# Patient Record
Sex: Male | Born: 1960
Health system: Southern US, Community
[De-identification: ages and names within clinical notes are randomized; demographics above are authoritative.]

## PROBLEM LIST (undated history)

## (undated) ENCOUNTER — Emergency Department: Admission: EM | Payer: Medicare Other

## (undated) DIAGNOSIS — F32A Depression, unspecified: Secondary | ICD-10-CM

## (undated) DIAGNOSIS — I10 Essential (primary) hypertension: Secondary | ICD-10-CM

## (undated) DIAGNOSIS — I639 Cerebral infarction, unspecified: Secondary | ICD-10-CM

## (undated) DIAGNOSIS — F1011 Alcohol abuse, in remission: Secondary | ICD-10-CM

## (undated) DIAGNOSIS — E669 Obesity, unspecified: Secondary | ICD-10-CM

## (undated) DIAGNOSIS — K760 Fatty (change of) liver, not elsewhere classified: Secondary | ICD-10-CM

## (undated) DIAGNOSIS — F329 Major depressive disorder, single episode, unspecified: Secondary | ICD-10-CM

## (undated) DIAGNOSIS — Z809 Family history of malignant neoplasm, unspecified: Secondary | ICD-10-CM

## (undated) DIAGNOSIS — I2699 Other pulmonary embolism without acute cor pulmonale: Secondary | ICD-10-CM

## (undated) DIAGNOSIS — Z720 Tobacco use: Secondary | ICD-10-CM

## (undated) DIAGNOSIS — D75839 Thrombocytosis, unspecified: Secondary | ICD-10-CM

## (undated) DIAGNOSIS — R591 Generalized enlarged lymph nodes: Secondary | ICD-10-CM

## (undated) DIAGNOSIS — D689 Coagulation defect, unspecified: Secondary | ICD-10-CM

## (undated) DIAGNOSIS — Z86718 Personal history of other venous thrombosis and embolism: Secondary | ICD-10-CM

## (undated) DIAGNOSIS — M1990 Unspecified osteoarthritis, unspecified site: Secondary | ICD-10-CM

## (undated) DIAGNOSIS — M1712 Unilateral primary osteoarthritis, left knee: Secondary | ICD-10-CM

## (undated) DIAGNOSIS — D696 Thrombocytopenia, unspecified: Secondary | ICD-10-CM

## (undated) DIAGNOSIS — Z7901 Long term (current) use of anticoagulants: Secondary | ICD-10-CM

## (undated) DIAGNOSIS — I6529 Occlusion and stenosis of unspecified carotid artery: Secondary | ICD-10-CM

## (undated) DIAGNOSIS — F10929 Alcohol use, unspecified with intoxication, unspecified: Secondary | ICD-10-CM

## (undated) DIAGNOSIS — F419 Anxiety disorder, unspecified: Secondary | ICD-10-CM

## (undated) DIAGNOSIS — Z87820 Personal history of traumatic brain injury: Secondary | ICD-10-CM

## (undated) DIAGNOSIS — R748 Abnormal levels of other serum enzymes: Secondary | ICD-10-CM

## (undated) DIAGNOSIS — N2889 Other specified disorders of kidney and ureter: Secondary | ICD-10-CM

## (undated) DIAGNOSIS — J449 Chronic obstructive pulmonary disease, unspecified: Secondary | ICD-10-CM

## (undated) DIAGNOSIS — F1911 Other psychoactive substance abuse, in remission: Secondary | ICD-10-CM

## (undated) DIAGNOSIS — C649 Malignant neoplasm of unspecified kidney, except renal pelvis: Secondary | ICD-10-CM

## (undated) DIAGNOSIS — K759 Inflammatory liver disease, unspecified: Secondary | ICD-10-CM

## (undated) DIAGNOSIS — S066XAA Traumatic subarachnoid hemorrhage with loss of consciousness status unknown, initial encounter: Secondary | ICD-10-CM

## (undated) DIAGNOSIS — C801 Malignant (primary) neoplasm, unspecified: Secondary | ICD-10-CM

## (undated) DIAGNOSIS — R7303 Prediabetes: Secondary | ICD-10-CM

## (undated) HISTORY — PX: ANKLE SURGERY: SHX546

## (undated) HISTORY — PX: ORBITAL FRACTURE SURGERY: SHX725

## (undated) HISTORY — DX: Essential (primary) hypertension: I10

## (undated) HISTORY — DX: Personal history of other venous thrombosis and embolism: Z86.718

## (undated) HISTORY — DX: Other specified disorders of kidney and ureter: N28.89

## (undated) HISTORY — DX: Thrombocytopenia, unspecified: D69.6

## (undated) HISTORY — DX: Generalized enlarged lymph nodes: R59.1

## (undated) HISTORY — DX: Obesity, unspecified: E66.9

## (undated) HISTORY — DX: Other pulmonary embolism without acute cor pulmonale: I26.99

## (undated) HISTORY — DX: Abnormal levels of other serum enzymes: R74.8

## (undated) HISTORY — DX: Traumatic subarachnoid hemorrhage with loss of consciousness status unknown, initial encounter: S06.6XAA

## (undated) HISTORY — DX: Alcohol use, unspecified with intoxication, unspecified: F10.929

## (undated) HISTORY — DX: Long term (current) use of anticoagulants: Z79.01

## (undated) HISTORY — DX: Unilateral primary osteoarthritis, left knee: M17.12

## (undated) HISTORY — DX: Occlusion and stenosis of unspecified carotid artery: I65.29

## (undated) HISTORY — DX: Fatty (change of) liver, not elsewhere classified: K76.0

## (undated) HISTORY — DX: Tobacco use: Z72.0

## (undated) HISTORY — DX: Family history of malignant neoplasm, unspecified: Z80.9

## (undated) HISTORY — DX: Personal history of traumatic brain injury: Z87.820

## (undated) HISTORY — PX: HERNIA REPAIR: SHX51

---

## 2009-03-30 ENCOUNTER — Ambulatory Visit: Payer: Self-pay | Admitting: Gastroenterology

## 2012-12-28 HISTORY — PX: ANKLE FRACTURE SURGERY: SHX122

## 2013-01-09 ENCOUNTER — Inpatient Hospital Stay: Payer: Self-pay | Admitting: Orthopedic Surgery

## 2013-01-09 LAB — BASIC METABOLIC PANEL
Anion Gap: 10 (ref 7–16)
BUN: 17 mg/dL (ref 7–18)
Calcium, Total: 8 mg/dL — ABNORMAL LOW (ref 8.5–10.1)
Chloride: 106 mmol/L (ref 98–107)
Co2: 23 mmol/L (ref 21–32)
Creatinine: 1.09 mg/dL (ref 0.60–1.30)
EGFR (African American): >60
EGFR (Non-African Amer.): >60
Glucose: 106 mg/dL — ABNORMAL HIGH (ref 65–99)
Osmolality: 280 (ref 275–301)
Potassium: 3.8 mmol/L (ref 3.5–5.1)
Sodium: 139 mmol/L (ref 136–145)

## 2013-01-09 LAB — DRUG SCREEN, URINE
Amphetamines, Ur Screen: NEGATIVE (ref ?–1000)
Barbiturates, Ur Screen: NEGATIVE (ref ?–200)
Benzodiazepine, Ur Scrn: NEGATIVE (ref ?–200)
Cannabinoid 50 Ng, Ur ~~LOC~~: NEGATIVE (ref ?–50)
Cocaine Metabolite,Ur ~~LOC~~: POSITIVE (ref ?–300)
MDMA (Ecstasy)Ur Screen: NEGATIVE (ref ?–500)
Methadone, Ur Screen: NEGATIVE (ref ?–300)
Opiate, Ur Screen: POSITIVE (ref ?–300)
Phencyclidine (PCP) Ur S: NEGATIVE (ref ?–25)
Tricyclic, Ur Screen: NEGATIVE (ref ?–1000)

## 2013-01-09 LAB — CBC WITH DIFFERENTIAL/PLATELET
Basophil #: 0.1 10*3/uL (ref 0.0–0.1)
Basophil %: 0.4 %
Eosinophil #: 0 10*3/uL (ref 0.0–0.7)
Eosinophil %: 0.4 %
HCT: 45.6 % (ref 40.0–52.0)
HGB: 15.2 g/dL (ref 13.0–18.0)
Lymphocyte #: 1.7 10*3/uL (ref 1.0–3.6)
Lymphocyte %: 13.5 %
MCH: 31.9 pg (ref 26.0–34.0)
MCHC: 33.4 g/dL (ref 32.0–36.0)
MCV: 96 fL (ref 80–100)
Monocyte #: 0.7 x10 3/mm (ref 0.2–1.0)
Monocyte %: 5.3 %
Neutrophil #: 10 10*3/uL — ABNORMAL HIGH (ref 1.4–6.5)
Neutrophil %: 80.4 %
Platelet: 186 10*3/uL (ref 150–440)
RBC: 4.77 10*6/uL (ref 4.40–5.90)
RDW: 12.2 % (ref 11.5–14.5)
WBC: 12.4 10*3/uL — ABNORMAL HIGH (ref 3.8–10.6)

## 2013-01-09 LAB — URINALYSIS, COMPLETE
Bacteria: NONE SEEN
Bilirubin,UR: NEGATIVE
Blood: NEGATIVE
Glucose,UR: NEGATIVE mg/dL (ref 0–75)
Hyaline Cast: 3
Leukocyte Esterase: NEGATIVE
Nitrite: NEGATIVE
Ph: 5 (ref 4.5–8.0)
Protein: NEGATIVE
RBC,UR: <1 /HPF (ref 0–5)
Specific Gravity: 1.017 (ref 1.003–1.030)
Squamous Epithelial: NONE SEEN
WBC UR: <1 /HPF (ref 0–5)

## 2013-01-09 LAB — APTT: Activated PTT: 29.7 secs (ref 23.6–35.9)

## 2013-01-09 LAB — PROTIME-INR
INR: 1
Prothrombin Time: 13.4 secs (ref 11.5–14.7)

## 2013-02-12 ENCOUNTER — Inpatient Hospital Stay: Payer: Self-pay | Admitting: Internal Medicine

## 2013-02-12 LAB — COMPREHENSIVE METABOLIC PANEL
Albumin: 3.6 g/dL (ref 3.4–5.0)
Alkaline Phosphatase: 112 U/L (ref 50–136)
Anion Gap: 6 — ABNORMAL LOW (ref 7–16)
BUN: 12 mg/dL (ref 7–18)
Bilirubin,Total: 2.5 mg/dL — ABNORMAL HIGH (ref 0.2–1.0)
Calcium, Total: 8.8 mg/dL (ref 8.5–10.1)
Chloride: 104 mmol/L (ref 98–107)
Co2: 25 mmol/L (ref 21–32)
Creatinine: 1.04 mg/dL (ref 0.60–1.30)
EGFR (African American): >60
EGFR (Non-African Amer.): >60
Glucose: 121 mg/dL — ABNORMAL HIGH (ref 65–99)
Osmolality: 271 (ref 275–301)
Potassium: 3.7 mmol/L (ref 3.5–5.1)
SGOT(AST): 14 U/L — ABNORMAL LOW (ref 15–37)
SGPT (ALT): 32 U/L (ref 12–78)
Sodium: 135 mmol/L — ABNORMAL LOW (ref 136–145)
Total Protein: 7.6 g/dL (ref 6.4–8.2)

## 2013-02-12 LAB — CBC
HCT: 45.3 % (ref 40.0–52.0)
HGB: 15.5 g/dL (ref 13.0–18.0)
MCH: 31.7 pg (ref 26.0–34.0)
MCHC: 34.1 g/dL (ref 32.0–36.0)
MCV: 93 fL (ref 80–100)
Platelet: 142 10*3/uL — ABNORMAL LOW (ref 150–440)
RBC: 4.88 10*6/uL (ref 4.40–5.90)
RDW: 12.5 % (ref 11.5–14.5)
WBC: 13.9 10*3/uL — ABNORMAL HIGH (ref 3.8–10.6)

## 2013-02-12 LAB — PROTIME-INR
INR: 1.1
Prothrombin Time: 14 secs (ref 11.5–14.7)

## 2013-02-12 LAB — TROPONIN I: Troponin-I: <0.02 ng/mL

## 2013-02-12 LAB — APTT: Activated PTT: 33.7 secs (ref 23.6–35.9)

## 2013-02-13 LAB — CBC WITH DIFFERENTIAL/PLATELET
Basophil #: 0 10*3/uL (ref 0.0–0.1)
Basophil %: 0.3 %
Eosinophil #: 0.1 10*3/uL (ref 0.0–0.7)
Eosinophil %: 1.1 %
HCT: 41.4 % (ref 40.0–52.0)
HGB: 14.5 g/dL (ref 13.0–18.0)
Lymphocyte #: 1.6 10*3/uL (ref 1.0–3.6)
Lymphocyte %: 15.9 %
MCH: 32.8 pg (ref 26.0–34.0)
MCHC: 34.9 g/dL (ref 32.0–36.0)
MCV: 94 fL (ref 80–100)
Monocyte #: 1 x10 3/mm (ref 0.2–1.0)
Monocyte %: 10.2 %
Neutrophil #: 7.4 10*3/uL — ABNORMAL HIGH (ref 1.4–6.5)
Neutrophil %: 72.5 %
Platelet: 119 10*3/uL — ABNORMAL LOW (ref 150–440)
RBC: 4.41 10*6/uL (ref 4.40–5.90)
RDW: 12.4 % (ref 11.5–14.5)
WBC: 10.2 10*3/uL (ref 3.8–10.6)

## 2013-02-13 LAB — PROTIME-INR
INR: 1.1
Prothrombin Time: 14.4 secs (ref 11.5–14.7)

## 2013-02-13 LAB — BASIC METABOLIC PANEL
Anion Gap: 7 (ref 7–16)
BUN: 13 mg/dL (ref 7–18)
Calcium, Total: 8.9 mg/dL (ref 8.5–10.1)
Chloride: 99 mmol/L (ref 98–107)
Co2: 26 mmol/L (ref 21–32)
Creatinine: 0.93 mg/dL (ref 0.60–1.30)
Glucose: 97 mg/dL (ref 65–99)
Osmolality: 265 (ref 275–301)
Potassium: 3.6 mmol/L (ref 3.5–5.1)
Sodium: 132 mmol/L — ABNORMAL LOW (ref 136–145)

## 2013-02-14 LAB — PLATELET COUNT: Platelet: 119 10*3/uL — ABNORMAL LOW (ref 150–440)

## 2013-02-14 LAB — PROTIME-INR
INR: 1.2
Prothrombin Time: 14.9 secs — ABNORMAL HIGH (ref 11.5–14.7)

## 2013-02-15 LAB — PROTIME-INR
INR: 1.6
Prothrombin Time: 18.8 secs — ABNORMAL HIGH (ref 11.5–14.7)

## 2013-02-16 LAB — PROTIME-INR
INR: 2.1
Prothrombin Time: 23.5 secs — ABNORMAL HIGH (ref 11.5–14.7)

## 2013-02-16 LAB — HEMOGLOBIN: HGB: 14 g/dL (ref 13.0–18.0)

## 2013-02-18 LAB — CULTURE, BLOOD (SINGLE)

## 2013-02-23 ENCOUNTER — Emergency Department: Payer: Self-pay | Admitting: Emergency Medicine

## 2013-02-23 LAB — COMPREHENSIVE METABOLIC PANEL
Albumin: 3.4 g/dL (ref 3.4–5.0)
Alkaline Phosphatase: 96 U/L (ref 50–136)
Anion Gap: 6 — ABNORMAL LOW (ref 7–16)
BUN: 11 mg/dL (ref 7–18)
Bilirubin,Total: 0.2 mg/dL (ref 0.2–1.0)
Calcium, Total: 9.3 mg/dL (ref 8.5–10.1)
Chloride: 108 mmol/L — ABNORMAL HIGH (ref 98–107)
Co2: 25 mmol/L (ref 21–32)
Creatinine: 0.81 mg/dL (ref 0.60–1.30)
EGFR (African American): >60
EGFR (Non-African Amer.): >60
Glucose: 105 mg/dL — ABNORMAL HIGH (ref 65–99)
Osmolality: 277 (ref 275–301)
Potassium: 4.4 mmol/L (ref 3.5–5.1)
SGOT(AST): 24 U/L (ref 15–37)
SGPT (ALT): 55 U/L (ref 12–78)
Sodium: 139 mmol/L (ref 136–145)
Total Protein: 8 g/dL (ref 6.4–8.2)

## 2013-02-23 LAB — CBC WITH DIFFERENTIAL/PLATELET
Basophil #: 0.1 10*3/uL (ref 0.0–0.1)
Basophil %: 1.1 %
Eosinophil #: 0.2 10*3/uL (ref 0.0–0.7)
Eosinophil %: 2.8 %
HCT: 45.6 % (ref 40.0–52.0)
HGB: 15.7 g/dL (ref 13.0–18.0)
Lymphocyte #: 1.7 10*3/uL (ref 1.0–3.6)
Lymphocyte %: 24.7 %
MCH: 31.8 pg (ref 26.0–34.0)
MCHC: 34.3 g/dL (ref 32.0–36.0)
MCV: 93 fL (ref 80–100)
Monocyte #: 0.4 x10 3/mm (ref 0.2–1.0)
Monocyte %: 5.9 %
Neutrophil #: 4.6 10*3/uL (ref 1.4–6.5)
Neutrophil %: 65.5 %
Platelet: 423 10*3/uL (ref 150–440)
RBC: 4.93 10*6/uL (ref 4.40–5.90)
RDW: 12.7 % (ref 11.5–14.5)
WBC: 7.1 10*3/uL (ref 3.8–10.6)

## 2013-02-23 LAB — PROTIME-INR
INR: 4.5
Prothrombin Time: 40.7 secs — ABNORMAL HIGH (ref 11.5–14.7)

## 2013-02-23 LAB — APTT: Activated PTT: 71.5 secs — ABNORMAL HIGH (ref 23.6–35.9)

## 2013-03-03 ENCOUNTER — Ambulatory Visit: Payer: Self-pay

## 2013-06-29 DIAGNOSIS — N2889 Other specified disorders of kidney and ureter: Secondary | ICD-10-CM

## 2013-06-29 DIAGNOSIS — I2699 Other pulmonary embolism without acute cor pulmonale: Secondary | ICD-10-CM

## 2013-06-29 DIAGNOSIS — Z86718 Personal history of other venous thrombosis and embolism: Secondary | ICD-10-CM

## 2013-06-29 HISTORY — DX: Other specified disorders of kidney and ureter: N28.89

## 2013-06-29 HISTORY — DX: Other pulmonary embolism without acute cor pulmonale: I26.99

## 2013-06-29 HISTORY — DX: Personal history of other venous thrombosis and embolism: Z86.718

## 2013-06-29 HISTORY — PX: ROBOTIC ASSITED PARTIAL NEPHRECTOMY: SHX6087

## 2013-07-24 ENCOUNTER — Emergency Department: Payer: Self-pay | Admitting: Emergency Medicine

## 2013-07-24 LAB — CBC WITH DIFFERENTIAL/PLATELET
Basophil #: 0 10*3/uL (ref 0.0–0.1)
Basophil %: 0.6 %
Eosinophil #: 0.1 10*3/uL (ref 0.0–0.7)
Eosinophil %: 1.5 %
HCT: 41.2 % (ref 40.0–52.0)
HGB: 14.4 g/dL (ref 13.0–18.0)
Lymphocyte #: 1.5 10*3/uL (ref 1.0–3.6)
Lymphocyte %: 17.8 %
MCH: 33.4 pg (ref 26.0–34.0)
MCHC: 35 g/dL (ref 32.0–36.0)
MCV: 95 fL (ref 80–100)
Monocyte #: 0.6 x10 3/mm (ref 0.2–1.0)
Monocyte %: 7.8 %
Neutrophil #: 5.9 10*3/uL (ref 1.4–6.5)
Neutrophil %: 72.3 %
Platelet: 137 10*3/uL — ABNORMAL LOW (ref 150–440)
RBC: 4.32 10*6/uL — ABNORMAL LOW (ref 4.40–5.90)
RDW: 12.1 % (ref 11.5–14.5)
WBC: 8.2 10*3/uL (ref 3.8–10.6)

## 2013-07-24 LAB — COMPREHENSIVE METABOLIC PANEL
Albumin: 3.1 g/dL — ABNORMAL LOW (ref 3.4–5.0)
Alkaline Phosphatase: 70 U/L (ref 50–136)
Anion Gap: 8 (ref 7–16)
BUN: 12 mg/dL (ref 7–18)
Bilirubin,Total: 0.9 mg/dL (ref 0.2–1.0)
Calcium, Total: 8.5 mg/dL (ref 8.5–10.1)
Chloride: 103 mmol/L (ref 98–107)
Co2: 25 mmol/L (ref 21–32)
Creatinine: 1.29 mg/dL (ref 0.60–1.30)
EGFR (African American): >60
EGFR (Non-African Amer.): >60
Glucose: 211 mg/dL — ABNORMAL HIGH (ref 65–99)
Osmolality: 278 (ref 275–301)
Potassium: 3.8 mmol/L (ref 3.5–5.1)
SGOT(AST): 59 U/L — ABNORMAL HIGH (ref 15–37)
SGPT (ALT): 62 U/L (ref 12–78)
Sodium: 136 mmol/L (ref 136–145)
Total Protein: 6.9 g/dL (ref 6.4–8.2)

## 2013-07-24 LAB — PROTIME-INR
INR: 1
Prothrombin Time: 13.2 secs (ref 11.5–14.7)

## 2013-08-09 ENCOUNTER — Emergency Department: Payer: Self-pay | Admitting: Emergency Medicine

## 2013-08-09 LAB — COMPREHENSIVE METABOLIC PANEL
Albumin: 3.6 g/dL (ref 3.4–5.0)
Alkaline Phosphatase: 90 U/L (ref 50–136)
Anion Gap: 9 (ref 7–16)
BUN: 13 mg/dL (ref 7–18)
Bilirubin,Total: 0.5 mg/dL (ref 0.2–1.0)
Calcium, Total: 8.5 mg/dL (ref 8.5–10.1)
Chloride: 106 mmol/L (ref 98–107)
Co2: 24 mmol/L (ref 21–32)
Creatinine: 1.04 mg/dL (ref 0.60–1.30)
EGFR (African American): >60
EGFR (Non-African Amer.): >60
Glucose: 179 mg/dL — ABNORMAL HIGH (ref 65–99)
Osmolality: 282 (ref 275–301)
Potassium: 3.7 mmol/L (ref 3.5–5.1)
SGOT(AST): 42 U/L — ABNORMAL HIGH (ref 15–37)
SGPT (ALT): 63 U/L (ref 12–78)
Sodium: 139 mmol/L (ref 136–145)
Total Protein: 7.5 g/dL (ref 6.4–8.2)

## 2013-08-09 LAB — TROPONIN I: Troponin-I: <0.02 ng/mL

## 2013-08-09 LAB — CK TOTAL AND CKMB (NOT AT ARMC)
CK, Total: 41 U/L (ref 35–232)
CK-MB: 1 ng/mL (ref 0.5–3.6)

## 2013-08-09 LAB — PROTIME-INR
INR: 1
Prothrombin Time: 13.3 secs (ref 11.5–14.7)

## 2013-08-09 LAB — CBC
HCT: 45.6 % (ref 40.0–52.0)
HGB: 15.6 g/dL (ref 13.0–18.0)
MCH: 32.6 pg (ref 26.0–34.0)
MCHC: 34.3 g/dL (ref 32.0–36.0)
MCV: 95 fL (ref 80–100)
Platelet: 207 10*3/uL (ref 150–440)
RBC: 4.8 10*6/uL (ref 4.40–5.90)
RDW: 12.1 % (ref 11.5–14.5)
WBC: 7.9 10*3/uL (ref 3.8–10.6)

## 2013-08-09 LAB — ETHANOL
Ethanol %: 0.168 % — ABNORMAL HIGH (ref 0.000–0.080)
Ethanol: 168 mg/dL

## 2013-08-09 LAB — PRO B NATRIURETIC PEPTIDE: B-Type Natriuretic Peptide: 67 pg/mL (ref 0–125)

## 2013-08-09 LAB — APTT: Activated PTT: 29.7 secs (ref 23.6–35.9)

## 2013-08-10 LAB — TROPONIN I: Troponin-I: <0.02 ng/mL

## 2013-08-14 ENCOUNTER — Emergency Department: Payer: Self-pay | Admitting: Emergency Medicine

## 2013-08-15 LAB — DRUG SCREEN, URINE

## 2013-08-15 LAB — COMPREHENSIVE METABOLIC PANEL
Albumin: 3.5 g/dL (ref 3.4–5.0)
Alkaline Phosphatase: 92 U/L (ref 50–136)
Anion Gap: 11 (ref 7–16)
BUN: 16 mg/dL (ref 7–18)
Bilirubin,Total: 0.5 mg/dL (ref 0.2–1.0)
Calcium, Total: 8.5 mg/dL (ref 8.5–10.1)
Chloride: 105 mmol/L (ref 98–107)
Co2: 24 mmol/L (ref 21–32)
Creatinine: 1.06 mg/dL (ref 0.60–1.30)
EGFR (African American): >60
EGFR (Non-African Amer.): >60
Glucose: 110 mg/dL — ABNORMAL HIGH (ref 65–99)
Osmolality: 281 (ref 275–301)
Potassium: 3.7 mmol/L (ref 3.5–5.1)
SGOT(AST): 37 U/L (ref 15–37)
SGPT (ALT): 58 U/L (ref 12–78)
Sodium: 140 mmol/L (ref 136–145)
Total Protein: 7.3 g/dL (ref 6.4–8.2)

## 2013-08-15 LAB — ACETAMINOPHEN LEVEL: Acetaminophen: <2 ug/mL

## 2013-08-15 LAB — CBC
HCT: 43.8 % (ref 40.0–52.0)
HGB: 15.2 g/dL (ref 13.0–18.0)
MCH: 33.1 pg (ref 26.0–34.0)
MCHC: 34.7 g/dL (ref 32.0–36.0)
MCV: 95 fL (ref 80–100)
Platelet: 162 10*3/uL (ref 150–440)
RBC: 4.6 10*6/uL (ref 4.40–5.90)
RDW: 12.2 % (ref 11.5–14.5)
WBC: 4.9 10*3/uL (ref 3.8–10.6)

## 2013-08-15 LAB — URINALYSIS, COMPLETE
Bacteria: NONE SEEN
Bilirubin,UR: NEGATIVE
Glucose,UR: NEGATIVE mg/dL (ref 0–75)
Ketone: NEGATIVE
Leukocyte Esterase: NEGATIVE
Nitrite: NEGATIVE
Ph: 5 (ref 4.5–8.0)
Protein: NEGATIVE
RBC,UR: <1 /HPF (ref 0–5)
Specific Gravity: 1.008 (ref 1.003–1.030)
Squamous Epithelial: NONE SEEN
WBC UR: <1 /HPF (ref 0–5)

## 2013-08-15 LAB — ETHANOL
Ethanol %: 0.132 % — ABNORMAL HIGH (ref 0.000–0.080)
Ethanol: 132 mg/dL

## 2013-08-15 LAB — PROTIME-INR
INR: 1
Prothrombin Time: 13.2 secs (ref 11.5–14.7)

## 2013-08-15 LAB — TSH: Thyroid Stimulating Horm: 4.55 u[IU]/mL — ABNORMAL HIGH

## 2013-08-15 LAB — SALICYLATE LEVEL: Salicylates, Serum: 2.6 mg/dL

## 2013-11-27 DIAGNOSIS — R748 Abnormal levels of other serum enzymes: Secondary | ICD-10-CM

## 2013-11-27 HISTORY — DX: Abnormal levels of other serum enzymes: R74.8

## 2013-12-07 ENCOUNTER — Inpatient Hospital Stay: Payer: Self-pay | Admitting: Internal Medicine

## 2013-12-07 LAB — CBC
HCT: 46.9 % (ref 40.0–52.0)
HGB: 15.6 g/dL (ref 13.0–18.0)
MCH: 32.2 pg (ref 26.0–34.0)
MCHC: 33.3 g/dL (ref 32.0–36.0)
MCV: 97 fL (ref 80–100)
Platelet: 161 10*3/uL (ref 150–440)
RBC: 4.85 10*6/uL (ref 4.40–5.90)
RDW: 12.3 % (ref 11.5–14.5)
WBC: 7.6 10*3/uL (ref 3.8–10.6)

## 2013-12-07 LAB — BASIC METABOLIC PANEL
Anion Gap: 12 (ref 7–16)
BUN: 8 mg/dL (ref 7–18)
Calcium, Total: 8.6 mg/dL (ref 8.5–10.1)
Chloride: 101 mmol/L (ref 98–107)
Co2: 24 mmol/L (ref 21–32)
Creatinine: 1.08 mg/dL (ref 0.60–1.30)
EGFR (African American): >60
EGFR (Non-African Amer.): >60
Glucose: 122 mg/dL — ABNORMAL HIGH (ref 65–99)
Osmolality: 273 (ref 275–301)
Potassium: 3.5 mmol/L (ref 3.5–5.1)
Sodium: 137 mmol/L (ref 136–145)

## 2013-12-07 LAB — APTT: Activated PTT: 31.9 secs (ref 23.6–35.9)

## 2013-12-07 LAB — TROPONIN I: Troponin-I: <0.02 ng/mL

## 2013-12-08 LAB — CBC WITH DIFFERENTIAL/PLATELET
Basophil #: 0 10*3/uL (ref 0.0–0.1)
Basophil %: 0.7 %
Eosinophil #: 0.2 10*3/uL (ref 0.0–0.7)
Eosinophil %: 3.5 %
HCT: 42.3 % (ref 40.0–52.0)
HGB: 14.3 g/dL (ref 13.0–18.0)
Lymphocyte #: 1.4 10*3/uL (ref 1.0–3.6)
Lymphocyte %: 27.6 %
MCH: 32.7 pg (ref 26.0–34.0)
MCHC: 33.8 g/dL (ref 32.0–36.0)
MCV: 97 fL (ref 80–100)
Monocyte #: 0.4 x10 3/mm (ref 0.2–1.0)
Monocyte %: 7.3 %
Neutrophil #: 3.2 10*3/uL (ref 1.4–6.5)
Neutrophil %: 60.9 %
Platelet: 117 10*3/uL — ABNORMAL LOW (ref 150–440)
RBC: 4.37 10*6/uL — ABNORMAL LOW (ref 4.40–5.90)
RDW: 11.9 % (ref 11.5–14.5)
WBC: 5.2 10*3/uL (ref 3.8–10.6)

## 2013-12-08 LAB — BASIC METABOLIC PANEL
Anion Gap: 6 — ABNORMAL LOW (ref 7–16)
BUN: 9 mg/dL (ref 7–18)
Calcium, Total: 7.5 mg/dL — ABNORMAL LOW (ref 8.5–10.1)
Chloride: 103 mmol/L (ref 98–107)
Co2: 26 mmol/L (ref 21–32)
Creatinine: 0.98 mg/dL (ref 0.60–1.30)
EGFR (African American): >60
EGFR (Non-African Amer.): >60
Glucose: 81 mg/dL (ref 65–99)
Osmolality: 268 (ref 275–301)
Potassium: 3.7 mmol/L (ref 3.5–5.1)
Sodium: 135 mmol/L — ABNORMAL LOW (ref 136–145)

## 2013-12-08 LAB — CK TOTAL AND CKMB (NOT AT ARMC)
CK, Total: 31 U/L — ABNORMAL LOW
CK, Total: 33 U/L — ABNORMAL LOW
CK-MB: 0.7 ng/mL (ref 0.5–3.6)
CK-MB: 0.6 ng/mL (ref 0.5–3.6)

## 2013-12-08 LAB — CK-MB: CK-MB: 0.6 ng/mL (ref 0.5–3.6)

## 2013-12-08 LAB — TROPONIN I
Troponin-I: <0.02 ng/mL
Troponin-I: <0.02 ng/mL
Troponin-I: <0.02 ng/mL

## 2013-12-08 LAB — APTT
Activated PTT: 58.9 secs — ABNORMAL HIGH (ref 23.6–35.9)
Activated PTT: 61.8 secs — ABNORMAL HIGH (ref 23.6–35.9)

## 2014-01-02 ENCOUNTER — Ambulatory Visit: Payer: Self-pay | Admitting: Urgent Care

## 2014-02-08 ENCOUNTER — Emergency Department: Payer: Self-pay | Admitting: Emergency Medicine

## 2014-02-09 ENCOUNTER — Emergency Department: Payer: Self-pay | Admitting: Emergency Medicine

## 2014-02-22 ENCOUNTER — Ambulatory Visit: Payer: Self-pay | Admitting: Family Medicine

## 2014-03-12 ENCOUNTER — Emergency Department: Payer: Self-pay | Admitting: Emergency Medicine

## 2014-03-12 LAB — COMPREHENSIVE METABOLIC PANEL
Albumin: 3.3 g/dL — ABNORMAL LOW (ref 3.4–5.0)
Alkaline Phosphatase: 63 U/L
Anion Gap: 6 — ABNORMAL LOW (ref 7–16)
BUN: 18 mg/dL (ref 7–18)
Bilirubin,Total: 1 mg/dL (ref 0.2–1.0)
Calcium, Total: 8.3 mg/dL — ABNORMAL LOW (ref 8.5–10.1)
Chloride: 105 mmol/L (ref 98–107)
Co2: 24 mmol/L (ref 21–32)
Creatinine: 1.04 mg/dL (ref 0.60–1.30)
EGFR (African American): >60
EGFR (Non-African Amer.): >60
Glucose: 120 mg/dL — ABNORMAL HIGH (ref 65–99)
Osmolality: 273 (ref 275–301)
Potassium: 4.1 mmol/L (ref 3.5–5.1)
SGOT(AST): 120 U/L — ABNORMAL HIGH (ref 15–37)
SGPT (ALT): 182 U/L — ABNORMAL HIGH (ref 12–78)
Sodium: 135 mmol/L — ABNORMAL LOW (ref 136–145)
Total Protein: 7 g/dL (ref 6.4–8.2)

## 2014-03-12 LAB — CK TOTAL AND CKMB (NOT AT ARMC)
CK, Total: 32 U/L — ABNORMAL LOW
CK-MB: 0.6 ng/mL (ref 0.5–3.6)

## 2014-03-12 LAB — CBC
HCT: 49.8 % (ref 40.0–52.0)
HGB: 17 g/dL (ref 13.0–18.0)
MCH: 33.3 pg (ref 26.0–34.0)
MCHC: 34.2 g/dL (ref 32.0–36.0)
MCV: 98 fL (ref 80–100)
Platelet: 158 10*3/uL (ref 150–440)
RBC: 5.1 10*6/uL (ref 4.40–5.90)
RDW: 12.5 % (ref 11.5–14.5)
WBC: 6.6 10*3/uL (ref 3.8–10.6)

## 2014-03-12 LAB — TROPONIN I: Troponin-I: <0.02 ng/mL

## 2014-03-17 ENCOUNTER — Ambulatory Visit: Payer: Self-pay | Admitting: Hematology and Oncology

## 2014-03-29 ENCOUNTER — Ambulatory Visit: Payer: Self-pay | Admitting: Hematology and Oncology

## 2014-05-02 IMAGING — CR DG CHEST 2V
1 series · 3 of 3 positions shown · non-contrast
Comparison: none

REASON FOR EXAM: COUGHING UP BLOOD AND RONCHI
COMMENTS:

[Series 1: w chest pa · 0.14mm/px · 3 of 3 slices shown]
[im 1/3]
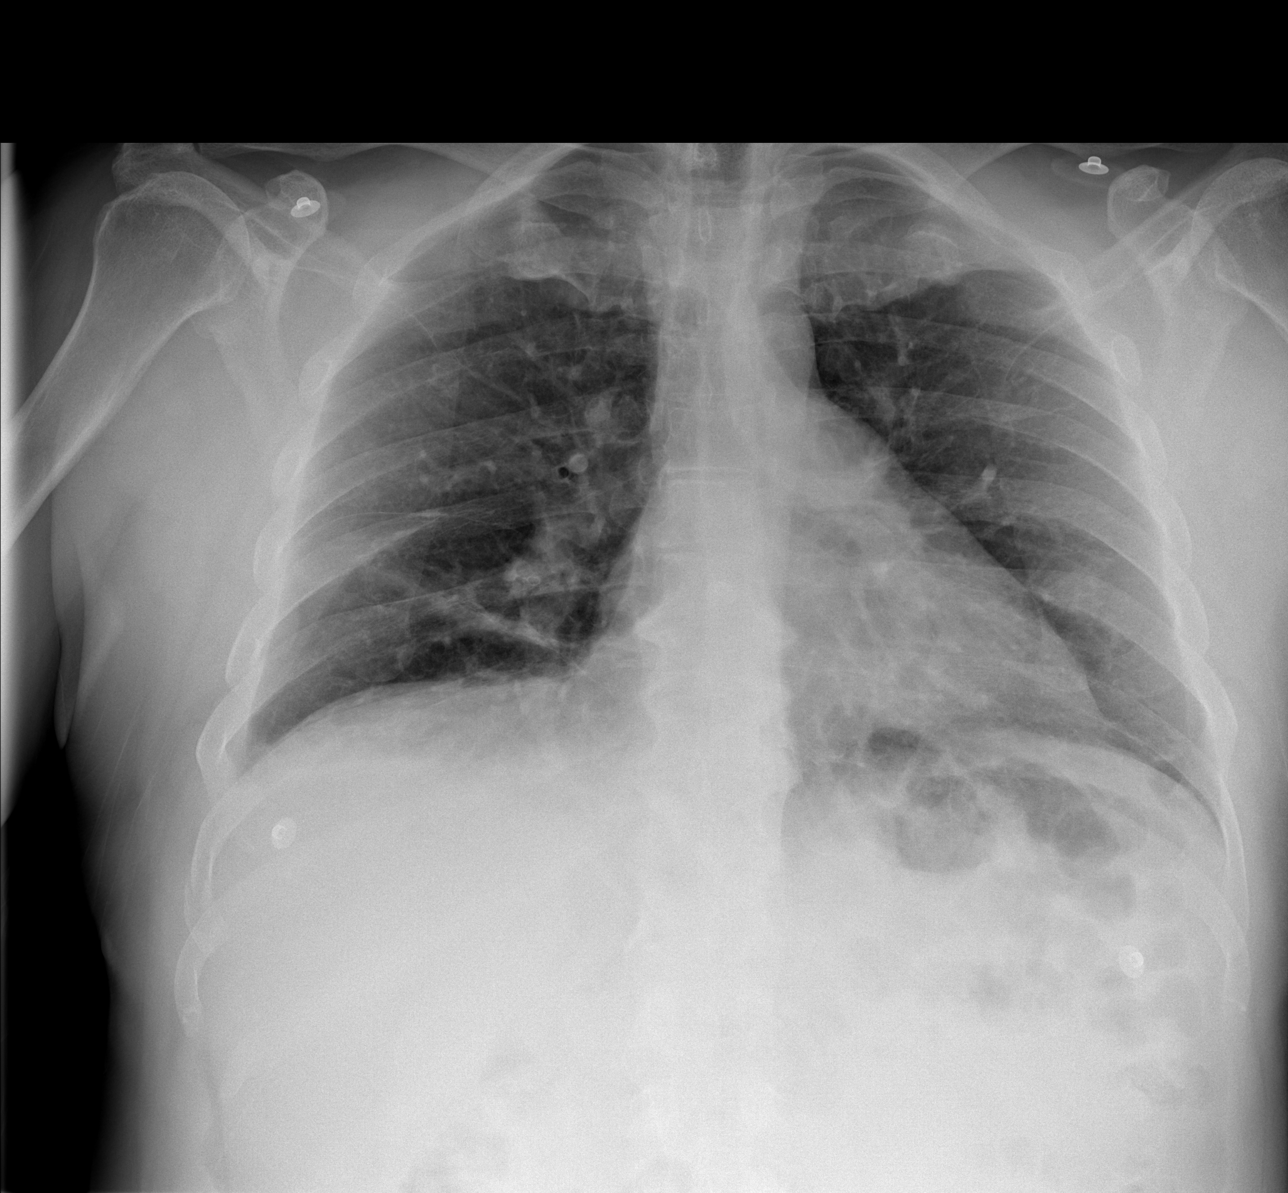
[im 2/3]
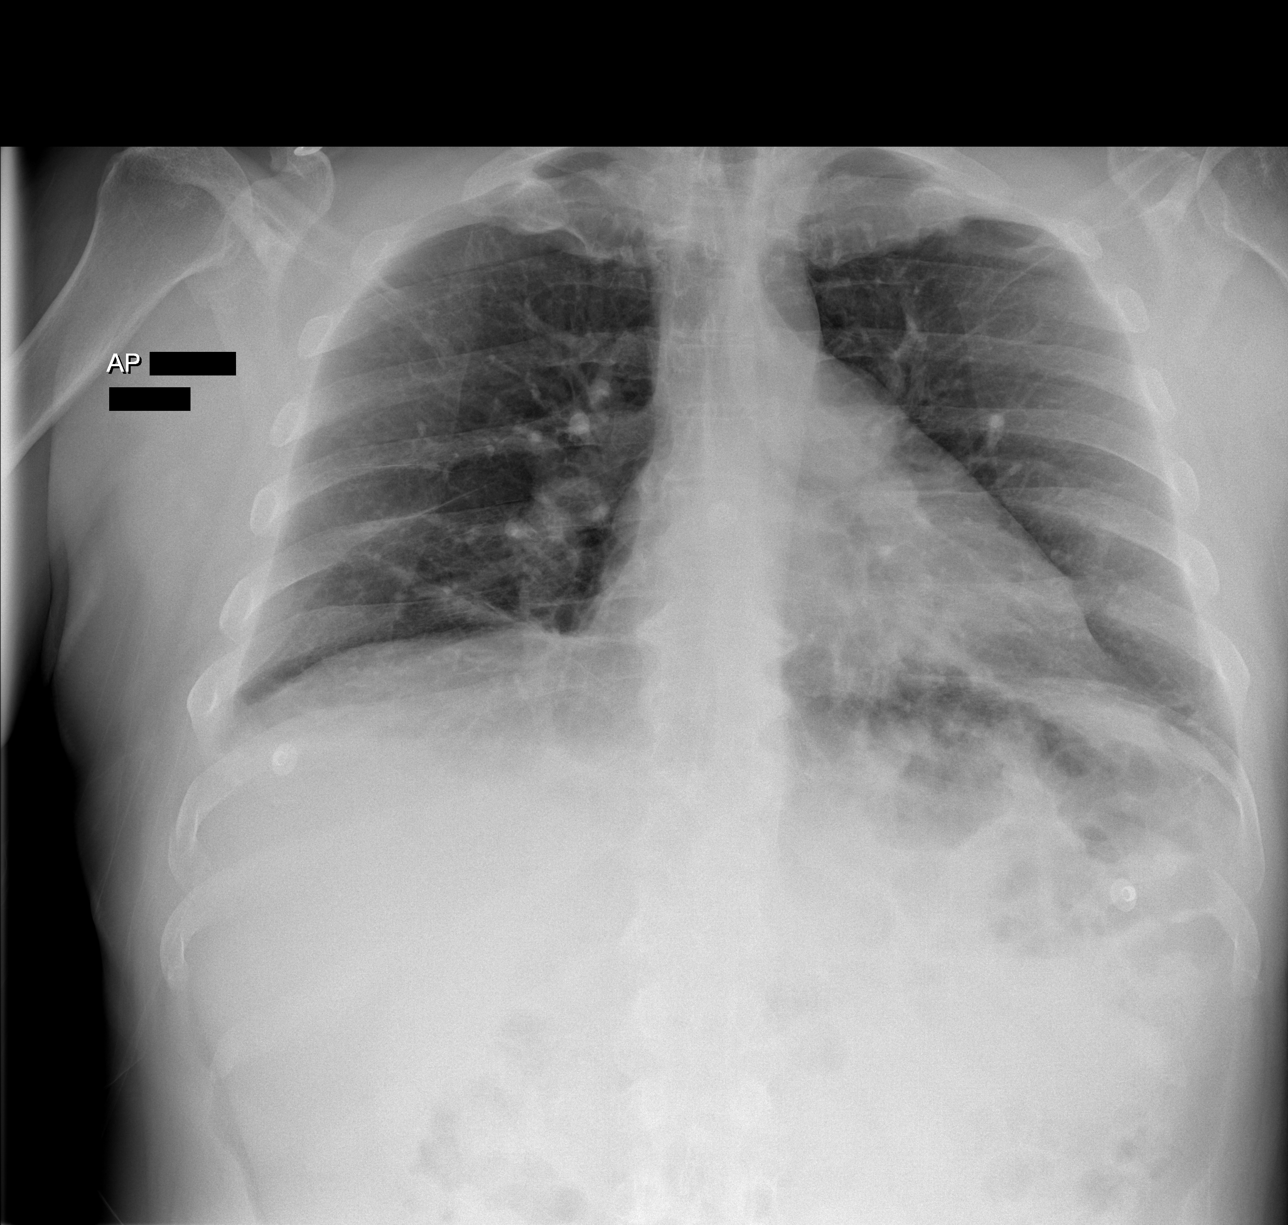
[im 3/3]
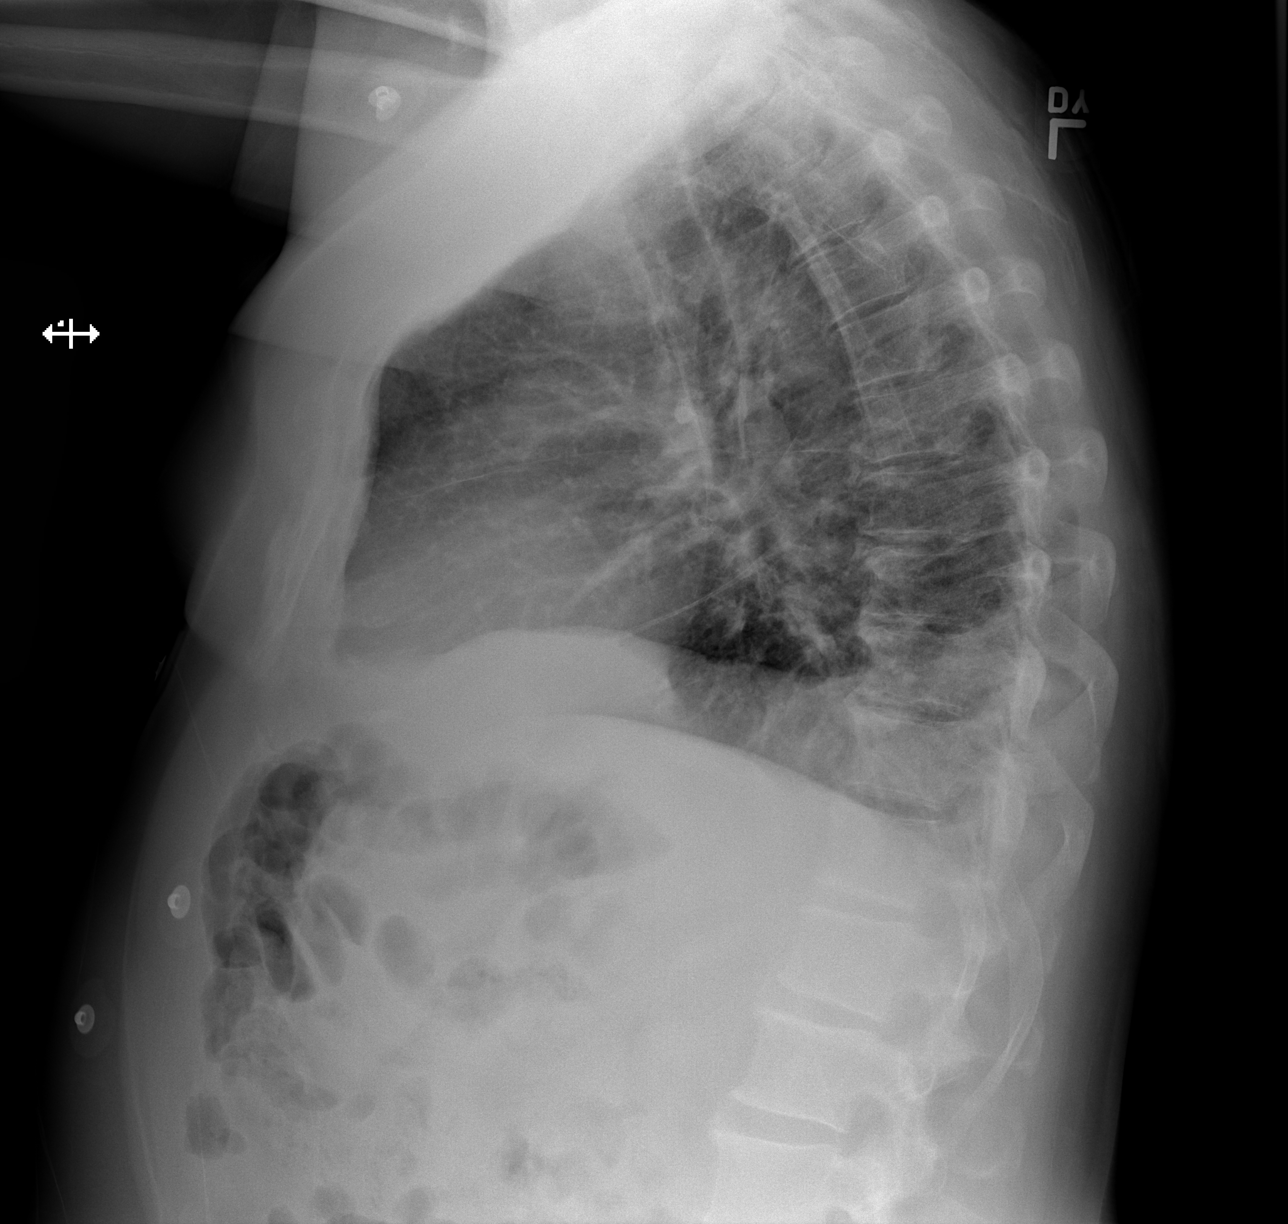

[3 of 3 positions shown; findings below may reference images not displayed]

PROCEDURE:     DXR - DXR CHEST PA (OR AP) AND LATERAL  - February 12, 2013  [DATE]

RESULT:     Comparison is made to the study January 09, 2013.

The lungs are hypoinflated. There is atelectasis at the right lung base.
There is a small pleural effusion on the right layering posteriorly and
laterally. The cardiac silhouette is top normal in size though stable. The
pulmonary vascularity is not engorged. There is no alveolar infiltrate.
There are mild degenerative disc changes of the thoracic spine. The
visualized portions of the ribs appear intact with the exception of the
anteriolateral aspect of the right eighth rib where a minimally displaced
fracture is demonstrated.
IMPRESSION: 1. There is a minimally displaced fracture of the anteriolateral aspect of
the left eighth rib.
2. There is basilar atelectasis as well as a small pleural effusion on the
right.
3. There is mild bilateral hypoinflation.
4. There is no evidence of CHF.

[REDACTED]

## 2014-05-02 IMAGING — CT CT CHEST W/ CM
1 series · 15 of 34 positions shown, 19 images · IV contrast (APPLIED)
Comparison: none

REASON FOR EXAM: HEMOPTYSIS
COMMENTS:

[Series 4: soft tissue · axial · 0.74mm/px · z∈[-832,-589]mm · 15 of 97 slices shown, 19 images]
[im 8/97  mediastinal]
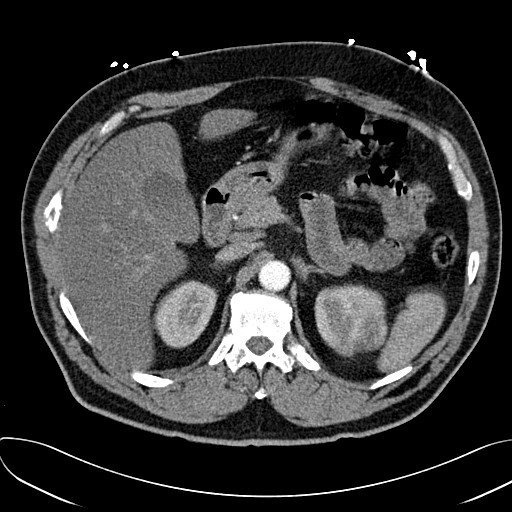
[im 8/97  lung]
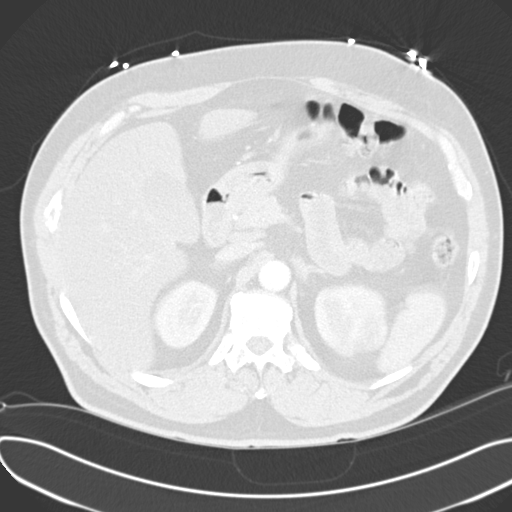
[im 15/97  lung]
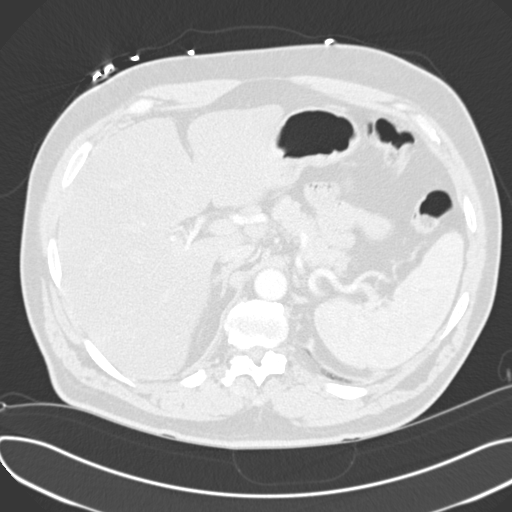
[im 20/97  lung]
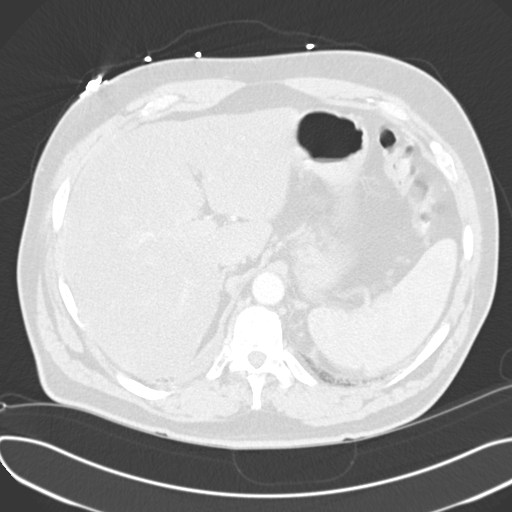
[im 25/97  lung]
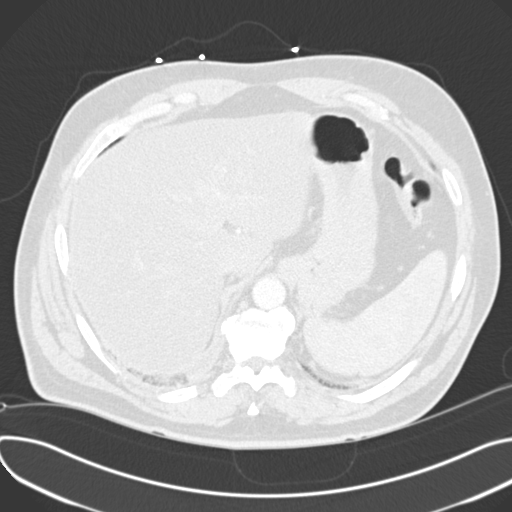
[im 33/97  mediastinal]
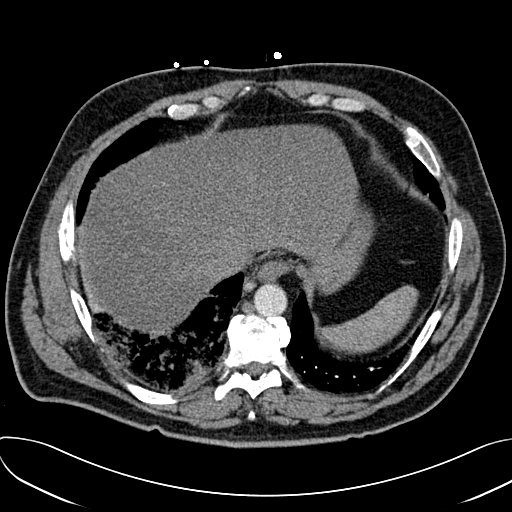
[im 33/97  lung]
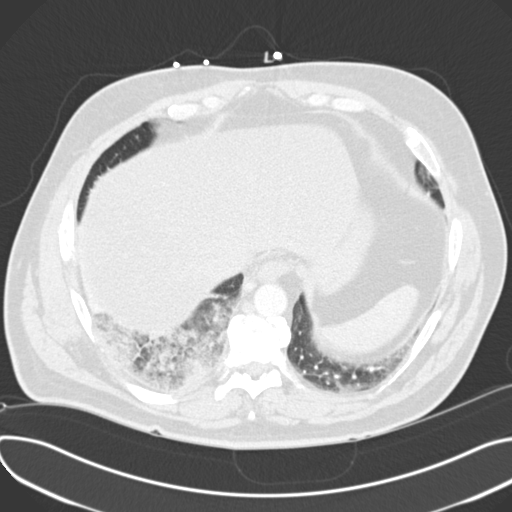
[im 39/97  lung]
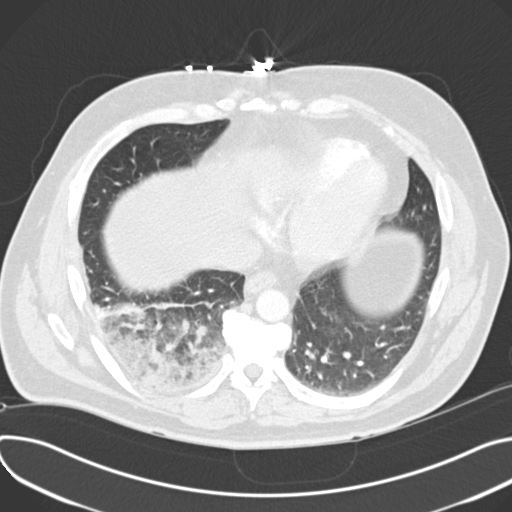
[im 43/97  lung]
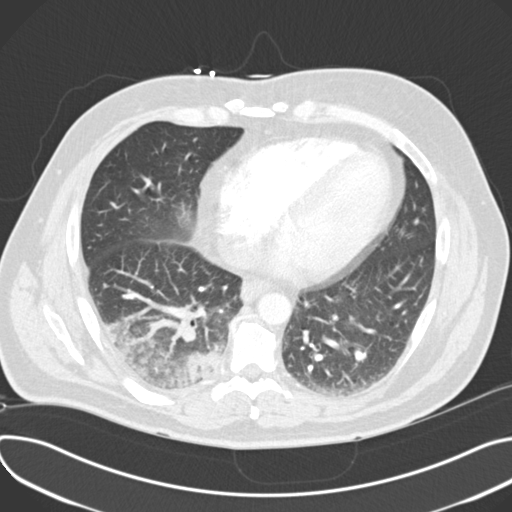
[im 50/97  lung]
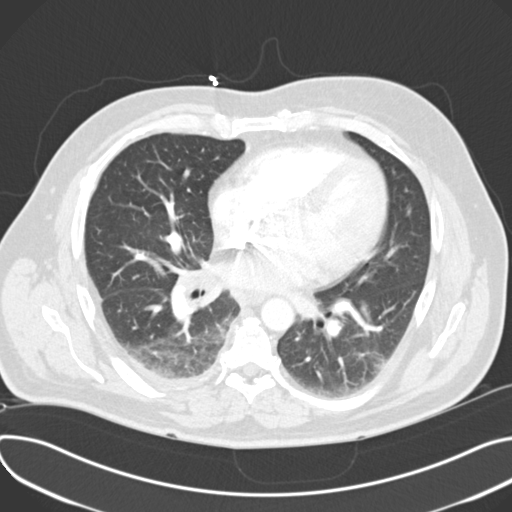
[im 54/97  mediastinal]
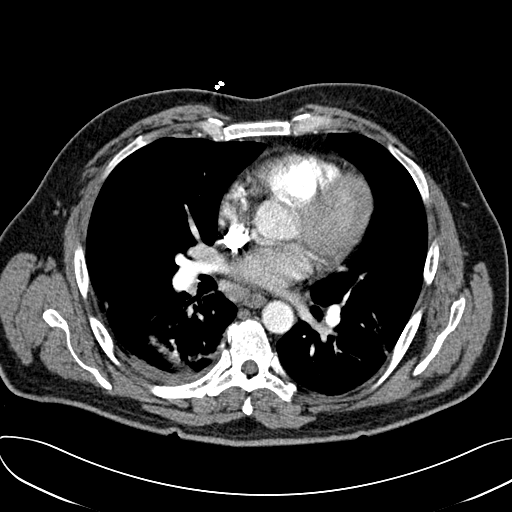
[im 54/97  lung]
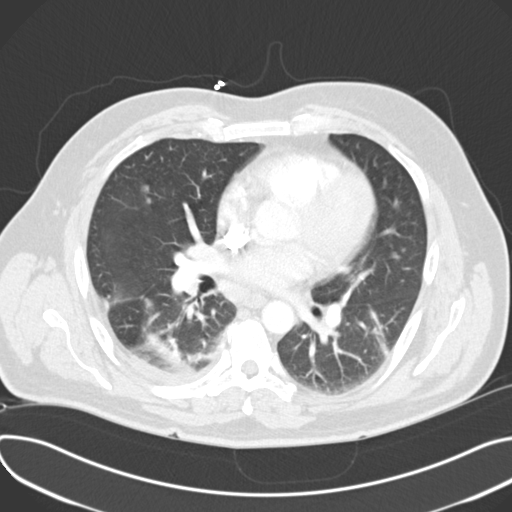
[im 58/97  lung]
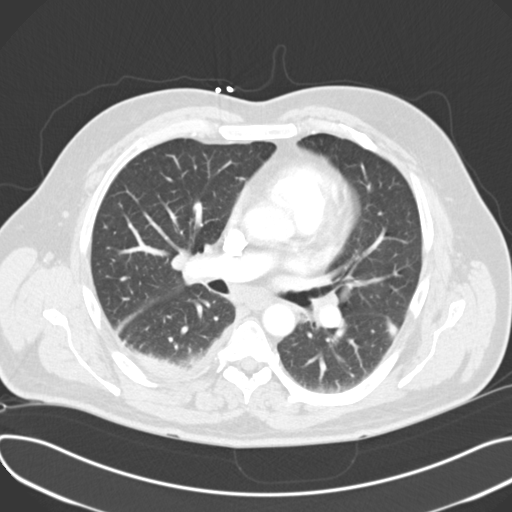
[im 65/97  lung]
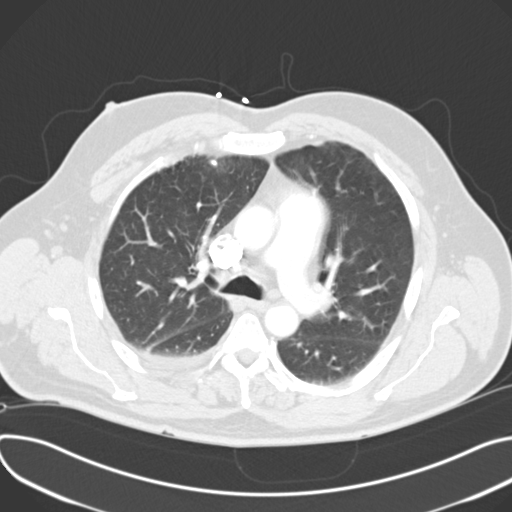
[im 72/97  lung]
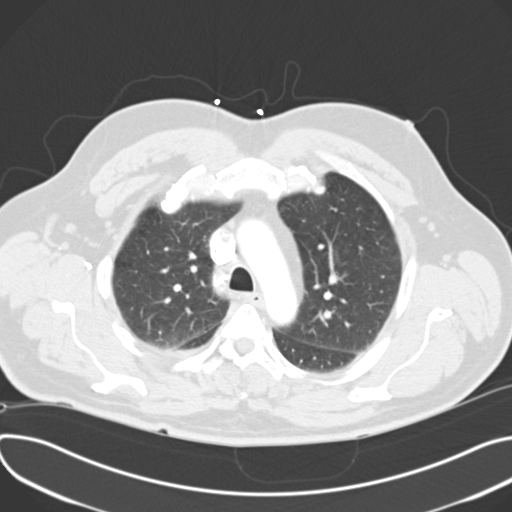
[im 77/97  mediastinal]
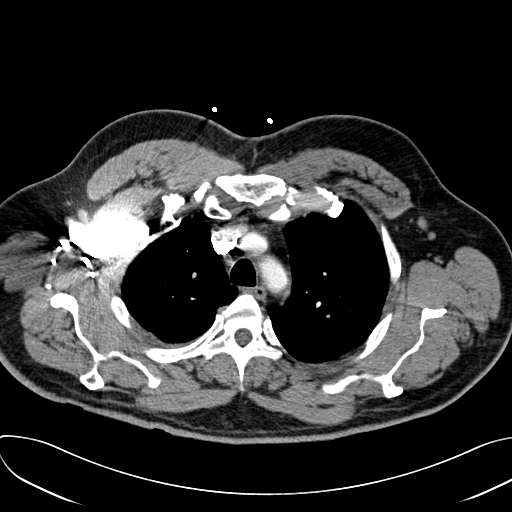
[im 77/97  lung]
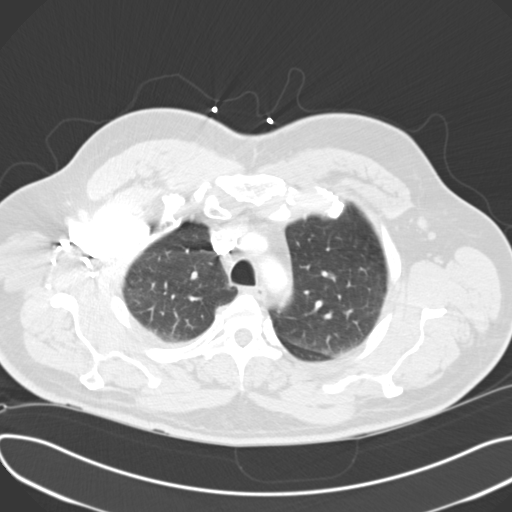
[im 82/97  lung]
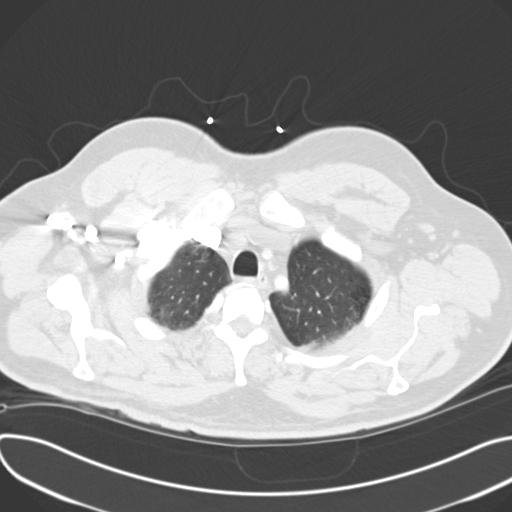
[im 89/97  lung]
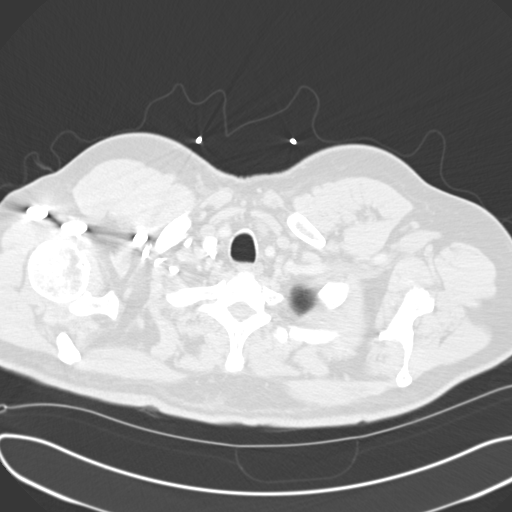

[15 of 34 positions shown; findings below may reference images not displayed]

PROCEDURE:     CT  - CT CHEST (FOR PE) W  - February 12, 2013  [DATE]

RESULT:     Axial CT scanning was performed through the chest with
reconstructions at 3 mm intervals and slice thicknesses. Review of
multiplanar reconstructed images was performed separately on the VIA
monitor. Comparison is made to the chest x-ray of today's date.

There are filling defects within branches of the right lower lobe pulmonary
arteries consistent with pulmonary emboli. I do not see definite upper lobe
emboli on the right nor evidence of emboli on the left. There is parenchymal
consolidation on the right that may reflect atelectasis or infarction. There
is a small amount of pleural fluid present on the right. There are fractures
of the right fourth, fifth, sixth, and eighth ribs laterally.

The left lung exhibits no parenchymal consolidation. The cardiac chambers
are normal top normal in size. The caliber of the thoracic aorta is normal.
There are mildly enlarged right hilar as well as subcarinal lymph nodes.
There are are normal sized AP window lymph nodes. The thoracic esophagus is
normal in caliber.

Within the upper abdomen the liver exhibits decreased density consistent
with fatty infiltration. There is no evidence of a parenchymal or
subcapsular hemorrhage in the visualized portions of the organ. The spleen
and adrenal glands are normal in appearance. There is a complex appearance
of the mid to upper pole of the left kidney which may reflect an abnormal
mass measuring 2.5 x 3 cm.
IMPRESSION: 1. There are pulmonary emboli within branches of the right lower lobe
pulmonary arteries. There is increased density posteriorly in the right
lower lobe which may reflect infarction or pneumonia.
2. There are enlarged right hilar and subcarinal lymph nodes.
3. There are fractures of the fourth through sixth as well as the eighth
ribs laterally on the right. There is no pneumothorax, but there is a small
right pleural effusion.
4. There are fatty infiltrative changes of the liver. There is suspicious
mass in the mid to upper pole of the left kidney. This when he evaluation
once the patient has recovered from his acute condition.

[REDACTED]

## 2014-05-21 IMAGING — CR DG KNEE 1-2V*L*
1 series · 2 of 2 positions shown · non-contrast
Comparison: none

REASON FOR EXAM: arthritis in feet/legs/hands
COMMENTS:

[Series 1: ap · 0.17mm/px · 2 of 2 slices shown]
[im 1/2]
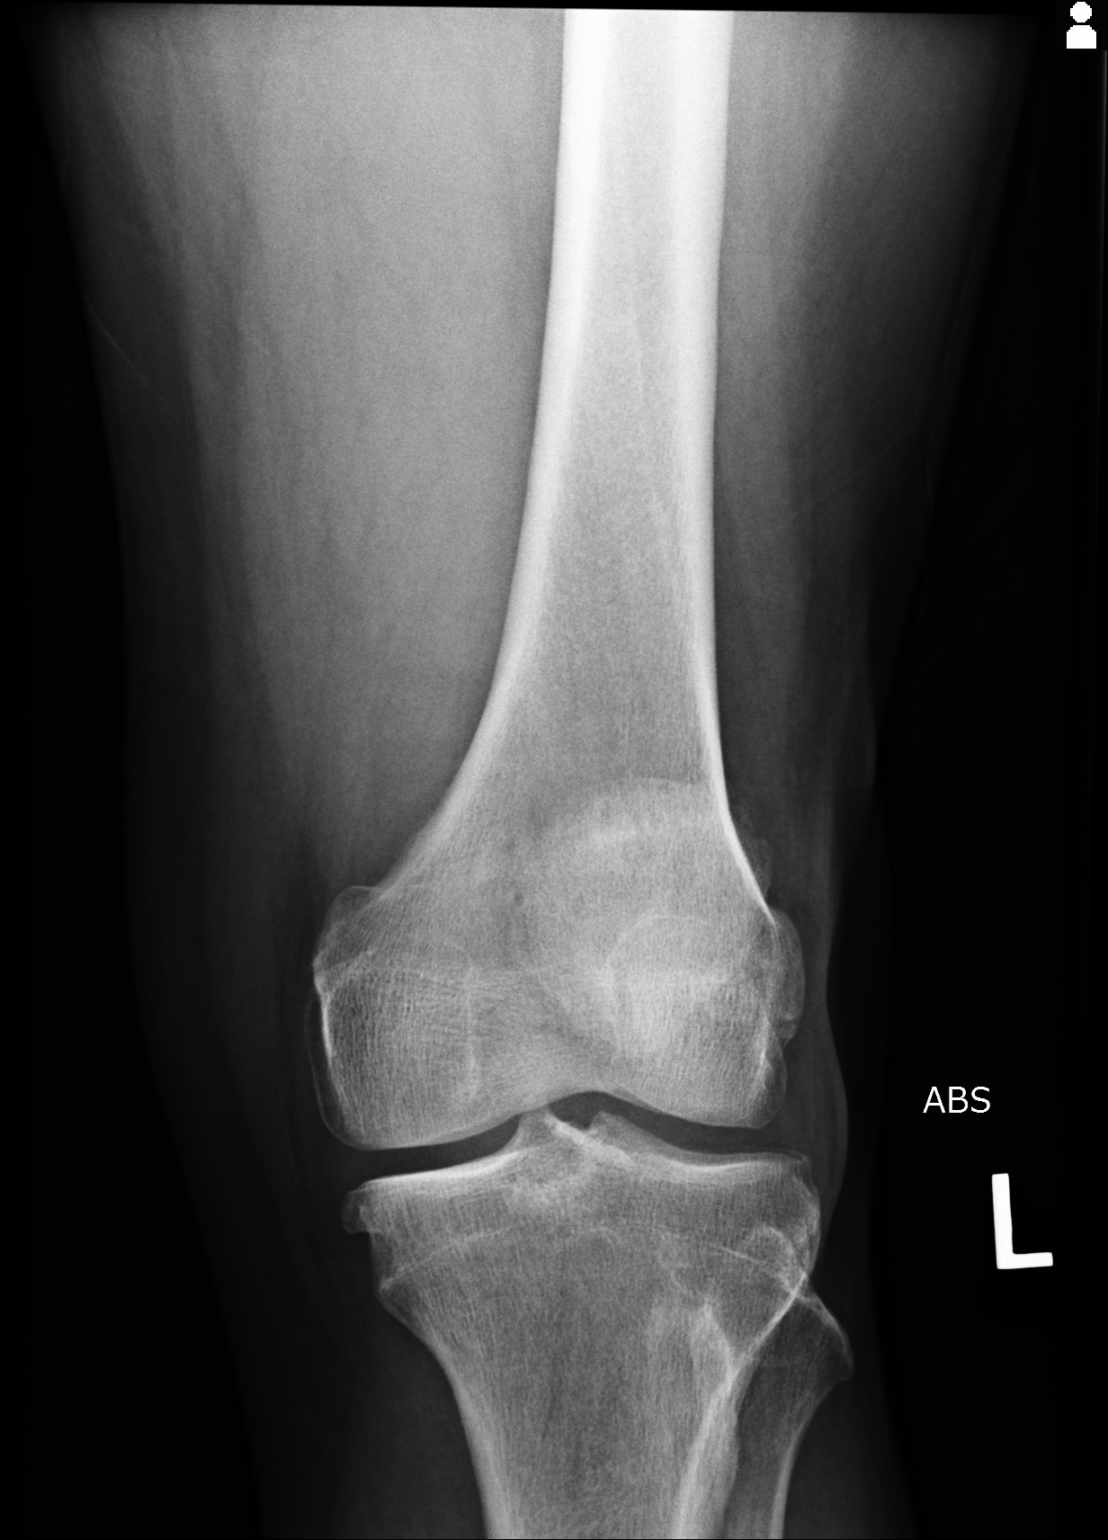
[im 2/2]
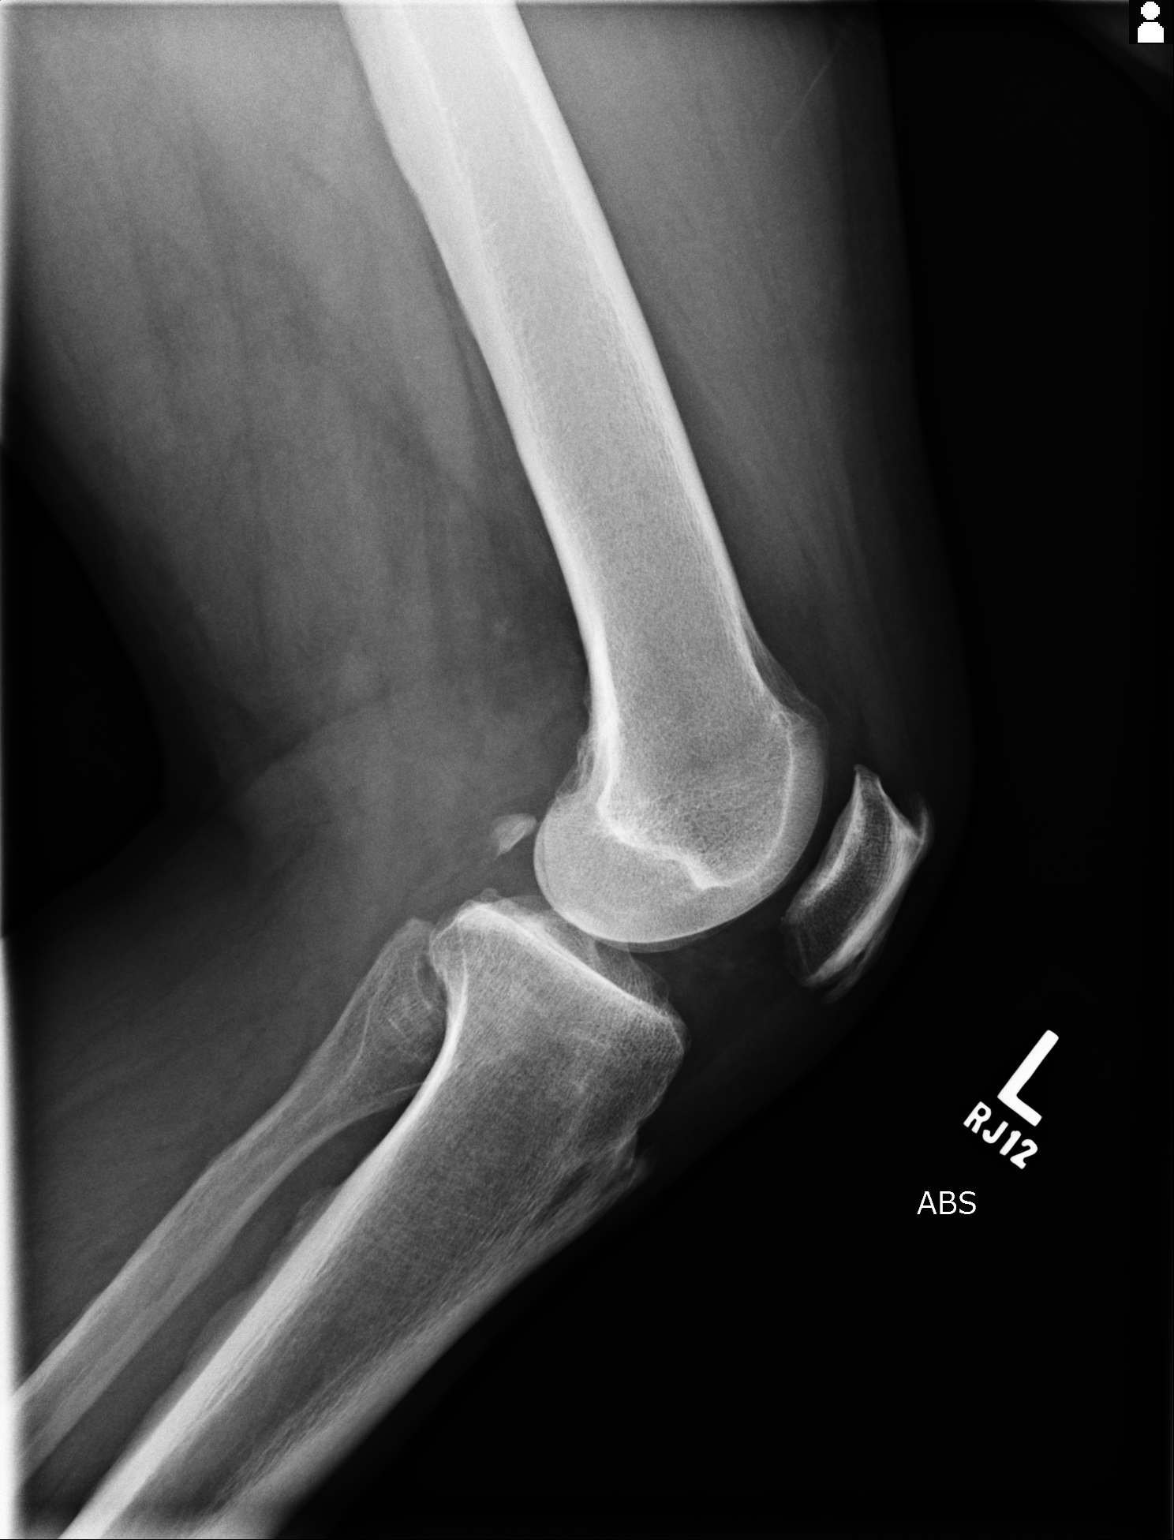

[2 of 2 positions shown; findings below may reference images not displayed]

PROCEDURE:     DXR - DXR KNEE LEFT AP AND LATERAL  - March 03, 2013 [DATE]

RESULT:     AP and lateral views of the left knee reveal the bones to be
reasonably well mineralized. The medial and lateral joint compartments are
preserved. There is beaking of the tibial spines. There are very mild
degenerative changes of the patellofemoral joint. There is no evidence of a
joint effusion.
IMPRESSION: There are mild degenerative changes of the left knee.

[REDACTED]

## 2014-06-06 DIAGNOSIS — J449 Chronic obstructive pulmonary disease, unspecified: Secondary | ICD-10-CM | POA: Insufficient documentation

## 2014-06-26 ENCOUNTER — Emergency Department: Payer: Self-pay | Admitting: Emergency Medicine

## 2014-06-27 LAB — DRUG SCREEN, URINE

## 2014-06-27 LAB — URINALYSIS, COMPLETE
Bacteria: NONE SEEN
Bilirubin,UR: NEGATIVE
Blood: NEGATIVE
Glucose,UR: >=500 mg/dL (ref 0–75)
Leukocyte Esterase: NEGATIVE
Nitrite: NEGATIVE
Ph: 5 (ref 4.5–8.0)
Protein: NEGATIVE
RBC,UR: <1 /HPF (ref 0–5)
Specific Gravity: 1.015 (ref 1.003–1.030)
Squamous Epithelial: NONE SEEN
WBC UR: 1 /HPF (ref 0–5)

## 2014-06-27 LAB — COMPREHENSIVE METABOLIC PANEL
Albumin: 3.4 g/dL (ref 3.4–5.0)
Alkaline Phosphatase: 69 U/L
Anion Gap: 10 (ref 7–16)
BUN: 7 mg/dL (ref 7–18)
Bilirubin,Total: 0.4 mg/dL (ref 0.2–1.0)
Calcium, Total: 8 mg/dL — ABNORMAL LOW (ref 8.5–10.1)
Chloride: 107 mmol/L (ref 98–107)
Co2: 23 mmol/L (ref 21–32)
Creatinine: 1.02 mg/dL (ref 0.60–1.30)
EGFR (African American): >60
EGFR (Non-African Amer.): >60
Glucose: 256 mg/dL — ABNORMAL HIGH (ref 65–99)
Osmolality: 286 (ref 275–301)
Potassium: 3.5 mmol/L (ref 3.5–5.1)
SGOT(AST): 33 U/L (ref 15–37)
SGPT (ALT): 75 U/L — ABNORMAL HIGH
Sodium: 140 mmol/L (ref 136–145)
Total Protein: 7.2 g/dL (ref 6.4–8.2)

## 2014-06-27 LAB — CBC
HCT: 47 % (ref 40.0–52.0)
HGB: 15.9 g/dL (ref 13.0–18.0)
MCH: 33.6 pg (ref 26.0–34.0)
MCHC: 33.8 g/dL (ref 32.0–36.0)
MCV: 99 fL (ref 80–100)
Platelet: 171 10*3/uL (ref 150–440)
RBC: 4.74 10*6/uL (ref 4.40–5.90)
RDW: 12 % (ref 11.5–14.5)
WBC: 5.8 10*3/uL (ref 3.8–10.6)

## 2014-06-27 LAB — ACETAMINOPHEN LEVEL: Acetaminophen: <2 ug/mL

## 2014-06-27 LAB — PROTIME-INR
INR: 1.4
Prothrombin Time: 16.8 secs — ABNORMAL HIGH (ref 11.5–14.7)

## 2014-06-27 LAB — ETHANOL: Ethanol: 219 mg/dL

## 2014-06-27 LAB — SALICYLATE LEVEL: Salicylates, Serum: 2 mg/dL

## 2014-08-07 ENCOUNTER — Emergency Department: Payer: Self-pay | Admitting: Emergency Medicine

## 2014-08-08 LAB — COMPREHENSIVE METABOLIC PANEL
Albumin: 3.6 g/dL (ref 3.4–5.0)
Alkaline Phosphatase: 74 U/L
Anion Gap: 12 (ref 7–16)
BUN: 15 mg/dL (ref 7–18)
Bilirubin,Total: 0.5 mg/dL (ref 0.2–1.0)
Calcium, Total: 8 mg/dL — ABNORMAL LOW (ref 8.5–10.1)
Chloride: 96 mmol/L — ABNORMAL LOW (ref 98–107)
Co2: 26 mmol/L (ref 21–32)
Creatinine: 1.33 mg/dL — ABNORMAL HIGH (ref 0.60–1.30)
EGFR (African American): >60
EGFR (Non-African Amer.): 59 — ABNORMAL LOW
Glucose: 277 mg/dL — ABNORMAL HIGH (ref 65–99)
Osmolality: 279 (ref 275–301)
Potassium: 3.8 mmol/L (ref 3.5–5.1)
SGOT(AST): 62 U/L — ABNORMAL HIGH (ref 15–37)
SGPT (ALT): 124 U/L — ABNORMAL HIGH
Sodium: 134 mmol/L — ABNORMAL LOW (ref 136–145)
Total Protein: 7.7 g/dL (ref 6.4–8.2)

## 2014-08-08 LAB — ACETAMINOPHEN LEVEL: Acetaminophen: <2 ug/mL

## 2014-08-08 LAB — CBC
HCT: 48.6 % (ref 40.0–52.0)
HGB: 16.6 g/dL (ref 13.0–18.0)
MCH: 34.1 pg — ABNORMAL HIGH (ref 26.0–34.0)
MCHC: 34.1 g/dL (ref 32.0–36.0)
MCV: 100 fL (ref 80–100)
Platelet: 139 10*3/uL — ABNORMAL LOW (ref 150–440)
RBC: 4.87 10*6/uL (ref 4.40–5.90)
RDW: 12.1 % (ref 11.5–14.5)
WBC: 6.2 10*3/uL (ref 3.8–10.6)

## 2014-08-08 LAB — DRUG SCREEN, URINE

## 2014-08-08 LAB — ETHANOL: Ethanol: 187 mg/dL

## 2014-08-08 LAB — LIPASE, BLOOD: Lipase: 421 U/L — ABNORMAL HIGH (ref 73–393)

## 2014-08-08 LAB — TSH: Thyroid Stimulating Horm: 4.05 u[IU]/mL

## 2014-08-08 LAB — SALICYLATE LEVEL: Salicylates, Serum: 1.8 mg/dL

## 2014-11-27 ENCOUNTER — Ambulatory Visit: Payer: Self-pay | Admitting: Family Medicine

## 2014-12-07 ENCOUNTER — Ambulatory Visit
Admit: 2014-12-07 | Disposition: A | Discharge status: Home / Self Care | Payer: Self-pay | Attending: Family Medicine | Admitting: Family Medicine

## 2014-12-28 ENCOUNTER — Ambulatory Visit
Admit: 2014-12-28 | Disposition: A | Discharge status: Home / Self Care | Payer: Self-pay | Attending: Neurology | Admitting: Neurology
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
## 2015-01-19 NOTE — Discharge Summary (Signed)
PATIENT NAME:  Randall Harvey, Randall Harvey MR#:  [id number] DATE OF BIRTH:  [birth year]  DATE OF ADMISSION:  02/12/2013 DATE OF DISCHARGE:  02/16/2013  PRIMARY CARE PHYSICIAN: Will likely be at the [facility].   FINAL DIAGNOSES: 1.  Pulmonary embolism with pulmonary infarct with hemoptysis and deep vein thrombosis of the right leg.  2.  Recent fracture of the ankle in cast and nonweightbearing of the right leg.  3.  Rib fracture from fall.  4.  Leukocytosis.  5.  Anxiety.  6.  Kidney mass.   MEDICATIONS ON DISCHARGE:  Include: 1.  MS Contin 15 mg every 12 hours. 2.  Oxycodone 10 mg every 4 hours as needed for pain. 3.  Coumadin 10 mg every day at 11:00 a.m. 4.  Xanax 0.25 mg every 8 hours as needed anxiety. 5.  Cefuroxime 500 mg every 12 hours for 6 days. 6.  Colace 100 mg twice a day as needed for constipation. 7.  Nicotine patch 14 mg chest wall daily.   DIET:  Low-sodium diet, regular consistency.   ACTIVITY:  As tolerated. Needs to follow up at [facility] urology for kidney mass.   FOLLOWUP:  One week at the [facility]. Other options could be at the [facility], [facility] or even Dr. Rance Muir office who Randall Harvey has seen in the past.   The patient needs a Coumadin check weekly, preferably as soon as possible.   HOSPITAL COURSE:  The patient was admitted 02/02/2013 and discharged 02/16/2013. The patient came in with pleuritic chest pain, shortness of breath, coughing up blood.  HISTORY OF PRESENT ILLNESS: A 54 year old man history of right ankle fracture status post fixation April 15, started having hemoptysis, hurts when Randall Harvey takes a deep breath, is coughing up blood. Randall Harvey was found to have a CAT scan showing pulmonary emboli, hemoptysis secondary to the pulmonary infarct. The patient was put on Lovenox full dose and Coumadin was started. The patient did have leukocytosis and patient was empirically put on antibiotics. The patient will follow up with Dr. Mack Guise for the right  ankle fracture.  LABORATORY AND RADIOLOGICAL DATA DURING THE HOSPITAL COURSE: Included an INR of 1.1. troponin negative. Glucose 121, BUN 12, creatinine 1.04, sodium 135, potassium 3.7, chloride 104, CO2 25, calcium 8.8. Liver function tests: Total bilirubin up at 2.5. Other liver function tests normal range. White blood cell count 13.9, H and H 15.5 and 45.3, platelet count of 142.   Chest x-ray:  Minimal displacement of anterior aspect of the left 8th.  CT scan of the chest showed pulmonary emboli within the branches of the right lower lobe pulmonary arteries, increased density posteriorly in the right lower lobe, which may reflect infarction or pneumonia, enlarged right hilar and subcarinal lymph nodes, fracture of the 4th through 5th, as well as 8th ribs laterally on the right. No pneumothorax. Small right pleural effusion. Fatty infiltrations of the liver. Suspicious mass mid to upper pole of the left kidney.  EKG showed a sinus tachycardia, incomplete right bundle branch block.  Blood cultures no growth 36 hours.  Ultrasound of the leg showed nonocclusive thrombus in the proximal superficial vein on the right.  Echocardiogram showed an EF of 50% to 55%,. Left atrium is normal in size and structure. Right atrium normal size and structure. Normal heart valves.  Ultrasound of the kidneys showed complex-appearing mixed cystic and solid-appearing mass in the upper pole of the left kidney corresponds to the CT findings; malignancy not  excluded. It measures 2.7 x 2.3 x 3 cm.   INR upon discharge 2.1. Hemoglobin upon discharge 14.  HOSPITAL COURSE PER PROBLEM LIST:  1.  For the patient's pulmonary embolism, pulmonary infarct, hemoptysis and deep vein thrombosis of the right leg: The patient was started on Lovenox high dose 1 mg/kg subcutaneous q. 12 hours and continued that through the entire hospitalization until the patient's Coumadin level came up. The patient initially was started on 7.5 of  Coumadin and increased to 10 mg. His INR upon discharge is 2.1. The patient continued to have hemoptysis during the hospital course, which can happen with pulmonary infarct. The patient does also have rib fractures, which may be complicating things. The patient was kept on anticoagulation. Close clinical followup as outpatient needed. I did speak with Dr. Devona Konig about the patient. Randall Harvey said we can see hemoptysis with the pulmonary infarct and recommended continuing anticoagulation.  Care manager worked hard on discharge plan. Most likely will follow up at the [facility], but Randall Harvey needs to bring in his tax return. If not, Randall Harvey may have to follow up at [facility], Dr. Jeananne Rama or the [facility]. The patient was explained benefits and risks of anticoagulation and pulmonary embolism. Randall Harvey understands the risks and was discharged home in stable condition. 2.  Recent ankle fracture in cast and nonweightbearing of the right leg. The patient has followup with Dr. Mack Guise.  3.  Rib fractures from fall. The patient was kept in the hospital during the entire course for his pulmonary embolus because Randall Harvey is a high-risk patient. The patient is having pain from the ribs given MS Contin and OxyContin upon going home.  4.  Leukocytosis. The patient was given Zithromax and Rocephin and was switched over to Ceftin upon discharge; finished the Zithromax course in the hospital. 5.  Anxiety. Randall Harvey is on p.r.n. Xanax.  6.  Kidney mass. Unsure what this is; but since Randall Harvey has a pulmonary embolus and needing urgent anticoagulation, unable to evaluate this at this time. I did speak with Dr. Bernardo Heater, urology who said Randall Harvey refers all kidney masses over to [facility]. The patient understands that this cannot be left alone and needs to be followed, so Randall Harvey will have to follow up there.  TIME SPENT ON DISCHARGE: 40 minutes  The patient was advised to come back to the Emergency Room for increasing hemoptysis large amounts or any further  bleeding.     ____________________________ Tana Conch. Leslye Peer, MD rjw:[id number] D: 02/16/2013 15:08:32 ET T: 02/16/2013 16:22:25 ET JOB#: [id number]  cc: Tana Conch. Leslye Peer, MD, <Dictator> [facility] Guadalupe Maple, MD Timoteo Gaul, MD  Marisue Brooklyn MD ELECTRONICALLY SIGNED 02/18/2013 13:33

## 2015-01-19 NOTE — H&P (Signed)
PATIENT NAME:  Randall Harvey, Randall Harvey MR#:  [id number] DATE OF BIRTH:  01-28-1961  DATE OF ADMISSION:  02/12/2013  PRIMARY CARE PHYSICIAN: Guadalupe Maple, MD  EMERGENCY ROOM PHYSICIAN: Jon Gills. Lord, MD  CHIEF COMPLAINT: Pleuritic chest pain, shortness of breath and coughing up blood.   HISTORY OF PRESENT ILLNESS: The patient is a 54 year old male patient with recent history of right ankle fracture, status post fixation on April 15, on aspirin 325 mg for DVT prophylaxis. Started to have hemoptysis since last night. The patient also has right-sided chest pain when he takes deep breaths, associated with shortness of breath. The patient came to the Emergency Room for further evaluation. In the ER, CT chest showed pulmonary emboli on the right side, so we were asked to admit the patient. The patient was discharged on April 15 by Dr. Mack Guise, went home, was using crutches and then after 3 days, the patient had a fall and he suffered rib fractures. The patient has pain on the right side of the chest and it is more when he takes deep breaths. Since last night started to have coughing of blood associated with more chest pain on the right side and trouble breathing. The patient denies any recent travel. The patient also is depressed about  recent separation of his wife like a week ago. Denies any fever but does have sweating since last night. No nausea, no vomiting, no diarrhea, no abdominal pain. No other complaints.   PAST MEDICAL HISTORY: Recent surgery for ankle fracture on the right ankle and also history of arthritis. No other medical problems.   ALLERGIES: No known allergies.   SOCIAL HISTORY: Smokes 1/2 pack per day. No alcohol. No drugs.   PAST SURGICAL HISTORY: Surgery for right ankle fracture with open reduction and internal fixation on April 13. No other surgeries.   FAMILY HISTORY: Significant for patient's mother had pancreatic cancer, father had colon cancer and 1 of his sisters  was recently  diagnosed with breast cancer.   MEDICATIONS: The patient is on aspirin 325 mg daily.   REVIEW OF SYSTEMS: CONSTITUTIONAL: Has no fever. No fatigue. Complains of pleuritic chest pain. Denies any weight loss.  EYES: No blurred vision.  EARS, NOSE, THROAT: No tinnitus. No epistaxis. No difficulty swallowing.  RESPIRATORY: Has shortness of breath and painful respirations.  CARDIOVASCULAR: Complains of  right-sided chest pain, more when he takes a deep breath.  GASTROINTESTINAL: No nausea. No vomiting. No abdominal pain.  GENITOURINARY: No dysuria.  ENDOCRINE: No polyuria or nocturia.  INTEGUMENTARY: No skin rashes.  MUSCULOSKELETAL: Right ankle fracture, status post fixation. Right now the patient has a cast. Complains of ribs pain  on the right-sided chest.  NEUROLOGIC: No numbness or weakness.  PSYCHIATRIC: No anxiety. The patient is depressed due to  recent separation from his wife and he is crying.   PHYSICAL EXAMINATION: VITAL SIGNS: Temperature 97.8, heart rate 123, blood pressure 101/72, saturation 97% on room air.  GENERAL: The patient is alert, awake, oriented. Well-nourished male, not in distress, answering questions appropriately.  HEENT: Head atraumatic, normocephalic. Pupils equally reacting to light. Extraocular movements are intact.  EARS, NOSE, THROAT: The patient has no drainage and no external lesions. No nasal lesions. No dryness. No turbinate hypertrophy. Throat: No oral lesions. No purulence. NECK: Supple, symmetric. No masses. Thyroid is in the midline. No JVD. No carotid bruit.  RESPIRATORY: The patient is not using accessory muscles for respiration. Clear to auscultation. The patient has slight chest wall tenderness  on the right side of the chest.  CARDIOVASCULAR: S1, S2 regular. No murmurs. PMI not displaced. Pulses equal. The patient has femoral and dorsalis pedis pulses. No peripheral edema.  ABDOMEN: No tenderness. Bowel sounds are present. No organomegaly. No CVA  tenderness.  MUSCULOSKELETAL: The patient has a cast on the right leg.  SKIN: Warm and dry.  LYMPH NODES: No cervical lymphadenopathy.  NEUROLOGIC: Cranial nerves II through XII intact. Deep tendon reflexes 2+ bilaterally. Power 5/5 upper and lower extremities. Sensation is intact.  PSYCHIATRIC: The patient's judgment and insight are adequate. Alert and oriented x 3.   LABORATORY, DIAGNOSTIC AND RADIOLOGICAL DATA: WBC 13.9, hemoglobin 15.5, hematocrit 45.3, platelets 142. Electrolytes: Sodium 135, potassium 3.7, chloride 104, bicarbonate 25, BUN 12, creatinine 1, glucose 121. LFTs within normal range. Troponin less than 0.02. CT chest showed pulmonary emboli in the right lower lobe and increased density posteriorly in the right lower lobe, which may be infarct or pneumonia. Large right hilar and subcarinal lymph nodes. Fracture of the 4th through 6th as well as 8th ribs laterally on the right. No pneumothorax. Very small pleural effusion. There are infiltrative changes in the liver. Suspicious for mass in the mid to upper pole of the left kidney. Chest x-ray shows minimally displaced fractures of the anterolateral aspect of the left 8th rib. There is bibasilar atelectasis as well as small pleural effusion on the right. Mild bilateral hypoinflation. No evidence of CHF. EKG: Sinus tachycardia, 102 beats per minute. No ST-T changes.   ASSESSMENT AND PLAN: The patient is a 53 year old male with: 1.  Pleuritic chest pain, hemoptysis, recent rib fractures. Has shortness of breath and CT chest showing pulmonary emboli. I think his hemoptysis is  secondary to pulmonary infarct and pulmonary emboli. We are going to continue him on Lovenox full dose, 1 mg/kg body weight, along with Coumadin and we discussed the risks and benefits of anticoagulation. We will also check a lower leg ultrasound for evaluation of deep vein thrombosis.  2.  Possible pneumonia with leukocytosis, shortness of breath and night sweats. The  patient will be covered empirically with antibiotics. Follow the white count tomorrow.  3.  Recent right ankle fracture. The patient will be seen by physical therapy and continue pain medication with Percocet. We will ask Dr. Mack Guise to see this patient and will  request a physical therapy consult.  4.  Possible depression. The patient is started on Celexa.  5.  Tobacco abuse. Counseled against smoking and the patient will be given nicotine patch. Discussed the plan with the patient.   TIME SPENT: About 55 minutes.   ____________________________ Epifanio Lesches, MD sk:jm D: 02/12/2013 15:45:54 ET T: 02/12/2013 20:29:45 ET JOB#: [id number]  cc: Epifanio Lesches, MD, <Dictator> Guadalupe Maple, MD Epifanio Lesches MD ELECTRONICALLY SIGNED 02/22/2013 22:58

## 2015-01-19 NOTE — Consult Note (Signed)
PATIENT NAME:  [name], [name] [id number] OF BIRTH:  Feb 08, 1961 OF ADMISSION:  11/17/2014OF CONSULTATION: 08/15/2013 PHYSICIAN: Dr. Mariea Clonts PHYSICIAN: Orson Slick, MD FOR CONSULTATION: To evaluate a suicidal patient.  DATA: Randall Harvey is a 54 year old male with a history of alcoholism.  COMPLAINT: "This was my sister."   OF PRESENT ILLNESS: [name] has no psychiatric history except for excessive alcohol use. He was brought to the ER with bleeding cut on the lip from a fall. His alcohol level was elevated and while drunk, he reportedly threatened to kill himself and his dog. The patient adamantly denies. He bitterly complains of his bad luck. He has been working in [employer] all his life. In the spring he broke his ankle, ribs, developed DVT, was diagnosed with kidney cancer and underwent surgery and is not allowed to return to work. He has applied for disability and has a Chief Executive Officer but long ago run out of money and options. He owes his landlord several thousand already. He started drinking more but is not interested in detox or substance abuse treatment. He fell while cooking dinner with his food on the stove. He is upset as he thinks this was his sister who caused IVC. He is no longer drunk, suicidal or dogicidal. He denies any symptoms of depression, anxiety or psychosis. He denies any other than alcohol substance use. He admits to drinking beer when he can afford it. PSYCHIATRIC HISTORY: Alcohol detox 15 or so years ago. No suicide attempts.  PSYCHIATRIC HISTORY: None.  MEDICAL HISTORY: Ankle fracture, history of DVT, Kidney cancer.    ALLERGIES: No known allergies.  ON ADMISSION: None. HISTORY: Lives alone with his dog. Son is supportive. He has been awaiting disability. Smokes 1/2 pack per day.  OF SYSTEMS:No fevers or chills. No weight changes. No double or blurred vision. No hearing loss. No shortness of breath or cough. No chest pain or orthopnea. No abdominal pain, nausea, vomiting, or  diarrhea. No incontinence or frequency. Status post tumor resection.No heat or cold intolerance. No anemia or easy bruising. Sutured cut on the lip.   No muscle or joint pain. No tingling or weakness.   See history of present illness for details EXAMINATION:SIGNS: Temperature 97.2, heart rate 82, blood pressure 134/88, respirations 16.  This is a well developed male in no acute distress. rest of the physical examination is deferred to his primary attending.   DATA: Chemistries are within normal limits. Blood alcohol level 0.132. LFTs within normal limits. TSH 4.55. Urine tox screen is negative for substances. CBC within normal limits. Urinalysis is not suggestive for UTI.  STATUS EXAMINATION: The patient is alert and oriented to person, place, time and situation. He is slightly irritable. He maintains good eye contact. He is unshaven, marginally groomed, wearing hospital scrubs. His mood is "fine" with full affect. Thought process is logical. He denies suicidal or homicidal ideation. There are no delusions, paranoia, or hallucinations. His cognition is grossly intact. His insight and judgment are fair.  I:  Alcohol dependence. II:  Deferred. III:  History of DVT and kidney cancer.IV:  Physical illness, substance abuse, financial, employment, primary support.  AXIS V:  Global assessment of functioning 50.   The patient no longer meets criteria for IVC. I will terminate proceedings. Please discharge as appropriate. No medications recommended. He was given information on substance abuse programs. He was given information on [facility] crisis services.   Electronic Signatures: Orson Slick (MD)  (Signed on 20-Nov-14 22:47)  Authored  Last Updated: 20-Nov-14 22:47  by Orson Slick (MD)
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
## 2015-01-20 NOTE — H&P (Signed)
PATIENT NAME:  Randall Harvey, Randall Harvey MR#:  [id number] DATE OF BIRTH:  08/15/1961  DATE OF ADMISSION:  12/07/2013  REFERRING PHYSICIAN: Dr. Ponciano Ort.   PRIMARY CARE PHYSICIAN: Dr. Golden Pop.   UROLOGY AND ONCOLOGY: Following at [facility].   CHIEF COMPLAINT: Complain chest pain, shortness of breath.   HISTORY OF PRESENT ILLNESS: This is a 54 year old male with known past medical history of right lower extremity DVT and PE. This was status post right leg surgery where he had an ankle fracture in a cast in April of last year. The patient was on anticoagulation then. As well, he was diagnosed with a kidney mass, where he was followed at [facility]. The patient reports in October of last year, he had tumor resection of that mass, where he reports he was diagnosed with stage III kidney cancer. Then, he reports he had a workup which was negative for DVT and PE, where he stopped his anticoagulation. The patient reports he has followup next Monday with [facility] urology/oncology clinic. The patient presents with chest pain of sudden onset, midsternal, nonradiating, as well as accompanied by nausea, sweating, shortness of breath and difficulty breathing. The patient had CT chest angio which was positive for PE. The patient reports chest pain worsened by deep breaths and movement. The patient was given 324 mg of aspirin by EMS. The patient was started on heparin drip in the ED. The patient's first troponin was negative. Hospitalist service was requested to admit the patient.   PAST MEDICAL HISTORY:  1. Kidney mass, status post resection, appears to be stage III kidney cancer as per the patient. He is following with oncology/urology at [facility].  2. History of PE and right lower extremity DVT in the past.  3. History of depression and anxiety.  4. History of  ankle fracture.  5. History of arthritis.   SOCIAL HISTORY: Smokes 10 cigarettes per day. No alcohol. No drugs.   ALLERGIES: No known drug allergies.   PAST SURGICAL  HISTORY: Surgery of right ankle fracture with open reduction and internal fixation on 01/09/2013.   FAMILY HISTORY: Significant for pancreatic cancer in mother and colon cancer in one of his sisters.   HOME MEDICATIONS:  1. Lexapro 10 mg oral daily.  2. Diazepam 10 mg oral at bedtime.   REVIEW OF SYSTEMS:  CONSTITUTIONAL: The patient denies fever, chills, fatigue, weakness, weight gain, weight loss.  EYES: Denies blurry vision, double vision, inflammation, glaucoma.  ENT: Denies tinnitus, ear pain, hearing loss, epistaxis or discharge.  RESPIRATORY: Denies cough, wheezing, hemoptysis, COPD.   CARDIOVASCULAR: Reports chest pain. Denies edema, arrhythmia, palpitations, syncope.  GASTROINTESTINAL: Reports nausea. Denies vomiting, diarrhea, abdominal pain, hematemesis, melena.  GENITOURINARY: Denies dysuria, hematuria or renal colic.  ENDOCRINE: Denies polyuria, polydipsia, heat or cold intolerance.  HEMATOLOGY: Denies anemia, easy bruising, bleeding diathesis.  INTEGUMENT: Denies acne, rash or skin lesions.  MUSCULOSKELETAL: Denies any gout, any swelling, any cramps, any arthritis  NEUROLOGIC: Denies CVA, TIA, headache, ataxia, vertigo, tremor.  PSYCHIATRIC: Reports anxiety and history of depression. Denies any substance or alcohol abuse.   PHYSICAL EXAMINATION:  VITAL SIGNS: Temperature 98.6, pulse 90, respiratory rate 12, blood pressure 116/84, saturating 98% on oxygen.  GENERAL: Well-nourished male, looks comfortable in bed, in no apparent distress.  HEENT: Head atraumatic, normocephalic. Pupils equal and reactive to light. Pink conjunctivae. Anicteric sclerae. Moist oral mucosa.  NECK: Supple. No thyromegaly. No JVD.  CHEST: Good air entry bilaterally. No wheezing, rales, rhonchi.  CARDIOVASCULAR: S1, S2 heard. No  rubs, murmurs, gallops.  ABDOMEN: Obese, soft, nontender, nondistended. Bowel sounds present.  EXTREMITIES: No edema. No clubbing. No cyanosis. Pedal pulses +2 bilaterally.   PSYCHIATRIC: Appropriate affect. Awake, alert x 3. Intact judgment and insight.  NEUROLOGIC: Cranial nerves grossly intact. Motor 5 out of 5. No focal deficits.  SKIN: Normal skin turgor. Warm and dry.  MUSCULOSKELETAL: No joint effusion or erythema could be appreciated.   PERTINENT LABORATORIES: Glucose 122, BUN 8, creatinine 1.08, sodium 137, potassium 3.5, chloride 101, CO2 24. Troponin less than 0.02. White blood cells 7.6, hemoglobin 15.6, hematocrit 46.9, platelets 161. D-dimer is 1.14.   IMAGING: CT chest angiogram showing acute bilateral pulmonary embolism.   ASSESSMENT AND PLAN:  1. Pulmonary embolism: The patient presents with pleuritic chest pain and shortness of breath. His CT chest angiogram is positive for pulmonary embolism. He is on intravenous heparin drip. When he is more stable, will we switched to p.o. anticoagulation, either Xarelto or warfarin. This is the patient's second pulmonary embolism. This episode appears to be unprovoked, so very likely he will need lifelong anticoagulation.  2. Chest pain: Appears to be pleuritic, most likely related to his pulmonary embolism. The patient will be monitored on telemetry. Will cycle his cardiac enzymes. Already given 4 baby aspirin by EMS.  3. Anxiety and depression: Continue the patient on Lexapro and diazepam.  4. Tobacco abuse: The patient was counseled. Will be started on NicoDerm patch.  5. Kidney tumor: Status post resection. The patient is following regularly with [facility]/urology service. Reports that this is stage III kidney cancer, and he will keep his regular appointments with [facility].  6. Deep vein thrombosis prophylaxis: The patient is on full-dose anticoagulation due to pulmonary embolism.   CODE STATUS: The patient reports he is a FULL CODE.   TOTAL TIME SPENT ON ADMISSION AND PATIENT CARE: 55 minutes.   ____________________________ Albertine Patricia,  MD dse:gb D: 12/08/2013 00:10:27 ET T: 12/08/2013 00:45:56 ET JOB#: [id number]  cc: Albertine Patricia, MD, <Dictator>  Graciela Husbands MD ELECTRONICALLY SIGNED 12/10/2013 1:16

## 2015-01-20 NOTE — Consult Note (Signed)
Brief Consult Note: Diagnosis: alcohol abuse.   Patient was seen by consultant.   Consult note dictated.   Discussed with Attending MD.   Comments: Psychiatry: Patient seen and chart reviewed and note dictated. Patient now sober and denies any suicidal ideation and is agreeable to outpt treatment. Medically stable. No longer meets commitment criteria and can be discharged home follow up Dr Kasandra Knudsen.  Electronic Signatures: Weber Cooks, Madie Reno (MD)  (Signed 29-Sep-15 15:43)  Authored: Brief Consult Note   Last Updated: 29-Sep-15 15:43 by Gonzella Lex (MD)

## 2015-01-20 NOTE — Discharge Summary (Signed)
PATIENT NAME:  Randall Harvey, BRANDER MR#:  [id number] DATE OF BIRTH:  [birth year]  DATE OF ADMISSION:  12/07/2013 DATE OF DISCHARGE:  12/09/2013  PRESENTING COMPLAINT: Pleuritic chest pain.   DISCHARGE DIAGNOSES: 1.  Bilateral pulmonary embolism, recurrent.  2.  History of renal cancer, who is being followed at [facility].  3.  Tobacco abuse.  4.  Pleuritic chest pain.   CODE STATUS: FULL CODE.   MEDICATIONS: 1.  Xarelto 20 mg p.o. daily for 3 weeks; thereafter, 15 mg b.i.d.    2.  Lexapro 10 mg daily.  3.  Diazepam 10 mg at bedtime.  4.  Acetaminophen/oxycodone 325/5, 1 tablet every 4 hours as needed.   DIET: Regular.   Follow up with [facility] oncology Monday, March 16, on your scheduled appointment.   Follow up with Dr. Jeananne Rama in 1 to 2 weeks.   CBC within normal limits except platelet count of 117. Basic metabolic panel within normal limits.   CT angiography shows acute bilateral PE. A small left pleural effusion with probable peripheral pulmonary infarct within the left lower lobe. Hepatic steatosis. CBC within normal limits.   BRIEF SUMMARY OF HOSPITAL COURSE: [name] is 54 year old Caucasian gentleman with history of renal cancer who is being followed up at [facility] and ongoing tobacco abuse, comes in with pleuritic chest pain. He was found to have:  1.  Acute bilateral pulmonary embolism, which is recurrent. The patient presents with pleuritic chest pain and shortness of breath. CT chest angiogram is positive for bilateral PE. He was started initially on IV heparin drip but changed to p.o. Xarelto. Discussed with the patient and sister. They were agreeable to take Xarelto for now. The patient is advised to take it indefinitely since it is a recurrent PE. He is getting p.r.n. Percocet for his pleuritic chest pain.  2.  Pleuritic chest pain, likely due to pulmonary embolism. His cardiac enzymes x 3 were negative. He is getting p.r.n. Percocet.  3.  Anxiety, depression. Continue  Lexapro and diazepam.  4.  Kidney tumor status post resection. Follows up in [facility] urology oncology service.  5.  Tobacco abuse. The patient was counseled on smoking cessation.   Hospital stay otherwise was stable. The patient remained a FULL CODE.   TIME SPENT: 40 minutes.   ____________________________ Hart Rochester Posey Pronto, MD sap:dmm D: 12/13/2013 14:48:19 ET T: 12/13/2013 19:38:13 ET JOB#: [id number]  cc:  A. Posey Pronto, MD, <Dictator> Ilda Basset MD ELECTRONICALLY SIGNED 12/16/2013 10:44

## 2015-01-20 NOTE — Consult Note (Signed)
PATIENT NAME:  Randall Harvey, FLYE MR#:  [id number] DATE OF BIRTH:  1960/10/23  DATE OF CONSULTATION:  06/27/2014  CONSULTING PHYSICIAN:  Gonzella Lex, MD  IDENTIFYING INFORMATION AND REASON FOR CONSULTATION: This is a 54 year old man with a history of multiple medical problems who presented to the hospital intoxicated, making suicidal statements. His chief complaint today, "Too much is asked of me and I am overwhelmed."   HISTORY OF PRESENT ILLNESS: Information obtained from the patient and the chart. The patient presented last night to the Emergency Room with a blood alcohol level of 219. At that time he said that he was so upset and overwhelmed, he was thinking of hanging himself. Today, he tells me that he did not actually have any intention of trying to kill himself. He just feels upset because of his current financial status and because he was intoxicated yesterday. He says that his mood stays mildly down and sad much of the time. Sleep is poor. He denies any actual suicidal ideation or wish to die. Denies any hallucinations. Says that he does not drink every day, only about 2-3 days a week. Admits that it has increased a little bit. Yesterday, he drank about a half of a standard size bottle of whiskey by his estimate. He denies that he was abusing other drugs. The patient has a long series of medical problems over the last 18 months which have frustrated him. It started with a broken ankle that put him out of work and then he developed blood clots with pulmonary emboli later. There was a finding of a tumor on his kidney and now he has a hernia in his side where the biopsy incision was made. He feels like it has just been one thing after the other. He has not been able to go back to work. He has been filing for disability and of course has not gotten approved yet. He feels very angry about that. He is not currently living with anyone and feels like he has limited support.  He does have an outpatient  psychiatrist he is seeing as well as a therapist.   PAST PSYCHIATRIC HISTORY: He says that many years ago he was briefly at [facility]. No history of attempts of suicide. Denies any history of violence. He has been treated with antidepressants on 2 occasions in the past, once with Lexapro and once with Paxil. Both made him agitated and he had suicidal thoughts at the time. He is currently seeing Dr. Kasandra Knudsen for outpatient psychiatric care and is taking Valium as prescribed by him. Sees a therapist intermittently as well. The patient says that he has never had DTs or seizures.   SUBSTANCE ABUSE HISTORY: Says he only drinks 2-3 days a week. He admits that it is sometimes a problem, but he tends to minimize it still. No history of seizures or delirium tremens. Denies that he is abusing any other drugs.   FAMILY HISTORY: No known family history of mental illness.   SOCIAL HISTORY: Currently living by himself. Minimal financial support. Family contributes some thing for him that keeps him going.   PAST MEDICAL HISTORY: As noted, he has had a series of medical problems that started with a broken ankle 18 months ago. He then developed pulmonary emboli and has been on anticoagulant since then. Then he was serendipitously found to have a tumor on his kidney and now he has a hernia, which cannot be repaired because of his anticoagulant state and his pulmonary  function apparently.   CURRENT MEDICATIONS:  Xarelto 20 mg a day, Valium 10 mg at night, tramadol 50 mg every 4 hours as needed.   ALLERGIES: No known drug allergies.   REVIEW OF SYSTEMS: Mildly depressed mood. Denies suicidal or homicidal ideation. Denies hallucinations. Some achiness still in his ankle. Otherwise, a 9-point review of systems negative.   MENTAL STATUS EXAMINATION: Alert and oriented, cooperative with the interview. Good eye contact. Normal psychomotor activity. No tremor. Speech normal rate, tone, and volume. Affect reactive, but  appropriate and euthymic. Mood stated as being anxious. Thoughts are lucid. No loosening of associations or delusions. Denies auditory or visual hallucinations. Denies suicidal or homicidal ideation. Judgment and insight adequate; alert and oriented x 4. Short- and long-term memory intact. Normal intelligence.   LABORATORY RESULTS: Glucose elevated at 256, calcium low at 8.0. Alcohol level on admission 219. CBC all normal. The prothrombin time 16.8. Drug screen positive for benzodiazepines.   VITAL SIGNS: Most recent blood pressure 127/87, respirations 18, pulse 90, temperature 98.4.   ASSESSMENT: A 54 year old man with alcohol abuse, who was making a suicidal statement while intoxicated, without actually trying to harm himself. Today, he is sober, totally denies suicidal ideation. Able to articulate positive things about his life and plans for the future. No history of delirium tremens or seizures. Does not require inpatient hospital treatment.   TREATMENT PLAN: Discontinue involuntary commitment. The patient is strongly encouraged to follow up with his outpatient psychiatrist and therapist. Psychoeducation completed. Encouraged to stop drinking, which he agrees to.   DIAGNOSIS, PRINCIPAL AND PRIMARY:  AXIS I: Alcohol abuse.   SECONDARY DIAGNOSES: AXIS I: Depression, not otherwise specified.  AXIS II: Deferred.  AXIS III: History of pulmonary emboli, hernia, ventral, history of a broken ankle, unknown type of cancer.    ____________________________ Gonzella Lex, MD jtc:LT D: 06/27/2014 17:02:53 ET T: 06/27/2014 17:26:38 ET JOB#: [id number]  cc: Gonzella Lex, MD, <Dictator> Gonzella Lex MD ELECTRONICALLY SIGNED 07/06/2014 10:33

## 2015-02-22 DIAGNOSIS — C649 Malignant neoplasm of unspecified kidney, except renal pelvis: Secondary | ICD-10-CM | POA: Insufficient documentation

## 2015-03-08 ENCOUNTER — Telehealth: Payer: Self-pay | Admitting: Family Medicine

## 2015-03-08 DIAGNOSIS — Z111 Encounter for screening for respiratory tuberculosis: Secondary | ICD-10-CM

## 2015-03-08 NOTE — Telephone Encounter (Signed)
That's fine, order entered 

## 2015-03-08 NOTE — Telephone Encounter (Signed)
PT CALLED AND STATED THAT HE FELT THAT HE NEEDED A TB SKIN TEST AND WOULD LIKE A CALL BACK AT THE NUMBER LISTED. HE GOES TO A NURSING HOME AND IN ORDER TO GO SEE HIS GIRLFRIEND  AND HELP OUT AT [facility] [employer] SAYS THE STATE REQUIRES HE HAVE THE SKIN TEST. HE IS UNSURE ON WHAT HE SHOULD DO AND WOULD LIKE GUIDANCE OF ANY SORT.

## 2015-03-08 NOTE — Telephone Encounter (Signed)
Pt. Notified, he has scheduled an appt and will get it drawn when he comes in.

## 2015-03-08 NOTE — Telephone Encounter (Signed)
Dr. Sanda Klein, can he come in for labs for a quantum feron gold test?

## 2015-03-14 ENCOUNTER — Ambulatory Visit: Payer: Self-pay | Admitting: Family Medicine

## 2015-03-16 ENCOUNTER — Ambulatory Visit (INDEPENDENT_AMBULATORY_CARE_PROVIDER_SITE_OTHER): Payer: Medicaid Other | Admitting: Family Medicine

## 2015-03-16 ENCOUNTER — Encounter: Payer: Self-pay | Admitting: Family Medicine

## 2015-03-16 VITALS — BP 138/90 | HR 77 | Temp 99.0°F | Wt 186.0 lb

## 2015-03-16 DIAGNOSIS — G5791 Unspecified mononeuropathy of right lower limb: Secondary | ICD-10-CM | POA: Diagnosis not present

## 2015-03-16 DIAGNOSIS — Z111 Encounter for screening for respiratory tuberculosis: Secondary | ICD-10-CM | POA: Diagnosis not present

## 2015-03-16 NOTE — Patient Instructions (Signed)
We'll let you know about the quantiferon gold test results Those should be back Monday or Tuesday

## 2015-03-16 NOTE — Progress Notes (Signed)
   BP 138/90 mmHg  Pulse 77  Temp(Src) 99 F (37.2 C)  Wt 186 lb (84.369 kg)  SpO2 98%   Subjective:    Patient ID: Randall Harvey., male    DOB: 1961-07-10, 54 y.o.   MRN: [id number]  HPI: Randall Harvey. is a 54 y.o. male  Chief Complaint  Patient presents with  . Forms  . Needs Labs    needs lab for TB screen   He has a form to fill out for disability parking He says he cannot walk 200 feet without stopping to rest; because the nerve damage in the right foot from the ankle break He needs test for TB  Relevant past medical, surgical, family and social history reviewed and updated as indicated. Interim medical history since our last visit reviewed. Allergies and medications reviewed and updated.  Review of Systems Per HPI unless specifically indicated above     Objective:    BP 138/90 mmHg  Pulse 77  Temp(Src) 99 F (37.2 C)  Wt 186 lb (84.369 kg)  SpO2 98%  Wt Readings from Last 3 Encounters:  03/16/15 186 lb (84.369 kg)  12/15/14 195 lb (88.451 kg)    Physical Exam  Constitutional: He appears well-developed and well-nourished. No distress.  Cardiovascular: Normal rate.   Pulmonary/Chest: Effort normal.  Skin: Skin is dry. No rash noted.  Psychiatric: He has a normal mood and affect. His behavior is normal. Judgment and thought content normal.      Assessment & Plan:   Problem List Items Addressed This Visit    None    Visit Diagnoses    Screening examination for pulmonary tuberculosis    -  Primary    Relevant Orders    Quantiferon tb gold assay (blood)    Screening for tuberculosis        quantiferon gold test ordered; results should be back Monday or Tuesday    Neuropathy of right foot        disability parking form completed for patient for six month duration; he asked why it wasn't permanent, and I explained I am always hopeful for medical science    Relevant Medications    gabapentin (NEURONTIN) 300 MG capsule    ALPRAZolam (XANAX) 1 MG tablet       (Note:  I do NOT prescribe the Xanax)  Follow up plan: No Follow-up on file.

## 2015-03-21 ENCOUNTER — Telehealth: Payer: Self-pay | Admitting: Family Medicine

## 2015-03-21 DIAGNOSIS — R7612 Nonspecific reaction to cell mediated immunity measurement of gamma interferon antigen response without active tuberculosis: Secondary | ICD-10-CM | POA: Insufficient documentation

## 2015-03-21 LAB — QUANTIFERON TB GOLD ASSAY (BLOOD)

## 2015-03-21 LAB — QUANTIFERON IN TUBE
QFT TB AG MINUS NIL VALUE: 0.69 IU/mL
QUANTIFERON MITOGEN VALUE: >10 IU/mL
QUANTIFERON TB AG VALUE: 0.76 IU/mL
QUANTIFERON TB GOLD: POSITIVE — AB
Quantiferon Nil Value: 0.07 IU/mL

## 2015-03-21 NOTE — Telephone Encounter (Signed)
I tried to reach patient about lab result; I did not recognize name "Randall Harvey" or voice on the recording for home number given, so I left vague msg asking if this person was a patient of mine to please call back Dr. Sanda Klein 03/21/15 at 0915 hours  AMY--> Please contact patient and let him know that the screening test he asked [place] to do came up positive so we need to do more testing Sometimes people have a false positive test, but this might mean he has been exposed to TB in the past; we will have the health department get in touch with him He absolutely cannot be at his girlfriend's place of work until the health dept works with him and clears him  Please report this to [facility] Dept and have them get in touch with him for next evaluation and testing

## 2015-03-21 NOTE — Telephone Encounter (Signed)
I left a very detailed message for the TB clinic nurse at [facility]. Notified them that he had a positive quantum feron gold test. I left on the message patient's correct contact numbers to call him to schedule follow up. I also left our contact information if they needed anything else from [place]. I also asked that she would call us back to let us know she got my message.

## 2015-03-21 NOTE — Telephone Encounter (Signed)
I notified patient of the results and that he must NOT go to any group/nursing home until this is cleared. I advised him that the health department will be calling him to schedule follow up care. I asked that if he doesn't hear from them tomorrow, Thursday, to call us and let us know.

## 2015-03-22 ENCOUNTER — Telehealth: Payer: Self-pay | Admitting: Family Medicine

## 2015-03-22 NOTE — Telephone Encounter (Signed)
Sharyn Lull from the [facility] Dept is calling in regards to the pt's TB diagnosis. Please call Sharyn Lull at your earliest convenience @ [phone]. Thanks.

## 2015-03-23 NOTE — Telephone Encounter (Signed)
I spoke back with them this morning, they asked that I fax his test results and med list. I faxed them.

## 2015-04-12 ENCOUNTER — Ambulatory Visit
Admission: RE | Admit: 2015-04-12 | Discharge: 2015-04-12 | Disposition: A | Discharge status: Home / Self Care | Payer: PRIVATE HEALTH INSURANCE | Source: Ambulatory Visit | Attending: Infectious Diseases | Admitting: Infectious Diseases

## 2015-04-12 ENCOUNTER — Other Ambulatory Visit: Payer: Self-pay | Admitting: Infectious Diseases

## 2015-04-12 DIAGNOSIS — R7612 Nonspecific reaction to cell mediated immunity measurement of gamma interferon antigen response without active tuberculosis: Secondary | ICD-10-CM | POA: Insufficient documentation

## 2015-04-12 DIAGNOSIS — Z87891 Personal history of nicotine dependence: Secondary | ICD-10-CM | POA: Insufficient documentation

## 2015-04-15 ENCOUNTER — Other Ambulatory Visit: Payer: Self-pay | Admitting: Family Medicine

## 2015-05-09 ENCOUNTER — Encounter: Payer: Self-pay | Admitting: Family Medicine

## 2015-05-09 ENCOUNTER — Telehealth: Payer: Self-pay | Admitting: Family Medicine

## 2015-05-09 NOTE — Telephone Encounter (Signed)
He his scheduled to have a tooth pulled on Aug. 17th. His dentist is saying he needs to stop his Xarelto 5 days prior. Patient states you had mentioned to him that you had researched it and that he could have it pulled without stopping the Xarelto. Patient states his dentist office is needing some type of documentation stating you had researched it and that he can't stop taking it.  He goes to Dr. Nicola Girt, their phone number is [phone].

## 2015-05-09 NOTE — Telephone Encounter (Signed)
Please call pt ASAP! Thanks. °

## 2015-05-09 NOTE — Telephone Encounter (Signed)
Letter typed; ready to go

## 2015-07-30 ENCOUNTER — Telehealth: Payer: Self-pay | Admitting: Family Medicine

## 2015-07-30 NOTE — Telephone Encounter (Signed)
Patient schedule f/u appt on 08/16/15.

## 2015-08-16 ENCOUNTER — Ambulatory Visit: Payer: Medicaid Other | Admitting: Family Medicine

## 2015-08-28 ENCOUNTER — Ambulatory Visit (INDEPENDENT_AMBULATORY_CARE_PROVIDER_SITE_OTHER): Payer: Medicare Other | Admitting: Family Medicine

## 2015-08-28 ENCOUNTER — Encounter: Payer: Self-pay | Admitting: Family Medicine

## 2015-08-28 VITALS — BP 133/85 | HR 89 | Temp 98.4°F | Ht 65.0 in | Wt 193.0 lb

## 2015-08-28 DIAGNOSIS — R7612 Nonspecific reaction to cell mediated immunity measurement of gamma interferon antigen response without active tuberculosis: Secondary | ICD-10-CM

## 2015-08-28 DIAGNOSIS — I2699 Other pulmonary embolism without acute cor pulmonale: Secondary | ICD-10-CM | POA: Insufficient documentation

## 2015-08-28 DIAGNOSIS — M67441 Ganglion, right hand: Secondary | ICD-10-CM | POA: Diagnosis not present

## 2015-08-28 DIAGNOSIS — Z720 Tobacco use: Secondary | ICD-10-CM | POA: Diagnosis not present

## 2015-08-28 DIAGNOSIS — I2782 Chronic pulmonary embolism: Secondary | ICD-10-CM

## 2015-08-28 DIAGNOSIS — M67449 Ganglion, unspecified hand: Secondary | ICD-10-CM | POA: Insufficient documentation

## 2015-08-28 DIAGNOSIS — M25561 Pain in right knee: Secondary | ICD-10-CM

## 2015-08-28 DIAGNOSIS — M25562 Pain in left knee: Secondary | ICD-10-CM

## 2015-08-28 DIAGNOSIS — K760 Fatty (change of) liver, not elsewhere classified: Secondary | ICD-10-CM

## 2015-08-28 DIAGNOSIS — F411 Generalized anxiety disorder: Secondary | ICD-10-CM

## 2015-08-28 DIAGNOSIS — Z23 Encounter for immunization: Secondary | ICD-10-CM | POA: Diagnosis not present

## 2015-08-28 DIAGNOSIS — N5201 Erectile dysfunction due to arterial insufficiency: Secondary | ICD-10-CM

## 2015-08-28 DIAGNOSIS — N529 Male erectile dysfunction, unspecified: Secondary | ICD-10-CM | POA: Insufficient documentation

## 2015-08-28 DIAGNOSIS — E669 Obesity, unspecified: Secondary | ICD-10-CM

## 2015-08-28 MED ORDER — SILDENAFIL CITRATE 100 MG PO TABS: 50.0000 mg | ORAL_TABLET | Freq: Every day | ORAL | Status: DC | PRN

## 2015-08-28 NOTE — Patient Instructions (Addendum)
AVOID flavored e-cigarettes Good luck quitting smoking Request labs from health department We'll bring you back in for additional labs if needed; if you have not heard back from [place] in TWO weeks about your labs, please call us to see if labs are needed Bring the forms in for handicapped parking stickers Return in 6 months, sooner if needed; 30 minute Gina--> see if [facility] [place] visit applies to him and schedule if needed You received the flu shot today; it should protect you against the flu virus over the coming months; it will take about two weeks for antibodies to develop; do try to stay away from hospitals, nursing homes, and daycares during peak flu season; taking 1000 mg of vitamin C daily during flu season may help you avoid getting sick Work on modest weight loss, 5-10 pounds before next visit

## 2015-08-28 NOTE — Assessment & Plan Note (Signed)
Patient to see orthopaedist; he believes he is due for another injection

## 2015-08-28 NOTE — Assessment & Plan Note (Signed)
Continue treatment per health department

## 2015-08-28 NOTE — Progress Notes (Signed)
BP 133/85 mmHg  Pulse 89  Temp(Src) 98.4 F (36.9 C)  Ht 5\' 5"  (1.651 m)  Wt 193 lb (87.544 kg)  BMI 32.12 kg/m2  SpO2 97%   Subjective:    Patient ID: Randall Girt., male    DOB: 1961-02-07, 54 y.o.   MRN: [id number]  HPI: Itay Avellino. is a 54 y.o. male  Chief Complaint  Patient presents with  . Knot on Finger    knot on pinky finger, right hard. Has been there for a while.  . Samples    he'd like samples of Cialis  . TB Med    He's on a med from the health dept but forgot to bring it and doesn't know the name of it. He has 1 month left on it.   He has a knot on the pinky finger of the right hand; been there for months; he had one behind his ear once; the one on the finger does not get red or hot; able to bend the finger okay; has not tried to pop it; he is right-handed  Currently being treated for TB by the health dept, has one more month of treatments; they are giving him the medicines; rifampin he thinks when asked; makes urine orange; had hives for a while, med stopped, hives resolved, then started back and no more hives; no night sweats, no cough, no weight loss; he does not know where he   He has ED; asked for samples of cialis and it works well  Health dept has done labs several times, even in the last three weeks, so he'd like [place] to get those instead of drawing more blood today  He says he did a form for handicapped form; he needs a five year form; unable to walk more than 100 feet without having to stop to rest, that is true he says  He says his BP is pretty good  Hx of pulmonary embolism, DVT; on lifelong anticoagulation  Getting flu shot today  He has put on a few pounds, eating better  Relevant past medical, surgical, family and social history reviewed and updated as indicated Past Medical History  Diagnosis Date  . Hypertension   . Obesity   . Tobacco abuse   . Elevated liver enzymes March 2015  . Carotid atherosclerosis     <50% left and right  .  Renal mass, left Oct 2014    with ureteral obstruction  . Personal history of DVT (deep vein thrombosis) Oct 2014  . Pulmonary embolism and infarction [facility]) Oct 2014  . Pulmonary embolism and infarction [facility]) March 2015  . Anticoagulant long-term use     lifetime use, coumadin therapy, then Xarelto  . Fatty liver   . Thrombocytopenia [place])    Past Surgical History  Procedure Laterality Date  . Hernia repair    . Ankle fracture surgery  April 2014  . Robotic assited partial nephrectomy Left Oct 2014   Family History  Problem Relation Age of Onset  . Cancer Mother     pancreatic  . Cancer Father     colon  . Diabetes Father   . Cancer Sister     colon  . COPD Sister   . Seizures Sister   . Cancer Paternal Grandmother     bone  . Stroke Paternal Grandfather   . Heart disease Paternal Grandfather   . Hypertension Neg Hx   . Cancer Sister     ovarian  Social History   Social History  . Marital Status: Divorced    Spouse Name: N/A  . Number of Children: N/A  . Years of Education: N/A   Social History Main Topics  . Smoking status: Current Every Day Smoker -- 0.50 packs/day    Types: Cigarettes  . Smokeless tobacco: Never Used  . Alcohol Use: Yes     Comment: occasionally  . Drug Use: No  . Sexual Activity: Not Asked   Other Topics Concern  . None   Social History Narrative   Interim medical history since our last visit reviewed. -- treated for TB from health dept; got his disability  Allergies and medications reviewed and updated.  Review of Systems  HENT: Negative for mouth sores.   Gastrointestinal: Negative for blood in stool.  Genitourinary: Negative for hematuria.  Hematological: Bruises/bleeds easily (just with mild trauma).  Per HPI unless specifically indicated above     Objective:    BP 133/85 mmHg  Pulse 89  Temp(Src) 98.4 F (36.9 C)  Ht 5\' 5"  (1.651 m)  Wt 193 lb (87.544 kg)  BMI 32.12 kg/m2  SpO2 97%  Wt Readings from Last 3  Encounters:  08/28/15 193 lb (87.544 kg)  03/16/15 186 lb (84.369 kg)  12/15/14 195 lb (88.451 kg)    Physical Exam  Constitutional: He appears well-developed and well-nourished.  obese  Eyes: No scleral icterus.  Neck: No JVD present.  Cardiovascular: Normal rate and regular rhythm.   Pulmonary/Chest: Effort normal and breath sounds normal.  Abdominal: Soft. Bowel sounds are normal. There is no tenderness.  Musculoskeletal: He exhibits no edema.       Right hand: He exhibits normal range of motion.  Nodule just proximal to the DIP of the fifth finger right hand; no overlying erythema; crepitus in both knees, left worse than right  Neurological: He is alert.  Skin: No erythema. No pallor.  Psychiatric: He has a normal mood and affect.   Results for orders placed or performed in visit on 03/16/15  Quantiferon tb gold assay (blood)  Result Value Ref Range   QUANTIFERON INCUBATION Comment   QuantiFERON In Tube  Result Value Ref Range   QUANTIFERON TB GOLD Positive (A) Negative   QUANTIFERON CRITERIA Comment    QUANTIFERON TB AG VALUE 0.76 IU/mL   Quantiferon Nil Value 0.07 IU/mL   QUANTIFERON MITOGEN VALUE >10.00 IU/mL   QFT TB AG MINUS NIL VALUE 0.69 IU/mL   Interpretation: Comment       Assessment & Plan:   Problem List Items Addressed This Visit      Cardiovascular and Mediastinum   Pulmonary embolus (HCC)    Continue anticoagulation for life; no evidence of bleeding on medication      Relevant Medications   sildenafil (VIAGRA) 100 MG tablet     Digestive   Fatty liver    With hx of elevated liver enzymes; patient does not wish to get labs today since he just had labs done in the last few weeks through the health dept; we'll request those results; patient was asked to please call me if he hasn't heard back as to whether or not additional labs will be needed above and beyond what were drawn at the health dept        Musculoskeletal and Integument   Ganglion cyst  of flexor tendon sheath of finger - Primary    Refer to hand specialist; explained diagnosis; do NOT try to "pop" by himself, joint can  get infected      Relevant Orders   Ambulatory referral to Orthopedic Surgery     Genitourinary   Erectile dysfunction    Samples of PDE-5 inhibitor given, Viagra (no cialis available here today)        Other   Positive 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    Continue treatment per health department      Knee pain, bilateral    Patient to see orthopaedist; he believes he is due for another injection      Anxiety disorder    Sees psychiatrist; I do NOT prescribe his benzo      Obesity    Encouraged modest weight loss      Tobacco abuse    Encouraged cessation; warned about dangers of flavored e-cigs / vapor products (bronchiolitis obliterans)       Other Visit Diagnoses    Needs flu shot        flu vaccine given today    Encounter for immunization           Follow up plan: No Follow-up on file.

## 2015-09-01 DIAGNOSIS — K760 Fatty (change of) liver, not elsewhere classified: Secondary | ICD-10-CM | POA: Insufficient documentation

## 2015-09-01 DIAGNOSIS — F419 Anxiety disorder, unspecified: Secondary | ICD-10-CM | POA: Insufficient documentation

## 2015-09-01 DIAGNOSIS — Z720 Tobacco use: Secondary | ICD-10-CM | POA: Insufficient documentation

## 2015-09-01 DIAGNOSIS — E669 Obesity, unspecified: Secondary | ICD-10-CM | POA: Insufficient documentation

## 2015-09-01 NOTE — Assessment & Plan Note (Addendum)
Sees psychiatrist; I do NOT prescribe his benzo

## 2015-09-01 NOTE — Assessment & Plan Note (Signed)
Refer to hand specialist; explained diagnosis; do NOT try to "pop" by himself, joint can get infected

## 2015-09-01 NOTE — Assessment & Plan Note (Signed)
With hx of elevated liver enzymes; patient does not wish to get labs today since he just had labs done in the last few weeks through the health dept; we'll request those results; patient was asked to please call me if he hasn't heard back as to whether or not additional labs will be needed above and beyond what were drawn at the health dept

## 2015-09-01 NOTE — Assessment & Plan Note (Signed)
Encouraged cessation; warned about dangers of flavored e-cigs / vapor products (bronchiolitis obliterans)

## 2015-09-01 NOTE — Assessment & Plan Note (Signed)
Continue anticoagulation for life; no evidence of bleeding on medication

## 2015-09-01 NOTE — Assessment & Plan Note (Signed)
Samples of PDE-5 inhibitor given, Viagra (no cialis available here today)

## 2015-09-01 NOTE — Assessment & Plan Note (Signed)
Encouraged modest weight loss 

## 2015-09-03 DIAGNOSIS — R7612 Nonspecific reaction to cell mediated immunity measurement of gamma interferon antigen response without active tuberculosis: Secondary | ICD-10-CM | POA: Diagnosis not present

## 2015-12-22 ENCOUNTER — Other Ambulatory Visit: Payer: Self-pay | Admitting: Family Medicine

## 2016-02-27 ENCOUNTER — Ambulatory Visit: Payer: Medicare Other | Admitting: Family Medicine

## 2016-03-19 ENCOUNTER — Ambulatory Visit: Payer: Medicare Other | Admitting: Family Medicine

## 2016-06-30 DIAGNOSIS — C642 Malignant neoplasm of left kidney, except renal pelvis: Secondary | ICD-10-CM | POA: Diagnosis not present

## 2016-06-30 DIAGNOSIS — Z720 Tobacco use: Secondary | ICD-10-CM | POA: Diagnosis not present

## 2016-06-30 DIAGNOSIS — M67441 Ganglion, right hand: Secondary | ICD-10-CM | POA: Diagnosis not present

## 2016-06-30 DIAGNOSIS — K8020 Calculus of gallbladder without cholecystitis without obstruction: Secondary | ICD-10-CM | POA: Diagnosis not present

## 2016-07-26 ENCOUNTER — Telehealth: Payer: Self-pay | Admitting: Family Medicine

## 2016-07-27 NOTE — Telephone Encounter (Signed)
Patient has not shown for his last two appointments; he has not been seen at [facility]; please see if he is going to stay at [facility]; if so, forward request to that office; if he is staying with me, he needs to schedule an appt please; I'll allow 7 days of medicine for him to reached and appt scheduled to decide about further refills

## 2016-07-28 NOTE — Telephone Encounter (Signed)
Tried contacting patient, wrong number in the system.

## 2016-07-31 ENCOUNTER — Ambulatory Visit: Payer: Medicare Other | Admitting: Family Medicine

## 2016-08-01 DIAGNOSIS — M67441 Ganglion, right hand: Secondary | ICD-10-CM | POA: Diagnosis not present

## 2016-08-07 ENCOUNTER — Ambulatory Visit: Payer: Medicare Other | Admitting: Family Medicine

## 2016-08-07 ENCOUNTER — Ambulatory Visit (INDEPENDENT_AMBULATORY_CARE_PROVIDER_SITE_OTHER): Payer: Medicare Other | Admitting: Family Medicine

## 2016-08-07 VITALS — BP 140/84 | HR 94 | Temp 98.3°F | Wt 190.0 lb

## 2016-08-07 DIAGNOSIS — F1290 Cannabis use, unspecified, uncomplicated: Secondary | ICD-10-CM

## 2016-08-07 DIAGNOSIS — K760 Fatty (change of) liver, not elsewhere classified: Secondary | ICD-10-CM | POA: Diagnosis not present

## 2016-08-07 DIAGNOSIS — I2782 Chronic pulmonary embolism: Secondary | ICD-10-CM | POA: Diagnosis not present

## 2016-08-07 DIAGNOSIS — M67441 Ganglion, right hand: Secondary | ICD-10-CM

## 2016-08-07 DIAGNOSIS — W548XXA Other contact with dog, initial encounter: Secondary | ICD-10-CM

## 2016-08-07 DIAGNOSIS — F411 Generalized anxiety disorder: Secondary | ICD-10-CM

## 2016-08-07 DIAGNOSIS — Z23 Encounter for immunization: Secondary | ICD-10-CM | POA: Insufficient documentation

## 2016-08-07 LAB — CBC WITH DIFFERENTIAL/PLATELET
Basophils Absolute: 0 cells/uL (ref 0–200)
Basophils Relative: 0 %
Eosinophils Absolute: 94 cells/uL (ref 15–500)
Eosinophils Relative: 1 %
HCT: 47.4 % (ref 38.5–50.0)
Hemoglobin: 16.1 g/dL (ref 13.2–17.1)
Lymphocytes Relative: 26 %
Lymphs Abs: 2444 cells/uL (ref 850–3900)
MCH: 32.1 pg (ref 27.0–33.0)
MCHC: 34 g/dL (ref 32.0–36.0)
MCV: 94.6 fL (ref 80.0–100.0)
MPV: 9.9 fL (ref 7.5–12.5)
Monocytes Absolute: 658 cells/uL (ref 200–950)
Monocytes Relative: 7 %
Neutro Abs: 6204 cells/uL (ref 1500–7800)
Neutrophils Relative %: 66 %
Platelets: 206 10*3/uL (ref 140–400)
RBC: 5.01 MIL/uL (ref 4.20–5.80)
RDW: 12.4 % (ref 11.0–15.0)
WBC: 9.4 10*3/uL (ref 3.8–10.8)

## 2016-08-07 LAB — COMPLETE METABOLIC PANEL WITH GFR
ALT: 19 U/L (ref 9–46)
AST: 17 U/L (ref 10–35)
Albumin: 4.2 g/dL (ref 3.6–5.1)
Alkaline Phosphatase: 75 U/L (ref 40–115)
BUN: 14 mg/dL (ref 7–25)
CO2: 22 mmol/L (ref 20–31)
Calcium: 8.9 mg/dL (ref 8.6–10.3)
Chloride: 105 mmol/L (ref 98–110)
Creat: 1.04 mg/dL (ref 0.70–1.33)
GFR, Est African American: >89 mL/min (ref >=60)
GFR, Est Non African American: 79 mL/min (ref >=60)
Glucose, Bld: 118 mg/dL — ABNORMAL HIGH (ref 65–99)
Potassium: 4.2 mmol/L (ref 3.5–5.3)
Sodium: 140 mmol/L (ref 135–146)
Total Bilirubin: 0.9 mg/dL (ref 0.2–1.2)
Total Protein: 6.9 g/dL (ref 6.1–8.1)

## 2016-08-07 LAB — LIPID PANEL
Cholesterol: 144 mg/dL (ref <200)
HDL: 26 mg/dL — ABNORMAL LOW (ref >40)
Total CHOL/HDL Ratio: 5.5 Ratio — ABNORMAL HIGH (ref <5.0)
Triglycerides: 403 mg/dL — ABNORMAL HIGH (ref <150)

## 2016-08-07 MED ORDER — RIVAROXABAN 20 MG PO TABS: 20.0000 mg | ORAL_TABLET | Freq: Every day | ORAL | 0 refills | Status: DC

## 2016-08-07 NOTE — Assessment & Plan Note (Addendum)
With hx of infarction; continue lifelong anticoagulation with agent like Xarelto, check CBC

## 2016-08-07 NOTE — Progress Notes (Signed)
BP 140/84   Pulse 94   Temp 98.3 F (36.8 C)   Wt 190 lb (86.2 kg)   SpO2 95%   BMI 31.62 kg/m    Subjective:    Patient ID: Randall Girt., male    DOB: Oct 23, 1960, 55 y.o.   MRN: [id number]  HPI: Randall Merritt. is a 55 y.o. male  Chief Complaint  Patient presents with  . Follow-up   He needs refills of Xarelto he says; hx of DVT and PE; lifelong anticoagulation  The main reason for his visit is that he'd like me to prescribe him some Xanax; he has an empty bottle of Xanax 1 mg TID from his psychiatrist, has run out and wants me to write it He still gets anxiety attacks; still gets some depression from the whole process of applying for disability He was seeing Dr. Kasandra Knudsen, the psychiatrist, in [place] He has been going to [facility] on [address] for counseling, peer support person; he would come to the house; they are not helping him anymore He transferred to a psychiatrist who comes from [place] He has been out of alprazolam 1 mg TID for a month; filled 06/06/16; he used to be on valium but the xanax worked better, "because they're quicker" Dr. Rosine Door refilled the xanax 06/06/16 and he has been out now for a month; I asked again, and clarified that he has been out of his medicine for an entire month  Was scratched with shih-tzu on the back of the middle finger this morning, the dog's shots are UTD; dog not acting unusual, was just playing he says  Dr. Rudene Christians is going to remove the ganglion cyst on the 30th, pinky finger, dominant right hand  Quantiferon gold test positive, treated for four months with medication by the health department; patient says he has a card proving it  Depression screen [facility] 2/9 08/07/2016 08/28/2015  Decreased Interest 2 2  Down, Depressed, Hopeless 1 1  PHQ - 2 Score 3 3  Altered sleeping 1 -  Tired, decreased energy 0 -  Change in appetite 0 -  Feeling bad or failure about yourself  1 -  Trouble concentrating 0 -  Moving slowly or  fidgety/restless 0 -  Suicidal thoughts 0 -  PHQ-9 Score 5 -  Difficult doing work/chores Very difficult -   Relevant past medical, surgical, family and social history reviewed Past Medical History:  Diagnosis Date  . Anticoagulant long-term use    lifetime use, coumadin therapy, then Xarelto  . Carotid atherosclerosis    <50% left and right  . Elevated liver enzymes March 2015  . Fatty liver   . Hypertension   . Obesity   . Personal history of DVT (deep vein thrombosis) Oct 2014  . Pulmonary embolism and infarction [facility]) Oct 2014  . Pulmonary embolism and infarction [facility]) March 2015  . Renal mass, left Oct 2014   with ureteral obstruction  . Thrombocytopenia ([place])   . Tobacco abuse    Past Surgical History:  Procedure Laterality Date  . ANKLE FRACTURE SURGERY  April 2014  . HERNIA REPAIR    . ROBOTIC ASSITED PARTIAL NEPHRECTOMY Left Oct 2014   Family History  Problem Relation Age of Onset  . Cancer Mother     pancreatic  . Cancer Father     colon  . Diabetes Father   . Cancer Sister     colon  . COPD Sister   . Seizures Sister   .  Cancer Paternal Grandmother     bone  . Stroke Paternal Grandfather   . Heart disease Paternal Grandfather   . Hypertension Neg Hx   . Cancer Sister     ovarian   Social History  Substance Use Topics  . Smoking status: Current Every Day Smoker    Packs/day: 0.50    Types: Cigarettes  . Smokeless tobacco: Never Used  . Alcohol use Yes     Comment: occasionally  MD note: patient admits to marijuana use when I asked if there might be anything if I checked a urine drug screen  Interim medical history since last visit reviewed. Allergies and medications reviewed  Review of Systems Per HPI unless specifically indicated above     Objective:    BP 140/84   Pulse 94   Temp 98.3 F (36.8 C)   Wt 190 lb (86.2 kg)   SpO2 95%   BMI 31.62 kg/m   Wt Readings from Last 3 Encounters:  08/07/16 190 lb (86.2 kg)  08/28/15 193 lb  (87.5 kg)  03/16/15 186 lb (84.4 kg)    Physical Exam  Constitutional: He appears well-developed and well-nourished. No distress.  obese  HENT:  Head: Normocephalic and atraumatic.  Eyes: EOM are normal. No scleral icterus.  Neck: No thyromegaly present.  Cardiovascular: Normal rate and regular rhythm.   Pulmonary/Chest: Effort normal and breath sounds normal.  Abdominal: Soft. He exhibits no distension. There is no tenderness.  Musculoskeletal: He exhibits no edema.       Right hand: He exhibits deformity and swelling (cystic lesion flexor aspect of prox RIGHT pinky finger; no erythema).  Neurological: He is alert.  Skin: Skin is warm and dry. No pallor.  Ruddy complexion with dilated spider capillaries on face; superficial scratch on the extensor surface of the right middle finger; single flap lesion; no puncture seen; no proximal erythema; no fluctuance; some blood on the bandage with easy bleeding after wound cleaned with alcohol  Psychiatric: He has a normal mood and affect. His mood appears not anxious. His speech is not rapid and/or pressured, not tangential and not slurred. He is agitated and hyperactive. He is not aggressive, not actively hallucinating and not combative. Thought content is not paranoid and not delusional. Cognition and memory are not impaired. He does not exhibit a depressed mood. He expresses no homicidal and no suicidal ideation.  Did not appear anxious, but rather fidgety, stood for most of the visit; complained that it was "hot in here", appeared restless      Assessment & Plan:   Problem List Items Addressed This Visit      Cardiovascular and Mediastinum   Pulmonary embolus ([place])    With hx of infarction; continue lifelong anticoagulation with agent like Xarelto, check CBC      Relevant Medications   rivaroxaban (XARELTO) 20 MG TABS tablet   Other Relevant Orders   CBC with Differential/Platelet (Completed)     Digestive   Fatty liver    Check  liver function tests      Relevant Orders   COMPLETE METABOLIC PANEL WITH GFR (Completed)   Lipid panel (Completed)     Musculoskeletal and Integument   Ganglion cyst of flexor tendon sheath of finger    Patient has plans to have this repaired soon; he should NOT stop his Factor Xa inhibitor abruptly for several days prior to surgery, as I think a bridge with Lovenox right up until the operation would be safer for him,  given the extent of his prior PE with infarction; he should start back on his Xarelto immediately after surgery if surgeon agrees        Other   Need for diphtheria-tetanus-pertussis (Tdap) vaccine, adult/adolescent    Given today      Relevant Orders   Tdap vaccine greater than or equal to 7yo IM (Completed)   Marijuana use    I explained to patient that this prevents me from being able to prescribe him any benzo, so he will need to obtain prescriptions to treat his anxiety from his psychiatrist; if not able to return to Dr. Rosine Door, then he can return to Dr. Kasandra Knudsen, or provider of his choice      Dog scratch    Advised patient to keep a very close eye on this finger/wound; cleaned and covered with antibiotic ointment; tetanus booster given today; discussed P. multocida, risk of serious complications if infection and then gets into the extensor tendon; patient agrees to keep clean and watch closely      Anxiety disorder - Primary    Patient has been seeing psychiatrist; was getting Xanax 1 mg TID but apparently no SSRI or SNRI; I am not going to prescribe his Xanax and he will need to continue under the care of a psychiatrist for this type of medicine; he states he has been completely out of his medicine for a month, so withdrawal should not be an issue unless he has been obtaining it illicitly; he seemed restless today, so I don't know if there is other drug use that might be contributing to his anxiety       Other Visit Diagnoses    Needs flu shot       Relevant Orders    Flu Vaccine QUAD 36+ mos PF IM (Fluarix & Fluzone Quad PF) (Completed)      Follow up plan: No Follow-up on file.  An after-visit summary was printed and given to the patient at [place].  Please see the patient instructions which may contain other information and recommendations beyond what is mentioned above in the assessment and plan.  Meds ordered this encounter  Medications  . rivaroxaban (XARELTO) 20 MG TABS tablet    Sig: Take 1 tablet (20 mg total) by mouth daily with supper.    Dispense:  30 tablet    Refill:  0    Please consider 90 day supplies to promote better adherence    Orders Placed This Encounter  Procedures  . Flu Vaccine QUAD 36+ mos PF IM (Fluarix & Fluzone Quad PF)  . Tdap vaccine greater than or equal to 7yo IM  . COMPLETE METABOLIC PANEL WITH GFR  . CBC with Differential/Platelet  . Lipid panel

## 2016-08-07 NOTE — Assessment & Plan Note (Addendum)
Check liver function tests

## 2016-08-07 NOTE — Assessment & Plan Note (Signed)
Given today.

## 2016-08-07 NOTE — Patient Instructions (Addendum)
You received the vaccine to protect against tetanus and diphtheria and pertussis today; the tetanus and diphtheria portions will provide protection up to ten years, and the pertussis component will give you protection against whooping cough for life Keep the wound on the back of the finger clean and covered and seek medical attention right away if any redness tracking up the tendon, or any fever or pus Have your orthopaedist contact me if they need instructions on what to do with your Xarelto It should not be stopped abruptly

## 2016-08-08 ENCOUNTER — Other Ambulatory Visit: Payer: Self-pay

## 2016-08-08 DIAGNOSIS — E781 Pure hyperglyceridemia: Secondary | ICD-10-CM

## 2016-08-08 DIAGNOSIS — F1290 Cannabis use, unspecified, uncomplicated: Secondary | ICD-10-CM | POA: Insufficient documentation

## 2016-08-08 DIAGNOSIS — W548XXA Other contact with dog, initial encounter: Secondary | ICD-10-CM | POA: Insufficient documentation

## 2016-08-08 NOTE — Assessment & Plan Note (Signed)
Patient has been seeing psychiatrist; was getting Xanax 1 mg TID but apparently no SSRI or SNRI; I am not going to prescribe his Xanax and he will need to continue under the care of a psychiatrist for this type of medicine; he states he has been completely out of his medicine for a month, so withdrawal should not be an issue unless he has been obtaining it illicitly; he seemed restless today, so I don't know if there is other drug use that might be contributing to his anxiety

## 2016-08-08 NOTE — Assessment & Plan Note (Signed)
I explained to patient that this prevents me from being able to prescribe him any benzo, so he will need to obtain prescriptions to treat his anxiety from his psychiatrist; if not able to return to Dr. Rosine Door, then he can return to Dr. Kasandra Knudsen, or provider of his choice

## 2016-08-08 NOTE — Assessment & Plan Note (Signed)
Patient has plans to have this repaired soon; he should NOT stop his Factor Xa inhibitor abruptly for several days prior to surgery, as I think a bridge with Lovenox right up until the operation would be safer for him, given the extent of his prior PE with infarction; he should start back on his Xarelto immediately after surgery if surgeon agrees

## 2016-08-08 NOTE — Assessment & Plan Note (Signed)
Advised patient to keep a very close eye on this finger/wound; cleaned and covered with antibiotic ointment; tetanus booster given today; discussed P. multocida, risk of serious complications if infection and then gets into the extensor tendon; patient agrees to keep clean and watch closely

## 2016-08-19 ENCOUNTER — Encounter
Admission: RE | Admit: 2016-08-19 | Discharge: 2016-08-19 | Disposition: A | Discharge status: Home / Self Care | Payer: Medicare Other | Source: Ambulatory Visit | Attending: Orthopedic Surgery | Admitting: Orthopedic Surgery

## 2016-08-19 DIAGNOSIS — I4510 Unspecified right bundle-branch block: Secondary | ICD-10-CM | POA: Diagnosis not present

## 2016-08-19 DIAGNOSIS — M67441 Ganglion, right hand: Secondary | ICD-10-CM | POA: Insufficient documentation

## 2016-08-19 DIAGNOSIS — Z01818 Encounter for other preprocedural examination: Secondary | ICD-10-CM | POA: Insufficient documentation

## 2016-08-19 DIAGNOSIS — I10 Essential (primary) hypertension: Secondary | ICD-10-CM | POA: Diagnosis not present

## 2016-08-19 HISTORY — DX: Major depressive disorder, single episode, unspecified: F32.9

## 2016-08-19 HISTORY — DX: Unspecified osteoarthritis, unspecified site: M19.90

## 2016-08-19 HISTORY — DX: Depression, unspecified: F32.A

## 2016-08-19 HISTORY — DX: Malignant (primary) neoplasm, unspecified: C80.1

## 2016-08-19 HISTORY — DX: Chronic obstructive pulmonary disease, unspecified: J44.9

## 2016-08-19 HISTORY — DX: Other psychoactive substance abuse, in remission: F19.11

## 2016-08-19 HISTORY — DX: Prediabetes: R73.03

## 2016-08-19 HISTORY — DX: Coagulation defect, unspecified: D68.9

## 2016-08-19 HISTORY — DX: Alcohol abuse, in remission: F10.11

## 2016-08-19 HISTORY — DX: Inflammatory liver disease, unspecified: K75.9

## 2016-08-19 HISTORY — DX: Cerebral infarction, unspecified: I63.9

## 2016-08-19 NOTE — Patient Instructions (Signed)
Your procedure is scheduled on: Thursday 08/28/16 Report to Day Surgery. 2ND FLOOR MEDICAL MALL ENTRANCE To find out your arrival time please call [phone] between 1PM - 3PM on Wednesday 08/27/16.  Remember: Instructions that are not followed completely may result in serious medical risk, up to and including death, or upon the discretion of your surgeon and anesthesiologist your surgery may need to be rescheduled.    __X__ 1. Do not eat food or drink liquids after midnight. No gum chewing or hard candies.     __X__ 2. No Alcohol for 24 hours before or after surgery.   ____ 3. Bring all medications with you on the day of surgery if instructed.    __X__ 4. Notify your doctor if there is any change in your medical condition     (cold, fever, infections).     Do not wear jewelry, make-up, hairpins, clips or nail polish.  Do not wear lotions, powders, or perfumes.   Do not shave 48 hours prior to surgery. Men may shave face and neck.  Do not bring valuables to the hospital.    [facility] is not responsible for any belongings or valuables.               Contacts, dentures or bridgework may not be worn into surgery.  Leave your suitcase in the car. After surgery it may be brought to your room.  For patients admitted to the hospital, discharge time is determined by your                treatment team.   Patients discharged the day of surgery will not be allowed to drive home.   Please read over the following fact sheets that you were given:   Pain Booklet   ____ Take these medicines the morning of surgery with A SIP OF WATER:    1. NONE  2.   3.   4.  5.  6.  ____ Fleet Enema (as directed)   __X__ Use CHG Soap as directed  ____ Use inhalers on the day of surgery  ____ Stop metformin 2 days prior to surgery    ____ Take 1/2 of usual insulin dose the night before surgery and none on the morning of surgery.   __X__ Stop Coumadin/Plavix/aspirin on CONTACT DR LADDA / DR Rudene Christians  REGARDING ABILITY STOP XARELTO  ____ Stop Anti-inflammatories on    ____ Stop supplements until after surgery.    ____ Bring C-Pap to the hospital.

## 2016-08-28 ENCOUNTER — Ambulatory Visit
Admission: RE | Admit: 2016-08-28 | Discharge: 2016-08-28 | Disposition: A | Discharge status: Home / Self Care | Payer: Medicare Other | Source: Ambulatory Visit | Attending: Orthopedic Surgery | Admitting: Orthopedic Surgery

## 2016-08-28 ENCOUNTER — Encounter
Admission: RE | Disposition: A | Discharge status: Home / Self Care | Payer: Self-pay | Source: Ambulatory Visit | Attending: Orthopedic Surgery

## 2016-08-28 DIAGNOSIS — Z538 Procedure and treatment not carried out for other reasons: Secondary | ICD-10-CM | POA: Insufficient documentation

## 2016-08-28 DIAGNOSIS — M67441 Ganglion, right hand: Secondary | ICD-10-CM | POA: Diagnosis not present

## 2016-08-28 SURGERY — EXCISION, GANGLION CYST, WRIST
Anesthesia: Choice | Laterality: Right

## 2016-08-28 MED ORDER — FAMOTIDINE 20 MG PO TABS: 20.0000 mg | ORAL_TABLET | Freq: Once | ORAL | Status: DC

## 2016-08-28 MED ORDER — LACTATED RINGERS IV SOLN: INTRAVENOUS | Status: DC

## 2016-08-28 MED ORDER — BUPIVACAINE HCL (PF) 0.5 % IJ SOLN
INTRAMUSCULAR | Status: AC
  Filled 2016-08-28: qty 30

## 2016-08-28 MED ORDER — FAMOTIDINE 20 MG PO TABS
ORAL_TABLET | ORAL | Status: AC
  Filled 2016-08-28: qty 1

## 2016-08-28 SURGICAL SUPPLY — 31 items
BANDAGE ELASTIC 3 LF NS (GAUZE/BANDAGES/DRESSINGS) ×2 IMPLANT
BNDG ESMARK 4X12 TAN STRL LF (GAUZE/BANDAGES/DRESSINGS) ×2 IMPLANT
CANISTER SUCT 1200ML W/VALVE (MISCELLANEOUS) ×2 IMPLANT
CAST PADDING 3X4FT ST 30246 (SOFTGOODS) ×1
CHLORAPREP W/TINT 26ML (MISCELLANEOUS) ×2 IMPLANT
CUFF TOURN 18 STER (MISCELLANEOUS) IMPLANT
ELECT CAUTERY NEEDLE 2.0 MIC (NEEDLE) ×2 IMPLANT
GAUZE PETRO XEROFOAM 1X8 (MISCELLANEOUS) ×2 IMPLANT
GAUZE SPONGE 4X4 12PLY STRL (GAUZE/BANDAGES/DRESSINGS) ×2 IMPLANT
GLOVE BIOGEL PI IND STRL 9 (GLOVE) ×1 IMPLANT
GLOVE BIOGEL PI INDICATOR 9 (GLOVE) ×1
GLOVE SURG SYN 9.0  PF PI (GLOVE) ×1
GLOVE SURG SYN 9.0 PF PI (GLOVE) ×1 IMPLANT
GOWN SRG 2XL LVL 4 RGLN SLV (GOWNS) ×1 IMPLANT
GOWN STRL NON-REIN 2XL LVL4 (GOWNS) ×1
GOWN STRL REUS W/ TWL LRG LVL3 (GOWN DISPOSABLE) ×1 IMPLANT
GOWN STRL REUS W/TWL LRG LVL3 (GOWN DISPOSABLE) ×1
KIT RM TURNOVER STRD PROC AR (KITS) ×2 IMPLANT
NEEDLE HYPO 25X1 1.5 SAFETY (NEEDLE) ×2 IMPLANT
NS IRRIG 500ML POUR BTL (IV SOLUTION) ×2 IMPLANT
PACK EXTREMITY ARMC (MISCELLANEOUS) ×2 IMPLANT
PAD CAST CTTN 3X4 STRL (SOFTGOODS) ×1 IMPLANT
SPLINT CAST 1 STEP 3X12 (MISCELLANEOUS) ×2 IMPLANT
STOCKINETTE STRL 4IN 9604848 (GAUZE/BANDAGES/DRESSINGS) ×2 IMPLANT
SUT ETHILON 4-0 (SUTURE) ×1
SUT ETHILON 4-0 FS2 18XMFL BLK (SUTURE) ×1
SUT ETHILON 5-0 FS-2 18 BLK (SUTURE) ×2 IMPLANT
SUT MNCRL 4-0 (SUTURE) ×1
SUT MNCRL 4-018XMFL (SUTURE) ×1
SUTURE ETHLN 4-0 FS2 18XMF BLK (SUTURE) ×1 IMPLANT
SUTURE MNCRL 4-018XMF (SUTURE) ×1 IMPLANT

## 2016-08-28 NOTE — Progress Notes (Signed)
Pt. Admits to drug use as recent as last night , cocaine and cannabinoid use , refuses UDS , case cancelled pt. Instructed to call Dr. Rudene Christians office to reschedule.

## 2016-09-10 ENCOUNTER — Encounter
Admission: RE | Admit: 2016-09-10 | Discharge: 2016-09-10 | Disposition: A | Discharge status: Home / Self Care | Payer: Medicare Other | Source: Ambulatory Visit | Attending: Orthopedic Surgery | Admitting: Orthopedic Surgery

## 2016-09-10 DIAGNOSIS — Z01818 Encounter for other preprocedural examination: Secondary | ICD-10-CM | POA: Diagnosis not present

## 2016-09-10 DIAGNOSIS — M67441 Ganglion, right hand: Secondary | ICD-10-CM | POA: Insufficient documentation

## 2016-09-10 HISTORY — DX: Anxiety disorder, unspecified: F41.9

## 2016-09-10 NOTE — Patient Instructions (Signed)
  Your procedure is scheduled on: September 18, 2016 (Thursday) Report to Same Day Surgery 2nd floor medical mall [facility] Entrance-take elevator on left to 2nd floor.  Check in with surgery information desk.) To find out your arrival time please call [phone] between 1PM - 3PM on September 17, 2016 (Wednesday)  Remember: Instructions that are not followed completely may result in serious medical risk, up to and including death, or upon the discretion of your surgeon and anesthesiologist your surgery may need to be rescheduled.    _x___ 1. Do not eat food or drink liquids after midnight. No gum chewing or hard candies.     __x__ 2. No Alcohol for 24 hours before or after surgery.   __x__3. No Smoking for 24 prior to surgery.   ____  4. Bring all medications with you on the day of surgery if instructed.    __x__ 5. Notify your doctor if there is any change in your medical condition     (cold, fever, infections).     Do not wear jewelry, make-up, hairpins, clips or nail polish.  Do not wear lotions, powders, or perfumes. You may wear deodorant.  Do not shave 48 hours prior to surgery. Men may shave face and neck.  Do not bring valuables to the hospital.    [facility] is not responsible for any belongings or valuables.               Contacts, dentures or bridgework may not be worn into surgery.  Leave your suitcase in the car. After surgery it may be brought to your room.  For patients admitted to the hospital, discharge time is determined by your treatment team.   Patients discharged the day of surgery will not be allowed to drive home.  You will need someone to drive you home and stay with you the night of your procedure.    Please read over the following fact sheets that you were given:   [facility] Preparing for Surgery and or MRSA Information   __ Take these medicines the morning of surgery with A SIP OF WATER:    1.   2.  3.  4.  5.  6.  ____Fleets enema or  Magnesium Citrate as directed.   _x___ Use CHG Soap or sage wipes as directed on instruction sheet   x_ Use inhalers on the day of surgery and bring to hospital day of surgery (Use Albuterol inhaler the morning of surgery and bring to hospital)  ____ Stop metformin 2 days prior to surgery    ____ Take 1/2 of usual insulin dose the night before surgery and none on the morning of surgery.            __x_ Stop Aspirin, Coumadin, Pllavix ,Eliquis, Effient, or Pradaxa (Continue Xarelto)  x__ Stop Anti-inflammatories such as Advil, Aleve, Ibuprofen, Motrin, Naproxen,          Naprosyn, Goodies powders or aspirin products. Ok to take Tylenol.   ____ Stop supplements until after surgery.    ____ Bring C-Pap to the hospital.

## 2016-09-18 ENCOUNTER — Encounter
Admission: RE | Disposition: A | Discharge status: Home / Self Care | Payer: Self-pay | Source: Ambulatory Visit | Attending: Orthopedic Surgery

## 2016-09-18 ENCOUNTER — Ambulatory Visit: Payer: Medicare Other | Admitting: Anesthesiology

## 2016-09-18 ENCOUNTER — Encounter: Payer: Self-pay | Admitting: *Deleted

## 2016-09-18 ENCOUNTER — Ambulatory Visit
Admission: RE | Admit: 2016-09-18 | Discharge: 2016-09-18 | Disposition: A | Discharge status: Home / Self Care | Payer: Medicare Other | Source: Ambulatory Visit | Attending: Orthopedic Surgery | Admitting: Orthopedic Surgery

## 2016-09-18 DIAGNOSIS — Z86718 Personal history of other venous thrombosis and embolism: Secondary | ICD-10-CM | POA: Insufficient documentation

## 2016-09-18 DIAGNOSIS — Z79899 Other long term (current) drug therapy: Secondary | ICD-10-CM | POA: Insufficient documentation

## 2016-09-18 DIAGNOSIS — D2111 Benign neoplasm of connective and other soft tissue of right upper limb, including shoulder: Secondary | ICD-10-CM | POA: Diagnosis not present

## 2016-09-18 DIAGNOSIS — Z8673 Personal history of transient ischemic attack (TIA), and cerebral infarction without residual deficits: Secondary | ICD-10-CM | POA: Diagnosis not present

## 2016-09-18 DIAGNOSIS — J449 Chronic obstructive pulmonary disease, unspecified: Secondary | ICD-10-CM | POA: Diagnosis not present

## 2016-09-18 DIAGNOSIS — Z7901 Long term (current) use of anticoagulants: Secondary | ICD-10-CM | POA: Diagnosis not present

## 2016-09-18 DIAGNOSIS — M67441 Ganglion, right hand: Secondary | ICD-10-CM | POA: Insufficient documentation

## 2016-09-18 DIAGNOSIS — Z86711 Personal history of pulmonary embolism: Secondary | ICD-10-CM | POA: Diagnosis not present

## 2016-09-18 DIAGNOSIS — M6780 Other specified disorders of synovium and tendon, unspecified site: Secondary | ICD-10-CM | POA: Diagnosis not present

## 2016-09-18 DIAGNOSIS — K760 Fatty (change of) liver, not elsewhere classified: Secondary | ICD-10-CM | POA: Insufficient documentation

## 2016-09-18 DIAGNOSIS — I739 Peripheral vascular disease, unspecified: Secondary | ICD-10-CM | POA: Diagnosis not present

## 2016-09-18 DIAGNOSIS — I10 Essential (primary) hypertension: Secondary | ICD-10-CM | POA: Diagnosis not present

## 2016-09-18 HISTORY — PX: EXCISION METACARPAL MASS: SHX6372

## 2016-09-18 LAB — URINE DRUG SCREEN, QUALITATIVE (ARMC ONLY)
Amphetamines, Ur Screen: NOT DETECTED
Barbiturates, Ur Screen: NOT DETECTED
Benzodiazepine, Ur Scrn: NOT DETECTED
Cannabinoid 50 Ng, Ur ~~LOC~~: NOT DETECTED
Cocaine Metabolite,Ur ~~LOC~~: NOT DETECTED
MDMA (Ecstasy)Ur Screen: NOT DETECTED
Methadone Scn, Ur: NOT DETECTED
Opiate, Ur Screen: NOT DETECTED
Phencyclidine (PCP) Ur S: NOT DETECTED
Tricyclic, Ur Screen: NOT DETECTED

## 2016-09-18 LAB — GLUCOSE, CAPILLARY: Glucose-Capillary: 162 mg/dL — ABNORMAL HIGH (ref 65–99)

## 2016-09-18 SURGERY — EXCISION METACARPAL MASS
Anesthesia: General | Site: Finger | Laterality: Right | Wound class: Clean

## 2016-09-18 MED ORDER — LIDOCAINE 2% (20 MG/ML) 5 ML SYRINGE
INTRAMUSCULAR | Status: AC
  Filled 2016-09-18: qty 5

## 2016-09-18 MED ORDER — MIDAZOLAM HCL 2 MG/2ML IJ SOLN
INTRAMUSCULAR | Status: DC | PRN
  Administered 2016-09-18: 2 mg via INTRAVENOUS

## 2016-09-18 MED ORDER — FENTANYL CITRATE (PF) 100 MCG/2ML IJ SOLN: 25.0000 ug | INTRAMUSCULAR | Status: DC | PRN

## 2016-09-18 MED ORDER — BUPIVACAINE HCL (PF) 0.5 % IJ SOLN
INTRAMUSCULAR | Status: DC | PRN
  Administered 2016-09-18: 20 mL

## 2016-09-18 MED ORDER — OXYCODONE HCL 5 MG PO TABS: 5.0000 mg | ORAL_TABLET | Freq: Once | ORAL | Status: DC | PRN

## 2016-09-18 MED ORDER — NEOMYCIN-POLYMYXIN B GU 40-200000 IR SOLN
Status: DC | PRN
  Administered 2016-09-18: 2 mL

## 2016-09-18 MED ORDER — ONDANSETRON HCL 4 MG/2ML IJ SOLN
INTRAMUSCULAR | Status: DC | PRN
Start: 2016-09-18 — End: 2016-09-18
  Administered 2016-09-18: 4 mg via INTRAVENOUS

## 2016-09-18 MED ORDER — FENTANYL CITRATE (PF) 100 MCG/2ML IJ SOLN
INTRAMUSCULAR | Status: AC
  Filled 2016-09-18: qty 2

## 2016-09-18 MED ORDER — BUPIVACAINE HCL (PF) 0.5 % IJ SOLN
INTRAMUSCULAR | Status: AC
  Filled 2016-09-18: qty 30

## 2016-09-18 MED ORDER — SODIUM CHLORIDE 0.9 % IV SOLN
INTRAVENOUS | Status: DC
Start: 2016-09-18 — End: 2016-09-18
  Administered 2016-09-18: 11:00:00 via INTRAVENOUS

## 2016-09-18 MED ORDER — FAMOTIDINE 20 MG PO TABS
ORAL_TABLET | ORAL | Status: AC
  Filled 2016-09-18: qty 1

## 2016-09-18 MED ORDER — OXYCODONE HCL 5 MG/5ML PO SOLN: 5.0000 mg | Freq: Once | ORAL | Status: DC | PRN

## 2016-09-18 MED ORDER — FENTANYL CITRATE (PF) 100 MCG/2ML IJ SOLN
INTRAMUSCULAR | Status: DC | PRN
  Administered 2016-09-18: 100 ug via INTRAVENOUS

## 2016-09-18 MED ORDER — LACTATED RINGERS IV SOLN
INTRAVENOUS | Status: DC | PRN
  Administered 2016-09-18: 12:00:00 via INTRAVENOUS

## 2016-09-18 MED ORDER — MIDAZOLAM HCL 2 MG/2ML IJ SOLN
INTRAMUSCULAR | Status: AC
  Filled 2016-09-18: qty 2

## 2016-09-18 MED ORDER — DEXAMETHASONE SODIUM PHOSPHATE 10 MG/ML IJ SOLN
INTRAMUSCULAR | Status: DC | PRN
  Administered 2016-09-18: 10 mg via INTRAVENOUS

## 2016-09-18 MED ORDER — FAMOTIDINE 20 MG PO TABS
20.0000 mg | ORAL_TABLET | Freq: Once | ORAL | Status: AC
  Administered 2016-09-18: 20 mg via ORAL

## 2016-09-18 MED ORDER — HYDROCODONE-ACETAMINOPHEN 5-325 MG PO TABS: 1.0000 | ORAL_TABLET | Freq: Four times a day (QID) | ORAL | 0 refills | Status: DC | PRN

## 2016-09-18 MED ORDER — LIDOCAINE HCL (CARDIAC) 20 MG/ML IV SOLN
INTRAVENOUS | Status: DC | PRN
  Administered 2016-09-18: 100 mg via INTRAVENOUS

## 2016-09-18 MED ORDER — NEOMYCIN-POLYMYXIN B GU 40-200000 IR SOLN
Status: AC
  Filled 2016-09-18: qty 2

## 2016-09-18 MED ORDER — PROPOFOL 10 MG/ML IV BOLUS
INTRAVENOUS | Status: AC
  Filled 2016-09-18: qty 20

## 2016-09-18 MED ORDER — PROPOFOL 10 MG/ML IV BOLUS
INTRAVENOUS | Status: DC | PRN
  Administered 2016-09-18: 200 mg via INTRAVENOUS

## 2016-09-18 MED ORDER — CEFAZOLIN SODIUM-DEXTROSE 2-4 GM/100ML-% IV SOLN
2.0000 g | Freq: Once | INTRAVENOUS | Status: AC
  Administered 2016-09-18: 2 g via INTRAVENOUS

## 2016-09-18 SURGICAL SUPPLY — 32 items
CANISTER SUCT 1200ML W/VALVE (MISCELLANEOUS) ×2 IMPLANT
CUFF TOURN 18 STER (MISCELLANEOUS) ×2 IMPLANT
ELECT CAUTERY NEEDLE 2.0 MIC (NEEDLE) ×2 IMPLANT
ELECT REM PT RETURN 9FT ADLT (ELECTROSURGICAL) ×2
ELECTRODE REM PT RTRN 9FT ADLT (ELECTROSURGICAL) ×1 IMPLANT
GAUZE PETRO XEROFOAM 1X8 (MISCELLANEOUS) ×2 IMPLANT
GAUZE SPONGE 4X4 12PLY STRL (GAUZE/BANDAGES/DRESSINGS) IMPLANT
GAUZE SPONGE NON-WVN 2X2 STRL (MISCELLANEOUS) ×2 IMPLANT
GAUZE STRETCH 2X75IN STRL (MISCELLANEOUS) ×2 IMPLANT
GLOVE BIO SURGEON STRL SZ7 (GLOVE) ×6 IMPLANT
GLOVE INDICATOR 7.5 STRL GRN (GLOVE) ×4 IMPLANT
GLOVE SURG SYN 9.0  PF PI (GLOVE) ×1
GLOVE SURG SYN 9.0 PF PI (GLOVE) ×1 IMPLANT
GOWN SRG 2XL LVL 4 RGLN SLV (GOWNS) ×1 IMPLANT
GOWN STRL NON-REIN 2XL LVL4 (GOWNS) ×1
GOWN STRL REUS W/ TWL LRG LVL3 (GOWN DISPOSABLE) ×2 IMPLANT
GOWN STRL REUS W/TWL LRG LVL3 (GOWN DISPOSABLE) ×2
KIT RM TURNOVER STRD PROC AR (KITS) ×2 IMPLANT
NEEDLE FILTER BLUNT 18X 1/2SAF (NEEDLE) ×1
NEEDLE FILTER BLUNT 18X1 1/2 (NEEDLE) ×1 IMPLANT
NEEDLE HYPO 25X1 1.5 SAFETY (NEEDLE) ×2 IMPLANT
NS IRRIG 500ML POUR BTL (IV SOLUTION) ×2 IMPLANT
PACK EXTREMITY ARMC (MISCELLANEOUS) ×2 IMPLANT
SPONGE VERSALON 2X2 STRL (MISCELLANEOUS) ×2
STOCKINETTE STRL 6IN 960660 (GAUZE/BANDAGES/DRESSINGS) ×2 IMPLANT
SUT ETHILON 4-0 (SUTURE) ×1
SUT ETHILON 4-0 FS2 18XMFL BLK (SUTURE) ×1
SUT ETHILON 5 0 CL P 3 (SUTURE) ×2 IMPLANT
SUT ETHILON 5 0 P 3 18 (SUTURE) ×1
SUT NYLON ETHILON 5-0 P-3 1X18 (SUTURE) ×1 IMPLANT
SUTURE ETHLN 4-0 FS2 18XMF BLK (SUTURE) ×1 IMPLANT
SYR 5ML LL (SYRINGE) ×2 IMPLANT

## 2016-09-18 NOTE — Transfer of Care (Signed)
Immediate Anesthesia Transfer of Care Note  Patient: Randall Harvey.  Procedure(s) Performed: Procedure(s) with comments: EXCISION METACARPAL MASS (Right) - 5th digit   Patient Location: PACU  Anesthesia Type:General  Level of Consciousness: sedated  Airway & Oxygen Therapy: Patient Spontanous Breathing and Patient connected to face mask oxygen  Post-op Assessment: Report given to RN and Post -op Vital signs reviewed and stable  Post vital signs: Reviewed and stable  Last Vitals:  Vitals:   09/18/16 1005  BP: (!) 141/93  Pulse: (!) 103  Resp: 16  Temp: 36.7 C    Last Pain:  Vitals:   09/18/16 1005  TempSrc: Tympanic         Complications: No apparent anesthesia complications

## 2016-09-18 NOTE — Anesthesia Postprocedure Evaluation (Signed)
Anesthesia Post Note  Patient: Randall Harvey.  Procedure(s) Performed: Procedure(s) (LRB): EXCISION METACARPAL MASS (Right)  Patient location during evaluation: PACU Anesthesia Type: General Level of consciousness: awake and alert Pain management: pain level controlled Vital Signs Assessment: post-procedure vital signs reviewed and stable Respiratory status: spontaneous breathing, nonlabored ventilation, respiratory function stable and patient connected to nasal cannula oxygen Cardiovascular status: blood pressure returned to baseline and stable Postop Assessment: no signs of nausea or vomiting Anesthetic complications: no     Last Vitals:  Vitals:   09/18/16 1320 09/18/16 1332  BP: (!) 142/86 (!) 141/94  Pulse: 72 73  Resp: 17 16  Temp: 36.7 C     Last Pain:  Vitals:   09/18/16 1332  TempSrc: Oral  PainSc: 0-No pain                 Precious Haws Piscitello

## 2016-09-18 NOTE — Op Note (Signed)
09/18/2016  12:48 PM  PATIENT:  Randall Harvey.  55 y.o. male  PRE-OPERATIVE DIAGNOSIS:  ganglion cyst of flexor tendon  POST-OPERATIVE DIAGNOSIS:  ganglion cyst of flexor tendon  PROCEDURE:  Procedure(s) with comments: EXCISION METACARPAL MASS (Right) - 5th digit   SURGEON: Laurene Footman, MD  ASSISTANTS: None  ANESTHESIA:   general  EBL:  No intake/output data recorded.  BLOOD ADMINISTERED:none  DRAINS: none   LOCAL MEDICATIONS USED:  MARCAINE     SPECIMEN:  Source of Specimen:  Mass from volar surface middle phalanx right little finger  DISPOSITION OF SPECIMEN:  PATHOLOGY  COUNTS:  YES  TOURNIQUET:   9 minutes at 250 mm mercury  IMPLANTS: None  DICTATION: .Dragon Dictation patient was brought to the operating room and after adequate general anesthesia was obtained, the right arm was prepped and draped in usual sterile fashion. After patient identification and timeout procedure were completed tourniquet was raised to her 50 murmurs mercury. Additionally prior to inflation of the tourniquet a digital block was placed for postop analgesia with half percent Sensorcaine plain 10 cc on each side. A zigzag incision was made over the middle and proximal phalanges volarly with care being taken to protect the neurovascular bundles. This flap was elevated and the mass was identified over the middle phalanx it was firm white mass that appeared attached to the flexor tendon sheath. It was separated from surrounding tissue without difficulty and removed in 2 large pieces and sent to pathology as a specimen. There did not appear to be a residual mass and there is a small opening to the flexor tendon sheath which was opened slightly more in case as well as front of ganglion of flexor tendon sheath. The wound was then irrigated and the tourniquet let down hemostasis was checked electrocautery the nerve digital arteries were intact. Was then closed with simple interrupted 5-0 nylon skin closure  with Xeroform 2 x 2's and a finger roll applied.  PLAN OF CARE: Discharge to home after PACU  PATIENT DISPOSITION:  PACU - hemodynamically stable.

## 2016-09-18 NOTE — H&P (Signed)
Reviewed paper H+P, will be scanned into chart. No changes noted.  

## 2016-09-18 NOTE — Anesthesia Preprocedure Evaluation (Addendum)
Anesthesia Evaluation  Patient identified by MRN, date of birth, ID band Patient awake    Reviewed: Allergy & Precautions, H&P , NPO status , Patient's Chart, lab work & pertinent test results  History of Anesthesia Complications Negative for: history of anesthetic complications  Airway Mallampati: III  TM Distance: >3 FB Neck ROM: full    Dental  (+) Poor Dentition   Pulmonary shortness of breath, COPD, Current Smoker,    Pulmonary exam normal breath sounds clear to auscultation       Cardiovascular Exercise Tolerance: Good hypertension, (-) angina+ Peripheral Vascular Disease  (-) Past MI and (-) DOE Normal cardiovascular exam Rhythm:regular Rate:Normal     Neuro/Psych PSYCHIATRIC DISORDERS Anxiety Depression CVA    GI/Hepatic negative GI ROS, (+)     substance abuse  alcohol use, cocaine use, marijuana use and IV drug use, Hepatitis -  Endo/Other  negative endocrine ROS  Renal/GU      Musculoskeletal  (+) Arthritis ,   Abdominal   Peds  Hematology negative hematology ROS (+)   Anesthesia Other Findings Past Medical History: No date: Anticoagulant long-term use     Comment: lifetime use, coumadin therapy, then Xarelto No date: Anxiety No date: Arthritis No date: Cancer [facility])     Comment: renal No date: Carotid atherosclerosis     Comment: <50% left and right No date: Clotting disorder (H[facility]) No date: COPD (chronic obstructive pulmonary disease) (* No date: Depression March 2015: Elevated liver enzymes No date: Fatty liver No date: H/O drug abuse No date: H/O ETOH abuse No date: Hepatitis No date: Hypertension No date: Obesity Oct 2014: Personal history of DVT (deep vein thrombosis) No date: Pre-diabetes Oct 2014: Pulmonary embolism and infarction [facility]) March 2015: Pulmonary embolism and infarction [facility]) Oct 2014: Renal mass, left     Comment: with ureteral obstruction No date: Stroke [facility]) No  date: Thrombocytopenia ([place]) No date: Tobacco abuse  Past Surgical History: 12/2012: ANKLE FRACTURE SURGERY Right No date: HERNIA REPAIR     Comment: Umbilical Hernia No date: ORBITAL FRACTURE SURGERY Left Oct 2014: ROBOTIC ASSITED PARTIAL NEPHRECTOMY Left  BMI    Body Mass Index:  29.60 kg/m      Reproductive/Obstetrics negative OB ROS                             Anesthesia Physical Anesthesia Plan  ASA: III  Anesthesia Plan: General LMA   Post-op Pain Management:    Induction:   Airway Management Planned:   Additional Equipment:   Intra-op Plan:   Post-operative Plan:   Informed Consent: I have reviewed the patients History and Physical, chart, labs and discussed the procedure including the risks, benefits and alternatives for the proposed anesthesia with the patient or authorized representative who has indicated his/her understanding and acceptance.   Dental Advisory Given  Plan Discussed with: Anesthesiologist, CRNA and Surgeon  Anesthesia Plan Comments:        Anesthesia Quick Evaluation

## 2016-09-18 NOTE — OR Nursing (Signed)
Ancef 2 gm sent to OR with patient

## 2016-09-18 NOTE — Discharge Instructions (Addendum)
Keep dressing clean and dry. Okay to work the hand. If there is bloody drainage reinforced with more gauze. Pain medicine as directed. Expect numbness in the finger at least today including the ring finger because of local anesthetic given for pain control today.    AMBULATORY SURGERY  DISCHARGE INSTRUCTIONS   1) The drugs that you were given will stay in your system until tomorrow so for the next 24 hours you should not:  A) Drive an automobile B) Make any legal decisions C) Drink any alcoholic beverage   2) You may resume regular meals tomorrow.  Today it is better to start with liquids and gradually work up to solid foods.  You may eat anything you prefer, but it is better to start with liquids, then soup and crackers, and gradually work up to solid foods.   3) Please notify your doctor immediately if you have any unusual bleeding, trouble breathing, redness and pain at the surgery site, drainage, fever, or pain not relieved by medication.    4) Additional Instructions:        Please contact your physician with any problems or Same Day Surgery at [phone], Monday through Friday 6 am to 4 pm, or [place] at [facility] number at [phone].

## 2016-09-19 ENCOUNTER — Encounter: Payer: Self-pay | Admitting: Orthopedic Surgery

## 2016-09-19 LAB — SURGICAL PATHOLOGY

## 2016-09-25 ENCOUNTER — Other Ambulatory Visit: Payer: Self-pay | Admitting: Family Medicine

## 2016-09-26 ENCOUNTER — Other Ambulatory Visit: Payer: Self-pay

## 2016-09-26 MED ORDER — RIVAROXABAN 20 MG PO TABS: 20.0000 mg | ORAL_TABLET | Freq: Every day | ORAL | 1 refills | Status: DC

## 2016-10-01 ENCOUNTER — Other Ambulatory Visit: Payer: Self-pay | Admitting: Family Medicine

## 2016-10-01 MED ORDER — RIVAROXABAN 20 MG PO TABS: 20.0000 mg | ORAL_TABLET | Freq: Every day | ORAL | 1 refills | Status: DC

## 2016-10-01 NOTE — Telephone Encounter (Signed)
Glitch in our system; I was not receiving electronic refill requests from Dec 13 until yesterday; addressed with IT; addressing now ------------------------------------ Patient called this morning; I explained issue, sent in refills of xarelto

## 2017-04-10 DIAGNOSIS — M25561 Pain in right knee: Secondary | ICD-10-CM | POA: Diagnosis not present

## 2017-04-10 DIAGNOSIS — M25562 Pain in left knee: Secondary | ICD-10-CM | POA: Diagnosis not present

## 2017-04-10 DIAGNOSIS — G8929 Other chronic pain: Secondary | ICD-10-CM | POA: Diagnosis not present

## 2017-04-10 DIAGNOSIS — M170 Bilateral primary osteoarthritis of knee: Secondary | ICD-10-CM | POA: Diagnosis not present

## 2017-06-03 ENCOUNTER — Other Ambulatory Visit: Payer: Self-pay

## 2017-06-03 NOTE — Telephone Encounter (Signed)
Pt is completely out. Have not been seen since last year. Do you want to call in some to last him intill he can schedule an appt with Dr. Sanda Klein ?

## 2017-06-05 NOTE — Telephone Encounter (Signed)
Pt called back about his refill and was advised to schedule an appt and pt quoted " I will call back to schedule"

## 2017-10-07 ENCOUNTER — Other Ambulatory Visit: Payer: Self-pay | Admitting: Family Medicine

## 2017-10-07 NOTE — Telephone Encounter (Signed)
Patient has not been seen in over a year He needs an appointment I'll allow one week of medicine He's appears to be noncompliant with medicine, as a 6 month supply was prescribed in January 2018 and he is just now requesting refills (he would have run out in July)

## 2017-10-08 NOTE — Telephone Encounter (Signed)
Left voice mail

## 2017-12-03 ENCOUNTER — Other Ambulatory Visit: Payer: Self-pay | Admitting: Family Medicine

## 2017-12-03 ENCOUNTER — Telehealth: Payer: Self-pay | Admitting: Family Medicine

## 2017-12-03 NOTE — Telephone Encounter (Signed)
Copied from [place] [id number]. Topic: Quick Communication - See Telephone Encounter >> Dec 03, 2017 11:55 AM Conception Chancy, NT wrote: CRM for notification. See Telephone encounter for:  12/03/17.  Patient is calling and states he has not taken XARELTO 20 MG TABS tablet in 2 days because he is out. Patient scheduled a mediation follow up appt 12/14/17. Patient would like enough medication called in to last him to his appt.   [facility] [address]), [place] [address]  [place] ([place]) [place] [address]  Phone: [phone] Fax: [phone]

## 2017-12-03 NOTE — Telephone Encounter (Signed)
I'm sorry but the patient has not been seen since 2017 I'm going to recommend that he go now to urgent care for evaluation, possible labs, and refill He was asked to make an appt in September 2018 and again in January 2019 He needs an appointment to get any other medicines from [place] please

## 2017-12-03 NOTE — Telephone Encounter (Signed)
Pt notified about not getting the refill and that he should go to urgent care to be seen in order to receive some of the med until he can be seen here. Pt verbalized understanding and says that he will try to get to a urgent care to be seen.

## 2017-12-14 ENCOUNTER — Ambulatory Visit: Payer: Medicare Other | Admitting: Family Medicine

## 2018-01-05 ENCOUNTER — Ambulatory Visit (INDEPENDENT_AMBULATORY_CARE_PROVIDER_SITE_OTHER): Payer: PPO | Admitting: Family Medicine

## 2018-01-05 ENCOUNTER — Encounter: Payer: Self-pay | Admitting: Family Medicine

## 2018-01-05 VITALS — BP 116/78 | HR 92 | Temp 97.8°F | Ht 65.0 in | Wt 190.5 lb

## 2018-01-05 DIAGNOSIS — K760 Fatty (change of) liver, not elsewhere classified: Secondary | ICD-10-CM

## 2018-01-05 DIAGNOSIS — Z720 Tobacco use: Secondary | ICD-10-CM

## 2018-01-05 DIAGNOSIS — E6609 Other obesity due to excess calories: Secondary | ICD-10-CM

## 2018-01-05 DIAGNOSIS — R7303 Prediabetes: Secondary | ICD-10-CM | POA: Diagnosis not present

## 2018-01-05 DIAGNOSIS — I2782 Chronic pulmonary embolism: Secondary | ICD-10-CM | POA: Diagnosis not present

## 2018-01-05 DIAGNOSIS — J449 Chronic obstructive pulmonary disease, unspecified: Secondary | ICD-10-CM | POA: Diagnosis not present

## 2018-01-05 DIAGNOSIS — Z6831 Body mass index (BMI) 31.0-31.9, adult: Secondary | ICD-10-CM

## 2018-01-05 DIAGNOSIS — Z5181 Encounter for therapeutic drug level monitoring: Secondary | ICD-10-CM

## 2018-01-05 DIAGNOSIS — Z1211 Encounter for screening for malignant neoplasm of colon: Secondary | ICD-10-CM | POA: Diagnosis not present

## 2018-01-05 DIAGNOSIS — I82409 Acute embolism and thrombosis of unspecified deep veins of unspecified lower extremity: Secondary | ICD-10-CM | POA: Insufficient documentation

## 2018-01-05 DIAGNOSIS — C649 Malignant neoplasm of unspecified kidney, except renal pelvis: Secondary | ICD-10-CM

## 2018-01-05 DIAGNOSIS — S82891A Other fracture of right lower leg, initial encounter for closed fracture: Secondary | ICD-10-CM | POA: Insufficient documentation

## 2018-01-05 MED ORDER — RIVAROXABAN (XARELTO) VTE STARTER PACK (15 & 20 MG): ORAL_TABLET | ORAL | 0 refills | Status: DC

## 2018-01-05 MED ORDER — ALBUTEROL SULFATE HFA 108 (90 BASE) MCG/ACT IN AERS: 2.0000 | INHALATION_SPRAY | Freq: Four times a day (QID) | RESPIRATORY_TRACT | 2 refills | Status: DC | PRN

## 2018-01-05 NOTE — Progress Notes (Signed)
BP 116/78 (BP Location: Right Arm, Patient Position: Sitting, Cuff Size: Large)   Pulse 92   Temp 97.8 F (36.6 C) (Oral)   Ht 5\' 5"  (1.651 m)   Wt 190 lb 8 oz (86.4 kg)   SpO2 98%   BMI 31.70 kg/m    Subjective:    Patient ID: Randall Harvey., male    DOB: 26-Sep-1961, 57 y.o.   MRN: [id number]  HPI: Randall Harvey. is a 57 y.o. male  Chief Complaint  Patient presents with  . Medication Refill    HPI Patient is here for follow-up  Hx of DVT and PE; on Xarelto; he did not go to urgent care to get refill; out for a month; no bleeding from nose, gums, urine or stool; easy bruising; no SHOB or chest pain  Obesity; steady weight over the last 1.5 years; did manage to lose weight once, Atkins diet; got down to 165 pounds; good weight for him  Smoker; about the same, 1/2 ppd; around smokers, but no one smokes in the house; first cig is maybe an hour before smoking; last cig of the day might be longer than an hour; not smoking in the middle of the night; not 30 pack year smoker  COPD; coughing a little; bringing up a little bit of phlegm; activities are not limited  Depression screen [facility] 2/9 01/05/2018 08/07/2016 08/28/2015  Decreased Interest 0 2 2  Down, Depressed, Hopeless 0 1 1  PHQ - 2 Score 0 3 3  Altered sleeping - 1 -  Tired, decreased energy - 0 -  Change in appetite - 0 -  Feeling bad or failure about yourself  - 1 -  Trouble concentrating - 0 -  Moving slowly or fidgety/restless - 0 -  Suicidal thoughts - 0 -  PHQ-9 Score - 5 -  Difficult doing work/chores - Very difficult -    Relevant past medical, surgical, family and social history reviewed Past Medical History:  Diagnosis Date  . Anticoagulant long-term use    lifetime use, coumadin therapy, then Xarelto  . Anxiety   . Arthritis   . Cancer ([place])    renal  . Carotid atherosclerosis    <50% left and right  . Clotting disorder ([place])   . COPD (chronic obstructive pulmonary disease) ([place])   . Depression     . Elevated liver enzymes March 2015  . Fatty liver   . H/O drug abuse   . H/O ETOH abuse   . Hepatitis   . Hypertension   . Obesity   . Personal history of DVT (deep vein thrombosis) Oct 2014  . Pre-diabetes   . Pulmonary embolism and infarction [facility] Oct 2014  . Pulmonary embolism and infarction [facility]) March 2015  . Renal mass, left Oct 2014   with ureteral obstruction  . Stroke ([place])   . Thrombocytopenia ([place])   . Tobacco abuse    Past Surgical History:  Procedure Laterality Date  . ANKLE FRACTURE SURGERY Right 12/2012  . EXCISION METACARPAL MASS Right 09/18/2016   Procedure: EXCISION METACARPAL MASS;  Surgeon: Hessie Knows, MD;  Location: [facility] ORS;  Service: Orthopedics;  Laterality: Right;  5th digit   . HERNIA REPAIR     Umbilical Hernia  . ORBITAL FRACTURE SURGERY Left   . ROBOTIC ASSITED PARTIAL NEPHRECTOMY Left Oct 2014   Family History  Problem Relation Age of Onset  . Cancer Mother        pancreatic  .  Cancer Father        colon  . Diabetes Father   . Cancer Sister        colon  . COPD Sister   . Seizures Sister   . Cancer Paternal Grandmother        bone  . Stroke Paternal Grandfather   . Heart disease Paternal Grandfather   . Cancer Sister        ovarian  . Hypertension Neg Hx    Social History   Tobacco Use  . Smoking status: Current Every Day Smoker    Packs/day: 0.50    Types: Cigarettes  . Smokeless tobacco: Never Used  Substance Use Topics  . Alcohol use: Yes    Comment: occasionally  . Drug use: Yes    Types: Marijuana    Comment: occ  MD notes: maybe a week to two without any; had two last night; not large amount He says he wasn't smoking marijuana recently, but was around it this past weekend; he is aware locally some may be laced with fentanyl  Interim medical history since last visit reviewed. Allergies and medications reviewed  Review of Systems Per HPI unless specifically indicated above     Objective:    BP 116/78 (BP  Location: Right Arm, Patient Position: Sitting, Cuff Size: Large)   Pulse 92   Temp 97.8 F (36.6 C) (Oral)   Ht 5\' 5"  (1.651 m)   Wt 190 lb 8 oz (86.4 kg)   SpO2 98%   BMI 31.70 kg/m   Wt Readings from Last 3 Encounters:  01/05/18 190 lb 8 oz (86.4 kg)  09/18/16 189 lb (85.7 kg)  09/10/16 189 lb (85.7 kg)    Physical Exam  Constitutional: He appears well-developed and well-nourished. No distress.  HENT:  Head: Normocephalic and atraumatic.  Eyes: EOM are normal. No scleral icterus.  Neck: No thyromegaly present.  Cardiovascular: Normal rate and regular rhythm.  Pulmonary/Chest: Effort normal and breath sounds normal.  Abdominal: Soft. Bowel sounds are normal. He exhibits no distension.  Neurological: Coordination normal.  Skin: Skin is warm and dry. No pallor.  Psychiatric: He has a normal mood and affect. His behavior is normal. Judgment and thought content normal.      Assessment & Plan:   Problem List Items Addressed This Visit      Cardiovascular and Mediastinum   Pulmonary embolus ([place]) - Primary    Continue Xarelto, life-long anti-coagulation      Relevant Medications   Rivaroxaban 15 & 20 MG TBPK     Respiratory   COPD, mild ([place])    Encouraged patient to quit smoking      Relevant Medications   albuterol (VENTOLIN HFA) 108 (90 Base) MCG/ACT inhaler     Digestive   Fatty liver    Encouraged weight loss; check LFTs      Relevant Orders   COMPLETE METABOLIC PANEL WITH GFR     Genitourinary   Renal cell carcinoma ([place])    Follow-up with urologist        Other   Tobacco abuse    Encouraged weight loss; see AVS      Prediabetes    Check glucose and A1c (truly fasting)      Relevant Orders   Hemoglobin A1c   Obesity    Encouraged weight loss      Relevant Orders   Lipid panel    Other Visit Diagnoses    Encounter for screening colonoscopy  Relevant Orders   Ambulatory referral to Gastroenterology   Medication monitoring  encounter       Relevant Orders   CBC with Differential/Platelet       Follow up plan: Return in about 6 months (around 07/07/2018) for follow-up visit with Dr. Sanda Klein.  An after-visit summary was printed and given to the patient at [place].  Please see the patient instructions which may contain other information and recommendations beyond what is mentioned above in the assessment and plan.  Meds ordered this encounter  Medications  . Rivaroxaban 15 & 20 MG TBPK    Sig: Take as directed on package: Start with one 15mg  tablet by mouth twice a day with food. On Day 22, switch to one 20mg  tablet once a day with food.    Dispense:  51 each    Refill:  0  . albuterol (VENTOLIN HFA) 108 (90 Base) MCG/ACT inhaler    Sig: Inhale 2 puffs into the lungs every 6 (six) hours as needed for wheezing or shortness of breath.    Dispense:  1 Inhaler    Refill:  2    Orders Placed This Encounter  Procedures  . CBC with Differential/Platelet  . COMPLETE METABOLIC PANEL WITH GFR  . Lipid panel  . Hemoglobin A1c  . Ambulatory referral to Gastroenterology

## 2018-01-05 NOTE — Assessment & Plan Note (Signed)
Encouraged weight loss; check LFTs

## 2018-01-05 NOTE — Assessment & Plan Note (Signed)
Encouraged patient to quit smoking.  

## 2018-01-05 NOTE — Patient Instructions (Addendum)
Please follow-up with urologist regarding your history of kidney cancer I do encourage you to quit smoking Call [phone] to sign up for smoking cessation classes You can call 1-800-QUIT-NOW to talk with a smoking cessation coach Check out the information at familydoctor.org entitled "Nutrition for Weight Loss: What You Need to Know about Fad Diets" Try to lose between 1-2 pounds per week by taking in fewer calories and burning off more calories You can succeed by limiting portions, limiting foods dense in calories and fat, becoming more active, and drinking 8 glasses of water a day (64 ounces) Don't skip meals, especially breakfast, as skipping meals may alter your metabolism Do not use over-the-counter weight loss pills or gimmicks that claim rapid weight loss A healthy BMI (or body mass index) is between 18.5 and 24.9 You can calculate your ideal BMI at the [employer] website ClubMon[employer].fr Let's get labs today If you have not heard anything from my staff in a week about any orders/referrals/studies from today, please contact us here to follow-up [phone] When you have about a week to go on your Xarelto prescription, call your pharmacy for the next refill and that will be for the 20 mg strength

## 2018-01-05 NOTE — Assessment & Plan Note (Signed)
Check glucose and A1c (truly fasting) 

## 2018-01-05 NOTE — Assessment & Plan Note (Signed)
Continue  Xarelto, lifelong anti-coagulation 

## 2018-01-05 NOTE — Assessment & Plan Note (Signed)
Encouraged weight loss; see AVS 

## 2018-01-05 NOTE — Assessment & Plan Note (Signed)
Encouraged weight loss 

## 2018-01-05 NOTE — Assessment & Plan Note (Signed)
Follow-up with urologist

## 2018-01-06 ENCOUNTER — Other Ambulatory Visit: Payer: Self-pay | Admitting: Family Medicine

## 2018-01-06 LAB — COMPLETE METABOLIC PANEL WITH GFR
AG Ratio: 2 (calc) (ref 1.0–2.5)
ALT: 19 U/L (ref 9–46)
AST: 18 U/L (ref 10–35)
Albumin: 4.4 g/dL (ref 3.6–5.1)
Alkaline phosphatase (APISO): 75 U/L (ref 40–115)
BUN: 16 mg/dL (ref 7–25)
CO2: 24 mmol/L (ref 20–32)
Calcium: 8.8 mg/dL (ref 8.6–10.3)
Chloride: 108 mmol/L (ref 98–110)
Creat: 1.04 mg/dL (ref 0.70–1.33)
GFR, Est African American: 91 mL/min/{1.73_m2} (ref >=60)
GFR, Est Non African American: 78 mL/min/{1.73_m2} (ref >=60)
Globulin: 2.2 g/dL (calc) (ref 1.9–3.7)
Glucose, Bld: 144 mg/dL — ABNORMAL HIGH (ref 65–99)
Potassium: 4.5 mmol/L (ref 3.5–5.3)
Sodium: 139 mmol/L (ref 135–146)
Total Bilirubin: 0.5 mg/dL (ref 0.2–1.2)
Total Protein: 6.6 g/dL (ref 6.1–8.1)

## 2018-01-06 LAB — CBC WITH DIFFERENTIAL/PLATELET
Basophils Absolute: 37 cells/uL (ref 0–200)
Basophils Relative: 0.5 %
Eosinophils Absolute: 89 cells/uL (ref 15–500)
Eosinophils Relative: 1.2 %
HCT: 45.9 % (ref 38.5–50.0)
Hemoglobin: 16.1 g/dL (ref 13.2–17.1)
Lymphs Abs: 2227 cells/uL (ref 850–3900)
MCH: 31.9 pg (ref 27.0–33.0)
MCHC: 35.1 g/dL (ref 32.0–36.0)
MCV: 90.9 fL (ref 80.0–100.0)
MPV: 10 fL (ref 7.5–12.5)
Monocytes Relative: 5.7 %
Neutro Abs: 4625 cells/uL (ref 1500–7800)
Neutrophils Relative %: 62.5 %
Platelets: 191 10*3/uL (ref 140–400)
RBC: 5.05 10*6/uL (ref 4.20–5.80)
RDW: 11.8 % (ref 11.0–15.0)
Total Lymphocyte: 30.1 %
WBC mixed population: 422 cells/uL (ref 200–950)
WBC: 7.4 10*3/uL (ref 3.8–10.8)

## 2018-01-06 LAB — LIPID PANEL
Cholesterol: 136 mg/dL (ref <200)
HDL: 29 mg/dL — ABNORMAL LOW (ref >40)
LDL Cholesterol (Calc): 75 mg/dL (calc)
Non-HDL Cholesterol (Calc): 107 mg/dL (calc) (ref <130)
Total CHOL/HDL Ratio: 4.7 (calc) (ref <5.0)
Triglycerides: 233 mg/dL — ABNORMAL HIGH (ref <150)

## 2018-01-06 LAB — HEMOGLOBIN A1C
Hgb A1c MFr Bld: 6.4 % of total Hgb — ABNORMAL HIGH (ref <5.7)
Mean Plasma Glucose: 137 (calc)
eAG (mmol/L): 7.6 (calc)

## 2018-01-06 MED ORDER — METFORMIN HCL ER 500 MG PO TB24: ORAL_TABLET | ORAL | 0 refills | Status: DC

## 2018-01-06 NOTE — Progress Notes (Signed)
Metformin, return in 3 months

## 2018-01-25 ENCOUNTER — Telehealth: Payer: Self-pay

## 2018-01-25 NOTE — Telephone Encounter (Signed)
Gastroenterology Pre-Procedure Review  Request Date: 02/11/18 Requesting Physician: Dr. Marius Ditch    PATIENT REVIEW QUESTIONS: The patient responded to the following health history questions as indicated:    1. Are you having any GI issues? No  2. Do you have a personal history of Polyps? No  3. Do you have a family history of Colon Cancer or Polyps? Yes, Father Colon Cancer  4. Diabetes Mellitus? No  5. Joint replacements in the past 12 months? No  6. Major health problems in the past 3 months? No  7. Any artificial heart valves, MVP, or defibrillator? No     MEDICATIONS & ALLERGIES:    Patient reports the following regarding taking any anticoagulation/antiplatelet therapy:   Plavix, Coumadin, Eliquis, Xarelto, Lovenox, Pradaxa, Brilinta, or Effient? No  Aspirin? No   Patient confirms/reports the following medications:  Current Outpatient Medications  Medication Sig Dispense Refill  . rivaroxaban (XARELTO) 20 MG TABS tablet Take 20 mg by mouth daily with supper.     No current facility-administered medications for this visit.     Patient confirms/reports the following allergies:  No Known Allergies  No orders of the defined types were placed in this encounter.   AUTHORIZATION INFORMATION Primary Insurance: 1D#: Group #:  Secondary Insurance: 1D#: Group #:  SCHEDULE INFORMATION: Date: 02/11/18 Time: Location: [place]

## 2018-01-27 ENCOUNTER — Other Ambulatory Visit: Payer: Self-pay

## 2018-01-27 DIAGNOSIS — Z1211 Encounter for screening for malignant neoplasm of colon: Secondary | ICD-10-CM

## 2018-01-27 MED ORDER — NA SULFATE-K SULFATE-MG SULF 17.5-3.13-1.6 GM/177ML PO SOLN: 1.0000 | Freq: Once | ORAL | 0 refills | Status: AC

## 2018-02-08 ENCOUNTER — Telehealth: Payer: Self-pay | Admitting: Gastroenterology

## 2018-02-08 NOTE — Telephone Encounter (Signed)
Patient called to cancel procedure. He will call back when it's a better time. I called [facility].

## 2018-02-11 ENCOUNTER — Ambulatory Visit: Admit: 2018-02-11 | Payer: Medicare Other | Admitting: Gastroenterology

## 2018-02-11 SURGERY — COLONOSCOPY WITH PROPOFOL
Anesthesia: General

## 2018-02-23 ENCOUNTER — Other Ambulatory Visit: Payer: Self-pay | Admitting: Family Medicine

## 2018-05-03 ENCOUNTER — Emergency Department: Payer: Medicare HMO

## 2018-05-03 ENCOUNTER — Other Ambulatory Visit: Payer: Self-pay

## 2018-05-03 ENCOUNTER — Encounter: Payer: Self-pay | Admitting: Emergency Medicine

## 2018-05-03 ENCOUNTER — Telehealth: Payer: Self-pay | Admitting: Family Medicine

## 2018-05-03 ENCOUNTER — Emergency Department
Admission: EM | Admit: 2018-05-03 | Discharge: 2018-05-03 | Disposition: A | Discharge status: Home / Self Care | Payer: Medicare HMO | Attending: Emergency Medicine | Admitting: Emergency Medicine

## 2018-05-03 DIAGNOSIS — M79604 Pain in right leg: Secondary | ICD-10-CM

## 2018-05-03 DIAGNOSIS — R69 Illness, unspecified: Secondary | ICD-10-CM | POA: Diagnosis not present

## 2018-05-03 DIAGNOSIS — J449 Chronic obstructive pulmonary disease, unspecified: Secondary | ICD-10-CM | POA: Diagnosis not present

## 2018-05-03 DIAGNOSIS — F17210 Nicotine dependence, cigarettes, uncomplicated: Secondary | ICD-10-CM | POA: Diagnosis not present

## 2018-05-03 DIAGNOSIS — Z7901 Long term (current) use of anticoagulants: Secondary | ICD-10-CM | POA: Diagnosis not present

## 2018-05-03 DIAGNOSIS — M79605 Pain in left leg: Secondary | ICD-10-CM | POA: Insufficient documentation

## 2018-05-03 DIAGNOSIS — I10 Essential (primary) hypertension: Secondary | ICD-10-CM | POA: Insufficient documentation

## 2018-05-03 DIAGNOSIS — M79606 Pain in leg, unspecified: Secondary | ICD-10-CM

## 2018-05-03 LAB — BASIC METABOLIC PANEL
Anion gap: 10 (ref 5–15)
BUN: 10 mg/dL (ref 6–20)
CO2: 24 mmol/L (ref 22–32)
Calcium: 8.8 mg/dL — ABNORMAL LOW (ref 8.9–10.3)
Chloride: 105 mmol/L (ref 98–111)
Creatinine, Ser: 0.86 mg/dL (ref 0.61–1.24)
GFR calc Af Amer: >60 mL/min (ref >60)
GFR calc non Af Amer: >60 mL/min (ref >60)
Glucose, Bld: 145 mg/dL — ABNORMAL HIGH (ref 70–99)
Potassium: 4.1 mmol/L (ref 3.5–5.1)
Sodium: 139 mmol/L (ref 135–145)

## 2018-05-03 LAB — CBC
HCT: 47.1 % (ref 40.0–52.0)
Hemoglobin: 16.3 g/dL (ref 13.0–18.0)
MCH: 33 pg (ref 26.0–34.0)
MCHC: 34.7 g/dL (ref 32.0–36.0)
MCV: 95.3 fL (ref 80.0–100.0)
Platelets: 190 10*3/uL (ref 150–440)
RBC: 4.94 MIL/uL (ref 4.40–5.90)
RDW: 12.2 % (ref 11.5–14.5)
WBC: 8.9 10*3/uL (ref 3.8–10.6)

## 2018-05-03 LAB — TROPONIN I: Troponin I: <0.03 ng/mL (ref <0.03)

## 2018-05-03 NOTE — ED Notes (Signed)
Patient not in [place], checked outside and in men's room, unable to locate.

## 2018-05-03 NOTE — Discharge Instructions (Addendum)
You had no evidence of DVT on your Ultrasound of your legs

## 2018-05-03 NOTE — ED Provider Notes (Signed)
[facility] Emergency Department Provider Note   ____________________________________________    I have reviewed the triage vital signs and the nursing notes.   HISTORY  Chief Complaint Leg Pain     HPI Randall Harvey. is a 57 y.o. male present with complaints of leg pain.  Patient reports several days ago he had a brief cramp in his left leg and today he had some pain in his anterior right leg.  He is concerned because he has had difficulty refilling his Xarelto prescription and has had DVTs and PE in the past however both were after surgeries.  Denies calf swelling.  No shortness of breath or pleurisy.   Past Medical History:  Diagnosis Date  . Anticoagulant long-term use    lifetime use, coumadin therapy, then Xarelto  . Anxiety   . Arthritis   . Cancer ([place])    renal  . Carotid atherosclerosis    <50% left and right  . Clotting disorder ([place])   . COPD (chronic obstructive pulmonary disease) ([place])   . Depression   . Elevated liver enzymes March 2015  . Fatty liver   . H/O drug abuse   . H/O ETOH abuse   . Hepatitis   . Hypertension   . Obesity   . Personal history of DVT (deep vein thrombosis) Oct 2014  . Pre-diabetes   . Pulmonary embolism and infarction [facility]) Oct 2014  . Pulmonary embolism and infarction [facility]) March 2015  . Renal mass, left Oct 2014   with ureteral obstruction  . Stroke ([place])   . Thrombocytopenia ([place])   . Tobacco abuse     Patient Active Problem List   Diagnosis Date Noted  . DVT of lower limb, acute ([place]) 01/05/2018  . Prediabetes 01/05/2018  . Marijuana use 08/08/2016  . Need for diphtheria-tetanus-pertussis (Tdap) vaccine, adult/adolescent 08/07/2016  . Anxiety disorder 09/01/2015  . Obesity 09/01/2015  . Tobacco abuse 09/01/2015  . Fatty liver 09/01/2015  . Ganglion cyst of flexor tendon sheath of finger 08/28/2015  . Knee pain, bilateral 08/28/2015  . Pulmonary embolus ([place]) 08/28/2015  .  Erectile dysfunction 08/28/2015  . 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 Renal cell carcinoma ([place]) 02/22/2015  . COPD, mild ([place]) 06/06/2014    Past Surgical History:  Procedure Laterality Date  . ANKLE FRACTURE SURGERY Right 12/2012  . EXCISION METACARPAL MASS Right 09/18/2016   Procedure: EXCISION METACARPAL MASS;  Surgeon: Hessie Knows, MD;  Location: [facility] ORS;  Service: Orthopedics;  Laterality: Right;  5th digit   . HERNIA REPAIR     Umbilical Hernia  . ORBITAL FRACTURE SURGERY Left   . ROBOTIC ASSITED PARTIAL NEPHRECTOMY Left Oct 2014    Prior to Admission medications   Medication Sig Start Date End Date Taking? Authorizing Provider  XARELTO 20 MG TABS tablet TAKE ONE TABLET BY MOUTH ONCE DAILY WITH  SUPPER 02/23/18   Lada, Satira Anis, MD     Allergies Patient has no known allergies.  Family History  Problem Relation Age of Onset  . Cancer Mother        pancreatic  . Cancer Father        colon  . Diabetes Father   . Cancer Sister        colon  . COPD Sister   . Seizures Sister   . Cancer Paternal Grandmother        bone  . Stroke Paternal Grandfather   . Heart disease Paternal  Grandfather   . Cancer Sister        ovarian  . Hypertension Neg Hx     Social History Social History   Tobacco Use  . Smoking status: Current Every Day Smoker    Packs/day: 0.50    Types: Cigarettes  . Smokeless tobacco: Never Used  Substance Use Topics  . Alcohol use: Yes    Comment: occasionally  . Drug use: Yes    Types: Marijuana    Comment: occ    Review of Systems  Constitutional: No fever/chills Eyes: No visual changes.  ENT: No sore throat. Cardiovascular: Denies chest pain.  No pleurisy Respiratory: Denies shortness of breath. Gastrointestinal: No abdominal pain.  No nausea, no vomiting.   Genitourinary: Negative for dysuria. Musculoskeletal: As above Skin: Negative for rash. Neurological: Negative for headaches or  weakness   ____________________________________________   PHYSICAL EXAM:  VITAL SIGNS: ED Triage Vitals  Enc Vitals Group     BP 05/03/18 0946 122/71     Pulse Rate 05/03/18 1131 75     Resp 05/03/18 0946 16     Temp 05/03/18 0946 98.2 F (36.8 C)     Temp Source 05/03/18 0946 Oral     SpO2 05/03/18 0946 97 %     Weight 05/03/18 0946 83.9 kg (185 lb)     Height 05/03/18 0946 1.702 m (5\' 7" )     Head Circumference --      Peak Flow --      Pain Score 05/03/18 0955 5     Pain Loc --      Pain Edu? --      Excl. in [place]? --     Constitutional: Alert and oriented. No acute distress. Pleasant and interactive  Nose: No congestion/rhinnorhea. Mouth/Throat: Mucous membranes are moist.    Cardiovascular: Normal rate, regular rhythm. Grossly normal heart sounds.  Good peripheral circulation. Respiratory: Normal respiratory effort.  No retractions. Lungs CTAB. Gastrointestinal: Soft and nontender. No distention.  No  Genitourinary: deferred Musculoskeletal: No lower extremity tenderness nor edema.  Warm and well perfused, no calf tenderness to palpation Neurologic:  Normal speech and language. No gross focal neurologic deficits are appreciated.  Skin:  Skin is warm, dry and intact. No rash noted. Psychiatric: Mood and affect are normal. Speech and behavior are normal.  ____________________________________________   LABS (all labs ordered are listed, but only abnormal results are displayed)  Labs Reviewed  BASIC METABOLIC PANEL - Abnormal; Notable for the following components:      Result Value   Glucose, Bld 145 (*)    Calcium 8.8 (*)    All other components within normal limits  CBC  TROPONIN I   ____________________________________________  EKG  ED ECG REPORT I, Lavonia Drafts, the attending physician, personally viewed and interpreted this ECG.  Date: 05/03/2018  Rhythm: normal sinus rhythm QRS Axis: normal Intervals: normal ST/T Wave abnormalities:  normal Narrative Interpretation: no evidence of acute ischemia  ____________________________________________  RADIOLOGY  Ultrasound negative for DVT ____________________________________________   PROCEDURES  Procedure(s) performed: No  Procedures   Critical Care performed: No ____________________________________________   INITIAL IMPRESSION / ASSESSMENT AND PLAN / ED COURSE  Pertinent labs & imaging results that were available during my care of the patient were reviewed by me and considered in my medical decision making (see chart for details).  Patient well-appearing in no acute distress.  Lab work unremarkable.  Ultrasound negative for DVT, patient will go to his PCP today to refill Xarelto  ____________________________________________   FINAL CLINICAL IMPRESSION(S) / ED DIAGNOSES  Final diagnoses:  Bilateral leg pain        Note:  This document was prepared using Dragon voice recognition software and may include unintentional dictation errors.    Lavonia Drafts, MD 05/03/18 [phone]

## 2018-05-03 NOTE — ED Notes (Signed)
Per Dr. Corky Downs d/c pt without chest xray.

## 2018-05-03 NOTE — ED Notes (Signed)
Patient located in [place].

## 2018-05-03 NOTE — ED Notes (Signed)
First Nurse Note: Patient has pain in both calves.  Hx of blood clots.  Supposed to take Xarelto 20 mg. But has not taken in a couple of weeks.  No swelling or redness noted.

## 2018-05-03 NOTE — Telephone Encounter (Signed)
Copied from [place] [id number]. Topic: Quick Communication - Rx Refill/Question >> May 03, 2018 12:00 PM Judyann Munson wrote: Medication: XARELTO 20 MG TABS tablet  Has the patient contacted their pharmacy? No   Preferred Pharmacy (with phone number or street name): [facility] ([facility]), [place] [phone] (Phone) [phone] (Fax)   Patient is requesting a 90 day supply.

## 2018-05-03 NOTE — ED Notes (Signed)
Pt called in the [place] with no response, pt not visualized in [place], Flex wait, or subwait. Will attempt to call for pt one more time.

## 2018-05-03 NOTE — ED Notes (Signed)
First Nurse Note:  Radiology to ED WR to get patient, patient not in [place].

## 2018-05-03 NOTE — ED Triage Notes (Addendum)
Pt has hx of DVTs and PE, states he is supposed to be on Xarelto but has been having trouble getting a refill from his PCP and has missed about 2 weeks of medication.  Pt states on Saturday he started having left leg pain in calf throbbing that brought him to tears.  Pt states last night he developed burning and tingling into right calf which has worsened today.  Pt denies any SOB or chest pain at this time. States he did have shortness of breath last night.

## 2018-05-03 NOTE — ED Notes (Signed)
Spoke with Dr. Joni Fears re: pt chief complaint, new orders for [place] given.

## 2018-05-04 NOTE — Telephone Encounter (Signed)
Left voicemail no refill needed at this time

## 2018-05-04 NOTE — Telephone Encounter (Signed)
Called in.

## 2018-05-04 NOTE — Telephone Encounter (Signed)
Patient called and said that the pharmacy only gave him 30 pills when he picked it up on 5/30. He is currently out. He said there were some days that he forgot to take the medication. He said he needs enough so he can take it everyday.

## 2018-08-15 ENCOUNTER — Emergency Department
Admission: EM | Admit: 2018-08-15 | Discharge: 2018-08-16 | Disposition: A | Discharge status: Home / Self Care | Payer: Medicare HMO | Attending: Emergency Medicine | Admitting: Emergency Medicine

## 2018-08-15 ENCOUNTER — Emergency Department: Payer: Medicare HMO

## 2018-08-15 DIAGNOSIS — I2510 Atherosclerotic heart disease of native coronary artery without angina pectoris: Secondary | ICD-10-CM | POA: Insufficient documentation

## 2018-08-15 DIAGNOSIS — Z8673 Personal history of transient ischemic attack (TIA), and cerebral infarction without residual deficits: Secondary | ICD-10-CM | POA: Diagnosis not present

## 2018-08-15 DIAGNOSIS — M79605 Pain in left leg: Secondary | ICD-10-CM | POA: Insufficient documentation

## 2018-08-15 DIAGNOSIS — R69 Illness, unspecified: Secondary | ICD-10-CM | POA: Diagnosis not present

## 2018-08-15 DIAGNOSIS — Z86718 Personal history of other venous thrombosis and embolism: Secondary | ICD-10-CM | POA: Insufficient documentation

## 2018-08-15 DIAGNOSIS — J449 Chronic obstructive pulmonary disease, unspecified: Secondary | ICD-10-CM | POA: Insufficient documentation

## 2018-08-15 DIAGNOSIS — Z8553 Personal history of malignant neoplasm of renal pelvis: Secondary | ICD-10-CM | POA: Diagnosis not present

## 2018-08-15 DIAGNOSIS — I10 Essential (primary) hypertension: Secondary | ICD-10-CM | POA: Insufficient documentation

## 2018-08-15 DIAGNOSIS — M79662 Pain in left lower leg: Secondary | ICD-10-CM | POA: Diagnosis not present

## 2018-08-15 DIAGNOSIS — Z7901 Long term (current) use of anticoagulants: Secondary | ICD-10-CM | POA: Diagnosis not present

## 2018-08-15 DIAGNOSIS — F17210 Nicotine dependence, cigarettes, uncomplicated: Secondary | ICD-10-CM | POA: Diagnosis not present

## 2018-08-15 DIAGNOSIS — M25572 Pain in left ankle and joints of left foot: Secondary | ICD-10-CM | POA: Diagnosis not present

## 2018-08-15 NOTE — ED Notes (Signed)
Pt to US via wheelchair.

## 2018-08-15 NOTE — ED Triage Notes (Signed)
Patient c/o left leg pain, and intermittent left leg swelling. Patient reports hx DVTs and PE. Patient reports being evaluated in this ED 5 weeks ago for same. Patients reports ultrasound negative. Patient is on Xarelto.

## 2018-08-15 NOTE — ED Notes (Signed)
First Nurse Note: pt to waiting area via ems with reports of left lower leg pain. Pt has hx of kidney CA and DVT, takes xarelto at home. Hypertensive with ems 170/104, 102HR.

## 2018-08-16 ENCOUNTER — Emergency Department: Payer: Medicare HMO

## 2018-08-16 DIAGNOSIS — I2510 Atherosclerotic heart disease of native coronary artery without angina pectoris: Secondary | ICD-10-CM | POA: Diagnosis not present

## 2018-08-16 DIAGNOSIS — R69 Illness, unspecified: Secondary | ICD-10-CM | POA: Diagnosis not present

## 2018-08-16 DIAGNOSIS — Z86718 Personal history of other venous thrombosis and embolism: Secondary | ICD-10-CM | POA: Diagnosis not present

## 2018-08-16 DIAGNOSIS — Z8553 Personal history of malignant neoplasm of renal pelvis: Secondary | ICD-10-CM | POA: Diagnosis not present

## 2018-08-16 DIAGNOSIS — Z7901 Long term (current) use of anticoagulants: Secondary | ICD-10-CM | POA: Diagnosis not present

## 2018-08-16 DIAGNOSIS — Z8673 Personal history of transient ischemic attack (TIA), and cerebral infarction without residual deficits: Secondary | ICD-10-CM | POA: Diagnosis not present

## 2018-08-16 DIAGNOSIS — J449 Chronic obstructive pulmonary disease, unspecified: Secondary | ICD-10-CM | POA: Diagnosis not present

## 2018-08-16 DIAGNOSIS — M79605 Pain in left leg: Secondary | ICD-10-CM | POA: Diagnosis not present

## 2018-08-16 DIAGNOSIS — I10 Essential (primary) hypertension: Secondary | ICD-10-CM | POA: Diagnosis not present

## 2018-08-16 MED ORDER — HYDROCODONE-ACETAMINOPHEN 5-325 MG PO TABS: 1.0000 | ORAL_TABLET | ORAL | 0 refills | Status: DC | PRN

## 2018-08-16 MED ORDER — DOCUSATE SODIUM 100 MG PO CAPS: ORAL_CAPSULE | ORAL | 0 refills | Status: DC

## 2018-08-16 MED ORDER — OXYCODONE-ACETAMINOPHEN 5-325 MG PO TABS
2.0000 | ORAL_TABLET | Freq: Once | ORAL | Status: AC
  Administered 2018-08-16: 2 via ORAL
  Filled 2018-08-16: qty 2

## 2018-08-16 NOTE — ED Provider Notes (Signed)
[facility] Emergency Department Provider Note  ____________________________________________   First MD Initiated Contact with Patient 08/16/18 0023     (approximate)  I have reviewed the triage vital signs and the nursing notes.   HISTORY  Chief Complaint Leg Pain    HPI Randall Harvey. is a 57 y.o. male with extensive medical history as listed below who presents for evaluation of waxing and waning but persistent pain at the bottom of his left leg on the outside, just above his ankle.  He states that sometimes the pain is much worse than others and sometimes it swells up "like another ankle".  When it does so it radiates pain up the back of his leg.  Currently he says it is much better but it is still hurting "like a toothache".  He says it is been going on for 2 months but it is been worse over the last week.  He has a history of DVT but he takes Xarelto and reports that he is compliant.  He has a orthopedic doctor but has not discussed the pain in his leg with the orthopedist.  He sustained no trauma of which she is aware.  Nothing in particular makes it better or worse.  Past Medical History:  Diagnosis Date  . Anticoagulant long-term use    lifetime use, coumadin therapy, then Xarelto  . Anxiety   . Arthritis   . Cancer ([place])    renal  . Carotid atherosclerosis    <50% left and right  . Clotting disorder ([place])   . COPD (chronic obstructive pulmonary disease) ([place])   . Depression   . Elevated liver enzymes March 2015  . Fatty liver   . H/O drug abuse ([place])   . H/O ETOH abuse   . Hepatitis   . Hypertension   . Obesity   . Personal history of DVT (deep vein thrombosis) Oct 2014  . Pre-diabetes   . Pulmonary embolism and infarction [facility]) Oct 2014  . Pulmonary embolism and infarction [facility]) March 2015  . Renal mass, left Oct 2014   with ureteral obstruction  . Stroke ([place])   . Thrombocytopenia ([place])   . Tobacco abuse     Patient Active  Problem List   Diagnosis Date Noted  . DVT of lower limb, acute ([place]) 01/05/2018  . Prediabetes 01/05/2018  . Marijuana use 08/08/2016  . Need for diphtheria-tetanus-pertussis (Tdap) vaccine, adult/adolescent 08/07/2016  . Anxiety disorder 09/01/2015  . Obesity 09/01/2015  . Tobacco abuse 09/01/2015  . Fatty liver 09/01/2015  . Ganglion cyst of flexor tendon sheath of finger 08/28/2015  . Knee pain, bilateral 08/28/2015  . Pulmonary embolus ([place]) 08/28/2015  . Erectile dysfunction 08/28/2015  . 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 Renal cell carcinoma ([place]) 02/22/2015  . COPD, mild ([place]) 06/06/2014    Past Surgical History:  Procedure Laterality Date  . ANKLE FRACTURE SURGERY Right 12/2012  . EXCISION METACARPAL MASS Right 09/18/2016   Procedure: EXCISION METACARPAL MASS;  Surgeon: Hessie Knows, MD;  Location: [facility] ORS;  Service: Orthopedics;  Laterality: Right;  5th digit   . HERNIA REPAIR     Umbilical Hernia  . ORBITAL FRACTURE SURGERY Left   . ROBOTIC ASSITED PARTIAL NEPHRECTOMY Left Oct 2014    Prior to Admission medications   Medication Sig Start Date End Date Taking? Authorizing Provider  docusate sodium (COLACE) 100 MG capsule Take 1 tablet once or twice daily as needed for constipation while taking narcotic  pain medicine 08/16/18   Hinda Kehr, MD  HYDROcodone-acetaminophen (NORCO/VICODIN) 5-325 MG tablet Take 1 tablet by mouth every 4 (four) hours as needed for moderate pain or severe pain. 08/16/18   Hinda Kehr, MD  XARELTO 20 MG TABS tablet TAKE ONE TABLET BY MOUTH ONCE DAILY WITH  SUPPER 02/23/18   Lada, Satira Anis, MD    Allergies Patient has no known allergies.  Family History  Problem Relation Age of Onset  . Cancer Mother        pancreatic  . Cancer Father        colon  . Diabetes Father   . Cancer Sister        colon  . COPD Sister   . Seizures Sister   . Cancer Paternal Grandmother        bone  . Stroke Paternal Grandfather   .  Heart disease Paternal Grandfather   . Cancer Sister        ovarian  . Hypertension Neg Hx     Social History Social History   Tobacco Use  . Smoking status: Current Every Day Smoker    Packs/day: 0.50    Types: Cigarettes  . Smokeless tobacco: Never Used  Substance Use Topics  . Alcohol use: Yes    Comment: occasionally  . Drug use: Yes    Types: Marijuana    Comment: occ    Review of Systems Constitutional: No fever/chills Cardiovascular: Denies chest pain. Respiratory: Denies shortness of breath. Gastrointestinal: No abdominal pain.  No nausea, no vomiting.  No diarrhea.  No constipation. Genitourinary: Negative for dysuria. Musculoskeletal: Pain in left lateral lower leg as described above.  Negative for neck pain.  Negative for back pain. Integumentary: Negative for rash. Neurological: Negative for headaches, focal weakness or numbness.   ____________________________________________   PHYSICAL EXAM:  VITAL SIGNS: ED Triage Vitals  Enc Vitals Group     BP 08/15/18 1926 (!) 152/81     Pulse Rate 08/15/18 1926 84     Resp 08/15/18 1926 17     Temp 08/15/18 1926 98.1 F (36.7 C)     Temp Source 08/15/18 1926 Oral     SpO2 08/15/18 1926 98 %     Weight 08/15/18 1927 83.9 kg (184 lb 15.5 oz)     Height --      Head Circumference --      Peak Flow --      Pain Score 08/15/18 1927 6     Pain Loc --      Pain Edu? --      Excl. in [place]? --     Constitutional: Alert and oriented. Well appearing and in no acute distress. Eyes: Conjunctivae are normal.  Head: Atraumatic. Cardiovascular: Normal rate, regular rhythm. Good peripheral circulation.  Respiratory: Normal respiratory effort.  No retractions.  Musculoskeletal: The patient has a tender area just proximal to the left lateral malleolus but I do not appreciate any fluctuance, induration, hematoma, erythema, or gross bone deformity.  He has normal range of motion and is ambulatory.  No lower extremity  tenderness nor edema. No gross deformities of extremities. Neurologic:  Normal speech and language. No gross focal neurologic deficits are appreciated.  Skin:  Skin is warm, dry and intact. No rash noted. Psychiatric: Mood and affect are normal. Speech and behavior are normal.  ____________________________________________   LABS (all labs ordered are listed, but only abnormal results are displayed)  Labs Reviewed - No data to display ____________________________________________  EKG  No indication for EKG ____________________________________________  RADIOLOGY   ED MD interpretation: No DVT on ultrasound, no acute changes or fracture/dislocations identified on x-rays.  No bone lesions.  Official radiology report(s): Dg Ankle 2 Views Left  Result Date: 08/16/2018 CLINICAL DATA:  Left leg pain. EXAM: LEFT ANKLE - 2 VIEW COMPARISON:  None. FINDINGS: Mild degenerative changes in the ankle. No fractures or dislocations. No obvious joint effusion. IMPRESSION: Degenerative changes.  No fracture. Electronically Signed   By: Dorise Bullion III M.D   On: 08/16/2018 01:15   US Venous Img Lower Unilateral Left  Result Date: 08/15/2018 CLINICAL DATA:  Left lower extremity pain. EXAM: LEFT LOWER EXTREMITY VENOUS DOPPLER ULTRASOUND TECHNIQUE: Gray-scale sonography with graded compression, as well as color Doppler and duplex ultrasound were performed to evaluate the lower extremity deep venous systems from the level of the common femoral vein and including the common femoral, femoral, profunda femoral, popliteal and calf veins including the posterior tibial, peroneal and gastrocnemius veins when visible. The superficial great saphenous vein was also interrogated. Spectral Doppler was utilized to evaluate flow at rest and with distal augmentation maneuvers in the common femoral, femoral and popliteal veins. COMPARISON:  05/03/2018 FINDINGS: Contralateral Common Femoral Vein: Respiratory phasicity is  normal and symmetric with the symptomatic side. No evidence of thrombus. Normal compressibility. Common Femoral Vein: No evidence of thrombus. Normal compressibility, respiratory phasicity and response to augmentation. Saphenofemoral Junction: No evidence of thrombus. Normal compressibility and flow on color Doppler imaging. Profunda Femoral Vein: No evidence of thrombus. Normal compressibility and flow on color Doppler imaging. Femoral Vein: No evidence of thrombus. Normal compressibility, respiratory phasicity and response to augmentation. Popliteal Vein: No evidence of thrombus. Normal compressibility, respiratory phasicity and response to augmentation. Calf Veins: No evidence of thrombus. Normal compressibility and flow on color Doppler imaging. Superficial Great Saphenous Vein: No evidence of thrombus. Normal compressibility. Venous Reflux:  None. Other Findings:  None. IMPRESSION: No evidence of deep venous thrombosis. Electronically Signed   By: Rolm Baptise M.D.   On: 08/15/2018 21:05    ____________________________________________   PROCEDURES  Critical Care performed: No   Procedure(s) performed:   Procedures   ____________________________________________   INITIAL IMPRESSION / ASSESSMENT AND PLAN / ED COURSE  As part of my medical decision making, I reviewed the following data within the [place] notes reviewed and incorporated, Old chart reviewed, Radiograph reviewed , Notes from prior ED visits and  Controlled Substance Database  Differential diagnosis includes, but is not limited to, gout, peripheral vascular disease, DVT, traumatic hematoma, bone lesion, fracture or dislocation.  Explained to the patient it is unlikely I will be able to identify what is been going on for 2 months and will go away and then come back.  His DVT ultrasound was negative and there is no evidence of trauma, infection, nor inflammation.  It would be a very unusual area for  gout and he has no physical findings consistent with a gout flare at this time.  He became quite agitated when I told him that he would need to follow-up as an outpatient and he said that he has multiple family members that have had cancer including someone with "bone cancer" and that he has a friend who just died from bone cancer who reported similar symptoms.  I obtained radiographs and there is no indication of any bony lesions, fractures, nor dislocations of the distal fibula or tibia.  I provided reassurance and check the  [place] controlled substance database.  He has no recent prescriptions, and only one prescription for Norco 2 years ago.  I will give him a few Norco to help with what he describes as acute pain but I told him he needs to follow-up with orthopedics and that the emergency department has nothing else to offer for this issue at this time.  He understands and agrees with the plan.     ____________________________________________  FINAL CLINICAL IMPRESSION(S) / ED DIAGNOSES  Final diagnoses:  Left leg pain     MEDICATIONS GIVEN DURING THIS VISIT:  Medications  oxyCODONE-acetaminophen (PERCOCET/ROXICET) 5-325 MG per tablet 2 tablet (2 tablets Oral Given 08/16/18 0040)     ED Discharge Orders         Ordered    HYDROcodone-acetaminophen (NORCO/VICODIN) 5-325 MG tablet  Every 4 hours PRN     08/16/18 0150    docusate sodium (COLACE) 100 MG capsule     08/16/18 0150           Note:  This document was prepared using Dragon voice recognition software and may include unintentional dictation errors.    Hinda Kehr, MD 08/16/18 [phone]

## 2018-08-16 NOTE — Discharge Instructions (Signed)
Your workup in the Emergency Department today was reassuring.  We did not find any specific abnormalities.  Your ultrasound was negative for DVT, and your x-rays looked normal with no fractures, dislocations, nor lesions on your bone(s).  Follow up with the doctor(s) listed in these documents as recommended.  Take Norco as prescribed for severe pain. Do not drink alcohol, drive or participate in any other potentially dangerous activities while taking this medication as it may make you sleepy. Do not take this medication with any other sedating medications, either prescription or over-the-counter. If you were prescribed Percocet or Vicodin, do not take these with acetaminophen (Tylenol) as it is already contained within these medications.   This medication is an opiate (or narcotic) pain medication and can be habit forming.  Use it as little as possible to achieve adequate pain control.  Do not use or use it with extreme caution if you have a history of opiate abuse or dependence.  If you are on a pain contract with your primary care doctor or a pain specialist, be sure to let them know you were prescribed this medication today from the [facility] Emergency Department.  This medication is intended for your use only - do not give any to anyone else and keep it in a secure place where nobody else, especially children, have access to it.  It will also cause or worsen constipation, so you may want to consider taking an over-the-counter stool softener while you are taking this medication.  Return to the Emergency Department if you develop new or worsening symptoms that concern you.

## 2018-08-20 ENCOUNTER — Other Ambulatory Visit: Payer: Self-pay

## 2018-08-20 DIAGNOSIS — R69 Illness, unspecified: Secondary | ICD-10-CM | POA: Diagnosis not present

## 2018-08-20 MED ORDER — RIVAROXABAN 20 MG PO TABS: ORAL_TABLET | ORAL | 0 refills | Status: DC

## 2018-08-20 NOTE — Telephone Encounter (Signed)
Please ask patient to schedule a visit with me soon I'll refill his Xarelto and we look forward to seeing him in the next few weeks If he is due for a physical, please schedule that too

## 2018-08-23 ENCOUNTER — Ambulatory Visit (INDEPENDENT_AMBULATORY_CARE_PROVIDER_SITE_OTHER): Payer: Medicare HMO | Admitting: Nurse Practitioner

## 2018-08-23 ENCOUNTER — Encounter: Payer: Self-pay | Admitting: Nurse Practitioner

## 2018-08-23 VITALS — BP 128/76 | HR 100 | Temp 99.1°F | Resp 16 | Ht 67.0 in | Wt 189.4 lb

## 2018-08-23 DIAGNOSIS — Z1211 Encounter for screening for malignant neoplasm of colon: Secondary | ICD-10-CM

## 2018-08-23 DIAGNOSIS — M25572 Pain in left ankle and joints of left foot: Secondary | ICD-10-CM | POA: Diagnosis not present

## 2018-08-23 MED ORDER — LIDOCAINE HCL 4 % EX CREA: 1.0000 "application " | TOPICAL_CREAM | Freq: Every day | CUTANEOUS | 0 refills | Status: DC

## 2018-08-23 MED ORDER — LIDOCAINE 5 % EX CREA: 1.0000 "application " | TOPICAL_CREAM | Freq: Every day | CUTANEOUS | 0 refills | Status: DC

## 2018-08-23 NOTE — Patient Instructions (Addendum)
-   Urgent referral or ortho - Can lidocaine cream and acetaminophen (no more than 3000 mg daily)  Ankle Pain Many things can cause ankle pain, including an injury to the area and overuse of the ankle.The ankle joint holds your body weight and allows you to move around. Ankle pain can occur on either side or the back of one ankle or both ankles. Ankle pain may be sharp and burning or dull and aching. There may be tenderness, stiffness, redness, or warmth around the ankle. Follow these instructions at home: Activity  Rest your ankle as told by your health care provider. Avoid any activities that cause ankle pain.  Do exercises as told by your health care provider.  Ask your health care provider if you can drive. Using a brace, a bandage, or crutches  If you were given a brace: ? Wear it as told by your health care provider. ? Remove it when you take a bath or a shower. ? Try not to move your ankle very much, but wiggle your toes from time to time. This helps to prevent swelling.  If you were given an elastic bandage: ? Remove it when you take a bath or a shower. ? Try not to move your ankle very much, but wiggle your toes from time to time. This helps to prevent swelling. ? Adjust the bandage to make it more comfortable if it feels too tight. ? Loosen the bandage if you have numbness or tingling in your foot or if your foot turns cold and blue.  If you have crutches, use them as told by your health care provider. Continue to use them until you can walk without feeling pain in your ankle. Managing pain, stiffness, and swelling  Raise (elevate) your ankle above the level of your heart while you are sitting or lying down.  If directed, apply ice to the area: ? Put ice in a plastic bag. ? Place a towel between your skin and the bag. ? Leave the ice on for 20 minutes, 2-3 times per day. General instructions  Keep all follow-up visits as told by your health care provider. This is  important.  Record this information that may be helpful for you and your health care provider: ? How often you have ankle pain. ? Where the pain is located. ? What the pain feels like.  Take over-the-counter and prescription medicines only as told by your health care provider. Contact a health care provider if:  Your pain gets worse.  Your pain is not relieved with medicines.  You have a fever or chills.  You are having more trouble with walking.  You have new symptoms. Get help right away if:  Your foot, leg, toes, or ankle tingles or becomes numb.  Your foot, leg, toes, or ankle becomes swollen.  Your foot, leg, toes, or ankle turns pale or blue. This information is not intended to replace advice given to you by your health care provider. Make sure you discuss any questions you have with your health care provider. Document Released: 03/05/2010 Document Revised: 05/16/2016 Document Reviewed: 04/17/2015 Elsevier Interactive Patient Education  [employer].

## 2018-08-23 NOTE — Progress Notes (Signed)
uName: Randall Harvey.   MRN: [id number]    DOB: 03-Sep-1961   Date:08/23/2018       Progress Note  Subjective  Chief Complaint  Chief Complaint  Patient presents with  . Leg Pain    left leg seen in ER x 2    HPI  Patient endorses mild pain on outer aspect of ankle; states it puffed up and it felt like an achy, throbbing intermittent pain states could feel his pulse through it states pain has become more constant and the pain has spread around ankle and up his calf up to his knee. States sometimes has numbness behind calf.  No back pain, no known injuries. Pain is worse when he initially sits down and stands up. States he saw a vessel that was raised up.   Patient was evaluated on 11/18 for left leg pain; ankle x-ray showed mild degenerative changes. Left leg US showed no evidence of thrombus or venous reflux. He was given a short course of percocet and instructed to follow-up with is orthopedic doctor.   Patient Active Problem List   Diagnosis Date Noted  . DVT of lower limb, acute ([place]) 01/05/2018  . Prediabetes 01/05/2018  . Marijuana use 08/08/2016  . Need for diphtheria-tetanus-pertussis (Tdap) vaccine, adult/adolescent 08/07/2016  . Anxiety disorder 09/01/2015  . Obesity 09/01/2015  . Tobacco abuse 09/01/2015  . Fatty liver 09/01/2015  . Ganglion cyst of flexor tendon sheath of finger 08/28/2015  . Knee pain, bilateral 08/28/2015  . Pulmonary embolus ([place]) 08/28/2015  . Erectile dysfunction 08/28/2015  . 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 Renal cell carcinoma ([place]) 02/22/2015  . COPD, mild ([place]) 06/06/2014    Past Medical History:  Diagnosis Date  . Anticoagulant long-term use    lifetime use, coumadin therapy, then Xarelto  . Anxiety   . Arthritis   . Cancer ([place])    renal  . Carotid atherosclerosis    <50% left and right  . Clotting disorder ([place])   . COPD (chronic obstructive pulmonary disease) ([place])   . Depression   . Elevated liver enzymes  March 2015  . Fatty liver   . H/O drug abuse ([place])   . H/O ETOH abuse   . Hepatitis   . Hypertension   . Obesity   . Personal history of DVT (deep vein thrombosis) Oct 2014  . Pre-diabetes   . Pulmonary embolism and infarction [facility]) Oct 2014  . Pulmonary embolism and infarction [facility]) March 2015  . Renal mass, left Oct 2014   with ureteral obstruction  . Stroke ([place])   . Thrombocytopenia ([place])   . Tobacco abuse     Past Surgical History:  Procedure Laterality Date  . ANKLE FRACTURE SURGERY Right 12/2012  . EXCISION METACARPAL MASS Right 09/18/2016   Procedure: EXCISION METACARPAL MASS;  Surgeon: Hessie Knows, MD;  Location: [facility] ORS;  Service: Orthopedics;  Laterality: Right;  5th digit   . HERNIA REPAIR     Umbilical Hernia  . ORBITAL FRACTURE SURGERY Left   . ROBOTIC ASSITED PARTIAL NEPHRECTOMY Left Oct 2014    Social History   Tobacco Use  . Smoking status: Current Every Day Smoker    Packs/day: 0.50    Types: Cigarettes  . Smokeless tobacco: Never Used  Substance Use Topics  . Alcohol use: Yes    Comment: occasionally     Current Outpatient Medications:  .  docusate sodium (COLACE) 100 MG capsule, Take 1 tablet once or twice daily  as needed for constipation while taking narcotic pain medicine, Disp: 30 capsule, Rfl: 0 .  HYDROcodone-acetaminophen (NORCO/VICODIN) 5-325 MG tablet, Take 1 tablet by mouth every 4 (four) hours as needed for moderate pain or severe pain., Disp: 10 tablet, Rfl: 0 .  rivaroxaban (XARELTO) 20 MG TABS tablet, TAKE ONE TABLET BY MOUTH ONCE DAILY WITH  SUPPER, Disp: 90 tablet, Rfl: 0  No Known Allergies  ROS   No other specific complaints in a complete review of systems (except as listed in HPI above).  Objective  Vitals:   08/23/18 1402  BP: 128/76  Pulse: 100  Resp: 16  Temp: 99.1 F (37.3 C)  TempSrc: Oral  SpO2: 99%  Weight: 189 lb 6.4 oz (85.9 kg)  Height: 5\' 7"  (1.702 m)    Body mass index is 29.66 kg/m.  Nursing  Note and Vital Signs reviewed.  Physical Exam  Constitutional: He is oriented to person, place, and time. He appears well-developed and well-nourished.  HENT:  Head: Normocephalic and atraumatic.  Eyes: Conjunctivae are normal.  Cardiovascular: Normal rate.  Pulmonary/Chest: Effort normal.  Musculoskeletal: He exhibits no edema or deformity.       Left knee: He exhibits decreased range of motion (patient has ace bandage on knee due to chronic knee pain).       Left ankle: He exhibits normal range of motion, no swelling and no deformity. Tenderness. Achilles tendon exhibits no pain.  Neurological: He is alert and oriented to person, place, and time.  Skin: Skin is warm and dry. No rash noted.  Psychiatric: He has a normal mood and affect. His behavior is normal. Judgment and thought content normal.     No results found for this or any previous visit (from the past 48 hour(s)).  Assessment & Plan  1. Acute left ankle pain Informed of office controlled substance policy, urgent referral to orthopedic- due to patient endorsing severe pain intermittently. Pain medication limited due to xarelto.  - Ambulatory referral to Orthopedic Surgery - Lidocaine 5 % CREA; Apply 1 application topically daily.  Dispense: 1 Tube; Refill: 0  2. Screening for colon cancer - Ambulatory referral to Gastroenterology

## 2018-08-23 NOTE — Telephone Encounter (Signed)
Pt notified, appt scheduled. Will schedule cpe at appt

## 2018-08-30 DIAGNOSIS — M1712 Unilateral primary osteoarthritis, left knee: Secondary | ICD-10-CM | POA: Diagnosis not present

## 2018-09-08 ENCOUNTER — Ambulatory Visit: Payer: Self-pay | Admitting: Family Medicine

## 2018-09-13 ENCOUNTER — Encounter: Payer: Self-pay | Admitting: *Deleted

## 2018-09-27 ENCOUNTER — Encounter: Payer: Self-pay | Admitting: Family Medicine

## 2018-09-27 ENCOUNTER — Ambulatory Visit (INDEPENDENT_AMBULATORY_CARE_PROVIDER_SITE_OTHER): Payer: Medicare HMO | Admitting: Family Medicine

## 2018-09-27 VITALS — BP 128/76 | HR 99 | Temp 98.9°F | Ht 65.0 in | Wt 188.4 lb

## 2018-09-27 DIAGNOSIS — I2782 Chronic pulmonary embolism: Secondary | ICD-10-CM | POA: Diagnosis not present

## 2018-09-27 DIAGNOSIS — R7303 Prediabetes: Secondary | ICD-10-CM

## 2018-09-27 DIAGNOSIS — K760 Fatty (change of) liver, not elsewhere classified: Secondary | ICD-10-CM

## 2018-09-27 DIAGNOSIS — Z1211 Encounter for screening for malignant neoplasm of colon: Secondary | ICD-10-CM | POA: Diagnosis not present

## 2018-09-27 DIAGNOSIS — E6609 Other obesity due to excess calories: Secondary | ICD-10-CM

## 2018-09-27 DIAGNOSIS — E781 Pure hyperglyceridemia: Secondary | ICD-10-CM | POA: Diagnosis not present

## 2018-09-27 DIAGNOSIS — R0989 Other specified symptoms and signs involving the circulatory and respiratory systems: Secondary | ICD-10-CM | POA: Diagnosis not present

## 2018-09-27 DIAGNOSIS — Z720 Tobacco use: Secondary | ICD-10-CM

## 2018-09-27 DIAGNOSIS — Z6831 Body mass index (BMI) 31.0-31.9, adult: Secondary | ICD-10-CM

## 2018-09-27 DIAGNOSIS — Z5181 Encounter for therapeutic drug level monitoring: Secondary | ICD-10-CM | POA: Diagnosis not present

## 2018-09-27 MED ORDER — RIVAROXABAN 20 MG PO TABS: ORAL_TABLET | ORAL | 2 refills | Status: DC

## 2018-09-27 NOTE — Patient Instructions (Addendum)
Check out the information at familydoctor.org entitled "Nutrition for Weight Loss: What You Need to Know about Fad Diets" Try to lose between 1-2 pounds per week by taking in fewer calories and burning off more calories You can succeed by limiting portions, limiting foods dense in calories and fat, becoming more active, and drinking 8 glasses of water a day (64 ounces) Don't skip meals, especially breakfast, as skipping meals may alter your metabolism Do not use over-the-counter weight loss pills or gimmicks that claim rapid weight loss A healthy BMI (or body mass index) is between 18.5 and 24.9 You can calculate your ideal BMI at the NIH website ClubMonetize.fr Try to limit saturated fats in your diet (bologna, hot dogs, barbeque, cheeseburgers, hamburgers, steak, bacon, sausage, cheese, etc.) and get more fresh fruits, vegetables, and whole grains We'll get some labs If you have not heard anything from my staff in a week about any orders/referrals/studies from today, please contact us here to follow-up [phone]  Obesity, Adult Obesity is the condition of having too much total body fat. Being overweight or obese means that your weight is greater than what is considered healthy for your body size. Obesity is determined by a measurement called BMI. BMI is an estimate of body fat and is calculated from height and weight. For adults, a BMI of 30 or higher is considered obese. Obesity can eventually lead to other health concerns and major illnesses, including:  Stroke.  Coronary artery disease (CAD).  Type 2 diabetes.  Some types of cancer, including cancers of the colon, breast, uterus, and gallbladder.  Osteoarthritis.  High blood pressure (hypertension).  High cholesterol.  Sleep apnea.  Gallbladder stones.  Infertility problems. What are the causes? The main cause of obesity is taking in (consuming) more calories than your  body uses for energy. Other factors that contribute to this condition may include:  Being born with genes that make you more likely to become obese.  Having a medical condition that causes obesity. These conditions include: ? Hypothyroidism. ? Polycystic ovarian syndrome (PCOS). ? Binge-eating disorder. ? Cushing syndrome.  Taking certain medicines, such as steroids, antidepressants, and seizure medicines.  Not being physically active (sedentary lifestyle).  Living where there are limited places to exercise safely or buy healthy foods.  Not getting enough sleep. What increases the risk? The following factors may increase your risk of this condition:  Having a family history of obesity.  Being a woman of African-American descent.  Being a man of Hispanic descent. What are the signs or symptoms? Having excessive body fat is the main symptom of this condition. How is this diagnosed? This condition may be diagnosed based on:  Your symptoms.  Your medical history.  A physical exam. Your health care provider may measure: ? Your BMI. If you are an adult with a BMI between 25 and less than 30, you are considered overweight. If you are an adult with a BMI of 30 or higher, you are considered obese. ? The distances around your hips and your waist (circumferences). These may be compared to each other to help diagnose your condition. ? Your skinfold thickness. Your health care provider may gently pinch a fold of your skin and measure it. How is this treated? Treatment for this condition often includes changing your lifestyle. Treatment may include some or all of the following:  Dietary changes. Work with your health care provider and a dietitian to set a weight-loss goal that is healthy and reasonable for you.  Dietary changes may include eating: ? Smaller portions. A portion size is the amount of a particular food that is healthy for you to eat at one time. This varies from person to  person. ? Low-calorie or low-fat options. ? More whole grains, fruits, and vegetables.  Regular physical activity. This may include aerobic activity (cardio) and strength training.  Medicine to help you lose weight. Your health care provider may prescribe medicine if you are unable to lose 1 pound a week after 6 weeks of eating more healthily and doing more physical activity.  Surgery. Surgical options may include gastric banding and gastric bypass. Surgery may be done if: ? Other treatments have not helped to improve your condition. ? You have a BMI of 40 or higher. ? You have life-threatening health problems related to obesity. Follow these instructions at home:  Eating and drinking   Follow recommendations from your health care provider about what you eat and drink. Your health care provider may advise you to: ? Limit fast foods, sweets, and processed snack foods. ? Choose low-fat options, such as low-fat milk instead of whole milk. ? Eat 5 or more servings of fruits or vegetables every day. ? Eat at home more often. This gives you more control over what you eat. ? Choose healthy foods when you eat out. ? Learn what a healthy portion size is. ? Keep low-fat snacks on hand. ? Avoid sugary drinks, such as soda, fruit juice, iced tea sweetened with sugar, and flavored milk. ? Eat a healthy breakfast.  Drink enough water to keep your urine clear or pale yellow.  Do not go without eating for long periods of time (do not fast) or follow a fad diet. Fasting and fad diets can be unhealthy and even dangerous. Physical Activity  Exercise regularly, as told by your health care provider. Ask your health care provider what types of exercise are safe for you and how often you should exercise.  Warm up and stretch before being active.  Cool down and stretch after being active.  Rest between periods of activity. Lifestyle  Limit the time that you spend in front of your TV, computer, or  video game system.  Find ways to reward yourself that do not involve food.  Limit alcohol intake to no more than 1 drink a day for nonpregnant women and 2 drinks a day for men. One drink equals 12 oz of beer, 5 oz of wine, or 1 oz of hard liquor. General instructions  Keep a weight loss journal to keep track of the food you eat and how much you exercise you get.  Take over-the-counter and prescription medicines only as told by your health care provider.  Take vitamins and supplements only as told by your health care provider.  Consider joining a support group. Your health care provider may be able to recommend a support group.  Keep all follow-up visits as told by your health care provider. This is important. Contact a health care provider if:  You are unable to meet your weight loss goal after 6 weeks of dietary and lifestyle changes. This information is not intended to replace advice given to you by your health care provider. Make sure you discuss any questions you have with your health care provider. Document Released: 10/23/2004 Document Revised: 02/18/2016 Document Reviewed: 07/04/2015 Elsevier Interactive Patient Education  2019 Elsevier Inc.  Preventing Unhealthy [employer], Adult Staying at a healthy weight is important to your overall health. When fat builds  up in your body, you may become overweight or obese. Being overweight or obese increases your risk of developing certain health problems, such as heart disease, diabetes, sleeping problems, joint problems, and some types of cancer. Unhealthy weight gain is often the result of making unhealthy food choices or not getting enough exercise. You can make changes to your lifestyle to prevent obesity and stay as healthy as possible. What nutrition changes can be made?   Eat only as much as your body needs. To do this: ? Pay attention to signs that you are hungry or full. Stop eating as soon as you feel full. ? If you feel  hungry, try drinking water first before eating. Drink enough water so your urine is clear or pale yellow. ? Eat smaller portions. Pay attention to portion sizes when eating out. ? Look at serving sizes on food labels. Most foods contain more than one serving per container. ? Eat the recommended number of calories for your gender and activity level. For most active people, a daily total of 2,000 calories is appropriate. If you are trying to lose weight or are not very active, you may need to eat fewer calories. Talk with your health care provider or a diet and nutrition specialist (dietitian) about how many calories you need each day.  Choose healthy foods, such as: ? Fruits and vegetables. At each meal, try to fill at least half of your plate with fruits and vegetables. ? Whole grains, such as whole-wheat bread, brown rice, and quinoa. ? Lean meats, such as chicken or fish. ? Other healthy proteins, such as beans, eggs, or tofu. ? Healthy fats, such as nuts, seeds, fatty fish, and olive oil. ? Low-fat or fat-free dairy products.  Check food labels, and avoid food and drinks that: ? Are high in calories. ? Have added sugar. ? Are high in sodium. ? Have saturated fats or trans fats.  Cook foods in healthier ways, such as by baking, broiling, or grilling.  Make a meal plan for the week, and shop with a grocery list to help you stay on track with your purchases. Try to avoid going to the grocery store when you are hungry.  When grocery shopping, try to shop around the outside of the store first, where the fresh foods are. Doing this helps you to avoid prepackaged foods, which can be high in sugar, salt (sodium), and fat. What lifestyle changes can be made?   Exercise for 30 or more minutes on 5 or more days each week. Exercising may include brisk walking, yard work, biking, running, swimming, and team sports like basketball and soccer. Ask your health care provider which exercises are safe for  you.  Do muscle-strengthening activities, such as lifting weights or using resistance bands, on 2 or more days a week.  Do not use any products that contain nicotine or tobacco, such as cigarettes and e-cigarettes. If you need help quitting, ask your health care provider.  Limit alcohol intake to no more than 1 drink a day for nonpregnant women and 2 drinks a day for men. One drink equals 12 oz of beer, 5 oz of wine, or 1 oz of hard liquor.  Try to get 7-9 hours of sleep each night. What other changes can be made?  Keep a food and activity journal to keep track of: ? What you ate and how many calories you had. Remember to count the calories in sauces, dressings, and side dishes. ? Whether you were active,  and what exercises you did. ? Your calorie, weight, and activity goals.  Check your weight regularly. Track any changes. If you notice you have gained weight, make changes to your diet or activity routine.  Avoid taking weight-loss medicines or supplements. Talk to your health care provider before starting any new medicine or supplement.  Talk to your health care provider before trying any new diet or exercise plan. Why are these changes important? Eating healthy, staying active, and having healthy habits can help you to prevent obesity. Those changes also:  Help you manage stress and emotions.  Help you connect with friends and family.  Improve your self-esteem.  Improve your sleep.  Prevent long-term health problems. What can happen if changes are not made? Being obese or overweight can cause you to develop joint or bone problems, which can make it hard for you to stay active or do activities you enjoy. Being obese or overweight also puts stress on your heart and lungs and can lead to health problems like diabetes, heart disease, and some cancers. Where to find more information Talk with your health care provider or a dietitian about healthy eating and healthy lifestyle  choices. You may also find information from:  U.S. Department of Agriculture, MyPlate: FormerBoss.no  American Heart Association: www.heart.org  Centers for Disease Control and Prevention: http://www.wolf.info/ Summary  Staying at a healthy weight is important to your overall health. It helps you to prevent certain diseases and health problems, such as heart disease, diabetes, joint problems, sleep disorders, and some types of cancer.  Being obese or overweight can cause you to develop joint or bone problems, which can make it hard for you to stay active or do activities you enjoy.  You can prevent unhealthy weight gain by eating a healthy diet, exercising regularly, not smoking, limiting alcohol, and getting enough sleep.  Talk with your health care provider or a dietitian for guidance about healthy eating and healthy lifestyle choices. This information is not intended to replace advice given to you by your health care provider. Make sure you discuss any questions you have with your health care provider. Document Released: 09/16/2016 Document Revised: 06/26/2017 Document Reviewed: 10/22/2016 Elsevier Interactive Patient Education  2019 [employer].

## 2018-09-27 NOTE — Assessment & Plan Note (Signed)
Check labs today.

## 2018-09-27 NOTE — Assessment & Plan Note (Signed)
Continue blood thinner; check CBC

## 2018-09-27 NOTE — Assessment & Plan Note (Signed)
Check liver enzymes 

## 2018-09-27 NOTE — Assessment & Plan Note (Signed)
Check glucose and A1c 

## 2018-09-27 NOTE — Progress Notes (Addendum)
BP 128/76   Pulse 99   Temp 98.9 F (37.2 C)   Ht 5\' 5"  (1.651 m)   Wt 188 lb 6.4 oz (85.5 kg)   SpO2 97%   BMI 31.35 kg/m    Subjective:    Patient ID: Randall Harvey., male    DOB: 03-26-1961, 57 y.o.   MRN: [id number]  HPI: Randall Harvey. is a 57 y.o. male  Chief Complaint  Patient presents with  . Follow-up    HPI Patient is here for f/u; since last visit with me, he has been to the ER twice, August and November; so much better now; he went to the orhtopaedist, [employer]; he had pressure from his knee causing the pain lower in the leg; he gave him a shot of prednisone and it is "all better"; the cartilage is almost gone in the left knee on the xrays  Hx of DVT and PE; on blood thinner; no bleeding from the nose, gums, urine, or rectum; would like refills on last refill currently  Obesity; trying to lose weight; eating smaller portions overall; less fried foods; going to smoke a pig this weekend, but will give a lot of it away; not salted, no extra salt other than rub  High glucose; prediabetes; last two sugars were 144 and 145; went to the [employer] and they checked a FSBS and it was in the 140s; no dry mouth; does drink sweet tea, makes his own tea and has cut back on the sugar Lab Results  Component Value Date   HGBA1C 6.4 (H) 01/05/2018    High cholesterol; low HDL; obese and smoking, male gender  Depression screen [facility] 2/9 09/27/2018 08/23/2018 01/05/2018 08/07/2016 08/28/2015  Decreased Interest 1 0 0 2 2  Down, Depressed, Hopeless 1 0 0 1 1  PHQ - 2 Score 2 0 0 3 3  Altered sleeping 1 1 - 1 -  Tired, decreased energy 1 0 - 0 -  Change in appetite 0 0 - 0 -  Feeling bad or failure about yourself  0 0 - 1 -  Trouble concentrating 0 0 - 0 -  Moving slowly or fidgety/restless 0 0 - 0 -  Suicidal thoughts 0 0 - 0 -  PHQ-9 Score 4 1 - 5 -  Difficult doing work/chores Very difficult Not difficult at all - Very difficult -   Fall Risk  09/27/2018 08/23/2018  01/05/2018 08/07/2016  Falls in the past year? 0 0 No No  Number falls in past yr: - 0 - -  Injury with Fall? - 0 - -    Relevant past medical, surgical, family and social history reviewed Past Medical History:  Diagnosis Date  . Anticoagulant long-term use    lifetime use, coumadin therapy, then Xarelto  . Anxiety   . Arthritis   . Cancer ([place])    renal  . Carotid atherosclerosis    <50% left and right  . Clotting disorder ([place])   . COPD (chronic obstructive pulmonary disease) ([place])   . Depression   . Elevated liver enzymes March 2015  . Fatty liver   . H/O drug abuse ([place])   . H/O ETOH abuse   . Hepatitis   . Hypertension   . Obesity   . Personal history of DVT (deep vein thrombosis) Oct 2014  . Pre-diabetes   . Pulmonary embolism and infarction [facility]) Oct 2014  . Pulmonary embolism and infarction [facility]) March 2015  . Renal  mass, left Oct 2014   with ureteral obstruction  . Stroke ([place])   . Thrombocytopenia ([place])   . Tobacco abuse    Past Surgical History:  Procedure Laterality Date  . ANKLE FRACTURE SURGERY Right 12/2012  . EXCISION METACARPAL MASS Right 09/18/2016   Procedure: EXCISION METACARPAL MASS;  Surgeon: Hessie Knows, MD;  Location: [facility] ORS;  Service: Orthopedics;  Laterality: Right;  5th digit   . HERNIA REPAIR     Umbilical Hernia  . ORBITAL FRACTURE SURGERY Left   . ROBOTIC ASSITED PARTIAL NEPHRECTOMY Left Oct 2014   Family History  Problem Relation Age of Onset  . Cancer Mother        pancreatic  . Cancer Father        colon  . Diabetes Father   . Cancer Sister        colon  . COPD Sister   . Seizures Sister   . Cancer Paternal Grandmother        bone  . Stroke Paternal Grandfather   . Heart disease Paternal Grandfather   . Cancer Sister        ovarian  . Hypertension Neg Hx    Social History   Tobacco Use  . Smoking status: Current Every Day Smoker    Packs/day: 0.50    Types: Cigarettes  . Smokeless tobacco: Never Used  Substance  Use Topics  . Alcohol use: Yes    Comment: occasionally  . Drug use: Yes    Types: Marijuana    Comment: occ     Office Visit from 09/27/2018 in [facility]  AUDIT-C Score  1      Interim medical history since last visit reviewed. Allergies and medications reviewed  Review of Systems Per HPI unless specifically indicated above     Objective:    BP 128/76   Pulse 99   Temp 98.9 F (37.2 C)   Ht 5\' 5"  (1.651 m)   Wt 188 lb 6.4 oz (85.5 kg)   SpO2 97%   BMI 31.35 kg/m   Wt Readings from Last 3 Encounters:  09/27/18 188 lb 6.4 oz (85.5 kg)  08/23/18 189 lb 6.4 oz (85.9 kg)  08/15/18 184 lb 15.5 oz (83.9 kg)    Physical Exam Constitutional:      General: He is not in acute distress.    Appearance: He is well-developed. He is obese.  HENT:     Head: Normocephalic and atraumatic.  Eyes:     General: No scleral icterus. Neck:     Thyroid: No thyromegaly.  Cardiovascular:     Rate and Rhythm: Normal rate and regular rhythm.  Pulmonary:     Effort: Pulmonary effort is normal.     Breath sounds: Normal breath sounds.  Abdominal:     General: Bowel sounds are normal. There is no distension.     Palpations: Abdomen is soft.  Skin:    General: Skin is warm and dry.     Coloration: Skin is not pale.     Comments: Dilated spider vein on the left side of the nose; keratotic lesion right side of nose; few light brown keratotic lesion on the back; soft cystic lesion left side neck  Neurological:     Mental Status: He is alert.  Psychiatric:        Mood and Affect: Mood is not anxious or depressed.        Behavior: Behavior normal.  Thought Content: Thought content normal.        Judgment: Judgment normal.     Comments: Good eye contact with examiner, upbeat mood     Results for orders placed or performed during the hospital encounter of 92/11/94  Basic metabolic panel  Result Value Ref Range   Sodium 139 135 - 145 mmol/L   Potassium 4.1  3.5 - 5.1 mmol/L   Chloride 105 98 - 111 mmol/L   CO2 24 22 - 32 mmol/L   Glucose, Bld 145 (H) 70 - 99 mg/dL   BUN 10 6 - 20 mg/dL   Creatinine, Ser 0.86 0.61 - 1.24 mg/dL   Calcium 8.8 (L) 8.9 - 10.3 mg/dL   GFR calc non Af Amer >60 >60 mL/min   GFR calc Af Amer >60 >60 mL/min   Anion gap 10 5 - 15  CBC  Result Value Ref Range   WBC 8.9 3.8 - 10.6 K/uL   RBC 4.94 4.40 - 5.90 MIL/uL   Hemoglobin 16.3 13.0 - 18.0 g/dL   HCT 47.1 40.0 - 52.0 %   MCV 95.3 80.0 - 100.0 fL   MCH 33.0 26.0 - 34.0 pg   MCHC 34.7 32.0 - 36.0 g/dL   RDW 12.2 11.5 - 14.5 %   Platelets 190 150 - 440 K/uL  Troponin I  Result Value Ref Range   Troponin I <0.03 <0.03 ng/mL      Assessment & Plan:   Problem List Items Addressed This Visit      Cardiovascular and Mediastinum   Pulmonary embolus ([place])    Continue blood thinner; check CBC      Relevant Medications   rivaroxaban (XARELTO) 20 MG TABS tablet   Other Relevant Orders   COMPLETE METABOLIC PANEL WITH GFR     Digestive   Fatty liver    Check liver enzymes        Other   Tobacco abuse    Encouraged smoking cessation      Prediabetes - Primary    Check glucose and A1c       Relevant Orders   Hemoglobin A1c   Obesity    Encouraged weight loss; we reviewed healthy weight for his height, 159 pounds wouuld be ideal      Hypertriglyceridemia    Check labs today      Relevant Medications   rivaroxaban (XARELTO) 20 MG TABS tablet   Other Relevant Orders   Lipid panel    Other Visit Diagnoses    Vein symptom       Relevant Orders   Ambulatory referral to Dermatology   Medication monitoring encounter       Relevant Orders   CBC with Differential/Platelet   COMPLETE METABOLIC PANEL WITH GFR   Colon cancer screening       Relevant Orders   Ambulatory referral to Gastroenterology       Follow up plan: Return in about 1 month (around 10/28/2018) for follow-up visit with Dr. Sanda Klein or just after.  An after-visit summary was  printed and given to the patient at [place].  Please see the patient instructions which may contain other information and recommendations beyond what is mentioned above in the assessment and plan.  Meds ordered this encounter  Medications  . rivaroxaban (XARELTO) 20 MG TABS tablet    Sig: TAKE ONE TABLET BY MOUTH ONCE DAILY WITH  SUPPER    Dispense:  90 tablet    Refill:  2    Please  consider 90 day supplies to promote better adherence    Orders Placed This Encounter  Procedures  . CBC with Differential/Platelet  . COMPLETE METABOLIC PANEL WITH GFR  . Hemoglobin A1c  . Lipid panel  . Ambulatory referral to Dermatology  . Ambulatory referral to Gastroenterology

## 2018-09-27 NOTE — Assessment & Plan Note (Signed)
Encouraged weight loss; we reviewed healthy weight for his height, 159 pounds wouuld be ideal

## 2018-09-27 NOTE — Addendum Note (Signed)
Addended by: , Satira Anis on: 09/27/2018 12:25 PM   Modules accepted: Orders

## 2018-09-27 NOTE — Assessment & Plan Note (Signed)
Encouraged smoking cessation 

## 2018-09-28 LAB — CBC WITH DIFFERENTIAL/PLATELET
Absolute Monocytes: 401 cells/uL (ref 200–950)
Basophils Absolute: 45 cells/uL (ref 0–200)
Basophils Relative: 0.5 %
Eosinophils Absolute: 125 cells/uL (ref 15–500)
Eosinophils Relative: 1.4 %
HCT: 44.7 % (ref 38.5–50.0)
Hemoglobin: 15.4 g/dL (ref 13.2–17.1)
Lymphs Abs: 2492 cells/uL (ref 850–3900)
MCH: 32.3 pg (ref 27.0–33.0)
MCHC: 34.5 g/dL (ref 32.0–36.0)
MCV: 93.7 fL (ref 80.0–100.0)
MPV: 9.9 fL (ref 7.5–12.5)
Monocytes Relative: 4.5 %
Neutro Abs: 5838 cells/uL (ref 1500–7800)
Neutrophils Relative %: 65.6 %
Platelets: 202 10*3/uL (ref 140–400)
RBC: 4.77 10*6/uL (ref 4.20–5.80)
RDW: 11.8 % (ref 11.0–15.0)
Total Lymphocyte: 28 %
WBC: 8.9 10*3/uL (ref 3.8–10.8)

## 2018-09-28 LAB — COMPLETE METABOLIC PANEL WITH GFR
AG Ratio: 1.7 (calc) (ref 1.0–2.5)
ALT: 24 U/L (ref 9–46)
AST: 17 U/L (ref 10–35)
Albumin: 4.3 g/dL (ref 3.6–5.1)
Alkaline phosphatase (APISO): 67 U/L (ref 40–115)
BUN: 15 mg/dL (ref 7–25)
CO2: 23 mmol/L (ref 20–32)
Calcium: 8.9 mg/dL (ref 8.6–10.3)
Chloride: 108 mmol/L (ref 98–110)
Creat: 0.89 mg/dL (ref 0.70–1.33)
GFR, Est African American: 108 mL/min/{1.73_m2} (ref >=60)
GFR, Est Non African American: 94 mL/min/{1.73_m2} (ref >=60)
Globulin: 2.5 g/dL (calc) (ref 1.9–3.7)
Glucose, Bld: 131 mg/dL — ABNORMAL HIGH (ref 65–99)
Potassium: 4.1 mmol/L (ref 3.5–5.3)
Sodium: 140 mmol/L (ref 135–146)
Total Bilirubin: 0.7 mg/dL (ref 0.2–1.2)
Total Protein: 6.8 g/dL (ref 6.1–8.1)

## 2018-09-28 LAB — LIPID PANEL
Cholesterol: 169 mg/dL (ref <200)
HDL: 36 mg/dL — ABNORMAL LOW (ref >40)
LDL Cholesterol (Calc): 98 mg/dL (calc)
Non-HDL Cholesterol (Calc): 133 mg/dL (calc) — ABNORMAL HIGH (ref <130)
Total CHOL/HDL Ratio: 4.7 (calc) (ref <5.0)
Triglycerides: 230 mg/dL — ABNORMAL HIGH (ref <150)

## 2018-09-28 LAB — HEMOGLOBIN A1C
Hgb A1c MFr Bld: 6.7 % of total Hgb — ABNORMAL HIGH (ref <5.7)
Mean Plasma Glucose: 146 (calc)
eAG (mmol/L): 8.1 (calc)

## 2018-10-04 ENCOUNTER — Other Ambulatory Visit: Payer: Self-pay

## 2018-10-04 DIAGNOSIS — Z1211 Encounter for screening for malignant neoplasm of colon: Secondary | ICD-10-CM

## 2018-10-05 ENCOUNTER — Encounter: Payer: Self-pay | Admitting: Family Medicine

## 2018-10-05 DIAGNOSIS — E669 Obesity, unspecified: Secondary | ICD-10-CM | POA: Insufficient documentation

## 2018-10-05 DIAGNOSIS — E1169 Type 2 diabetes mellitus with other specified complication: Secondary | ICD-10-CM | POA: Insufficient documentation

## 2018-10-07 NOTE — Discharge Instructions (Signed)
General Anesthesia, Adult, Care After  This sheet gives you information about how to care for yourself after your procedure. Your health care provider may also give you more specific instructions. If you have problems or questions, contact your health care provider.  What can I expect after the procedure?  After the procedure, the following side effects are common:  Pain or discomfort at the IV site.  Nausea.  Vomiting.  Sore throat.  Trouble concentrating.  Feeling cold or chills.  Weak or tired.  Sleepiness and fatigue.  Soreness and body aches. These side effects can affect parts of the body that were not involved in surgery.  Follow these instructions at home:    For at least 24 hours after the procedure:  Have a responsible adult stay with you. It is important to have someone help care for you until you are awake and alert.  Rest as needed.  Do not:  Participate in activities in which you could fall or become injured.  Drive.  Use heavy machinery.  Drink alcohol.  Take sleeping pills or medicines that cause drowsiness.  Make important decisions or sign legal documents.  Take care of children on your own.  Eating and drinking  Follow any instructions from your health care provider about eating or drinking restrictions.  When you feel hungry, start by eating small amounts of foods that are soft and easy to digest (bland), such as toast. Gradually return to your regular diet.  Drink enough fluid to keep your urine pale yellow.  If you vomit, rehydrate by drinking water, juice, or clear broth.  General instructions  If you have sleep apnea, surgery and certain medicines can increase your risk for breathing problems. Follow instructions from your health care provider about wearing your sleep device:  Anytime you are sleeping, including during daytime naps.  While taking prescription pain medicines, sleeping medicines, or medicines that make you drowsy.  Return to your normal activities as told by your health care  provider. Ask your health care provider what activities are safe for you.  Take over-the-counter and prescription medicines only as told by your health care provider.  If you smoke, do not smoke without supervision.  Keep all follow-up visits as told by your health care provider. This is important.  Contact a health care provider if:  You have nausea or vomiting that does not get better with medicine.  You cannot eat or drink without vomiting.  You have pain that does not get better with medicine.  You are unable to pass urine.  You develop a skin rash.  You have a fever.  You have redness around your IV site that gets worse.  Get help right away if:  You have difficulty breathing.  You have chest pain.  You have blood in your urine or stool, or you vomit blood.  Summary  After the procedure, it is common to have a sore throat or nausea. It is also common to feel tired.  Have a responsible adult stay with you for the first 24 hours after general anesthesia. It is important to have someone help care for you until you are awake and alert.  When you feel hungry, start by eating small amounts of foods that are soft and easy to digest (bland), such as toast. Gradually return to your regular diet.  Drink enough fluid to keep your urine pale yellow.  Return to your normal activities as told by your health care provider. Ask your health care   provider what activities are safe for you.  This information is not intended to replace advice given to you by your health care provider. Make sure you discuss any questions you have with your health care provider.  Document Released: 12/22/2000 Document Revised: 05/01/2017 Document Reviewed: 05/01/2017  Elsevier Interactive Patient Education  2019 [employer]vier Inc[employer]

## 2018-10-12 ENCOUNTER — Encounter: Admission: RE | Payer: Self-pay | Source: Home / Self Care

## 2018-10-12 ENCOUNTER — Ambulatory Visit: Admission: RE | Admit: 2018-10-12 | Payer: Medicare HMO | Source: Home / Self Care | Admitting: Gastroenterology

## 2018-10-12 SURGERY — COLONOSCOPY WITH PROPOFOL
Anesthesia: Choice

## 2018-12-01 DIAGNOSIS — M170 Bilateral primary osteoarthritis of knee: Secondary | ICD-10-CM | POA: Diagnosis not present

## 2018-12-07 ENCOUNTER — Telehealth: Payer: Self-pay | Admitting: Family Medicine

## 2018-12-07 NOTE — Telephone Encounter (Signed)
Left message for patient to please call me at [phone] to schedule his Annual Wellness Visit with [facility].

## 2019-02-22 ENCOUNTER — Encounter: Payer: Self-pay | Admitting: Emergency Medicine

## 2019-02-22 ENCOUNTER — Emergency Department
Admission: EM | Admit: 2019-02-22 | Discharge: 2019-02-22 | Disposition: A | Discharge status: Home / Self Care | Payer: Medicare Other | Attending: Emergency Medicine | Admitting: Emergency Medicine

## 2019-02-22 ENCOUNTER — Other Ambulatory Visit: Payer: Self-pay

## 2019-02-22 ENCOUNTER — Emergency Department: Payer: Medicare Other

## 2019-02-22 ENCOUNTER — Ambulatory Visit: Payer: Self-pay

## 2019-02-22 DIAGNOSIS — I10 Essential (primary) hypertension: Secondary | ICD-10-CM | POA: Insufficient documentation

## 2019-02-22 DIAGNOSIS — Z85048 Personal history of other malignant neoplasm of rectum, rectosigmoid junction, and anus: Secondary | ICD-10-CM | POA: Diagnosis not present

## 2019-02-22 DIAGNOSIS — R0602 Shortness of breath: Secondary | ICD-10-CM | POA: Insufficient documentation

## 2019-02-22 DIAGNOSIS — R0789 Other chest pain: Secondary | ICD-10-CM | POA: Diagnosis not present

## 2019-02-22 DIAGNOSIS — Z8673 Personal history of transient ischemic attack (TIA), and cerebral infarction without residual deficits: Secondary | ICD-10-CM | POA: Diagnosis not present

## 2019-02-22 DIAGNOSIS — E119 Type 2 diabetes mellitus without complications: Secondary | ICD-10-CM | POA: Diagnosis not present

## 2019-02-22 DIAGNOSIS — Z20828 Contact with and (suspected) exposure to other viral communicable diseases: Secondary | ICD-10-CM | POA: Insufficient documentation

## 2019-02-22 DIAGNOSIS — Z7901 Long term (current) use of anticoagulants: Secondary | ICD-10-CM | POA: Insufficient documentation

## 2019-02-22 DIAGNOSIS — F17210 Nicotine dependence, cigarettes, uncomplicated: Secondary | ICD-10-CM | POA: Insufficient documentation

## 2019-02-22 DIAGNOSIS — J449 Chronic obstructive pulmonary disease, unspecified: Secondary | ICD-10-CM | POA: Diagnosis not present

## 2019-02-22 DIAGNOSIS — J984 Other disorders of lung: Secondary | ICD-10-CM | POA: Diagnosis not present

## 2019-02-22 DIAGNOSIS — I700 Atherosclerosis of aorta: Secondary | ICD-10-CM | POA: Diagnosis not present

## 2019-02-22 LAB — COMPREHENSIVE METABOLIC PANEL
ALT: 37 U/L (ref 0–44)
AST: 25 U/L (ref 15–41)
Albumin: 4.1 g/dL (ref 3.5–5.0)
Alkaline Phosphatase: 68 U/L (ref 38–126)
Anion gap: 11 (ref 5–15)
BUN: 16 mg/dL (ref 6–20)
CO2: 21 mmol/L — ABNORMAL LOW (ref 22–32)
Calcium: 9.2 mg/dL (ref 8.9–10.3)
Chloride: 106 mmol/L (ref 98–111)
Creatinine, Ser: 0.85 mg/dL (ref 0.61–1.24)
GFR calc Af Amer: >60 mL/min (ref >60)
GFR calc non Af Amer: >60 mL/min (ref >60)
Glucose, Bld: 148 mg/dL — ABNORMAL HIGH (ref 70–99)
Potassium: 4 mmol/L (ref 3.5–5.1)
Sodium: 138 mmol/L (ref 135–145)
Total Bilirubin: 0.7 mg/dL (ref 0.3–1.2)
Total Protein: 7.4 g/dL (ref 6.5–8.1)

## 2019-02-22 LAB — CBC
HCT: 48.2 % (ref 39.0–52.0)
Hemoglobin: 16.4 g/dL (ref 13.0–17.0)
MCH: 32.5 pg (ref 26.0–34.0)
MCHC: 34 g/dL (ref 30.0–36.0)
MCV: 95.6 fL (ref 80.0–100.0)
Platelets: 218 10*3/uL (ref 150–400)
RBC: 5.04 MIL/uL (ref 4.22–5.81)
RDW: 11.5 % (ref 11.5–15.5)
WBC: 8.4 10*3/uL (ref 4.0–10.5)
nRBC: 0 % (ref 0.0–0.2)

## 2019-02-22 LAB — TROPONIN I
Troponin I: <0.03 ng/mL (ref <0.03)
Troponin I: <0.03 ng/mL (ref <0.03)

## 2019-02-22 LAB — SARS CORONAVIRUS 2 BY RT PCR (HOSPITAL ORDER, PERFORMED IN ~~LOC~~ HOSPITAL LAB): SARS Coronavirus 2: NEGATIVE

## 2019-02-22 MED ORDER — IOHEXOL 350 MG/ML SOLN
75.0000 mL | Freq: Once | INTRAVENOUS | Status: AC | PRN
  Administered 2019-02-22: 75 mL via INTRAVENOUS
  Filled 2019-02-22: qty 75

## 2019-02-22 MED ORDER — RIVAROXABAN (XARELTO) VTE STARTER PACK (15 & 20 MG): ORAL_TABLET | ORAL | 0 refills | Status: DC

## 2019-02-22 MED ORDER — ALBUTEROL SULFATE (2.5 MG/3ML) 0.083% IN NEBU: 5.0000 mg | INHALATION_SOLUTION | Freq: Once | RESPIRATORY_TRACT | Status: DC

## 2019-02-22 NOTE — ED Provider Notes (Signed)
[facility] Emergency Department Provider Note   First MD Initiated Contact with Patient 02/22/19 1403     (approximate)  I have reviewed the triage vital signs and the nursing notes.   HISTORY  Chief Complaint Shortness of Breath and Chest Pain    HPI Randall Harvey. is a 58 y.o. male with below list of previous medical conditions including multiple PEs for which the patient was prescribed Xarelto which he has not taken in the past 2 months presents to the emergency department with acute onset of central chest tightness and dyspnea which patient stated began on Saturday.  Patient denies any cough no fever.  Patient denies any nausea or vomiting.  Patient discontinued taking Xarelto by choice.   Patient states that he would like to be tested for COVID-19 as well.     Past Medical History:  Diagnosis Date  . Anticoagulant long-term use    lifetime use, coumadin therapy, then Xarelto  . Anxiety   . Arthritis   . Cancer ([place])    renal  . Carotid atherosclerosis    <50% left and right  . Clotting disorder ([place])   . COPD (chronic obstructive pulmonary disease) ([place])   . Depression   . Elevated liver enzymes March 2015  . Fatty liver   . H/O drug abuse ([place])   . H/O ETOH abuse   . Hepatitis   . Hypertension   . Obesity   . Personal history of DVT (deep vein thrombosis) Oct 2014  . Pre-diabetes   . Pulmonary embolism and infarction [facility]) Oct 2014  . Pulmonary embolism and infarction [facility]) March 2015  . Renal mass, left Oct 2014   with ureteral obstruction  . Stroke ([place])   . Thrombocytopenia ([place])   . Tobacco abuse     Patient Active Problem List   Diagnosis Date Noted  . Type 2 diabetes mellitus with obesity ([place]) 10/05/2018  . Hypertriglyceridemia 09/27/2018  . DVT of lower limb, acute ([place]) 01/05/2018  . Marijuana use 08/08/2016  . Anxiety disorder 09/01/2015  . Obesity 09/01/2015  . Tobacco abuse 09/01/2015  . Fatty liver 09/01/2015   . Ganglion cyst of flexor tendon sheath of finger 08/28/2015  . Knee pain, bilateral 08/28/2015  . Pulmonary embolus ([place]) 08/28/2015  . Erectile dysfunction 08/28/2015  . 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 Renal cell carcinoma ([place]) 02/22/2015  . COPD, mild ([place]) 06/06/2014    Past Surgical History:  Procedure Laterality Date  . ANKLE FRACTURE SURGERY Right 12/2012  . EXCISION METACARPAL MASS Right 09/18/2016   Procedure: EXCISION METACARPAL MASS;  Surgeon: Hessie Knows, MD;  Location: [facility] ORS;  Service: Orthopedics;  Laterality: Right;  5th digit   . HERNIA REPAIR     Umbilical Hernia  . ORBITAL FRACTURE SURGERY Left   . ROBOTIC ASSITED PARTIAL NEPHRECTOMY Left Oct 2014    Prior to Admission medications   Medication Sig Start Date End Date Taking? Authorizing Provider  docusate sodium (COLACE) 100 MG capsule Take 1 tablet once or twice daily as needed for constipation while taking narcotic pain medicine Patient not taking: Reported on 09/27/2018 08/16/18   Hinda Kehr, MD  rivaroxaban (XARELTO) 20 MG TABS tablet TAKE ONE TABLET BY MOUTH ONCE DAILY WITH  SUPPER 09/27/18   Lada, Satira Anis, MD    Allergies Patient has no known allergies.  Family History  Problem Relation Age of Onset  . Cancer Mother  pancreatic  . Cancer Father        colon  . Diabetes Father   . Cancer Sister        colon  . COPD Sister   . Seizures Sister   . Cancer Paternal Grandmother        bone  . Stroke Paternal Grandfather   . Heart disease Paternal Grandfather   . Cancer Sister        ovarian  . Hypertension Neg Hx     Social History Social History   Tobacco Use  . Smoking status: Current Every Day Smoker    Packs/day: 0.50    Types: Cigarettes  . Smokeless tobacco: Never Used  Substance Use Topics  . Alcohol use: Yes    Comment: occasionally  . Drug use: Yes    Types: Marijuana    Comment: occ    Review of Systems Constitutional: No fever/chills  Eyes: No visual changes. ENT: No sore throat. Cardiovascular: Positive for chest pain. Respiratory: Positive for shortness of breath. Gastrointestinal: No abdominal pain.  No nausea, no vomiting.  No diarrhea.  No constipation. Genitourinary: Negative for dysuria. Musculoskeletal: Negative for neck pain.  Negative for back pain. Integumentary: Negative for rash. Neurological: Negative for headaches, focal weakness or numbness.   ____________________________________________   PHYSICAL EXAM:  VITAL SIGNS: ED Triage Vitals  Enc Vitals Group     BP 02/22/19 1316 (!) 148/88     Pulse Rate 02/22/19 1316 95     Resp 02/22/19 1316 20     Temp 02/22/19 1316 98.7 F (37.1 C)     Temp Source 02/22/19 1316 Oral     SpO2 02/22/19 1316 97 %     Weight 02/22/19 1316 83.9 kg (185 lb)     Height 02/22/19 1316 1.702 m (5\' 7" )     Head Circumference --      Peak Flow --      Pain Score 02/22/19 1317 0     Pain Loc --      Pain Edu? --      Excl. in [place]? --     Constitutional: Alert and oriented. Well appearing and in no acute distress. Eyes: Conjunctivae are normal. Mouth/Throat: Mucous membranes are moist.  Oropharynx non-erythematous. Neck: No stridor.   Cardiovascular: Normal rate, regular rhythm. Good peripheral circulation. Grossly normal heart sounds. Respiratory: Normal respiratory effort.  No retractions. No audible wheezing. Gastrointestinal: Soft and nontender. No distention.  Musculoskeletal: No lower extremity tenderness nor edema. No gross deformities of extremities. Neurologic:  Normal speech and language. No gross focal neurologic deficits are appreciated.  Skin:  Skin is warm, dry and intact. No rash noted. Psychiatric: Mood and affect are normal. Speech and behavior are normal.  ____________________________________________   LABS (all labs ordered are listed, but only abnormal results are displayed)  Labs Reviewed  SARS CORONAVIRUS 2 (HOSPITAL ORDER, [facility])  CBC  COMPREHENSIVE METABOLIC PANEL  TROPONIN I   ____________________________________________  EKG  ED ECG REPORT I, [place], the attending physician, personally viewed and interpreted this ECG.   Date: 02/22/2019  EKG Time: 1:11 PM  Rate: 95  Rhythm: Normal sinus rhythm with premature ventricular contraction.  Axis: Normal  Intervals: Normal  ST&T Change: None  ____________________________________________  RADIOLOGY I, [place] N , personally viewed and evaluated these images (plain radiographs) as part of my medical decision making, as well as reviewing the written report by the radiologist.  ED MD  interpretation: This x-ray revealed mild bilateral interstitial prominence per radiologist.   The angiogram of the chest revealed no evidence of pulmonary emboli per radiologist  Official radiology report(s): Dg Chest 1 View  Result Date: 02/22/2019 CLINICAL DATA:  Chest pain.  Shortness of breath. EXAM: CHEST  1 VIEW COMPARISON:  04/12/2015. FINDINGS: Mediastinum and hilar structures normal. Very mild bilateral interstitial prominence. Mild pneumonitis cannot be excluded. No pleural effusion or pneumothorax. Heart size normal. IMPRESSION: Very mild bilateral interstitial prominence. Mild pneumonitis cannot be excluded. Electronically Signed   By: Marcello Moores  Register   On: 02/22/2019 13:47    __________________________________________ Procedures   ____________________________________________   INITIAL IMPRESSION / MDM / [place] / ED COURSE  As part of my medical decision making, I reviewed the following data within the electronic MEDICAL RECORD NUMBER   58 year old male presenting with above-stated history and physical exam concerning for possible CAD/MI versus pulmonary emboli given Xarelto noncompliance.  EKG revealed no evidence of ischemia or infarction.  Laboratory data including troponin x2-.  CT angiogram of the chest  revealed no evidence of pulmonary emboli.  Patient asymptomatic at present.  Patient was tested for COVID-19 as well which is negative.  I spoke with the patient at length regarding the necessity of resuming his Xarelto which I will prescribe and the patient assured me that he would follow-up with Dr. a lot of tomorrow.  *[name] was evaluated in Emergency Department on 02/22/2019 for the symptoms described in the history of present illness. He was evaluated in the context of the global COVID-19 pandemic, which necessitated consideration that the patient might be at risk for infection with the SARS-CoV-2 virus that causes COVID-19. Institutional protocols and algorithms that pertain to the evaluation of patients at risk for COVID-19 are in a state of rapid change based on information released by regulatory bodies including the CDC and federal and state organizations. These policies and algorithms were followed during the patient's care in the ED.  Some ED evaluations and interventions may be delayed as a result of limited staffing during the pandemic.*    ____________________________________________  FINAL CLINICAL IMPRESSION(S) / ED DIAGNOSES  Final diagnoses:  Shortness of breath     MEDICATIONS GIVEN DURING THIS VISIT:  Medications - No data to display   ED Discharge Orders    None       Note:  This document was prepared using Dragon voice recognition software and may include unintentional dictation errors.   Gregor Hams, MD 02/23/19 2239

## 2019-02-22 NOTE — ED Notes (Signed)
MD at bedside. 

## 2019-02-22 NOTE — Telephone Encounter (Signed)
Pt called with throat pain that traveled to the middle of his chest. Pt stated is began on Saturday and by Sunday the throat pain had went away.  Pt asymptomatic at time of the call. Pt with h/o 2 CVA and reported that he has not taken his Xarelto for the past 2 months. Pt only symptom today is fatigue.  Pt is a smoker. Called FC at Dr Delight Ovens office to confirm pt is to go to ED as NT advised. Pt given care advice and pt verbalized understaning. Pt going to [facility] ED.   Reason for Disposition . History of prior "blood clot" in leg or lungs (i.e., deep vein thrombosis, pulmonary embolism)  Answer Assessment - Initial Assessment Questions 1. LOCATION: "Where does it hurt?"       Throat to middle of chest felt like a air bubble in esophagus- hurt to breathe- not having symptoms couldn't take a deep breath 2. RADIATION: "Does the pain go anywhere else?" (e.g., into neck, jaw, arms, back)     Neck to middle of chest 3. ONSET: "When did the chest pain begin?" (Minutes, hours or days)      Saturday 4. PATTERN "Does the pain come and go, or has it been constant since it started?"  "Does it get worse with exertion?"      Constant and was worse Eased up Sunday morning 5. DURATION: "How long does it last" (e.g., seconds, minutes, hours)     Not hurting 6. SEVERITY: "How bad is the pain?"  (e.g., Scale 1-10; mild, moderate, or severe)    - MILD (1-3): doesn't interfere with normal activities     - MODERATE (4-7): interferes with normal activities or awakens from sleep    - SEVERE (8-10): excruciating pain, unable to do any normal activities       No pain 7. CARDIAC RISK FACTORS: "Do you have any history of heart problems or risk factors for heart disease?" (e.g., prior heart attack, angina; high blood pressure, diabetes, being overweight, high cholesterol, smoking, or strong family history of heart disease)     2 CVA - not taking Xarelto in past 2 months-blood clots 8. PULMONARY RISK FACTORS: "Do you have  any history of lung disease?"  (e.g., blood clots in lung, asthma, emphysema, birth control pills)     Blood clots in lungs 9. CAUSE: "What do you think is causing the chest pain?"     Not sure 10. OTHER SYMPTOMS: "Do you have any other symptoms?" (e.g., dizziness, nausea, vomiting, sweating, fever, difficulty breathing, cough)       Feel fatigue 11. PREGNANCY: "Is there any chance you are pregnant?" "When was your last menstrual period?"       n/a  Protocols used: CHEST PAIN-A-AH

## 2019-02-22 NOTE — ED Notes (Signed)
Pt has returned from CT. In NAD at this time.

## 2019-02-22 NOTE — ED Notes (Signed)
MD at bedside to update patient
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
## 2019-04-29 DIAGNOSIS — M170 Bilateral primary osteoarthritis of knee: Secondary | ICD-10-CM | POA: Diagnosis not present

## 2020-05-11 IMAGING — DX CHEST  1 VIEW
1 series · 1 of 1 positions shown · non-contrast
Comparison: 04/12/2015.

CLINICAL DATA: Chest pain.  Shortness of breath.

EXAM:
CHEST  1 VIEW

[chest ap]
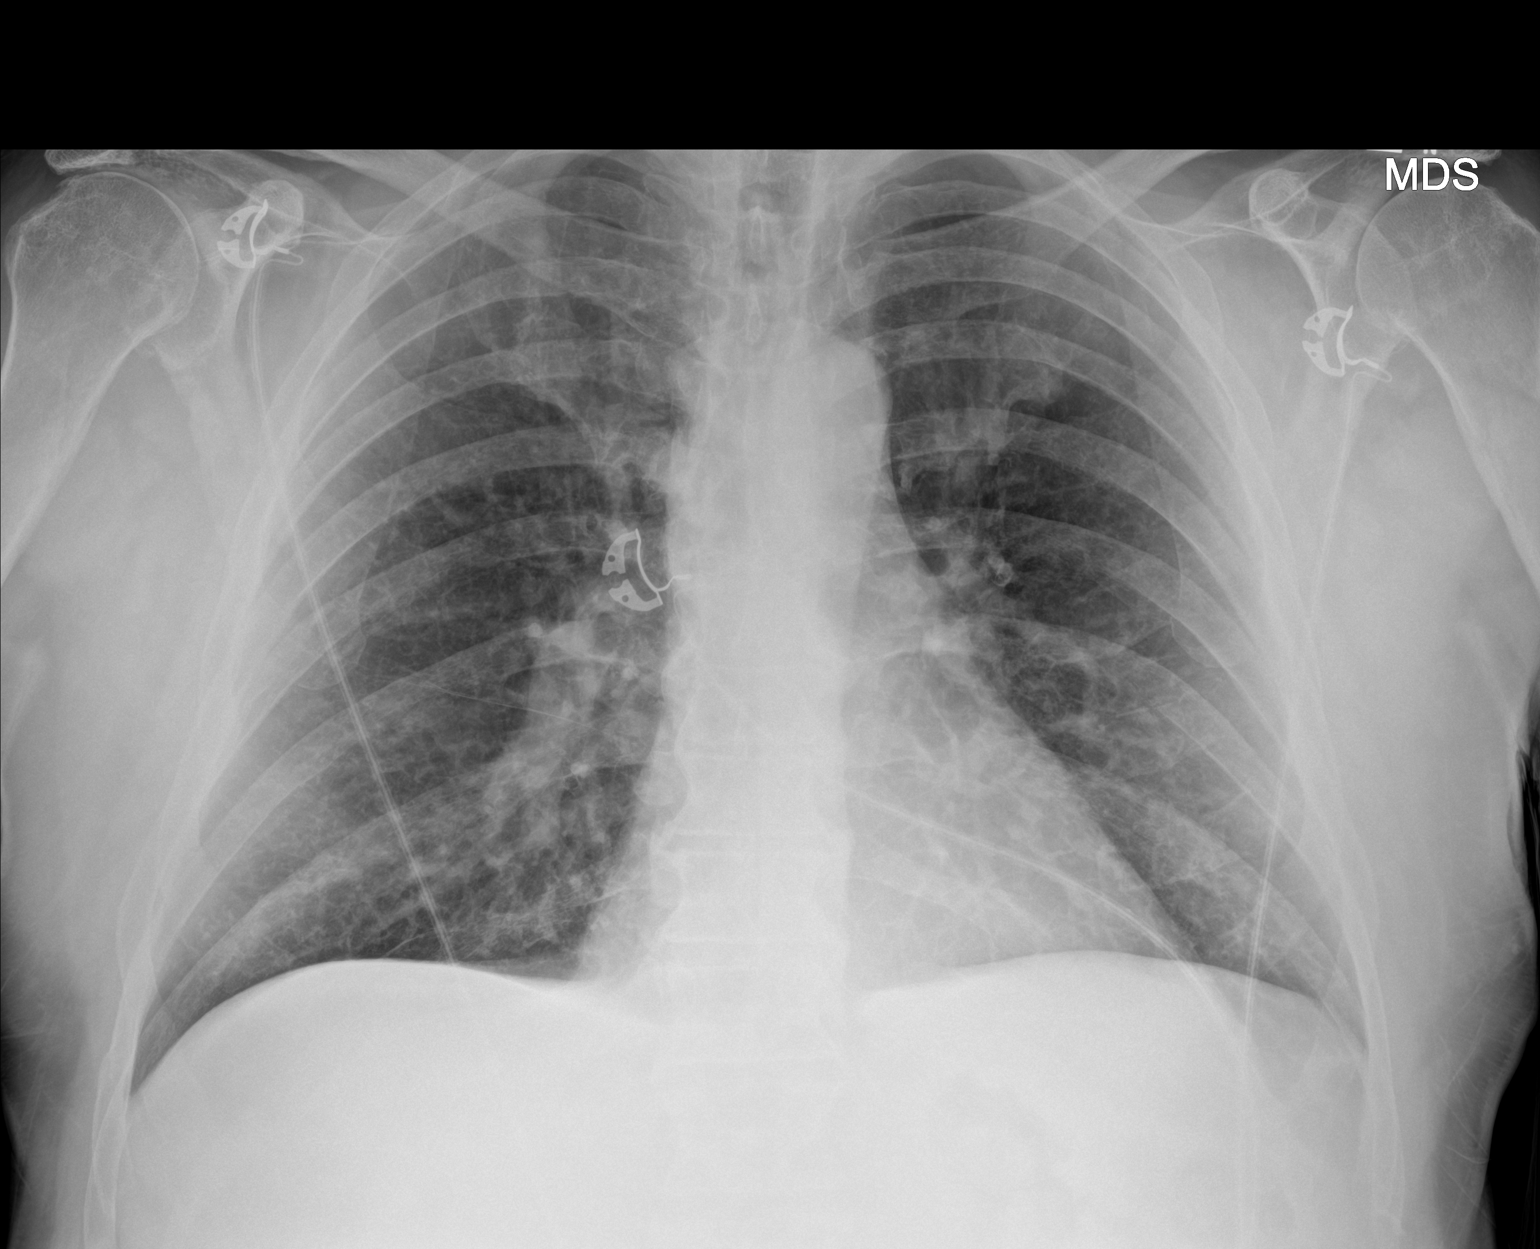

[1 of 1 positions shown; findings below may reference images not displayed]

FINDINGS: Mediastinum and hilar structures normal. Very mild bilateral
interstitial prominence. Mild pneumonitis cannot be excluded. No
pleural effusion or pneumothorax. Heart size normal.
IMPRESSION: Very mild bilateral interstitial prominence. Mild pneumonitis cannot
be excluded.

## 2020-10-29 ENCOUNTER — Emergency Department: Payer: Medicare Other

## 2020-10-29 ENCOUNTER — Encounter: Payer: Self-pay | Admitting: Intensive Care

## 2020-10-29 ENCOUNTER — Other Ambulatory Visit: Payer: Self-pay

## 2020-10-29 ENCOUNTER — Emergency Department
Admission: EM | Admit: 2020-10-29 | Discharge: 2020-10-29 | Disposition: A | Discharge status: Home / Self Care | Payer: Medicare Other | Attending: Emergency Medicine | Admitting: Emergency Medicine

## 2020-10-29 DIAGNOSIS — M791 Myalgia, unspecified site: Secondary | ICD-10-CM | POA: Diagnosis not present

## 2020-10-29 DIAGNOSIS — R079 Chest pain, unspecified: Secondary | ICD-10-CM | POA: Diagnosis not present

## 2020-10-29 DIAGNOSIS — R069 Unspecified abnormalities of breathing: Secondary | ICD-10-CM | POA: Diagnosis not present

## 2020-10-29 DIAGNOSIS — R509 Fever, unspecified: Secondary | ICD-10-CM | POA: Insufficient documentation

## 2020-10-29 DIAGNOSIS — Z743 Need for continuous supervision: Secondary | ICD-10-CM | POA: Diagnosis not present

## 2020-10-29 DIAGNOSIS — Z5321 Procedure and treatment not carried out due to patient leaving prior to being seen by health care provider: Secondary | ICD-10-CM | POA: Insufficient documentation

## 2020-10-29 DIAGNOSIS — G4489 Other headache syndrome: Secondary | ICD-10-CM | POA: Diagnosis not present

## 2020-10-29 DIAGNOSIS — R0602 Shortness of breath: Secondary | ICD-10-CM | POA: Insufficient documentation

## 2020-10-29 LAB — COMPREHENSIVE METABOLIC PANEL
ALT: 21 U/L (ref 0–44)
AST: 20 U/L (ref 15–41)
Albumin: 3.6 g/dL (ref 3.5–5.0)
Alkaline Phosphatase: 67 U/L (ref 38–126)
Anion gap: 14 (ref 5–15)
BUN: 8 mg/dL (ref 8–23)
CO2: 23 mmol/L (ref 22–32)
Calcium: 8.6 mg/dL — ABNORMAL LOW (ref 8.9–10.3)
Chloride: 101 mmol/L (ref 98–111)
Creatinine, Ser: 0.83 mg/dL (ref 0.61–1.24)
GFR, Estimated: >60 mL/min (ref >60)
Glucose, Bld: 164 mg/dL — ABNORMAL HIGH (ref 70–99)
Potassium: 4.1 mmol/L (ref 3.5–5.1)
Sodium: 138 mmol/L (ref 135–145)
Total Bilirubin: 0.7 mg/dL (ref 0.3–1.2)
Total Protein: 7.1 g/dL (ref 6.5–8.1)

## 2020-10-29 LAB — CBC WITH DIFFERENTIAL/PLATELET
Abs Immature Granulocytes: 0.02 10*3/uL (ref 0.00–0.07)
Basophils Absolute: 0 10*3/uL (ref 0.0–0.1)
Basophils Relative: 1 %
Eosinophils Absolute: 0.1 10*3/uL (ref 0.0–0.5)
Eosinophils Relative: 2 %
HCT: 45.7 % (ref 39.0–52.0)
Hemoglobin: 15.2 g/dL (ref 13.0–17.0)
Immature Granulocytes: 0 %
Lymphocytes Relative: 35 %
Lymphs Abs: 2.8 10*3/uL (ref 0.7–4.0)
MCH: 32.3 pg (ref 26.0–34.0)
MCHC: 33.3 g/dL (ref 30.0–36.0)
MCV: 97 fL (ref 80.0–100.0)
Monocytes Absolute: 0.8 10*3/uL (ref 0.1–1.0)
Monocytes Relative: 10 %
Neutro Abs: 4.2 10*3/uL (ref 1.7–7.7)
Neutrophils Relative %: 52 %
Platelets: 157 10*3/uL (ref 150–400)
RBC: 4.71 MIL/uL (ref 4.22–5.81)
RDW: 11.1 % — ABNORMAL LOW (ref 11.5–15.5)
WBC: 7.9 10*3/uL (ref 4.0–10.5)
nRBC: 0 % (ref 0.0–0.2)

## 2020-10-29 LAB — TROPONIN I (HIGH SENSITIVITY): Troponin I (High Sensitivity): 7 ng/L (ref <18)

## 2020-10-29 NOTE — ED Triage Notes (Signed)
Patient c/o sob, fever, body aches, and chest pains.

## 2021-06-15 ENCOUNTER — Inpatient Hospital Stay (HOSPITAL_COMMUNITY)
Admission: EM | Admit: 2021-06-15 | Discharge: 2021-07-30 | DRG: 082 | Disposition: A | Discharge status: Skilled Nursing Facility | Payer: Medicare Other | Attending: General Surgery | Admitting: General Surgery

## 2021-06-15 ENCOUNTER — Emergency Department (HOSPITAL_COMMUNITY): Payer: Medicare Other

## 2021-06-15 ENCOUNTER — Other Ambulatory Visit: Payer: Self-pay

## 2021-06-15 ENCOUNTER — Encounter (HOSPITAL_COMMUNITY): Payer: Self-pay | Admitting: *Deleted

## 2021-06-15 DIAGNOSIS — R0989 Other specified symptoms and signs involving the circulatory and respiratory systems: Secondary | ICD-10-CM

## 2021-06-15 DIAGNOSIS — Z20822 Contact with and (suspected) exposure to covid-19: Secondary | ICD-10-CM | POA: Diagnosis present

## 2021-06-15 DIAGNOSIS — S066X9A Traumatic subarachnoid hemorrhage with loss of consciousness of unspecified duration, initial encounter: Principal | ICD-10-CM | POA: Diagnosis present

## 2021-06-15 DIAGNOSIS — D181 Lymphangioma, any site: Secondary | ICD-10-CM | POA: Diagnosis present

## 2021-06-15 DIAGNOSIS — D72829 Elevated white blood cell count, unspecified: Secondary | ICD-10-CM | POA: Diagnosis not present

## 2021-06-15 DIAGNOSIS — M79642 Pain in left hand: Secondary | ICD-10-CM

## 2021-06-15 DIAGNOSIS — I10 Essential (primary) hypertension: Secondary | ICD-10-CM | POA: Diagnosis present

## 2021-06-15 DIAGNOSIS — S0240DA Maxillary fracture, left side, initial encounter for closed fracture: Secondary | ICD-10-CM | POA: Diagnosis present

## 2021-06-15 DIAGNOSIS — F17210 Nicotine dependence, cigarettes, uncomplicated: Secondary | ICD-10-CM | POA: Diagnosis present

## 2021-06-15 DIAGNOSIS — Z978 Presence of other specified devices: Secondary | ICD-10-CM

## 2021-06-15 DIAGNOSIS — Y92230 Patient room in hospital as the place of occurrence of the external cause: Secondary | ICD-10-CM | POA: Diagnosis not present

## 2021-06-15 DIAGNOSIS — Z9289 Personal history of other medical treatment: Secondary | ICD-10-CM

## 2021-06-15 DIAGNOSIS — Z781 Physical restraint status: Secondary | ICD-10-CM | POA: Diagnosis not present

## 2021-06-15 DIAGNOSIS — J449 Chronic obstructive pulmonary disease, unspecified: Secondary | ICD-10-CM | POA: Diagnosis present

## 2021-06-15 DIAGNOSIS — R404 Transient alteration of awareness: Secondary | ICD-10-CM | POA: Diagnosis present

## 2021-06-15 DIAGNOSIS — Z85528 Personal history of other malignant neoplasm of kidney: Secondary | ICD-10-CM

## 2021-06-15 DIAGNOSIS — W19XXXA Unspecified fall, initial encounter: Secondary | ICD-10-CM | POA: Diagnosis present

## 2021-06-15 DIAGNOSIS — J9601 Acute respiratory failure with hypoxia: Secondary | ICD-10-CM | POA: Diagnosis present

## 2021-06-15 DIAGNOSIS — R1310 Dysphagia, unspecified: Secondary | ICD-10-CM | POA: Diagnosis present

## 2021-06-15 DIAGNOSIS — R591 Generalized enlarged lymph nodes: Secondary | ICD-10-CM | POA: Diagnosis present

## 2021-06-15 DIAGNOSIS — J9690 Respiratory failure, unspecified, unspecified whether with hypoxia or hypercapnia: Secondary | ICD-10-CM

## 2021-06-15 DIAGNOSIS — R402362 Coma scale, best motor response, obeys commands, at arrival to emergency department: Secondary | ICD-10-CM | POA: Diagnosis present

## 2021-06-15 DIAGNOSIS — S0219XA Other fracture of base of skull, initial encounter for closed fracture: Secondary | ICD-10-CM | POA: Diagnosis present

## 2021-06-15 DIAGNOSIS — Z8673 Personal history of transient ischemic attack (TIA), and cerebral infarction without residual deficits: Secondary | ICD-10-CM | POA: Diagnosis not present

## 2021-06-15 DIAGNOSIS — R402142 Coma scale, eyes open, spontaneous, at arrival to emergency department: Secondary | ICD-10-CM | POA: Diagnosis present

## 2021-06-15 DIAGNOSIS — F1010 Alcohol abuse, uncomplicated: Secondary | ICD-10-CM | POA: Diagnosis present

## 2021-06-15 DIAGNOSIS — R451 Restlessness and agitation: Secondary | ICD-10-CM | POA: Diagnosis present

## 2021-06-15 DIAGNOSIS — Z86711 Personal history of pulmonary embolism: Secondary | ICD-10-CM | POA: Diagnosis not present

## 2021-06-15 DIAGNOSIS — R6889 Other general symptoms and signs: Secondary | ICD-10-CM

## 2021-06-15 DIAGNOSIS — S0291XA Unspecified fracture of skull, initial encounter for closed fracture: Secondary | ICD-10-CM | POA: Diagnosis present

## 2021-06-15 DIAGNOSIS — T1490XA Injury, unspecified, initial encounter: Secondary | ICD-10-CM

## 2021-06-15 DIAGNOSIS — X58XXXA Exposure to other specified factors, initial encounter: Secondary | ICD-10-CM | POA: Diagnosis present

## 2021-06-15 DIAGNOSIS — E440 Moderate protein-calorie malnutrition: Secondary | ICD-10-CM | POA: Insufficient documentation

## 2021-06-15 DIAGNOSIS — M25562 Pain in left knee: Secondary | ICD-10-CM

## 2021-06-15 DIAGNOSIS — R509 Fever, unspecified: Secondary | ICD-10-CM | POA: Diagnosis not present

## 2021-06-15 DIAGNOSIS — R402242 Coma scale, best verbal response, confused conversation, at arrival to emergency department: Secondary | ICD-10-CM | POA: Diagnosis present

## 2021-06-15 DIAGNOSIS — Z09 Encounter for follow-up examination after completed treatment for conditions other than malignant neoplasm: Secondary | ICD-10-CM

## 2021-06-15 DIAGNOSIS — Z4659 Encounter for fitting and adjustment of other gastrointestinal appliance and device: Secondary | ICD-10-CM

## 2021-06-15 HISTORY — DX: Malignant neoplasm of unspecified kidney, except renal pelvis: C64.9

## 2021-06-15 HISTORY — DX: Other pulmonary embolism without acute cor pulmonale: I26.99

## 2021-06-15 HISTORY — DX: Cerebral infarction, unspecified: I63.9

## 2021-06-15 HISTORY — DX: Thrombocytosis, unspecified: D75.839

## 2021-06-15 HISTORY — DX: Essential (primary) hypertension: I10

## 2021-06-15 HISTORY — DX: Chronic obstructive pulmonary disease, unspecified: J44.9

## 2021-06-15 LAB — I-STAT CHEM 8, ED
BUN: 15 mg/dL (ref 8–23)
Calcium, Ion: 1.06 mmol/L — ABNORMAL LOW (ref 1.15–1.40)
Chloride: 105 mmol/L (ref 98–111)
Creatinine, Ser: 1.1 mg/dL (ref 0.61–1.24)
Glucose, Bld: 89 mg/dL (ref 70–99)
HCT: 48 % (ref 39.0–52.0)
Hemoglobin: 16.3 g/dL (ref 13.0–17.0)
Potassium: 4.2 mmol/L (ref 3.5–5.1)
Sodium: 142 mmol/L (ref 135–145)
TCO2: 26 mmol/L (ref 22–32)

## 2021-06-15 LAB — CBC
HCT: 48.4 % (ref 39.0–52.0)
Hemoglobin: 15.6 g/dL (ref 13.0–17.0)
MCH: 32.6 pg (ref 26.0–34.0)
MCHC: 32.2 g/dL (ref 30.0–36.0)
MCV: 101.3 fL — ABNORMAL HIGH (ref 80.0–100.0)
Platelets: 151 10*3/uL (ref 150–400)
RBC: 4.78 MIL/uL (ref 4.22–5.81)
RDW: 11.7 % (ref 11.5–15.5)
WBC: 10.7 10*3/uL — ABNORMAL HIGH (ref 4.0–10.5)
nRBC: 0 % (ref 0.0–0.2)

## 2021-06-15 LAB — PROTIME-INR
INR: 1 (ref 0.8–1.2)
Prothrombin Time: 13.2 seconds (ref 11.4–15.2)

## 2021-06-15 LAB — COMPREHENSIVE METABOLIC PANEL
ALT: 37 U/L (ref 0–44)
AST: 38 U/L (ref 15–41)
Albumin: 3.6 g/dL (ref 3.5–5.0)
Alkaline Phosphatase: 52 U/L (ref 38–126)
Anion gap: 8 (ref 5–15)
BUN: 13 mg/dL (ref 8–23)
CO2: 25 mmol/L (ref 22–32)
Calcium: 8.5 mg/dL — ABNORMAL LOW (ref 8.9–10.3)
Chloride: 107 mmol/L (ref 98–111)
Creatinine, Ser: 0.87 mg/dL (ref 0.61–1.24)
GFR, Estimated: >60 mL/min (ref >60)
Glucose, Bld: 94 mg/dL (ref 70–99)
Potassium: 4.1 mmol/L (ref 3.5–5.1)
Sodium: 140 mmol/L (ref 135–145)
Total Bilirubin: 0.7 mg/dL (ref 0.3–1.2)
Total Protein: 6.5 g/dL (ref 6.5–8.1)

## 2021-06-15 LAB — LACTIC ACID, PLASMA: Lactic Acid, Venous: 2.1 mmol/L (ref 0.5–1.9)

## 2021-06-15 LAB — ETHANOL: Alcohol, Ethyl (B): 186 mg/dL — ABNORMAL HIGH (ref <10)

## 2021-06-15 MED ORDER — PROCHLORPERAZINE EDISYLATE 10 MG/2ML IJ SOLN: 10.0000 mg | INTRAMUSCULAR | Status: DC | PRN

## 2021-06-15 MED ORDER — ONDANSETRON HCL 4 MG/2ML IJ SOLN
4.0000 mg | Freq: Four times a day (QID) | INTRAMUSCULAR | Status: DC | PRN
  Administered 2021-06-16 – 2021-07-13 (×2): 4 mg via INTRAVENOUS
  Filled 2021-06-15 (×2): qty 2

## 2021-06-15 MED ORDER — THIAMINE HCL 100 MG/ML IJ SOLN
100.0000 mg | Freq: Every day | INTRAMUSCULAR | Status: DC
  Administered 2021-06-16 – 2021-06-19 (×4): 100 mg via INTRAVENOUS
  Filled 2021-06-15 (×4): qty 2

## 2021-06-15 MED ORDER — GABAPENTIN 300 MG PO CAPS
300.0000 mg | ORAL_CAPSULE | Freq: Three times a day (TID) | ORAL | Status: DC
  Filled 2021-06-15: qty 1

## 2021-06-15 MED ORDER — THIAMINE HCL 100 MG PO TABS
100.0000 mg | ORAL_TABLET | Freq: Every day | ORAL | Status: DC
  Administered 2021-06-20 – 2021-06-23 (×4): 100 mg via ORAL
  Filled 2021-06-15 (×7): qty 1

## 2021-06-15 MED ORDER — FOLIC ACID 1 MG PO TABS
1.0000 mg | ORAL_TABLET | Freq: Every day | ORAL | Status: DC
  Administered 2021-06-20 – 2021-06-23 (×4): 1 mg via ORAL
  Filled 2021-06-15 (×5): qty 1

## 2021-06-15 MED ORDER — LACTATED RINGERS IV SOLN: INTRAVENOUS | Status: DC

## 2021-06-15 MED ORDER — LORAZEPAM 1 MG PO TABS
1.0000 mg | ORAL_TABLET | ORAL | Status: AC | PRN
  Filled 2021-06-15: qty 1

## 2021-06-15 MED ORDER — HYDROMORPHONE HCL 1 MG/ML IJ SOLN
0.5000 mg | INTRAMUSCULAR | Status: DC | PRN
  Administered 2021-06-16 – 2021-06-17 (×7): 0.5 mg via INTRAVENOUS
  Filled 2021-06-15 (×7): qty 1

## 2021-06-15 MED ORDER — IOHEXOL 350 MG/ML SOLN
100.0000 mL | Freq: Once | INTRAVENOUS | Status: AC | PRN
  Administered 2021-06-15: 100 mL via INTRAVENOUS

## 2021-06-15 MED ORDER — SIMETHICONE 80 MG PO CHEW: 80.0000 mg | CHEWABLE_TABLET | Freq: Four times a day (QID) | ORAL | Status: DC | PRN

## 2021-06-15 MED ORDER — DOCUSATE SODIUM 100 MG PO CAPS
100.0000 mg | ORAL_CAPSULE | Freq: Two times a day (BID) | ORAL | Status: DC
Start: 2021-06-15 — End: 2021-06-19
  Filled 2021-06-15: qty 1

## 2021-06-15 MED ORDER — OXYCODONE HCL 5 MG PO TABS
10.0000 mg | ORAL_TABLET | ORAL | Status: DC | PRN
  Filled 2021-06-15: qty 2

## 2021-06-15 MED ORDER — OXYCODONE HCL 5 MG PO TABS: 5.0000 mg | ORAL_TABLET | ORAL | Status: DC | PRN

## 2021-06-15 MED ORDER — LORAZEPAM 2 MG/ML IJ SOLN
1.0000 mg | INTRAMUSCULAR | Status: AC | PRN
  Administered 2021-06-16 (×2): 2 mg via INTRAVENOUS
  Administered 2021-06-16 – 2021-06-17 (×4): 1 mg via INTRAVENOUS
  Administered 2021-06-17: 3 mg via INTRAVENOUS
  Administered 2021-06-17 – 2021-06-18 (×2): 2 mg via INTRAVENOUS
  Administered 2021-06-18: 4 mg via INTRAVENOUS
  Administered 2021-06-18 (×2): 2 mg via INTRAVENOUS
  Administered 2021-06-19: 3 mg via INTRAVENOUS
  Administered 2021-06-19 – 2021-06-20 (×2): 2 mg via INTRAVENOUS
  Filled 2021-06-15 (×2): qty 1
  Filled 2021-06-15: qty 2
  Filled 2021-06-15 (×6): qty 1
  Filled 2021-06-15 (×2): qty 2
  Filled 2021-06-15 (×4): qty 1

## 2021-06-15 MED ORDER — ADULT MULTIVITAMIN W/MINERALS CH
1.0000 | ORAL_TABLET | Freq: Every day | ORAL | Status: DC
  Administered 2021-06-20 – 2021-06-23 (×4): 1 via ORAL
  Filled 2021-06-15 (×5): qty 1

## 2021-06-15 MED ORDER — FLEET ENEMA 7-19 GM/118ML RE ENEM: 1.0000 | ENEMA | Freq: Every day | RECTAL | Status: DC | PRN

## 2021-06-15 MED ORDER — METHOCARBAMOL 1000 MG/10ML IJ SOLN
500.0000 mg | Freq: Four times a day (QID) | INTRAVENOUS | Status: DC | PRN
  Filled 2021-06-15: qty 5

## 2021-06-15 MED ORDER — HALOPERIDOL LACTATE 5 MG/ML IJ SOLN
5.0000 mg | Freq: Once | INTRAMUSCULAR | Status: AC
  Administered 2021-06-15: 5 mg via INTRAVENOUS

## 2021-06-15 MED ORDER — MIDAZOLAM HCL 2 MG/2ML IJ SOLN: 5.0000 mg | Freq: Once | INTRAMUSCULAR | Status: AC

## 2021-06-15 MED ORDER — ACETAMINOPHEN 325 MG PO TABS
650.0000 mg | ORAL_TABLET | Freq: Four times a day (QID) | ORAL | Status: DC
Start: 2021-06-16 — End: 2021-06-19

## 2021-06-15 MED ORDER — MIDAZOLAM HCL 2 MG/2ML IJ SOLN
INTRAMUSCULAR | Status: AC
  Administered 2021-06-15: 5 mg via INTRAVENOUS
  Filled 2021-06-15: qty 6
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
## 2021-06-15 NOTE — H&P (Signed)
Admitting Physician: Nickola Major   Service: Trauma Surgery  CC: found down  Subjective   Mechanism of Injury: Danyelle Ditta. is an 60 y.o. male who presented as a level 1 trauma after being found down.  He is argumentative and drunk and does not provide a history.  He has blood coming out of his left ear and was reported to have a GCS of 5 so a trauma alert was activated.  On arrival he is moving everything, shouting at [place] to leave him alone and has blood coming out of his left ear.  He refuses to provide any history below.  He required sedation in the trauma bay due to being uncooperative and in danger of pulling out lines/not cooperating with scans.  History reviewed. No pertinent past medical history.  No past surgical history.  No family history on file.  Social:  has no history on file for tobacco use, alcohol use, and drug use.  Allergies: No Known Allergies  Medications: No current outpatient medications  Objective   Primary Survey: Blood pressure 96/62, pulse 78, temperature (!) 97.5 F (36.4 C), resp. rate 18, height '5\' 9"'$  (1.753 m), weight 72.6 kg, SpO2 99 %. Airway: Patent, protecting airway Breathing: Bilateral breath sounds, breathing spontaneously Circulation: Stable, Palpable peripheral pulses Disability: Moving all extremities,   GCS Eyes: 4 - Eyes open spontaneously  GCS Verbal: 4 - Confused conversation, but able to answer questions  GCS Motor: 6 - Obeys commands for movement  GCS 14  Environment/Exposure: Warm, dry  Primary Survey Adjuncts:  CXR - See results below PXR - See results below  Secondary Survey: Head:  blood draining from left ear Neck: Full range of motion without pain, no midline tenderness Chest: Bilateral breath sounds, chest wall stable Abdomen: Soft, non-tender, non-distended Upper Extremities: Strength and sensation intact, palpable peripheral pulses Lower extremities: Strength and sensation intact, palpable peripheral  pulses Back: No step offs or deformities, atraumatic Rectal:  deferred Psych: Normal mood and affect  Results for orders placed or performed during the hospital encounter of 06/15/21 (from the past 24 hour(s))  CBC     Status: Abnormal   Collection Time: 06/15/21 10:40 PM  Result Value Ref Range   WBC 10.7 (H) 4.0 - 10.5 K/uL   RBC 4.78 4.22 - 5.81 MIL/uL   Hemoglobin 15.6 13.0 - 17.0 g/dL   HCT 48.4 39.0 - 52.0 %   MCV 101.3 (H) 80.0 - 100.0 fL   MCH 32.6 26.0 - 34.0 pg   MCHC 32.2 30.0 - 36.0 g/dL   RDW 11.7 11.5 - 15.5 %   Platelets 151 150 - 400 K/uL   nRBC 0.0 0.0 - 0.2 %  Ethanol     Status: Abnormal   Collection Time: 06/15/21 10:40 PM  Result Value Ref Range   Alcohol, Ethyl (B) 186 (H) <10 mg/dL  Protime-INR     Status: None   Collection Time: 06/15/21 10:40 PM  Result Value Ref Range   Prothrombin Time 13.2 11.4 - 15.2 seconds   INR 1.0 0.8 - 1.2  I-Stat Chem 8, ED     Status: Abnormal   Collection Time: 06/15/21 10:51 PM  Result Value Ref Range   Sodium 142 135 - 145 mmol/L   Potassium 4.2 3.5 - 5.1 mmol/L   Chloride 105 98 - 111 mmol/L   BUN 15 8 - 23 mg/dL   Creatinine, Ser 1.10 0.61 - 1.24 mg/dL   Glucose, Bld 89 70 -  99 mg/dL   Calcium, Ion 1.06 (L) 1.15 - 1.40 mmol/L   TCO2 26 22 - 32 mmol/L   Hemoglobin 16.3 13.0 - 17.0 g/dL   HCT 48.0 39.0 - 52.0 %    Imaging Orders         CT HEAD WO CONTRAST    1. Left temporal subarachnoid hemorrhage in the sulci. Left posterior ventricular horn intraventricular hemorrhage. Surface hemorrhage along the right frontal region likely representing parenchymal contusion. Intracranial gas bilaterally, likely subdural. 2. Nondepressed fracture of the left temporal bone. Fractures of the skull base involving the left sphenoid bone and left maxillary antral walls. See additional report of CT maxillofacial. 3. Left middle ear and mastoid fluid, likely posttraumatic.       CT CERVICAL SPINE WO CONTRAST    1. Normal  alignment of the cervical spine. No acute displaced cervical spine fractures identified. 2. Moderate degenerative changes in the cervical spine. 3. Skull base fractures involving the left temporal bone and left sphenoid bone. Associated soft tissue and subdural emphysema. Fluid in the left middle ear and left mastoid air cells is likely posttraumatic.       CT MAXILLOFACIAL WO CONTRAST    1. Acute nondisplaced fractures demonstrated in the left anterior and lateral maxillary antral walls, left sphenoid bone, left temporal bone. 2. Old fracture deformities suggested in the nasal bones and inferior left orbital rim with plate and screw fixation of the left anterior maxillary antral wall. 3. Soft tissue emphysema demonstrated in the extraconal left orbit, left temporal and masseter soft tissues, and bilateral subdural spaces consistent with posttraumatic change. 4. Fluid in the left maxillary antrum, sphenoid sinus, left middle ear, and left mastoid air cells is likely posttraumatic.       CT CHEST W CONTRAST         CT ABDOMEN PELVIS W CONTRAST    1. No acute posttraumatic changes demonstrated in the chest. No evidence of mediastinal injury or pulmonary parenchymal injury. 2. No acute posttraumatic changes demonstrated in the abdomen or pelvis. No evidence of solid organ injury or bowel perforation. 3. Incidental findings including aortic atherosclerosis, emphysematous changes in the lungs, fatty infiltration of the liver, cholelithiasis, chronic scarring in the left kidney.  Assessment and Plan   Jihad Duffie. is an 60 y.o. male who presented as a level 1 trauma after being found down with blood draining from left ear.  Injuries: Intracranial injuries: Subarachnoid hemorrhage, intraventricular hemorrhage, parenchymal contusion - neurosurgery consulted Skull fracture - neurosurgery consulted Nondisplaced facial fractures - unable to get reliable exam, some old fractures in this  area as well, discuss with plastics in the morning Intoxication - CIWA  Consults:  I called Neurosurgery at [phone] and asked for a routine consult due to injury pattern. Dr. Kathyrn Sheriff is currently operating.   FEN - NPO, IVF VTE - Sequential Compression Devices, lovenox held for now ID - None  Dispo - Step-down unit    Felicie Morn, MD  [facility] Use [email or web address] to contact on call provider

## 2021-06-15 NOTE — ED Notes (Signed)
Unable to obtain manual bp; pt combative, yelling, fighting staff

## 2021-06-16 ENCOUNTER — Encounter (HOSPITAL_COMMUNITY): Payer: Self-pay

## 2021-06-16 ENCOUNTER — Inpatient Hospital Stay (HOSPITAL_COMMUNITY): Payer: Medicare Other

## 2021-06-16 ENCOUNTER — Other Ambulatory Visit: Payer: Self-pay

## 2021-06-16 LAB — CBC
HCT: 46.5 % (ref 39.0–52.0)
Hemoglobin: 15.4 g/dL (ref 13.0–17.0)
MCH: 33.1 pg (ref 26.0–34.0)
MCHC: 33.1 g/dL (ref 30.0–36.0)
MCV: 100 fL (ref 80.0–100.0)
Platelets: 165 10*3/uL (ref 150–400)
RBC: 4.65 MIL/uL (ref 4.22–5.81)
RDW: 11.9 % (ref 11.5–15.5)
WBC: 10.4 10*3/uL (ref 4.0–10.5)
nRBC: 0 % (ref 0.0–0.2)

## 2021-06-16 LAB — BASIC METABOLIC PANEL
Anion gap: 11 (ref 5–15)
BUN: 8 mg/dL (ref 8–23)
CO2: 22 mmol/L (ref 22–32)
Calcium: 8.5 mg/dL — ABNORMAL LOW (ref 8.9–10.3)
Chloride: 106 mmol/L (ref 98–111)
Creatinine, Ser: 0.74 mg/dL (ref 0.61–1.24)
GFR, Estimated: >60 mL/min (ref >60)
Glucose, Bld: 85 mg/dL (ref 70–99)
Potassium: 4 mmol/L (ref 3.5–5.1)
Sodium: 139 mmol/L (ref 135–145)

## 2021-06-16 LAB — CBG MONITORING, ED: Glucose-Capillary: 96 mg/dL (ref 70–99)

## 2021-06-16 LAB — RESP PANEL BY RT-PCR (FLU A&B, COVID) ARPGX2
Influenza A by PCR: NEGATIVE
Influenza B by PCR: NEGATIVE
SARS Coronavirus 2 by RT PCR: NEGATIVE

## 2021-06-16 LAB — URINALYSIS, ROUTINE W REFLEX MICROSCOPIC
Bilirubin Urine: NEGATIVE
Glucose, UA: NEGATIVE mg/dL
Hgb urine dipstick: NEGATIVE
Ketones, ur: NEGATIVE mg/dL
Leukocytes,Ua: NEGATIVE
Nitrite: NEGATIVE
Protein, ur: NEGATIVE mg/dL
Specific Gravity, Urine: 1.01 (ref 1.005–1.030)
pH: 5 (ref 5.0–8.0)

## 2021-06-16 LAB — SAMPLE TO BLOOD BANK

## 2021-06-16 LAB — HIV ANTIBODY (ROUTINE TESTING W REFLEX): HIV Screen 4th Generation wRfx: NONREACTIVE

## 2021-06-16 MED ORDER — LEVETIRACETAM IN NACL 1000 MG/100ML IV SOLN
1000.0000 mg | Freq: Two times a day (BID) | INTRAVENOUS | Status: DC
Start: 2021-06-16 — End: 2021-06-16

## 2021-06-16 MED ORDER — LEVETIRACETAM IN NACL 500 MG/100ML IV SOLN
500.0000 mg | Freq: Two times a day (BID) | INTRAVENOUS | Status: AC
  Administered 2021-06-17 – 2021-06-23 (×13): 500 mg via INTRAVENOUS
  Filled 2021-06-16 (×14): qty 100

## 2021-06-16 NOTE — Progress Notes (Addendum)
Subjective/Chief Complaint: No acute change since admission.   Objective: Vital signs in last 24 hours: Temp:  [97.5 F (36.4 C)] 97.5 F (36.4 C) (09/17 2240) Pulse Rate:  [73-99] 86 (09/18 0800) Resp:  [14-26] 14 (09/18 0800) BP: (96-155)/(62-87) 152/80 (09/18 0800) SpO2:  [95 %-100 %] 95 % (09/18 0800) Weight:  [72.6 kg] 72.6 kg (09/17 2257)    Intake/Output from previous day: No intake/output data recorded. Intake/Output this shift: No intake/output data recorded.  Patient is somnolent Unlabored respiration Abdomen soft and nontender Extremities warm well perfused  Lab Results:  Recent Labs    06/15/21 2240 06/15/21 2251 06/16/21 0420  WBC 10.7*  --  10.4  HGB 15.6 16.3 15.4  HCT 48.4 48.0 46.5  PLT 151  --  165   BMET Recent Labs    06/15/21 2240 06/15/21 2251 06/16/21 0420  NA 140 142 139  K 4.1 4.2 4.0  CL 107 105 106  CO2 25  --  22  GLUCOSE 94 89 85  BUN '13 15 8  '$ CREATININE 0.87 1.10 0.74  CALCIUM 8.5*  --  8.5*   PT/INR Recent Labs    06/15/21 2240  LABPROT 13.2  INR 1.0   ABG No results for input(s): PHART, HCO3 in the last 72 hours.  Invalid input(s): PCO2, PO2  Studies/Results: CT HEAD WO CONTRAST  Result Date: 06/15/2021 CLINICAL DATA:  Poly trauma. EXAM: CT HEAD WITHOUT CONTRAST TECHNIQUE: Contiguous axial images were obtained from the base of the skull through the vertex without intravenous contrast. COMPARISON:  None. FINDINGS: Brain: Subarachnoid hemorrhage in the inferior aspect of the left temporal lobe in the temporal sulci. Small amount of intraventricular hemorrhage in the left posterior horn. Suggestion of surface hemorrhage along the right frontal region likely representing contusion. Intracranial gas in the temporal regions bilaterally, likely subdural. No mass effect or midline shift. No ventricular dilatation. Gray-white matter junctions are distinct. Basal cisterns are not effaced. Vascular: Intracranial arterial  vascular calcifications. Skull: Nondepressed fracture along the left posterior temporal region. Skull base fractures involving the left sphenoid bone and maxillary antral walls. Sinuses/Orbits: See CT maxillofacial. Other: Subcutaneous emphysema along the left temporal region and masseter space. IMPRESSION: 1. Left temporal subarachnoid hemorrhage in the sulci. Left posterior ventricular horn intraventricular hemorrhage. Surface hemorrhage along the right frontal region likely representing parenchymal contusion. Intracranial gas bilaterally, likely subdural. 2. Nondepressed fracture of the left temporal bone. Fractures of the skull base involving the left sphenoid bone and left maxillary antral walls. See additional report of CT maxillofacial. 3. Left middle ear and mastoid fluid, likely posttraumatic. Critical Value/emergent results were called by telephone at the time of interpretation on 06/15/2021 at 11:13 pm to provider Dr. Thermon Leyland, who verbally acknowledged these results. Electronically Signed   By: Lucienne Capers M.D.   On: 06/15/2021 23:20   CT CHEST W CONTRAST  Result Date: 06/15/2021 CLINICAL DATA:  Poly trauma, critical, head and C-spine injury suspected. Level 1 trauma. EXAM: CT CHEST, ABDOMEN, AND PELVIS WITH CONTRAST TECHNIQUE: Multidetector CT imaging of the chest, abdomen and pelvis was performed following the standard protocol during bolus administration of intravenous contrast. CONTRAST:  180m OMNIPAQUE IOHEXOL 350 MG/ML SOLN COMPARISON:  None. FINDINGS: CT CHEST FINDINGS Cardiovascular: No significant vascular findings. Normal heart size. No pericardial effusion. Mediastinum/Nodes: Esophagus is decompressed. Moderately prominent lymph nodes in the mediastinum without pathologic enlargement. Largest nodes in the left aortopulmonic window measure about 12 mm in short axis dimension. Thyroid gland is unremarkable.  Lungs/Pleura: Emphysematous changes in the lungs with scarring in the lung  apices and periphery. Mild dependent changes in the lung bases. No focal consolidation. No pleural effusions. No pneumothorax. Airways are patent. Musculoskeletal: Degenerative changes in the spine. No vertebral compression deformities. Pectus carina configuration of the sternum. No acute depressed sternal fractures. Visualized ribs are nondepressed. Visualized portions of the shoulders and clavicles are unremarkable except for degenerative change. CT ABDOMEN PELVIS FINDINGS Hepatobiliary: Diffuse fatty infiltration of the liver. Portal veins are patent. No focal lesions. Small stones in the gallbladder. No gallbladder wall thickening or edema. No bile duct dilatation. Pancreas: Unremarkable. No pancreatic ductal dilatation or surrounding inflammatory changes. Spleen: No splenic injury or perisplenic hematoma. Adrenals/Urinary Tract: No adrenal gland nodules. Chronic appearing scarring in the upper pole of the left kidney. Renal nephrograms are otherwise homogeneous. No hydronephrosis or hydroureter. Bladder is unremarkable. Stomach/Bowel: Stomach, small bowel, and colon are not abnormally distended. No wall thickening or inflammatory changes. Scattered diverticula without evidence of diverticulitis. Appendix is normal. No mesenteric hematoma. Vascular/Lymphatic: Aortic atherosclerosis. No enlarged abdominal or pelvic lymph nodes. Reproductive: Prostate gland is enlarged. Other: No free air or free fluid in the abdomen. Abdominal wall musculature appears intact. Musculoskeletal: Degenerative changes in the spine. No vertebral compression deformities. Sacrum, pelvis, and hips appear intact. IMPRESSION: 1. No acute posttraumatic changes demonstrated in the chest. No evidence of mediastinal injury or pulmonary parenchymal injury. 2. No acute posttraumatic changes demonstrated in the abdomen or pelvis. No evidence of solid organ injury or bowel perforation. 3. Incidental findings including aortic atherosclerosis,  emphysematous changes in the lungs, fatty infiltration of the liver, cholelithiasis, chronic scarring in the left kidney. Critical Value/emergent results were called by telephone at the time of interpretation on 06/15/2021 at 11:15 pm to provider Dr. Thermon Leyland, who verbally acknowledged these results. Electronically Signed   By: Lucienne Capers M.D.   On: 06/15/2021 23:41   CT CERVICAL SPINE WO CONTRAST  Result Date: 06/15/2021 CLINICAL DATA:  Poly trauma, critical, head and C-spine injury suspected. Trauma level 1. EXAM: CT CERVICAL SPINE WITHOUT CONTRAST TECHNIQUE: Multidetector CT imaging of the cervical spine was performed without intravenous contrast. Multiplanar CT image reconstructions were also generated. COMPARISON:  None. FINDINGS: Alignment: Motion artifact limits examination.  Normal alignment. Skull base and vertebrae: Non depressed fractures of the left temporal bone focally extending to the base of the left parietal bone. Fractures of the greater and lesser wing of the left sphenoid bone. Associated soft tissue and subdural emphysema. Fluid in the left middle ear and some of the left mastoid air cells is likely posttraumatic. See additional report of CT maxillofacial bones. No vertebral compression deformities. No focal bone lesion or bone destruction. C1-2 articulation appears intact. Soft tissues and spinal canal: No prevertebral fluid or swelling. No visible canal hematoma. Disc levels: Degenerative changes with disc space narrowing and endplate osteophyte formation most prominent at C5-6 and C6-7 levels. Upper chest: Mild emphysematous changes in the lung apices. Other: None. IMPRESSION: 1. Normal alignment of the cervical spine. No acute displaced cervical spine fractures identified. 2. Moderate degenerative changes in the cervical spine. 3. Skull base fractures involving the left temporal bone and left sphenoid bone. Associated soft tissue and subdural emphysema. Fluid in the left middle  ear and left mastoid air cells is likely posttraumatic. Critical Value/emergent results were called by telephone at the time of interpretation on 06/15/2021 at 11:13 pm to provider Dr. Thermon Leyland, who verbally acknowledged these results. Electronically Signed  By: Lucienne Capers M.D.   On: 06/15/2021 23:25   CT ABDOMEN PELVIS W CONTRAST  Result Date: 06/15/2021 CLINICAL DATA:  Poly trauma, critical, head and C-spine injury suspected. Level 1 trauma. EXAM: CT CHEST, ABDOMEN, AND PELVIS WITH CONTRAST TECHNIQUE: Multidetector CT imaging of the chest, abdomen and pelvis was performed following the standard protocol during bolus administration of intravenous contrast. CONTRAST:  175m OMNIPAQUE IOHEXOL 350 MG/ML SOLN COMPARISON:  None. FINDINGS: CT CHEST FINDINGS Cardiovascular: No significant vascular findings. Normal heart size. No pericardial effusion. Mediastinum/Nodes: Esophagus is decompressed. Moderately prominent lymph nodes in the mediastinum without pathologic enlargement. Largest nodes in the left aortopulmonic window measure about 12 mm in short axis dimension. Thyroid gland is unremarkable. Lungs/Pleura: Emphysematous changes in the lungs with scarring in the lung apices and periphery. Mild dependent changes in the lung bases. No focal consolidation. No pleural effusions. No pneumothorax. Airways are patent. Musculoskeletal: Degenerative changes in the spine. No vertebral compression deformities. Pectus carina configuration of the sternum. No acute depressed sternal fractures. Visualized ribs are nondepressed. Visualized portions of the shoulders and clavicles are unremarkable except for degenerative change. CT ABDOMEN PELVIS FINDINGS Hepatobiliary: Diffuse fatty infiltration of the liver. Portal veins are patent. No focal lesions. Small stones in the gallbladder. No gallbladder wall thickening or edema. No bile duct dilatation. Pancreas: Unremarkable. No pancreatic ductal dilatation or surrounding  inflammatory changes. Spleen: No splenic injury or perisplenic hematoma. Adrenals/Urinary Tract: No adrenal gland nodules. Chronic appearing scarring in the upper pole of the left kidney. Renal nephrograms are otherwise homogeneous. No hydronephrosis or hydroureter. Bladder is unremarkable. Stomach/Bowel: Stomach, small bowel, and colon are not abnormally distended. No wall thickening or inflammatory changes. Scattered diverticula without evidence of diverticulitis. Appendix is normal. No mesenteric hematoma. Vascular/Lymphatic: Aortic atherosclerosis. No enlarged abdominal or pelvic lymph nodes. Reproductive: Prostate gland is enlarged. Other: No free air or free fluid in the abdomen. Abdominal wall musculature appears intact. Musculoskeletal: Degenerative changes in the spine. No vertebral compression deformities. Sacrum, pelvis, and hips appear intact. IMPRESSION: 1. No acute posttraumatic changes demonstrated in the chest. No evidence of mediastinal injury or pulmonary parenchymal injury. 2. No acute posttraumatic changes demonstrated in the abdomen or pelvis. No evidence of solid organ injury or bowel perforation. 3. Incidental findings including aortic atherosclerosis, emphysematous changes in the lungs, fatty infiltration of the liver, cholelithiasis, chronic scarring in the left kidney. Critical Value/emergent results were called by telephone at the time of interpretation on 06/15/2021 at 11:15 pm to provider Dr. SThermon Leyland who verbally acknowledged these results. Electronically Signed   By: WLucienne CapersM.D.   On: 06/15/2021 23:41   CT MAXILLOFACIAL WO CONTRAST  Result Date: 06/15/2021 CLINICAL DATA:  Poly trauma, critical, head and C-spine injury suspected. Level 1 trauma. EXAM: CT MAXILLOFACIAL WITHOUT CONTRAST TECHNIQUE: Multidetector CT imaging of the maxillofacial structures was performed. Multiplanar CT image reconstructions were also generated. COMPARISON:  None. FINDINGS: Osseous: Old  posttraumatic changes are present. Old nasal bone fracture deformities. Plate and screw fixation of the left anterior maxillary wall. There is depression of the left inferior orbital rim which appears to be old. Acute mildly depressed fractures are suggested in the anterior and lateral left maxillary antral walls. Acute nondisplaced fractures demonstrated in the left sphenoid bone involving greater and lesser wing, and in the left temporal bone. There is associated soft tissue emphysema in the postseptal extraconal left orbit, in the masseter spaces and sub zygomatic arch region, and in the left temporal scalp region. Subdural  gas is also demonstrated in the temporal regions bilaterally but greater on the left. Orbits: Extraconal soft tissue air demonstrated in the left orbit as above. Globes and extraocular muscles are otherwise symmetrical in appearance. No significant displacement. Sinuses: Opacification of the left maxillary antrum and of the sphenoid sinus with fluid demonstrated in the left middle ear and some of the left mastoid air cells, likely posttraumatic. Mucosal thickening of some of the ethmoid air cells and right maxillary antrum, likely inflammatory. Soft tissues: Soft tissue infiltration and soft tissue edema on the left as above. IMPRESSION: 1. Acute nondisplaced fractures demonstrated in the left anterior and lateral maxillary antral walls, left sphenoid bone, left temporal bone. 2. Old fracture deformities suggested in the nasal bones and inferior left orbital rim with plate and screw fixation of the left anterior maxillary antral wall. 3. Soft tissue emphysema demonstrated in the extraconal left orbit, left temporal and masseter soft tissues, and bilateral subdural spaces consistent with posttraumatic change. 4. Fluid in the left maxillary antrum, sphenoid sinus, left middle ear, and left mastoid air cells is likely posttraumatic. Critical Value/emergent results were called by telephone at the  time of interpretation on 06/15/2021 at 11:15 pm to provider Dr. Thermon Leyland, who verbally acknowledged these results. Electronically Signed   By: Lucienne Capers M.D.   On: 06/15/2021 23:33    Anti-infectives: Anti-infectives (From admission, onward)    None       Assessment/Plan: Randall Harvey. is an 60 y.o. male who presented as a level 1 trauma after being found down with blood draining from left ear 9/17.   Injuries: Intracranial injuries: Subarachnoid hemorrhage, intraventricular hemorrhage, parenchymal contusion - neurosurgery consulted, awaiting recs.  We will go ahead and repeat CT head. Skull fracture - neurosurgery consulted Nondisplaced facial fractures - unable to get reliable exam, some old fractures in this area as well, d/w Dr Claudia Desanctis- nonop based on scans, will examine when patient can cooperate more with exam Intoxication - CIWA   .     FEN - NPO, IVF VTE - Sequential Compression Devices, lovenox held for now ID - None   Dispo - Step-down unit   LOS: 1 day    Clovis Riley 06/16/2021

## 2021-06-16 NOTE — ED Notes (Signed)
Attempted to call pt's significant other that was here to confirm patient's relatives. There was no answer for cell phone number listed and it did not have a VM. House number is incorrect.

## 2021-06-16 NOTE — Consult Note (Signed)
  Chief Complaint   Chief Complaint  Patient presents with   Trauma    History of Present Illness  Randall Hornbostel. is a 60 y.o. male admitted yesterday after being found down.  Patient has been agitated and uncooperative, and was recently given sedative.  I therefore unable to obtain history directly from him.  Per discussion with trauma personnel and ED personnel, as well as review of the EMR apparently patient upon arrival was intoxicated, moving all extremities well, shouting at staff.  He was noted to have bleeding from his left ear.  Past Medical History   Past Medical History:  Diagnosis Date   COPD (chronic obstructive pulmonary disease) ([place])    Hypertension    Pulmonary embolus with infarction ([place])    Renal cell carcinoma ([facility])    Stroke ([place])    Thrombocythemia     Past Surgical History   Past Surgical History:  Procedure Laterality Date   ANKLE SURGERY     ORBITAL FRACTURE SURGERY Left     Social History   Social History   Tobacco Use   Smoking status: Every Day    Types: Cigarettes   Smokeless tobacco: Never  Substance Use Topics   Alcohol use: Yes   Drug use: Yes    Types: Marijuana    Medications   Prior to Admission medications   Not on File    Allergies  No Known Allergies  Review of Systems  ROS  Neurologic Exam  Currently somnolent but arouses with stimulation Not verbally responsive currently Pupils reactive Localizes briskly to pain BUE, w/d BLE Not following commands currently  Imaging  CT head and cervical spine were both personally reviewed.  CT of the head yesterday and this a.m. demonstrates expected minimal evolution of bilateral parietal convexity subarachnoid hemorrhage and stable appearance of very small right frontal subdural hematoma.  There is also likely tentorial subdural.  Minimal ventricular hemorrhage without hydrocephalus.  There is also a left temporal bone fracture with fluid within the left mastoid.  CT of the  cervical spine was also reviewed and there is motion artifact but does not demonstrate any acute fracture or subluxation.  Impression  - 60 y.o. male intoxicated and found down, appears to be neurologically intact, with CT demonstrating small traumatic intracranial hemorrhage and left temporal bone/mastoid fracture.  Unclear whether bleeding from the left ear includes some CSF.  Plan  -Would continue to monitor neurologic exam in stepdown level of care -Keppra 500 mg p.o. twice daily x7 days -Continue to monitor ear drainage.  Consuella Lose, MD [facility]

## 2021-06-16 NOTE — ED Notes (Signed)
Pt pulling blood pressure cuff off, attempting to pull on IV lines. Agitated and cursing at staff; disoriented x4.

## 2021-06-16 NOTE — ED Notes (Signed)
Pt removed end tidal, continues to pull cannula off

## 2021-06-16 NOTE — ED Notes (Signed)
Pulling wires off restless

## 2021-06-16 NOTE — ED Notes (Signed)
Pt trying to sit up, pulling at c-collar. Redirectable to stay in bed. C-collar removed, ok'ed by Dr.Stechschulte. Pt not answering questions, following some commands

## 2021-06-16 NOTE — ED Notes (Signed)
Grayland Ormond, son, [phone] would like an update when available

## 2021-06-16 NOTE — ED Notes (Signed)
Pts oxygen saturation noted to be 88% on room air. This RN attempted to place pt on nasal canula, but pt immediatly attempted to remove and became agitated. Primary RN notified and oxygen saturation now currently 91% on room air.

## 2021-06-16 NOTE — ED Notes (Signed)
[phone] gf called checking up on pt.

## 2021-06-16 NOTE — Consult Note (Signed)
Consulted for patient's facial fractures.  Imaging reviewed.  All fractures minimally displaced and will be treated non-operatively.  Will come by to examine patient when he is more alert and able to cooperate.  Call with any questions.

## 2021-06-17 LAB — BASIC METABOLIC PANEL
Anion gap: 11 (ref 5–15)
BUN: 7 mg/dL — ABNORMAL LOW (ref 8–23)
CO2: 23 mmol/L (ref 22–32)
Calcium: 8.4 mg/dL — ABNORMAL LOW (ref 8.9–10.3)
Chloride: 99 mmol/L (ref 98–111)
Creatinine, Ser: 0.76 mg/dL (ref 0.61–1.24)
GFR, Estimated: >60 mL/min (ref >60)
Glucose, Bld: 89 mg/dL (ref 70–99)
Potassium: 3.8 mmol/L (ref 3.5–5.1)
Sodium: 133 mmol/L — ABNORMAL LOW (ref 135–145)

## 2021-06-17 LAB — CBC
HCT: 45.7 % (ref 39.0–52.0)
Hemoglobin: 15.3 g/dL (ref 13.0–17.0)
MCH: 33.2 pg (ref 26.0–34.0)
MCHC: 33.5 g/dL (ref 30.0–36.0)
MCV: 99.1 fL (ref 80.0–100.0)
Platelets: 147 10*3/uL — ABNORMAL LOW (ref 150–400)
RBC: 4.61 MIL/uL (ref 4.22–5.81)
RDW: 11.4 % — ABNORMAL LOW (ref 11.5–15.5)
WBC: 9.6 10*3/uL (ref 4.0–10.5)
nRBC: 0 % (ref 0.0–0.2)

## 2021-06-17 MED ORDER — HALOPERIDOL LACTATE 5 MG/ML IJ SOLN
5.0000 mg | Freq: Once | INTRAMUSCULAR | Status: AC
  Administered 2021-06-17: 5 mg via INTRAVENOUS
  Filled 2021-06-17: qty 1

## 2021-06-17 NOTE — Progress Notes (Signed)
SLP Cancellation Note  Patient Details Name: Randall Harvey. MRN: [id number] DOB: Jan 18, 1961   Cancelled treatment:       Reason Eval/Treat Not Completed: Medical issues which prohibited therapy - consulted with RN regarding SLE orders on pt. RN reports pt is currently not appropriate for evaluation. Will continue efforts.   B. Quentin Ore, [facility], [place] Speech Language Pathologist Office: [phone]  Shonna Chock 06/17/2021, 2:33 PM

## 2021-06-17 NOTE — ED Notes (Signed)
Pt very agitated and is pulling at restraints and moaning loudly in bed. Notified Trauma PA.

## 2021-06-17 NOTE — ED Notes (Signed)
Trauma PA at bedside. 

## 2021-06-17 NOTE — Progress Notes (Signed)
  NEUROSURGERY PROGRESS NOTE   Pt remained agitated overnight. Not following commands. No ear drainage noted by RN.  EXAM:  BP 117/84   Pulse 79   Temp 99.4 F (37.4 C) (Rectal)   Resp 15   Ht '5\' 9"'$  (1.753 m)   Wt 72.6 kg   SpO2 95%   BMI 23.63 kg/m   Somnolent but arouses to pain Verbalizes to pain, not answering questions or following commands CN grossly intact Localizes briskly BUE, w/d BLE No drainage/bleeding from left ear  IMPRESSION:  60 y.o. male found down, remains obtunded but non-focal. Has minor ICH Temporal bone fracture   PLAN: - Cont supportive care per trauma. - Monitor ear drainage, nothing seen overnight.   Consuella Lose, MD [facility]

## 2021-06-17 NOTE — ED Notes (Signed)
Pt had secretions in mouth. Suctioned pt. Will continue to monitor

## 2021-06-17 NOTE — ED Notes (Signed)
Neuro at bedside.

## 2021-06-17 NOTE — Progress Notes (Addendum)
Pt arrived to 4NP03 from ED. Pt somnolent, does not follow commands, withdraws from pain. Vital signs WNL. Pt in bilateral soft wrist restraints. Assessment completed and documented.  Justice Rocher, RN

## 2021-06-17 NOTE — ED Notes (Signed)
Patient becoming increasingly more agitated. Patient is still lethargic and only responding to pain.

## 2021-06-17 NOTE — ED Notes (Signed)
Pt very agitated and thrashing in bed. Pt got out of R wrist restraint. Placed restraint back on pt. Ativan given for CIWA.

## 2021-06-17 NOTE — ED Notes (Signed)
Pt lethargic, but awake enough to continue pulling at lines and monitoring equipment. Pt unable to follow commands and to verbalize understanding of need to not pull at lines and equipment. Notified Trauma PA. Pt being placed in soft restraints for pt safety.

## 2021-06-17 NOTE — ED Notes (Signed)
Randall Harvey (son, emergency/primary contact) [phone] to be updated w/ any information. Pt's son at bedside, updated on current status. His sister [name] to come see pt later d/t son not coming back today.

## 2021-06-17 NOTE — ED Notes (Signed)
Patient repositioned in bed , clean linens applied.

## 2021-06-17 NOTE — Progress Notes (Signed)
Progress Note     Subjective: He is somnolent this am. He does arouse to pain and localizes pain. Son was bedside this morning but left by the time of my evaluation   Objective: Vital signs in last 24 hours: Temp:  [98.6 F (37 C)-99.4 F (37.4 C)] 99.4 F (37.4 C) (09/19 0400) Pulse Rate:  [74-101] 79 (09/19 0700) Resp:  [14-23] 15 (09/19 0700) BP: (117-164)/(72-103) 117/84 (09/19 0700) SpO2:  [90 %-97 %] 95 % (09/19 0700)    Intake/Output from previous day: 09/18 0701 - 09/19 0700 In: 1000 [I.V.:1000] Out: 700 [Urine:700] Intake/Output this shift: No intake/output data recorded.  PE: General: somnolent. Appears comfortable. NAD HEENT: periorbital ecchymosis bilaterally. Dried old blood left ear. No new apparent fluid at time of my exam Heart: regular, rate, and rhythm.  Palpable radial and pedal pulses bilaterally Lungs: CTAB, no wheezes, rhonchi, or rales noted.  Respiratory effort nonlabored. Snoring Abd: soft, ND, +BS MSK: all 4 extremities are symmetrical with no cyanosis, clubbing, or edema. Skin: warm and dry  Neuro: somnolent. Arouses to pain and and localizes to pain   Lab Results:  Recent Labs    06/16/21 0420 06/17/21 0324  WBC 10.4 9.6  HGB 15.4 15.3  HCT 46.5 45.7  PLT 165 147*   BMET Recent Labs    06/16/21 0420 06/17/21 0324  NA 139 133*  K 4.0 3.8  CL 106 99  CO2 22 23  GLUCOSE 85 89  BUN 8 7*  CREATININE 0.74 0.76  CALCIUM 8.5* 8.4*   PT/INR Recent Labs    06/15/21 2240  LABPROT 13.2  INR 1.0   CMP     Component Value Date/Time   NA 133 (L) 06/17/2021 0324   K 3.8 06/17/2021 0324   CL 99 06/17/2021 0324   CO2 23 06/17/2021 0324   GLUCOSE 89 06/17/2021 0324   BUN 7 (L) 06/17/2021 0324   CREATININE 0.76 06/17/2021 0324   CALCIUM 8.4 (L) 06/17/2021 0324   PROT 6.5 06/15/2021 2240   ALBUMIN 3.6 06/15/2021 2240   AST 38 06/15/2021 2240   ALT 37 06/15/2021 2240   ALKPHOS 52 06/15/2021 2240   BILITOT 0.7 06/15/2021  2240   GFRNONAA >60 06/17/2021 0324   Lipase  No results found for: LIPASE     Studies/Results: CT HEAD WO CONTRAST (5MM)  Result Date: 06/16/2021 CLINICAL DATA:  Head trauma with abnormal mental status EXAM: CT HEAD WITHOUT CONTRAST TECHNIQUE: Contiguous axial images were obtained from the base of the skull through the vertex without intravenous contrast. COMPARISON:  Yesterday FINDINGS: Brain: Increase in subarachnoid hemorrhage seen along the convexities with generalized appearance that is thin. Subdural hemorrhage along the right cerebral convexity measuring up to 5 mm in thickness. No complicating infarct or hydrocephalus. Intraventricular hemorrhage in the occipital horns is trace and slightly increased. Pneumocephalus that is non progressed. Vascular: No hyperdense vessel or unexpected calcification. Skull: Nondepressed left parietal and temporal bone fractures reaching the partially opacified left middle ear and mastoid. Subtly prominent spacing of the leftmalleoincudal joint. Sinuses/Orbits: Scattered Hemosinus. Skull base fracturing as described on dedicated imaging. Left orbital emphysema. Remote left maxillary sinus injury with atelectasis IMPRESSION: 1. Generalized progression of subarachnoid hemorrhage at the vertex. Mild increase in intraventricular hemorrhage. No hydrocephalus. 2. No progression of small subdural hematoma on the right. 3. Calvarial and left temporal bone fracturing with possible malleoincudal injury. No progression of pneumocephalus. Electronically Signed   By: Jorje Guild M.D.   On:  06/16/2021 10:33   CT HEAD WO CONTRAST  Result Date: 06/15/2021 CLINICAL DATA:  Poly trauma. EXAM: CT HEAD WITHOUT CONTRAST TECHNIQUE: Contiguous axial images were obtained from the base of the skull through the vertex without intravenous contrast. COMPARISON:  None. FINDINGS: Brain: Subarachnoid hemorrhage in the inferior aspect of the left temporal lobe in the temporal sulci. Small  amount of intraventricular hemorrhage in the left posterior horn. Suggestion of surface hemorrhage along the right frontal region likely representing contusion. Intracranial gas in the temporal regions bilaterally, likely subdural. No mass effect or midline shift. No ventricular dilatation. Gray-white matter junctions are distinct. Basal cisterns are not effaced. Vascular: Intracranial arterial vascular calcifications. Skull: Nondepressed fracture along the left posterior temporal region. Skull base fractures involving the left sphenoid bone and maxillary antral walls. Sinuses/Orbits: See CT maxillofacial. Other: Subcutaneous emphysema along the left temporal region and masseter space. IMPRESSION: 1. Left temporal subarachnoid hemorrhage in the sulci. Left posterior ventricular horn intraventricular hemorrhage. Surface hemorrhage along the right frontal region likely representing parenchymal contusion. Intracranial gas bilaterally, likely subdural. 2. Nondepressed fracture of the left temporal bone. Fractures of the skull base involving the left sphenoid bone and left maxillary antral walls. See additional report of CT maxillofacial. 3. Left middle ear and mastoid fluid, likely posttraumatic. Critical Value/emergent results were called by telephone at the time of interpretation on 06/15/2021 at 11:13 pm to provider Dr. Thermon Leyland, who verbally acknowledged these results. Electronically Signed   By: Lucienne Capers M.D.   On: 06/15/2021 23:20   CT CHEST W CONTRAST  Result Date: 06/15/2021 CLINICAL DATA:  Poly trauma, critical, head and C-spine injury suspected. Level 1 trauma. EXAM: CT CHEST, ABDOMEN, AND PELVIS WITH CONTRAST TECHNIQUE: Multidetector CT imaging of the chest, abdomen and pelvis was performed following the standard protocol during bolus administration of intravenous contrast. CONTRAST:  153m OMNIPAQUE IOHEXOL 350 MG/ML SOLN COMPARISON:  None. FINDINGS: CT CHEST FINDINGS Cardiovascular: No  significant vascular findings. Normal heart size. No pericardial effusion. Mediastinum/Nodes: Esophagus is decompressed. Moderately prominent lymph nodes in the mediastinum without pathologic enlargement. Largest nodes in the left aortopulmonic window measure about 12 mm in short axis dimension. Thyroid gland is unremarkable. Lungs/Pleura: Emphysematous changes in the lungs with scarring in the lung apices and periphery. Mild dependent changes in the lung bases. No focal consolidation. No pleural effusions. No pneumothorax. Airways are patent. Musculoskeletal: Degenerative changes in the spine. No vertebral compression deformities. Pectus carina configuration of the sternum. No acute depressed sternal fractures. Visualized ribs are nondepressed. Visualized portions of the shoulders and clavicles are unremarkable except for degenerative change. CT ABDOMEN PELVIS FINDINGS Hepatobiliary: Diffuse fatty infiltration of the liver. Portal veins are patent. No focal lesions. Small stones in the gallbladder. No gallbladder wall thickening or edema. No bile duct dilatation. Pancreas: Unremarkable. No pancreatic ductal dilatation or surrounding inflammatory changes. Spleen: No splenic injury or perisplenic hematoma. Adrenals/Urinary Tract: No adrenal gland nodules. Chronic appearing scarring in the upper pole of the left kidney. Renal nephrograms are otherwise homogeneous. No hydronephrosis or hydroureter. Bladder is unremarkable. Stomach/Bowel: Stomach, small bowel, and colon are not abnormally distended. No wall thickening or inflammatory changes. Scattered diverticula without evidence of diverticulitis. Appendix is normal. No mesenteric hematoma. Vascular/Lymphatic: Aortic atherosclerosis. No enlarged abdominal or pelvic lymph nodes. Reproductive: Prostate gland is enlarged. Other: No free air or free fluid in the abdomen. Abdominal wall musculature appears intact. Musculoskeletal: Degenerative changes in the spine. No  vertebral compression deformities. Sacrum, pelvis, and hips appear intact. IMPRESSION:  1. No acute posttraumatic changes demonstrated in the chest. No evidence of mediastinal injury or pulmonary parenchymal injury. 2. No acute posttraumatic changes demonstrated in the abdomen or pelvis. No evidence of solid organ injury or bowel perforation. 3. Incidental findings including aortic atherosclerosis, emphysematous changes in the lungs, fatty infiltration of the liver, cholelithiasis, chronic scarring in the left kidney. Critical Value/emergent results were called by telephone at the time of interpretation on 06/15/2021 at 11:15 pm to provider Dr. Thermon Leyland, who verbally acknowledged these results. Electronically Signed   By: Lucienne Capers M.D.   On: 06/15/2021 23:41   CT CERVICAL SPINE WO CONTRAST  Result Date: 06/15/2021 CLINICAL DATA:  Poly trauma, critical, head and C-spine injury suspected. Trauma level 1. EXAM: CT CERVICAL SPINE WITHOUT CONTRAST TECHNIQUE: Multidetector CT imaging of the cervical spine was performed without intravenous contrast. Multiplanar CT image reconstructions were also generated. COMPARISON:  None. FINDINGS: Alignment: Motion artifact limits examination.  Normal alignment. Skull base and vertebrae: Non depressed fractures of the left temporal bone focally extending to the base of the left parietal bone. Fractures of the greater and lesser wing of the left sphenoid bone. Associated soft tissue and subdural emphysema. Fluid in the left middle ear and some of the left mastoid air cells is likely posttraumatic. See additional report of CT maxillofacial bones. No vertebral compression deformities. No focal bone lesion or bone destruction. C1-2 articulation appears intact. Soft tissues and spinal canal: No prevertebral fluid or swelling. No visible canal hematoma. Disc levels: Degenerative changes with disc space narrowing and endplate osteophyte formation most prominent at C5-6 and C6-7  levels. Upper chest: Mild emphysematous changes in the lung apices. Other: None. IMPRESSION: 1. Normal alignment of the cervical spine. No acute displaced cervical spine fractures identified. 2. Moderate degenerative changes in the cervical spine. 3. Skull base fractures involving the left temporal bone and left sphenoid bone. Associated soft tissue and subdural emphysema. Fluid in the left middle ear and left mastoid air cells is likely posttraumatic. Critical Value/emergent results were called by telephone at the time of interpretation on 06/15/2021 at 11:13 pm to provider Dr. Thermon Leyland, who verbally acknowledged these results. Electronically Signed   By: Lucienne Capers M.D.   On: 06/15/2021 23:25   CT ABDOMEN PELVIS W CONTRAST  Result Date: 06/15/2021 CLINICAL DATA:  Poly trauma, critical, head and C-spine injury suspected. Level 1 trauma. EXAM: CT CHEST, ABDOMEN, AND PELVIS WITH CONTRAST TECHNIQUE: Multidetector CT imaging of the chest, abdomen and pelvis was performed following the standard protocol during bolus administration of intravenous contrast. CONTRAST:  161m OMNIPAQUE IOHEXOL 350 MG/ML SOLN COMPARISON:  None. FINDINGS: CT CHEST FINDINGS Cardiovascular: No significant vascular findings. Normal heart size. No pericardial effusion. Mediastinum/Nodes: Esophagus is decompressed. Moderately prominent lymph nodes in the mediastinum without pathologic enlargement. Largest nodes in the left aortopulmonic window measure about 12 mm in short axis dimension. Thyroid gland is unremarkable. Lungs/Pleura: Emphysematous changes in the lungs with scarring in the lung apices and periphery. Mild dependent changes in the lung bases. No focal consolidation. No pleural effusions. No pneumothorax. Airways are patent. Musculoskeletal: Degenerative changes in the spine. No vertebral compression deformities. Pectus carina configuration of the sternum. No acute depressed sternal fractures. Visualized ribs are  nondepressed. Visualized portions of the shoulders and clavicles are unremarkable except for degenerative change. CT ABDOMEN PELVIS FINDINGS Hepatobiliary: Diffuse fatty infiltration of the liver. Portal veins are patent. No focal lesions. Small stones in the gallbladder. No gallbladder wall thickening or edema. No bile  duct dilatation. Pancreas: Unremarkable. No pancreatic ductal dilatation or surrounding inflammatory changes. Spleen: No splenic injury or perisplenic hematoma. Adrenals/Urinary Tract: No adrenal gland nodules. Chronic appearing scarring in the upper pole of the left kidney. Renal nephrograms are otherwise homogeneous. No hydronephrosis or hydroureter. Bladder is unremarkable. Stomach/Bowel: Stomach, small bowel, and colon are not abnormally distended. No wall thickening or inflammatory changes. Scattered diverticula without evidence of diverticulitis. Appendix is normal. No mesenteric hematoma. Vascular/Lymphatic: Aortic atherosclerosis. No enlarged abdominal or pelvic lymph nodes. Reproductive: Prostate gland is enlarged. Other: No free air or free fluid in the abdomen. Abdominal wall musculature appears intact. Musculoskeletal: Degenerative changes in the spine. No vertebral compression deformities. Sacrum, pelvis, and hips appear intact. IMPRESSION: 1. No acute posttraumatic changes demonstrated in the chest. No evidence of mediastinal injury or pulmonary parenchymal injury. 2. No acute posttraumatic changes demonstrated in the abdomen or pelvis. No evidence of solid organ injury or bowel perforation. 3. Incidental findings including aortic atherosclerosis, emphysematous changes in the lungs, fatty infiltration of the liver, cholelithiasis, chronic scarring in the left kidney. Critical Value/emergent results were called by telephone at the time of interpretation on 06/15/2021 at 11:15 pm to provider Dr. Thermon Leyland, who verbally acknowledged these results. Electronically Signed   By: Lucienne Capers M.D.   On: 06/15/2021 23:41   CT MAXILLOFACIAL WO CONTRAST  Result Date: 06/15/2021 CLINICAL DATA:  Poly trauma, critical, head and C-spine injury suspected. Level 1 trauma. EXAM: CT MAXILLOFACIAL WITHOUT CONTRAST TECHNIQUE: Multidetector CT imaging of the maxillofacial structures was performed. Multiplanar CT image reconstructions were also generated. COMPARISON:  None. FINDINGS: Osseous: Old posttraumatic changes are present. Old nasal bone fracture deformities. Plate and screw fixation of the left anterior maxillary wall. There is depression of the left inferior orbital rim which appears to be old. Acute mildly depressed fractures are suggested in the anterior and lateral left maxillary antral walls. Acute nondisplaced fractures demonstrated in the left sphenoid bone involving greater and lesser wing, and in the left temporal bone. There is associated soft tissue emphysema in the postseptal extraconal left orbit, in the masseter spaces and sub zygomatic arch region, and in the left temporal scalp region. Subdural gas is also demonstrated in the temporal regions bilaterally but greater on the left. Orbits: Extraconal soft tissue air demonstrated in the left orbit as above. Globes and extraocular muscles are otherwise symmetrical in appearance. No significant displacement. Sinuses: Opacification of the left maxillary antrum and of the sphenoid sinus with fluid demonstrated in the left middle ear and some of the left mastoid air cells, likely posttraumatic. Mucosal thickening of some of the ethmoid air cells and right maxillary antrum, likely inflammatory. Soft tissues: Soft tissue infiltration and soft tissue edema on the left as above. IMPRESSION: 1. Acute nondisplaced fractures demonstrated in the left anterior and lateral maxillary antral walls, left sphenoid bone, left temporal bone. 2. Old fracture deformities suggested in the nasal bones and inferior left orbital rim with plate and screw fixation  of the left anterior maxillary antral wall. 3. Soft tissue emphysema demonstrated in the extraconal left orbit, left temporal and masseter soft tissues, and bilateral subdural spaces consistent with posttraumatic change. 4. Fluid in the left maxillary antrum, sphenoid sinus, left middle ear, and left mastoid air cells is likely posttraumatic. Critical Value/emergent results were called by telephone at the time of interpretation on 06/15/2021 at 11:15 pm to provider Dr. Thermon Leyland, who verbally acknowledged these results. Electronically Signed   By: Oren Beckmann.D.  On: 06/15/2021 23:33    Anti-infectives: Anti-infectives (From admission, onward)    None        Assessment/Plan 60 y.o. male found down with blood draining from left ear 9/17.  Subarachnoid hemorrhage, intraventricular hemorrhage, parenchymal contusion - NSGY consulted. repeat [facility] 9/18 w/ progression of SAH. No progression of intraventricular hemorrhage. Keppra 500 mg bid x7 days started 9/18 Skull fracture - NSGY consulted Nondisplaced facial fractures - unable to get reliable exam, some old fractures in this area as well, d/w Dr. Claudia Desanctis- nonop based on scans, will examine when patient can cooperate more with exam Intoxication - CIWA  Called and updated son Paden Hayden who is patient's primary contact while patient remains unable to make medical decisions  FEN - NPO given mental status, IVF VTE - SCDs, lovenox held for now ID - None     LOS: 2 days    Winferd Humphrey, [facility] 06/17/2021, 7:34 AM Please see Amion for pager number during day hours 7:00am-4:30pm

## 2021-06-17 NOTE — ED Notes (Signed)
Called and spoke with patient's father Gwyndolyn Saxon [phone] states he will come and pick him up however he lives in [place] and it will take him a little bit.

## 2021-06-17 NOTE — ED Notes (Signed)
Pt tossing and turning in bed.  Grabbing R side of head as if in pain.  Attempting to pull out L AC IV.

## 2021-06-18 LAB — BASIC METABOLIC PANEL
Anion gap: 14 (ref 5–15)
BUN: 7 mg/dL — ABNORMAL LOW (ref 8–23)
CO2: 21 mmol/L — ABNORMAL LOW (ref 22–32)
Calcium: 8.7 mg/dL — ABNORMAL LOW (ref 8.9–10.3)
Chloride: 99 mmol/L (ref 98–111)
Creatinine, Ser: 0.77 mg/dL (ref 0.61–1.24)
GFR, Estimated: >60 mL/min (ref >60)
Glucose, Bld: 100 mg/dL — ABNORMAL HIGH (ref 70–99)
Potassium: 4 mmol/L (ref 3.5–5.1)
Sodium: 134 mmol/L — ABNORMAL LOW (ref 135–145)

## 2021-06-18 LAB — CBC
HCT: 46.1 % (ref 39.0–52.0)
Hemoglobin: 16 g/dL (ref 13.0–17.0)
MCH: 33.5 pg (ref 26.0–34.0)
MCHC: 34.7 g/dL (ref 30.0–36.0)
MCV: 96.4 fL (ref 80.0–100.0)
Platelets: 147 10*3/uL — ABNORMAL LOW (ref 150–400)
RBC: 4.78 MIL/uL (ref 4.22–5.81)
RDW: 11.2 % — ABNORMAL LOW (ref 11.5–15.5)
WBC: 9.8 10*3/uL (ref 4.0–10.5)
nRBC: 0 % (ref 0.0–0.2)

## 2021-06-18 MED ORDER — SODIUM CHLORIDE 0.9 % IV SOLN: INTRAVENOUS | Status: DC

## 2021-06-18 MED ORDER — POTASSIUM CHLORIDE IN NACL 20-0.9 MEQ/L-% IV SOLN
INTRAVENOUS | Status: DC
  Filled 2021-06-18 (×5): qty 1000

## 2021-06-18 NOTE — Progress Notes (Signed)
SLP Cancellation Note  Patient Details Name: Randall Harvey. MRN: [id number] DOB: Jun 17, 1961   Cancelled treatment:        Pt intubated and sedated. Will continue efforts.    [name] 06/18/2021, 1:55 PM  Orbie Pyo Colvin Caroli.Ed Risk analyst [phone] Office [phone]

## 2021-06-18 NOTE — Progress Notes (Signed)
Attempted to see pt but pt very lethargic per PT after being given medication and not following commands or participating. Will hold until tomorrow.  Jinger Neighbors, [place] [phone]

## 2021-06-18 NOTE — Progress Notes (Signed)
Patient ID: Randall Harvey., male   DOB: 1961-03-07, 60 y.o.   MRN: [id number]     Subjective: Does not offer complaint ROS negative except as listed above. Objective: Vital signs in last 24 hours: Temp:  [97.2 F (36.2 C)-99.1 F (37.3 C)] 97.7 F (36.5 C) (09/20 0700) Pulse Rate:  [72-112] 83 (09/20 0600) Resp:  [12-22] 16 (09/20 0413) BP: (134-180)/(67-95) 180/78 (09/20 0700) SpO2:  [94 %-98 %] 96 % (09/20 0413)    Intake/Output from previous day: 09/19 0701 - 09/20 0700 In: 3009 [I.V.:2909; IV Piggyback:100] Out: 2725 [Urine:2725] Intake/Output this shift: Total I/O In: -  Out: 375 [Urine:375]  General appearance: agitated Ears: dry crust L ear canal - no new fluid Resp: clear to auscultation bilaterally Cardio: regular rate and rhythm GI: soft, non-tender; bowel sounds normal; no masses,  no organomegaly Extremities: no deformity or tenderness Neurologic: Mental status: agitated, does not open eyes, does F/C to squeeze hand and stick out tongue  Lab Results: CBC  Recent Labs    06/17/21 0324 06/18/21 0047  WBC 9.6 9.8  HGB 15.3 16.0  HCT 45.7 46.1  PLT 147* 147*   BMET Recent Labs    06/17/21 0324 06/18/21 0047  NA 133* 134*  K 3.8 4.0  CL 99 99  CO2 23 21*  GLUCOSE 89 100*  BUN 7* 7*  CREATININE 0.76 0.77  CALCIUM 8.4* 8.7*   PT/INR Recent Labs    06/15/21 2240  LABPROT 13.2  INR 1.0   ABG No results for input(s): PHART, HCO3 in the last 72 hours.  Invalid input(s): PCO2, PO2  Studies/Results: CT HEAD WO CONTRAST (5MM)  Result Date: 06/16/2021 CLINICAL DATA:  Head trauma with abnormal mental status EXAM: CT HEAD WITHOUT CONTRAST TECHNIQUE: Contiguous axial images were obtained from the base of the skull through the vertex without intravenous contrast. COMPARISON:  Yesterday FINDINGS: Brain: Increase in subarachnoid hemorrhage seen along the convexities with generalized appearance that is thin. Subdural hemorrhage along the right cerebral  convexity measuring up to 5 mm in thickness. No complicating infarct or hydrocephalus. Intraventricular hemorrhage in the occipital horns is trace and slightly increased. Pneumocephalus that is non progressed. Vascular: No hyperdense vessel or unexpected calcification. Skull: Nondepressed left parietal and temporal bone fractures reaching the partially opacified left middle ear and mastoid. Subtly prominent spacing of the leftmalleoincudal joint. Sinuses/Orbits: Scattered Hemosinus. Skull base fracturing as described on dedicated imaging. Left orbital emphysema. Remote left maxillary sinus injury with atelectasis IMPRESSION: 1. Generalized progression of subarachnoid hemorrhage at the vertex. Mild increase in intraventricular hemorrhage. No hydrocephalus. 2. No progression of small subdural hematoma on the right. 3. Calvarial and left temporal bone fracturing with possible malleoincudal injury. No progression of pneumocephalus. Electronically Signed   By: Jorje Guild M.D.   On: 06/16/2021 10:33    Anti-infectives: Anti-infectives (From admission, onward)    None       Assessment/Plan: 60 y.o. male found down with blood draining from left ear 9/17.  Subarachnoid hemorrhage, intraventricular hemorrhage, parenchymal contusion - Per Dr. Kathyrn Sheriff, repeat [facility] 9/18 w/ progression of SAH. No progression of intraventricular hemorrhage. Keppra 500 mg bid x7 days started 9/18 Skull fracture - NSGY consulted Nondisplaced facial fractures - unable to get reliable exam, some old fractures in this area as well, d/w Dr. Claudia Desanctis- nonop based on scans, will examine when patient can cooperate more with exam Intoxication - CIWA renewed  Called and updated son Randall Harvey 9/19 who is patient's primary  contact while patient remains unable to make medical decisions  FEN - NPO given mental status, IVF. Plan COrtrak tomorrow if does not pass swallow eval VTE - SCDs, lovenox held for now ID - None  Dispo - TBI team  therapies  LOS: 3 days    Georganna Skeans, MD, MPH, FACS Trauma & General Surgery Use [email or web address] to contact on call provider  06/18/2021

## 2021-06-18 NOTE — Progress Notes (Signed)
  NEUROSURGERY PROGRESS NOTE   No issues overnight. No drainage/bleeding from ear  EXAM:  BP (!) 180/78 (BP Location: Right Arm)   Pulse 83   Temp 97.7 F (36.5 C) (Oral)   Resp 16   Ht 5\' 9"  (1.753 m)   Wt 72.6 kg   SpO2 96%   BMI 23.63 kg/m   Drowsy but arouses to voice Does not respond verbally but follows simple commands CN grossly intact  MAE well  IMPRESSION:  60 y.o. male found down with minor ICH, TBI. Left temporal skull fracture, no CSF otorrhea  PLAN: - Finish 7d keppra - can f/u PRN in NS clinic - May need outpatient ENT for ?middle ear fracture   Consuella Lose, MD [facility]

## 2021-06-18 NOTE — Consult Note (Signed)
Reason for Consult/CC: Facial fractures  Randall Harvey. is an 60 y.o. male.  HPI: Patient presents after being found down.  He has been combative and aggressive for the past couple days.  He has had some subarachnoid hemorrhage the neurosurgery has been following.  CT scan showed multiple minimally displaced facial fractures as well as some old facial fractures with old plating in place.  I am consulted to evaluate him for those.  He remains difficult to arouse and cannot cooperate with history or exam.  Allergies: No Known Allergies  Medications:  Current Facility-Administered Medications:    0.9 % NaCl with KCl 20 mEq/ L  infusion, , Intravenous, Continuous, Georganna Skeans, MD   acetaminophen (TYLENOL) tablet 650 mg, 650 mg, Oral, Q6H, Stechschulte, Nickola Major, MD   docusate sodium (COLACE) capsule 100 mg, 100 mg, Oral, BID, Stechschulte, Nickola Major, MD   folic acid (FOLVITE) tablet 1 mg, 1 mg, Oral, Daily, Stechschulte, Nickola Major, MD   gabapentin (NEURONTIN) capsule 300 mg, 300 mg, Oral, TID, Stechschulte, Nickola Major, MD   HYDROmorphone (DILAUDID) injection 0.5 mg, 0.5 mg, Intravenous, Q3H PRN, Stechschulte, Nickola Major, MD, 0.5 mg at 06/17/21 0520   levETIRAcetam (KEPPRA) IVPB 500 mg/100 mL premix, 500 mg, Intravenous, Q12H, Consuella Lose, MD, Last Rate: 400 mL/hr at 06/18/21 1042, 500 mg at 06/18/21 1042   LORazepam (ATIVAN) tablet 1-4 mg, 1-4 mg, Oral, Q1H PRN **OR** LORazepam (ATIVAN) injection 1-4 mg, 1-4 mg, Intravenous, Q1H PRN, Georganna Skeans, MD, 2 mg at 06/18/21 1037   methocarbamol (ROBAXIN) 500 mg in dextrose 5 % 50 mL IVPB, 500 mg, Intravenous, Q6H PRN, Stechschulte, Nickola Major, MD   multivitamin with minerals tablet 1 tablet, 1 tablet, Oral, Daily, Stechschulte, Nickola Major, MD   ondansetron [facility]) injection 4 mg, 4 mg, Intravenous, Q6H PRN, Stechschulte, Nickola Major, MD, 4 mg at 06/16/21 0359   oxyCODONE (Oxy IR/ROXICODONE) immediate release tablet 10 mg, 10 mg, Oral, Q4H PRN, Stechschulte, Nickola Major,  MD   oxyCODONE (Oxy IR/ROXICODONE) immediate release tablet 5 mg, 5 mg, Oral, Q4H PRN, Stechschulte, Nickola Major, MD   prochlorperazine (COMPAZINE) injection 10 mg, 10 mg, Intravenous, Q4H PRN, Stechschulte, Nickola Major, MD   simethicone (MYLICON) chewable tablet 80 mg, 80 mg, Oral, QID PRN, Stechschulte, Nickola Major, MD   sodium phosphate (FLEET) 7-19 GM/118ML enema 1 enema, 1 enema, Rectal, Daily PRN, Stechschulte, Nickola Major, MD   thiamine tablet 100 mg, 100 mg, Oral, Daily **OR** thiamine (B-1) injection 100 mg, 100 mg, Intravenous, Daily, Stechschulte, Nickola Major, MD, 100 mg at 06/18/21 1017  Past Medical History:  Diagnosis Date   COPD (chronic obstructive pulmonary disease) ([place])    Hypertension    Pulmonary embolus with infarction ([place])    Renal cell carcinoma ([place])    Stroke ([place])    Thrombocythemia     Past Surgical History:  Procedure Laterality Date   ANKLE SURGERY     ORBITAL FRACTURE SURGERY Left     No family history on file.  Social History:  reports that he has been smoking cigarettes. He has never used smokeless tobacco. He reports current alcohol use. He reports current drug use. Drug: Marijuana.  Physical Exam Blood pressure (!) 180/78, pulse 83, temperature 97.7 F (36.5 C), temperature source Oral, resp. rate 16, height 5\' 9"  (1.753 m), weight 72.6 kg, SpO2 96 %. General: No acute distress, difficult to arouse HEENT: No gross external malalignment.  Seems to have normal occlusion for him.  Cranial nerves grossly  intact.  No obvious enophthalmos or exophthalmos.  Results for orders placed or performed during the hospital encounter of 06/15/21 (from the past 48 hour(s))  Basic metabolic panel     Status: Abnormal   Collection Time: 06/17/21  3:24 AM  Result Value Ref Range   Sodium 133 (L) 135 - 145 mmol/L   Potassium 3.8 3.5 - 5.1 mmol/L   Chloride 99 98 - 111 mmol/L   CO2 23 22 - 32 mmol/L   Glucose, Bld 89 70 - 99 mg/dL    Comment: Glucose reference range applies only to  samples taken after fasting for at least 8 hours.   BUN 7 (L) 8 - 23 mg/dL   Creatinine, Ser 0.76 0.61 - 1.24 mg/dL   Calcium 8.4 (L) 8.9 - 10.3 mg/dL   GFR, Estimated >60 >60 mL/min    Comment: (NOTE) Calculated using the CKD-EPI Creatinine Equation (2021)    Anion gap 11 5 - 15    Comment: Performed at [facility] [address], [place], [place] [address]  CBC     Status: Abnormal   Collection Time: 06/17/21  3:24 AM  Result Value Ref Range   WBC 9.6 4.0 - 10.5 K/uL   RBC 4.61 4.22 - 5.81 MIL/uL   Hemoglobin 15.3 13.0 - 17.0 g/dL   HCT 45.7 39.0 - 52.0 %   MCV 99.1 80.0 - 100.0 fL   MCH 33.2 26.0 - 34.0 pg   MCHC 33.5 30.0 - 36.0 g/dL   RDW 11.4 (L) 11.5 - 15.5 %   Platelets 147 (L) 150 - 400 K/uL   nRBC 0.0 0.0 - 0.2 %    Comment: Performed at [facility], [place] [address], [place], [place] [address]  Basic metabolic panel     Status: Abnormal   Collection Time: 06/18/21 12:47 AM  Result Value Ref Range   Sodium 134 (L) 135 - 145 mmol/L   Potassium 4.0 3.5 - 5.1 mmol/L   Chloride 99 98 - 111 mmol/L   CO2 21 (L) 22 - 32 mmol/L   Glucose, Bld 100 (H) 70 - 99 mg/dL    Comment: Glucose reference range applies only to samples taken after fasting for at least 8 hours.   BUN 7 (L) 8 - 23 mg/dL   Creatinine, Ser 0.77 0.61 - 1.24 mg/dL   Calcium 8.7 (L) 8.9 - 10.3 mg/dL   GFR, Estimated >60 >60 mL/min    Comment: (NOTE) Calculated using the CKD-EPI Creatinine Equation (2021)    Anion gap 14 5 - 15    Comment: Performed at [facility] [address], [place], [place] [address]  CBC     Status: Abnormal   Collection Time: 06/18/21 12:47 AM  Result Value Ref Range   WBC 9.8 4.0 - 10.5 K/uL   RBC 4.78 4.22 - 5.81 MIL/uL   Hemoglobin 16.0 13.0 - 17.0 g/dL   HCT 46.1 39.0 - 52.0 %   MCV 96.4 80.0 - 100.0 fL   MCH 33.5 26.0 - 34.0 pg   MCHC 34.7 30.0 - 36.0 g/dL   RDW 11.2 (L) 11.5 - 15.5 %   Platelets 147 (L) 150 - 400 K/uL    Comment: REPEATED TO  VERIFY   nRBC 0.0 0.0 - 0.2 %    Comment: Performed at [facility], [place] [address] [place], [place] [address]    No results found.  Assessment/Plan: Patient presents with minimally displaced facial  fractures.  Recommend nonoperative treatment of these.  Recommend head of bed elevation to help with swelling.  Recommend outpatient follow-up with me after he is ready to be discharged where I could hopefully get a better history and exam from him.  No other acute interventions planned.  Cindra Presume 06/18/2021, 1:04 PM

## 2021-06-18 NOTE — Care Management Important Message (Signed)
Important Message  Patient Details  Name: Randall Harvey. MRN: [id number] Date of Birth: 05/01/1961   Medicare Important Message Given:  Yes       06/18/2021, 3:36 PM

## 2021-06-18 NOTE — Evaluation (Signed)
Physical Therapy Evaluation Patient Details Name: Randall Harvey. MRN: [id number] DOB: 1960/12/27 Today's Date: 06/18/2021  History of Present Illness  Pt is 60 yo male found down and inebriated with blood from L ear. Found to have L SAH and L temporal bone, L sphenoid, L maxillary fxs. PMH: COPD, HTN, PE, renal cell carcinoma, CVA, thrombocytopenia.  Clinical Impression  Pt admitted with above diagnosis. Pt very lethargic on eval. Tot A to achieve propped sitting EOB with HOB elevated and pt mildly more alert but not enough to advance mobility. Was able to put bed in chair position and perform PROM to BUE's and LE's with no signs of pain from pt. Expect he will need SNF level care upon d/c but this may change once he is more alert.  Pt currently with functional limitations due to the deficits listed below (see PT Problem List). Pt will benefit from skilled PT to increase their independence and safety with mobility to allow discharge to the venue listed below.          Recommendations for follow up therapy are one component of a multi-disciplinary discharge planning process, led by the attending physician.  Recommendations may be updated based on patient status, additional functional criteria and insurance authorization.  Follow Up Recommendations SNF;Supervision/Assistance - 24 hour    Equipment Recommendations  Other (comment) (TBD)    Recommendations for Other Services OT consult     Precautions / Restrictions Precautions Precautions: Fall Restrictions Weight Bearing Restrictions: No      Mobility  Bed Mobility Overal bed mobility: Needs Assistance Bed Mobility: Supine to Sit;Sit to Supine     Supine to sit: Total assist;HOB elevated Sit to supine: Total assist   General bed mobility comments: pt's LE's moved off bed with total A and pt into partial sitting position, pt remained dependent for maintaining balance and was mildly more alert but not enough to move fwd with  mobility. Tot A for return to supine    Transfers                 General transfer comment: unable due to lethargy  Ambulation/Gait             General Gait Details: unable due to lethargy  Stairs            Wheelchair Mobility    Modified Rankin (Stroke Patients Only) Modified Rankin (Stroke Patients Only) Pre-Morbid Rankin Score: No symptoms Modified Rankin: Severe disability     Balance Overall balance assessment: Needs assistance Sitting-balance support: No upper extremity supported;Feet unsupported Sitting balance-Leahy Scale: Zero                                       Pertinent Vitals/Pain Pain Assessment: Faces Pain Score: 0-No pain    Home Living                   Additional Comments: pt very lethargic, no family present, unable to answer PLOF questions    Prior Function Level of Independence: Independent               Hand Dominance        Extremity/Trunk Assessment   Upper Extremity Assessment Upper Extremity Assessment: Difficult to assess due to impaired cognition    Lower Extremity Assessment Lower Extremity Assessment: Difficult to assess due to impaired cognition       Communication  Communication: Other (comment) (minimal verbalization)  Cognition Arousal/Alertness: Lethargic;Suspect due to medications Behavior During Therapy: Flat affect Overall Cognitive Status: Impaired/Different from baseline                                 General Comments: no family present, pt opened eyes when in partial sitting but otherwise did not engage. Did not show sign of pain with passive mvmt of UE's and LE's      General Comments General comments (skin integrity, edema, etc.): BP 172/85    Exercises General Exercises - Lower Extremity Ankle Circles/Pumps: PROM;Both;10 reps;Supine Short Arc Quad: PROM;Both;10 reps;Supine Heel Slides: PROM;Both;10 reps;Supine Straight Leg Raises:  PROM;Both;10 reps;Supine   Assessment/Plan    PT Assessment Patient needs continued PT services  PT Problem List Decreased strength;Decreased range of motion;Decreased activity tolerance;Decreased balance;Decreased mobility;Decreased coordination;Decreased cognition;Decreased knowledge of use of DME;Decreased safety awareness;Decreased knowledge of precautions;Cardiopulmonary status limiting activity       PT Treatment Interventions DME instruction;Gait training;Stair training;Functional mobility training;Therapeutic activities;Therapeutic exercise;Balance training;Neuromuscular re-education;Cognitive remediation;Patient/family education    PT Goals (Current goals can be found in the Care Plan section)  Acute Rehab PT Goals Patient Stated Goal: none stated PT Goal Formulation: Patient unable to participate in goal setting Time For Goal Achievement: 07/02/21 Potential to Achieve Goals: Fair    Frequency Min 4X/week   Barriers to discharge   unsure of family support    Co-evaluation               AM-PAC PT "6 Clicks" Mobility  Outcome Measure Help needed turning from your back to your side while in a flat bed without using bedrails?: Total Help needed moving from lying on your back to sitting on the side of a flat bed without using bedrails?: Total Help needed moving to and from a bed to a chair (including a wheelchair)?: Total Help needed standing up from a chair using your arms (e.g., wheelchair or bedside chair)?: Total Help needed to walk in hospital room?: Total Help needed climbing 3-5 steps with a railing? : Total 6 Click Score: 6    End of Session   Activity Tolerance: Patient limited by lethargy Patient left: in bed;with call bell/phone within reach;with bed alarm set Nurse Communication: Mobility status PT Visit Diagnosis: Difficulty in walking, not elsewhere classified (R26.2)    Time: 1140-1155 PT Time Calculation (min) (ACUTE ONLY): 15 min   Charges:    PT Evaluation $PT Eval Moderate Complexity: [place], [place]  Pager [phone] Office [place] 06/18/2021, 3:03 PM

## 2021-06-19 ENCOUNTER — Inpatient Hospital Stay (HOSPITAL_COMMUNITY): Payer: Medicare Other

## 2021-06-19 LAB — CBC
HCT: 45.1 % (ref 39.0–52.0)
Hemoglobin: 15.6 g/dL (ref 13.0–17.0)
MCH: 33.3 pg (ref 26.0–34.0)
MCHC: 34.6 g/dL (ref 30.0–36.0)
MCV: 96.2 fL (ref 80.0–100.0)
Platelets: 163 10*3/uL (ref 150–400)
RBC: 4.69 MIL/uL (ref 4.22–5.81)
RDW: 11.2 % — ABNORMAL LOW (ref 11.5–15.5)
WBC: 8.8 10*3/uL (ref 4.0–10.5)
nRBC: 0 % (ref 0.0–0.2)

## 2021-06-19 LAB — BASIC METABOLIC PANEL
Anion gap: 12 (ref 5–15)
BUN: 8 mg/dL (ref 8–23)
CO2: 24 mmol/L (ref 22–32)
Calcium: 8.8 mg/dL — ABNORMAL LOW (ref 8.9–10.3)
Chloride: 103 mmol/L (ref 98–111)
Creatinine, Ser: 0.9 mg/dL (ref 0.61–1.24)
GFR, Estimated: >60 mL/min (ref >60)
Glucose, Bld: 100 mg/dL — ABNORMAL HIGH (ref 70–99)
Potassium: 4.1 mmol/L (ref 3.5–5.1)
Sodium: 139 mmol/L (ref 135–145)

## 2021-06-19 LAB — PHOSPHORUS: Phosphorus: 3.5 mg/dL (ref 2.5–4.6)

## 2021-06-19 LAB — GLUCOSE, CAPILLARY
Glucose-Capillary: 139 mg/dL — ABNORMAL HIGH (ref 70–99)
Glucose-Capillary: 112 mg/dL — ABNORMAL HIGH (ref 70–99)

## 2021-06-19 LAB — MAGNESIUM: Magnesium: 2 mg/dL (ref 1.7–2.4)

## 2021-06-19 MED ORDER — DOCUSATE SODIUM 50 MG/5ML PO LIQD
100.0000 mg | Freq: Two times a day (BID) | ORAL | Status: DC
  Administered 2021-06-19 – 2021-06-29 (×19): 100 mg
  Filled 2021-06-19 (×20): qty 10

## 2021-06-19 MED ORDER — ACETAMINOPHEN 160 MG/5ML PO SOLN
1000.0000 mg | Freq: Four times a day (QID) | ORAL | Status: DC
  Administered 2021-06-19 – 2021-07-06 (×61): 1000 mg
  Filled 2021-06-19 (×64): qty 40.6

## 2021-06-19 MED ORDER — ACETAMINOPHEN 160 MG/5ML PO SOLN: 650.0000 mg | Freq: Four times a day (QID) | ORAL | Status: DC

## 2021-06-19 MED ORDER — ENOXAPARIN SODIUM 40 MG/0.4ML IJ SOSY: 40.0000 mg | PREFILLED_SYRINGE | INTRAMUSCULAR | Status: DC

## 2021-06-19 MED ORDER — POLYETHYLENE GLYCOL 3350 17 G PO PACK: 17.0000 g | PACK | Freq: Every day | ORAL | Status: DC | PRN

## 2021-06-19 MED ORDER — ENOXAPARIN SODIUM 30 MG/0.3ML IJ SOSY
30.0000 mg | PREFILLED_SYRINGE | Freq: Two times a day (BID) | INTRAMUSCULAR | Status: DC
  Administered 2021-06-19 – 2021-07-30 (×82): 30 mg via SUBCUTANEOUS
  Filled 2021-06-19 (×82): qty 0.3

## 2021-06-19 MED ORDER — PIVOT 1.5 CAL PO LIQD
1000.0000 mL | ORAL | Status: DC
  Administered 2021-06-19 – 2021-06-27 (×10): 1000 mL
  Filled 2021-06-19 (×2): qty 1000

## 2021-06-19 MED ORDER — OXYCODONE HCL 5 MG/5ML PO SOLN
5.0000 mg | ORAL | Status: DC | PRN
  Administered 2021-06-21: 5 mg via ORAL
  Administered 2021-06-21 – 2021-06-22 (×3): 10 mg via ORAL
  Filled 2021-06-19 (×4): qty 10

## 2021-06-19 NOTE — TOC CAGE-AID Note (Signed)
Transition of Care [facility]) - CAGE-AID Screening   Patient Details  Name: Randall Harvey. MRN: [id number] Date of Birth: 13-Sep-1961   Elvina Sidle, RN Trauma Response Nurse Phone Number: [phone] 06/19/2021, 12:39 PM  CAGE-AID Screening: Substance Abuse Screening unable to be completed due to: : Patient unable to participate (pt is confused with a TBI- DOES have a hx of etoh and is on a CIWA currently)

## 2021-06-19 NOTE — Progress Notes (Signed)
Occupational Therapy Treatment Patient Details Name: Randall Harvey. MRN: [id number] DOB: 10/28/1960 Today's Date: 06/19/2021   History of present illness Pt is 60 yo male found down and inebriated with blood from L ear. Found to have L SAH and L temporal bone, L sphenoid, L maxillary fxs. PMH: COPD, HTN, PE, renal cell carcinoma, CVA, thrombocytopenia.   OT comments  Pt sustaining eye closed the entire session and when manually opened noted initially to by dysconjugate and then R gaze preference. Pt minimal arousal with increased noxious stimuli and repositioned to sitting. Pt unable to sustain arousal. Recommendation for SNF at this time   Recommendations for follow up therapy are one component of a multi-disciplinary discharge planning process, led by the attending physician.  Recommendations may be updated based on patient status, additional functional criteria and insurance authorization.    Follow Up Recommendations  SNF    Equipment Recommendations  Wheelchair (measurements OT);Wheelchair cushion (measurements OT)    Recommendations for Other Services Speech consult    Precautions / Restrictions Precautions Precautions: Fall Restrictions Weight Bearing Restrictions: No       Mobility Bed Mobility Overal bed mobility: Needs Assistance Bed Mobility: Supine to Sit;Sit to Supine     Supine to sit: Total assist;+2 for physical assistance;+2 for safety/equipment;HOB elevated Sit to supine: Max assist;+2 for physical assistance;+2 for safety/equipment;HOB elevated   General bed mobility comments: totalA+2 to come to sitting EOB with patient not initiating movement. Seated EOB x 5 minutes with no righting reaction noted. Noxious stimuli provided with little to no response. Patient grasping therapist's leg with bilateral feet at times. Returned to supine with maxA+2 as patient was able to bring LEs onto bed.    Transfers                 General transfer comment: unable  due to lethargy    Balance Overall balance assessment: Needs assistance Sitting-balance support: No upper extremity supported;Feet unsupported Sitting balance-Leahy Scale: Zero Sitting balance - Comments: totalA to maintain sitting. No righting reaction in sitting                                   ADL either performed or assessed with clinical judgement   ADL Overall ADL's : Needs assistance/impaired                                       General ADL Comments: total care at this time. Pt does try to blow his nose into his mitten but unabel to raise arms fully to face due to weakness     Vision       Perception     Praxis      Cognition Arousal/Alertness: Lethargic Behavior During Therapy: Flat affect Overall Cognitive Status: Difficult to assess                 [place] Levels of Cognitive Functioning [place] [employer] Scales of Cognitive Functioning: Generalized response               General Comments: Incomprehensible speech during session with one instance of patient stating "let me go" when seated EOB. Kept eyes closed majority of session. when given noxious stimuli pt states "ouch" appropriately        Exercises     Shoulder Instructions  General Comments VSS    Pertinent Vitals/ Pain       Pain Assessment: Faces Faces Pain Scale: Hurts a little bit Pain Location: upper trap with noxious stimuli Pain Descriptors / Indicators: Grimacing Pain Intervention(s): Monitored during session;Repositioned  Home Living                                          Prior Functioning/Environment              Frequency  Min 2X/week        Progress Toward Goals  OT Goals(current goals can now be found in the care plan section)  Progress towards OT goals: Progressing toward goals  Acute Rehab OT Goals Patient Stated Goal: none stated  Plan Discharge plan remains appropriate     Co-evaluation    PT/OT/SLP Co-Evaluation/Treatment: Yes Reason for Co-Treatment: Complexity of the patient's impairments (multi-system involvement);Necessary to address cognition/behavior during functional activity;For patient/therapist safety;To address functional/ADL transfers PT goals addressed during session: Mobility/safety with mobility;Balance OT goals addressed during session: ADL's and self-care;Proper use of Adaptive equipment and DME;Strengthening/ROM      AM-PAC OT "6 Clicks" Daily Activity     Outcome Measure   Help from another person eating meals?: Total Help from another person taking care of personal grooming?: Total Help from another person toileting, which includes using toliet, bedpan, or urinal?: Total Help from another person bathing (including washing, rinsing, drying)?: Total Help from another person to put on and taking off regular upper body clothing?: Total Help from another person to put on and taking off regular lower body clothing?: Total 6 Click Score: 6    End of Session    OT Visit Diagnosis: Unsteadiness on feet (R26.81);Muscle weakness (generalized) (M62.81);Pain   Activity Tolerance Patient tolerated treatment well   Patient Left in bed;with call bell/phone within reach;with bed alarm set;with restraints reapplied;with SCD's reapplied   Nurse Communication Mobility status;Precautions        Time: 1194-1740 OT Time Calculation (min): 17 min  Charges: OT General Charges $OT Visit: 1 Visit   Fleeta Emmer, OTR/L  Acute Rehabilitation Services Pager: [phone] Office: [phone] .   Jeri Modena 06/19/2021, 2:13 PM

## 2021-06-19 NOTE — Progress Notes (Signed)
SLP Cancellation Note  Patient Details Name: Randall Harvey. MRN: [id number] DOB: 04/11/1961   Cancelled treatment:        Pt seen earlier this morning. Max cues given however unsafe to consume po's for swallow assessment. Will continue efforts.    [name] 06/19/2021, 4:53 PM
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
## 2021-06-19 NOTE — Procedures (Signed)
Cortrak  Person Inserting Tube:  Maylon Peppers C, RD Tube Type:  Cortrak - 43 inches Tube Size:  10 Tube Location:  Left nare Initial Placement:  Stomach Secured by: Bridle Technique Used to Measure Tube Placement:  Marking at nare/corner of mouth Cortrak Secured At:  63 cm  Cortrak Tube Team Note:  Consult received to place a Cortrak feeding tube.   X-ray is required, abdominal x-ray has been ordered by the Cortrak team. Please confirm tube placement before using the Cortrak tube.   If the tube becomes dislodged please keep the tube and contact the Cortrak team at [email or web address] ([email or web address]) for replacement.  If after hours and replacement cannot be delayed, place a NG tube and confirm placement with an abdominal x-ray.    Lockie Pares., RD, LDN, CNSC See AMiON for contact information

## 2021-06-19 NOTE — Progress Notes (Addendum)
Patient ID: Randall Harvey., male   DOB: [birth year], 60 y.o.   MRN: [id number]  [facility] Progress Note     Subjective: CC-  Snoring. Does not verbalize.  Objective: Vital signs in last 24 hours: Temp:  [98.1 F (36.7 C)-98.8 F (37.1 C)] 98.8 F (37.1 C) (09/21 0755) Pulse Rate:  [86-100] 96 (09/21 0755) Resp:  [17-20] 17 (09/21 0755) BP: (109-161)/(77-95) 161/88 (09/21 0755) SpO2:  [93 %-97 %] 97 % (09/21 0755) Last BM Date:  (PTA)  Intake/Output from previous day: 09/20 0701 - 09/21 0700 In: 759 [I.V.:659; IV Piggyback:100] Out: 675 [Urine:675] Intake/Output this shift: No intake/output data recorded.  PE: Gen:  drowsy, NAD HEENT: right periorbital ecchymosis. PERRL. Dried blood in left ear canal  Card:  RRR, 2+ DP pulses Pulm:  CTAB, no W/R/R, rate and effort normal Abd: Soft, NT/ND Ext:  calves soft and nontender Psych: unable to assess Neuro: moves all 4 extremities but does not follow commands  Lab Results:  Recent Labs    06/18/21 0047 06/19/21 0535  WBC 9.8 8.8  HGB 16.0 15.6  HCT 46.1 45.1  PLT 147* 163   BMET Recent Labs    06/18/21 0047 06/19/21 0535  NA 134* 139  K 4.0 4.1  CL 99 103  CO2 21* 24  GLUCOSE 100* 100*  BUN 7* 8  CREATININE 0.77 0.90  CALCIUM 8.7* 8.8*   PT/INR No results for input(s): LABPROT, INR in the last 72 hours. CMP     Component Value Date/Time   NA 139 06/19/2021 0535   K 4.1 06/19/2021 0535   CL 103 06/19/2021 0535   CO2 24 06/19/2021 0535   GLUCOSE 100 (H) 06/19/2021 0535   BUN 8 06/19/2021 0535   CREATININE 0.90 06/19/2021 0535   CALCIUM 8.8 (L) 06/19/2021 0535   PROT 6.5 06/15/2021 2240   ALBUMIN 3.6 06/15/2021 2240   AST 38 06/15/2021 2240   ALT 37 06/15/2021 2240   ALKPHOS 52 06/15/2021 2240   BILITOT 0.7 06/15/2021 2240   GFRNONAA >60 06/19/2021 0535   Lipase  No results found for: LIPASE     Studies/Results: No results found.  Anti-infectives: Anti-infectives (From  admission, onward)    None        Assessment/Plan 60 y.o. male found down with blood draining from left ear 9/17.   Subarachnoid hemorrhage, intraventricular hemorrhage, parenchymal contusion - Per Dr. Kathyrn Sheriff, repeat [facility] 9/18 w/ progression of SAH. No progression of intraventricular hemorrhage. Keppra 500 mg bid x7 days started 9/18. F/u PRN in NS clinic Skull fracture - per NSGY Nondisplaced facial fractures - per Dr. Claudia Desanctis, nonop, head of bed elevation to help with swelling, follow up outpatient Intoxication - Alc 186 on admission. Continue CIWA  FEN - NPO given mental status, IVF. Cortrak ordered, will start TF once placed VTE - SCDs, start lovenox today (ok with NSGY) ID - None  Dispo - TBI team therapies, currently recommending SNF vs 24 hours supervision.  I called and updated the patient's son.   LOS: 4 days    [facility] 06/19/2021, 10:35 AM Please see Amion for pager number during day hours 7:00am-4:30pm
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
## 2021-06-20 ENCOUNTER — Inpatient Hospital Stay (HOSPITAL_COMMUNITY): Payer: Medicare Other

## 2021-06-20 DIAGNOSIS — E440 Moderate protein-calorie malnutrition: Secondary | ICD-10-CM | POA: Insufficient documentation

## 2021-06-20 LAB — POCT I-STAT 7, (LYTES, BLD GAS, ICA,H+H)
Acid-Base Excess: 0 mmol/L (ref 0.0–2.0)
Bicarbonate: 26.4 mmol/L (ref 20.0–28.0)
Calcium, Ion: 1.27 mmol/L (ref 1.15–1.40)
HCT: 44 % (ref 39.0–52.0)
Hemoglobin: 15 g/dL (ref 13.0–17.0)
O2 Saturation: 100 %
Potassium: 3.9 mmol/L (ref 3.5–5.1)
Sodium: 137 mmol/L (ref 135–145)
TCO2: 28 mmol/L (ref 22–32)
pCO2 arterial: 49.8 mmHg — ABNORMAL HIGH (ref 32.0–48.0)
pH, Arterial: 7.332 — ABNORMAL LOW (ref 7.350–7.450)
pO2, Arterial: 186 mmHg — ABNORMAL HIGH (ref 83.0–108.0)

## 2021-06-20 LAB — GLUCOSE, CAPILLARY
Glucose-Capillary: 138 mg/dL — ABNORMAL HIGH (ref 70–99)
Glucose-Capillary: 138 mg/dL — ABNORMAL HIGH (ref 70–99)
Glucose-Capillary: 145 mg/dL — ABNORMAL HIGH (ref 70–99)
Glucose-Capillary: 148 mg/dL — ABNORMAL HIGH (ref 70–99)
Glucose-Capillary: 158 mg/dL — ABNORMAL HIGH (ref 70–99)
Glucose-Capillary: 176 mg/dL — ABNORMAL HIGH (ref 70–99)

## 2021-06-20 LAB — BASIC METABOLIC PANEL
Anion gap: 8 (ref 5–15)
BUN: 9 mg/dL (ref 8–23)
CO2: 25 mmol/L (ref 22–32)
Calcium: 8.7 mg/dL — ABNORMAL LOW (ref 8.9–10.3)
Chloride: 103 mmol/L (ref 98–111)
Creatinine, Ser: 0.85 mg/dL (ref 0.61–1.24)
GFR, Estimated: >60 mL/min (ref >60)
Glucose, Bld: 132 mg/dL — ABNORMAL HIGH (ref 70–99)
Potassium: 3.7 mmol/L (ref 3.5–5.1)
Sodium: 136 mmol/L (ref 135–145)

## 2021-06-20 LAB — CBC
HCT: 43.4 % (ref 39.0–52.0)
Hemoglobin: 14.9 g/dL (ref 13.0–17.0)
MCH: 33.3 pg (ref 26.0–34.0)
MCHC: 34.3 g/dL (ref 30.0–36.0)
MCV: 96.9 fL (ref 80.0–100.0)
Platelets: 175 10*3/uL (ref 150–400)
RBC: 4.48 MIL/uL (ref 4.22–5.81)
RDW: 11.3 % — ABNORMAL LOW (ref 11.5–15.5)
WBC: 9.5 10*3/uL (ref 4.0–10.5)
nRBC: 0 % (ref 0.0–0.2)

## 2021-06-20 LAB — PHOSPHORUS
Phosphorus: 4.2 mg/dL (ref 2.5–4.6)
Phosphorus: 4.4 mg/dL (ref 2.5–4.6)

## 2021-06-20 LAB — MAGNESIUM
Magnesium: 2.2 mg/dL (ref 1.7–2.4)
Magnesium: 2.1 mg/dL (ref 1.7–2.4)

## 2021-06-20 MED ORDER — ROCURONIUM BROMIDE 10 MG/ML (PF) SYRINGE
PREFILLED_SYRINGE | INTRAVENOUS | Status: AC
  Filled 2021-06-20: qty 10

## 2021-06-20 MED ORDER — DOCUSATE SODIUM 50 MG/5ML PO LIQD: 100.0000 mg | Freq: Two times a day (BID) | ORAL | Status: DC

## 2021-06-20 MED ORDER — GUAIFENESIN 100 MG/5ML PO SOLN
10.0000 mL | Freq: Four times a day (QID) | ORAL | Status: DC
  Administered 2021-06-20 – 2021-06-21 (×4): 200 mg
  Filled 2021-06-20: qty 30
  Filled 2021-06-20 (×2): qty 20
  Filled 2021-06-20: qty 30

## 2021-06-20 MED ORDER — ORAL CARE MOUTH RINSE
15.0000 mL | OROMUCOSAL | Status: DC
  Administered 2021-06-20 – 2021-07-02 (×124): 15 mL via OROMUCOSAL

## 2021-06-20 MED ORDER — SODIUM CHLORIDE 0.9 % IV BOLUS
1000.0000 mL | Freq: Once | INTRAVENOUS | Status: AC
  Administered 2021-06-20: 1000 mL via INTRAVENOUS

## 2021-06-20 MED ORDER — CHLORHEXIDINE GLUCONATE 0.12% ORAL RINSE (MEDLINE KIT)
15.0000 mL | Freq: Two times a day (BID) | OROMUCOSAL | Status: DC
  Administered 2021-06-20 – 2021-07-02 (×26): 15 mL via OROMUCOSAL

## 2021-06-20 MED ORDER — FENTANYL BOLUS VIA INFUSION
50.0000 ug | INTRAVENOUS | Status: DC | PRN
  Administered 2021-06-20: 25 ug via INTRAVENOUS
  Administered 2021-06-21: 50 ug via INTRAVENOUS
  Administered 2021-06-21 (×2): 100 ug via INTRAVENOUS
  Administered 2021-06-21: 50 ug via INTRAVENOUS
  Administered 2021-06-21 – 2021-06-23 (×6): 100 ug via INTRAVENOUS
  Filled 2021-06-20: qty 100

## 2021-06-20 MED ORDER — CHLORHEXIDINE GLUCONATE CLOTH 2 % EX PADS
6.0000 | MEDICATED_PAD | Freq: Every day | CUTANEOUS | Status: DC
  Administered 2021-06-20 – 2021-07-19 (×28): 6 via TOPICAL

## 2021-06-20 MED ORDER — PROPOFOL 1000 MG/100ML IV EMUL
0.0000 ug/kg/min | INTRAVENOUS | Status: DC
  Administered 2021-06-20: 20 ug/kg/min via INTRAVENOUS
  Administered 2021-06-21: 40 ug/kg/min via INTRAVENOUS
  Administered 2021-06-21: 10 ug/kg/min via INTRAVENOUS
  Administered 2021-06-22: 30 ug/kg/min via INTRAVENOUS
  Administered 2021-06-22 (×2): 35 ug/kg/min via INTRAVENOUS
  Administered 2021-06-23: 40 ug/kg/min via INTRAVENOUS
  Administered 2021-06-23 (×2): 50 ug/kg/min via INTRAVENOUS
  Administered 2021-06-23 – 2021-06-24 (×2): 40 ug/kg/min via INTRAVENOUS
  Administered 2021-06-24 (×2): 50 ug/kg/min via INTRAVENOUS
  Administered 2021-06-24: 40 ug/kg/min via INTRAVENOUS
  Administered 2021-06-24: 30 ug/kg/min via INTRAVENOUS
  Administered 2021-06-25: 40 ug/kg/min via INTRAVENOUS
  Administered 2021-06-25: 30 ug/kg/min via INTRAVENOUS
  Administered 2021-06-26: 5 ug/kg/min via INTRAVENOUS
  Filled 2021-06-20 (×4): qty 100
  Filled 2021-06-20: qty 200
  Filled 2021-06-20: qty 100
  Filled 2021-06-20: qty 200
  Filled 2021-06-20 (×7): qty 100
  Filled 2021-06-20: qty 200
  Filled 2021-06-20 (×4): qty 100

## 2021-06-20 MED ORDER — FENTANYL 2500MCG IN NS 250ML (10MCG/ML) PREMIX INFUSION
50.0000 ug/h | INTRAVENOUS | Status: DC
  Administered 2021-06-20 – 2021-06-21 (×2): 100 ug/h via INTRAVENOUS
  Administered 2021-06-22: 125 ug/h via INTRAVENOUS
  Administered 2021-06-23: 75 ug/h via INTRAVENOUS
  Administered 2021-06-24: 100 ug/h via INTRAVENOUS
  Administered 2021-06-25 – 2021-06-26 (×2): 75 ug/h via INTRAVENOUS
  Administered 2021-06-27 – 2021-06-28 (×2): 150 ug/h via INTRAVENOUS
  Administered 2021-06-28 – 2021-07-01 (×6): 200 ug/h via INTRAVENOUS
  Administered 2021-07-01: 150 ug/h via INTRAVENOUS
  Administered 2021-07-02: 200 ug/h via INTRAVENOUS
  Filled 2021-06-20 (×17): qty 250

## 2021-06-20 MED ORDER — ROCURONIUM BROMIDE 50 MG/5ML IV SOLN
100.0000 mg | Freq: Once | INTRAVENOUS | Status: AC
  Administered 2021-06-20: 100 mg via INTRAVENOUS
  Filled 2021-06-20: qty 10

## 2021-06-20 MED ORDER — FENTANYL CITRATE PF 50 MCG/ML IJ SOSY
50.0000 ug | PREFILLED_SYRINGE | Freq: Once | INTRAMUSCULAR | Status: DC
  Filled 2021-06-20: qty 1

## 2021-06-20 MED ORDER — POLYETHYLENE GLYCOL 3350 17 G PO PACK
17.0000 g | PACK | Freq: Every day | ORAL | Status: DC
  Administered 2021-06-21 – 2021-06-23 (×3): 17 g
  Filled 2021-06-20 (×5): qty 1

## 2021-06-20 MED ORDER — ETOMIDATE 2 MG/ML IV SOLN
20.0000 mg | Freq: Once | INTRAVENOUS | Status: AC
  Administered 2021-06-20: 20 mg via INTRAVENOUS
  Filled 2021-06-20: qty 10

## 2021-06-20 NOTE — Progress Notes (Signed)
SLP Cancellation Note  Patient Details Name: Randall Harvey. MRN: [id number] DOB: 03/06/1961   Cancelled treatment:       Reason Eval/Treat Not Completed: Medical issues which prohibited therapy/pt intubated.  Will sign off and await new orders when appropriate.   L. Tivis Ringer, [place] Office number [phone] Pager [phone]    Juan Quam Laurice 06/20/2021, 11:44 AM

## 2021-06-20 NOTE — Progress Notes (Addendum)
Patient ID: Randall Harvey., male   DOB: 11/10/1960, 60 y.o.   MRN: [id number]  [facility] Progress Note     Subjective: CC-  Snoring. Does not verbalize. Grunts to sternal rub.  Objective: Vital signs in last 24 hours: Temp:  [98.5 F (36.9 C)-100.4 F (38 C)] 99.1 F (37.3 C) (09/22 0750) Pulse Rate:  [62-114] 79 (09/22 0750) Resp:  [18-20] 20 (09/22 0750) BP: (122-148)/(67-99) 122/67 (09/22 0750) SpO2:  [90 %-97 %] 94 % (09/22 0750) Weight:  [74.7 kg] 74.7 kg (09/22 0500) Last BM Date:  (PTA)  Intake/Output from previous day: 09/21 0701 - 09/22 0700 In: 1509.4 [I.V.:1374.7; IV Piggyback:134.7] Out: 750 [Urine:750] Intake/Output this shift: Total I/O In: -  Out: 200 [Urine:200]  PE: Gen:  drowsy, NAD HEENT: right periorbital ecchymosis. PERRL. Dried blood in left ear canal  Card:  RRR, 2+ DP pulses Pulm:  CTAB, no W/R/R, rate and effort normal Abd: Soft, NT/ND Ext:  calves soft and nontender Psych: unable to assess Neuro: moves all 4 extremities but does not follow commands  Lab Results:  Recent Labs    06/19/21 0535 06/20/21 0425  WBC 8.8 9.5  HGB 15.6 14.9  HCT 45.1 43.4  PLT 163 175   BMET Recent Labs    06/19/21 0535 06/20/21 0425  NA 139 136  K 4.1 3.7  CL 103 103  CO2 24 25  GLUCOSE 100* 132*  BUN 8 9  CREATININE 0.90 0.85  CALCIUM 8.8* 8.7*   PT/INR No results for input(s): LABPROT, INR in the last 72 hours. CMP     Component Value Date/Time   NA 136 06/20/2021 0425   K 3.7 06/20/2021 0425   CL 103 06/20/2021 0425   CO2 25 06/20/2021 0425   GLUCOSE 132 (H) 06/20/2021 0425   BUN 9 06/20/2021 0425   CREATININE 0.85 06/20/2021 0425   CALCIUM 8.7 (L) 06/20/2021 0425   PROT 6.5 06/15/2021 2240   ALBUMIN 3.6 06/15/2021 2240   AST 38 06/15/2021 2240   ALT 37 06/15/2021 2240   ALKPHOS 52 06/15/2021 2240   BILITOT 0.7 06/15/2021 2240   GFRNONAA >60 06/20/2021 0425   Lipase  No results found for:  LIPASE     Studies/Results: DG Abd Portable 1V  Result Date: 06/19/2021 CLINICAL DATA:  Feeding tube placement EXAM: PORTABLE ABDOMEN - 1 VIEW COMPARISON:  06/15/2021 FINDINGS: Limited radiograph of the lower chest and upper abdomen was obtained for the purposes of enteric tube localization. Enteric tube is seen coursing below the diaphragm with distal tip terminating within the expected location of the gastric body. Visualized bowel gas pattern appears nonobstructive. IMPRESSION: Enteric tube tip terminates within the expected location of the gastric body. Electronically Signed   By: Davina Poke D.O.   On: 06/19/2021 13:21    Anti-infectives: Anti-infectives (From admission, onward)    None        Assessment/Plan 60 y.o. male found down with blood draining from left ear 9/17.   Subarachnoid hemorrhage, intraventricular hemorrhage, parenchymal contusion - Per Dr. Kathyrn Sheriff, repeat [facility] 9/18 w/ progression of SAH. No progression of intraventricular hemorrhage. Keppra 500 mg bid x7 days started 9/18. F/u PRN in NS clinic Skull fracture - per NSGY Nondisplaced facial fractures - per Dr. Claudia Desanctis, nonop, head of bed elevation to help with swelling, follow up outpatient Intoxication - Alc 186 on admission. Continue CIWA  FEN - NPO given mental status, IVF. Cortrak 9/21, gradually advancing TF to goal 55cc/hr  VTE - SCDs, Lovenox (started 9/21) ID - None  Dispo - TBI team therapies, currently recommending SNF vs 24 hours supervision.  Coarse upper airway sounds today, increased need for suctioning overnight by RN. TMAX 100.4. start mucinex and check CXR. WBC WNL.   LOS: 5 days    [facility] 06/20/2021, 10:15 AM Please see Amion for pager number during day hours 7:00am-4:30pm

## 2021-06-20 NOTE — Procedures (Addendum)
Intubation Procedure Note  Randall Harvey  [id number]  17-Jul-1961  Date:06/20/21  Time:11:38 AM   Provider Performing: Jesusita Oka    Procedure: Intubation (31500)  Indication(s) Respiratory Failure  Consent Risks of the procedure as well as the alternatives and risks of each were explained to the patient and/or caregiver.  Consent for the procedure was obtained and is signed in the bedside chart   Anesthesia Etomidate and Rocuronium   Time Out Verified patient identification, verified procedure, site/side was marked, verified correct patient position, special equipment/implants available, medications/allergies/relevant history reviewed, required imaging and test results available.   Sterile Technique Usual hand hygeine, masks, and gloves were used   Procedure Description Patient positioned in bed supine.  Sedation given as noted above.  Patient was intubated with endotracheal tube using Glidescope.  View was Grade 1 full glottis .  Number of attempts was  2 .  Colorimetric CO2 detector was consistent with tracheal placement. S4 blade used. Tube size 7.5, 23 at the teeth.    Complications/Tolerance None; patient tolerated the procedure well. Chest X-ray is ordered to verify placement.   EBL none   Specimen(s) None

## 2021-06-20 NOTE — Progress Notes (Signed)
PT Cancellation Note  Patient Details Name: Randall Harvey. MRN: [id number] DOB: 01/23/1961   Cancelled Treatment:    Reason Eval/Treat Not Completed: Medical issues which prohibited therapy Per RN, plan to intubate and transfer to 4N. PT will hold and re-attempt at later date.    A. Gilford Rile PT, DPT Acute Rehabilitation Services Pager [phone] Office [phone]    Linna Hoff 06/20/2021, 10:46 AM

## 2021-06-20 NOTE — Progress Notes (Signed)
Pt transferred from 4NP03 to 4NICU32.   Per nightshift RN, pt became more congested overnight and began gurgling, requiring suction. During bedside handoff at 0700 this am, pt's upper airway was clear. Later in the morning, the pt began gurgling again, was suctioned, and appeared to have lost his gag reflex. Pt's vitals remained WNL during all of this. Pt also became less reflex, does not open his eyes or respond to pain. Melina Modena, PA, came to bedside. See new orders. Santiago Glad, TRN also came to bedside, assessed for a gag reflex as well, and asked Dr. Bobbye Morton to come to the room. I also called Natalie, RT, who came to the room, assessed pt, brought Dr. Bobbye Morton to the bedside, and the decision was made to intubate and transfer to the ICU. Report was given to Merry Proud, Therapist, sports. Santiago Glad, TRN, and this RN transferred pt.   Justice Rocher, RN

## 2021-06-21 LAB — BASIC METABOLIC PANEL
Anion gap: 6 (ref 5–15)
BUN: 15 mg/dL (ref 8–23)
CO2: 23 mmol/L (ref 22–32)
Calcium: 8.2 mg/dL — ABNORMAL LOW (ref 8.9–10.3)
Chloride: 105 mmol/L (ref 98–111)
Creatinine, Ser: 0.7 mg/dL (ref 0.61–1.24)
GFR, Estimated: >60 mL/min (ref >60)
Glucose, Bld: 131 mg/dL — ABNORMAL HIGH (ref 70–99)
Potassium: 4 mmol/L (ref 3.5–5.1)
Sodium: 134 mmol/L — ABNORMAL LOW (ref 135–145)

## 2021-06-21 LAB — CBC
HCT: 41 % (ref 39.0–52.0)
Hemoglobin: 13.3 g/dL (ref 13.0–17.0)
MCH: 32.6 pg (ref 26.0–34.0)
MCHC: 32.4 g/dL (ref 30.0–36.0)
MCV: 100.5 fL — ABNORMAL HIGH (ref 80.0–100.0)
Platelets: 160 10*3/uL (ref 150–400)
RBC: 4.08 MIL/uL — ABNORMAL LOW (ref 4.22–5.81)
RDW: 11.5 % (ref 11.5–15.5)
WBC: 9.6 10*3/uL (ref 4.0–10.5)
nRBC: 0 % (ref 0.0–0.2)

## 2021-06-21 LAB — GLUCOSE, CAPILLARY
Glucose-Capillary: 126 mg/dL — ABNORMAL HIGH (ref 70–99)
Glucose-Capillary: 131 mg/dL — ABNORMAL HIGH (ref 70–99)
Glucose-Capillary: 138 mg/dL — ABNORMAL HIGH (ref 70–99)
Glucose-Capillary: 151 mg/dL — ABNORMAL HIGH (ref 70–99)
Glucose-Capillary: 163 mg/dL — ABNORMAL HIGH (ref 70–99)
Glucose-Capillary: 167 mg/dL — ABNORMAL HIGH (ref 70–99)

## 2021-06-21 LAB — MAGNESIUM: Magnesium: 1.9 mg/dL (ref 1.7–2.4)

## 2021-06-21 LAB — TRIGLYCERIDES: Triglycerides: 85 mg/dL (ref <150)

## 2021-06-21 LAB — PHOSPHORUS: Phosphorus: 3.8 mg/dL (ref 2.5–4.6)

## 2021-06-21 MED ORDER — METHOCARBAMOL 500 MG PO TABS
1000.0000 mg | ORAL_TABLET | Freq: Three times a day (TID) | ORAL | Status: DC
  Administered 2021-06-21 – 2021-07-11 (×51): 1000 mg
  Filled 2021-06-21 (×51): qty 2

## 2021-06-21 MED ORDER — METHOCARBAMOL 500 MG PO TABS
1000.0000 mg | ORAL_TABLET | Freq: Three times a day (TID) | ORAL | Status: DC
  Administered 2021-06-21: 1000 mg via ORAL
  Filled 2021-06-21 (×2): qty 2

## 2021-06-21 MED ORDER — GUAIFENESIN 100 MG/5ML PO SOLN
15.0000 mL | ORAL | Status: DC
  Administered 2021-06-21 – 2021-06-25 (×24): 300 mg
  Filled 2021-06-21 (×12): qty 15
  Filled 2021-06-21: qty 45
  Filled 2021-06-21 (×9): qty 15

## 2021-06-21 NOTE — Progress Notes (Signed)
PT Cancellation Note  Patient Details Name: Randall Harvey. MRN: [id number] DOB: January 08, 1961   Cancelled Treatment:    Reason Eval/Treat Not Completed: Medical issues which prohibited therapy Per Dr. Bobbye Morton, hold therapy this date. PT will re-attempt at later date when medically ready.     A. Gilford Rile PT, DPT Acute Rehabilitation Services Pager [phone] Office [phone]    Linna Hoff 06/21/2021, 8:48 AM

## 2021-06-21 NOTE — Progress Notes (Signed)
Trauma/Critical Care Follow Up Note  Subjective:    Overnight Issues:   Objective:  Vital signs for last 24 hours: Temp:  [98.3 F (36.8 C)-99.8 F (37.7 C)] 98.3 F (36.8 C) (09/23 0800) Pulse Rate:  [67-124] 72 (09/23 0830) Resp:  [13-25] 16 (09/23 0830) BP: (74-166)/(50-115) 122/66 (09/23 0830) SpO2:  [91 %-100 %] 97 % (09/23 0830) FiO2 (%):  [40 %-100 %] 40 % (09/23 0730) Weight:  [74.9 kg] 74.9 kg (09/23 0141)  Hemodynamic parameters for last 24 hours:    Intake/Output from previous day: 09/22 0701 - 09/23 0700 In: 4128.8 [I.V.:1961; NG/GT:868.1; IV Piggyback:1299.7] Out: 950 [Urine:950]  Intake/Output this shift: Total I/O In: 264.9 [I.V.:164.9; NG/GT:100] Out: -   Vent settings for last 24 hours: Vent Mode: PSV;CPAP FiO2 (%):  [40 %-100 %] 40 % Set Rate:  [16 bmp-18 bmp] 18 bmp Vt Set:  [560 mL] 560 mL PEEP:  [5 cmH20] 5 cmH20 Pressure Support:  [10 cmH20] 10 cmH20 Plateau Pressure:  [12 cmH20-16 cmH20] 16 cmH20  Physical Exam:  Gen: comfortable, no distress Neuro: not f/c HEENT: PERRL Neck: supple CV: RRR Pulm: unlabored breathing Abd: soft, NT GU: clear yellow urine Extr: wwp, no edema   Results for orders placed or performed during the hospital encounter of 06/15/21 (from the past 24 hour(s))  Glucose, capillary     Status: Abnormal   Collection Time: 06/20/21 10:59 AM  Result Value Ref Range   Glucose-Capillary 138 (H) 70 - 99 mg/dL  I-STAT 7, (LYTES, BLD GAS, ICA, H+H)     Status: Abnormal   Collection Time: 06/20/21 12:36 PM  Result Value Ref Range   pH, Arterial 7.332 (L) 7.350 - 7.450   pCO2 arterial 49.8 (H) 32.0 - 48.0 mmHg   pO2, Arterial 186 (H) 83.0 - 108.0 mmHg   Bicarbonate 26.4 20.0 - 28.0 mmol/L   TCO2 28 22 - 32 mmol/L   O2 Saturation 100.0 %   Acid-Base Excess 0.0 0.0 - 2.0 mmol/L   Sodium 137 135 - 145 mmol/L   Potassium 3.9 3.5 - 5.1 mmol/L   Calcium, Ion 1.27 1.15 - 1.40 mmol/L   HCT 44.0 39.0 - 52.0 %    Hemoglobin 15.0 13.0 - 17.0 g/dL   Sample type ARTERIAL   Glucose, capillary     Status: Abnormal   Collection Time: 06/20/21  3:50 PM  Result Value Ref Range   Glucose-Capillary 176 (H) 70 - 99 mg/dL  Magnesium     Status: None   Collection Time: 06/20/21  6:24 PM  Result Value Ref Range   Magnesium 2.1 1.7 - 2.4 mg/dL  Phosphorus     Status: None   Collection Time: 06/20/21  6:24 PM  Result Value Ref Range   Phosphorus 4.4 2.5 - 4.6 mg/dL  Glucose, capillary     Status: Abnormal   Collection Time: 06/20/21  7:45 PM  Result Value Ref Range   Glucose-Capillary 138 (H) 70 - 99 mg/dL  Glucose, capillary     Status: Abnormal   Collection Time: 06/20/21 11:34 PM  Result Value Ref Range   Glucose-Capillary 158 (H) 70 - 99 mg/dL  Glucose, capillary     Status: Abnormal   Collection Time: 06/21/21  3:40 AM  Result Value Ref Range   Glucose-Capillary 163 (H) 70 - 99 mg/dL  Magnesium     Status: None   Collection Time: 06/21/21  5:45 AM  Result Value Ref Range   Magnesium 1.9 1.7 -  2.4 mg/dL  Phosphorus     Status: None   Collection Time: 06/21/21  5:45 AM  Result Value Ref Range   Phosphorus 3.8 2.5 - 4.6 mg/dL  Triglycerides     Status: None   Collection Time: 06/21/21  5:45 AM  Result Value Ref Range   Triglycerides 85 <150 mg/dL  Glucose, capillary     Status: Abnormal   Collection Time: 06/21/21  8:24 AM  Result Value Ref Range   Glucose-Capillary 167 (H) 70 - 99 mg/dL    Assessment & Plan: The plan of care was discussed with the bedside nurse for the day, who is in agreement with this plan and no additional concerns were raised.   Present on Admission:  Skull fracture ([place])    LOS: 6 days   Additional comments:I reviewed the patient's new clinical lab test results.   and I reviewed the patients new imaging test results.    Found down   SAH, IVH, parenchymal contusion - NSGY c/s, Dr. Kathyrn Sheriff, repeat [facility] 9/18 w/ progression of SAH. No progression of  intraventricular hemorrhage. Keppra 500 mg bid x7 days started 9/18. F/u PRN in NS clinic Skull fracture - NSGY c/s, Dr. Kathyrn Sheriff Nondisplaced facial fractures - ENT c/s, Dr. Claudia Desanctis, nonop, head of bed elevation to help with swelling, follow up outpatient Alcohol abuse - 186 on admission. CIWA VDRF - intubated for airway protection 9/22. Incr guaifenisen for secretions, send resp cx FEN - NPO, d/c IVF. Cortrak 9/21, TF to goal 55cc/hr VTE - SCDs, Lovenox Dispo - ICU  Critical Care Total Time: 40 minutes  Jesusita Oka, MD Trauma & General Surgery Please use [email or web address] to contact on call provider  06/21/2021  *Care during the described time interval was provided by me. I have reviewed this patient's available data, including medical history, events of note, physical examination and test results as part of my evaluation.

## 2021-06-21 NOTE — Progress Notes (Signed)
Nutrition Follow-up  DOCUMENTATION CODES:   Non-severe (moderate) malnutrition in context of chronic illness  INTERVENTION:   Initiate tube feeds via Cortrak: - Pivot 1.5 @ 55 ml/hr (1320 ml/day)  Tube feeding regimen 1980 kcal, 124 grams of protein, and 1002 ml of H2O.   NUTRITION DIAGNOSIS:   Moderate Malnutrition related to chronic illness (COPD) as evidenced by moderate fat depletion, moderate muscle depletion. Ongoing  GOAL:   Patient will meet greater than or equal to 90% of their needs Met  MONITOR:   Diet advancement, Labs, Weight trends, TF tolerance, I & O's  REASON FOR ASSESSMENT:   Consult Enteral/tube feeding initiation and management, Assessment of nutrition requirement/status  ASSESSMENT:   60 year old male who presented to the ED on 9/17 after being found down with blood draining from L ear after unknown trauma. Pt was intoxicated. PMH of renal cell carcinoma, HTN, stroke, COPD. Pt found to have SAH, IVH, parenchymal contusion, skull fx, nondisplaced facial fx.   Plastic Surgery recommending nonoperative treatment of minimally displaced facial fractures. TF at goal. On meds for secretions after being intubated. No BM yet, miralax added   9/21 cortrak; tip gastric 9/22 intubated for airway protection    Medications reviewed and include: colace, folic acid, MVI, miralax, thiamine Fentanyl  Keppra   Labs reviewed: Na 134 CBG's: 123-167  UOP: 950 ml  I/O's: + 5.5 L since admit     Diet Order:   Diet Order             Diet NPO time specified  Diet effective now                   EDUCATION NEEDS:   Not appropriate for education at this time  Skin:  Skin Assessment: Reviewed RN Assessment  Last BM:  no documented BM  Height:   Ht Readings from Last 1 Encounters:  06/15/21 5' 9"  (1.753 m)    Weight:   Wt Readings from Last 1 Encounters:  06/21/21 74.9 kg    BMI:  Body mass index is 24.38 kg/m.  Estimated Nutritional  Needs:   Kcal:  2000-2200  Protein:  110-125 grams  Fluid:  >/= 2.0 L   P., RD, LDN, CNSC See AMiON for contact information

## 2021-06-21 NOTE — Progress Notes (Signed)
Notified Dr. Bobbye Morton that patient very agitated rolling over in bed, not following commands and attempting to pull out ET tube with safety mitts on. MD gave order to place bilateral soft wrist restraints.

## 2021-06-22 LAB — GLUCOSE, CAPILLARY
Glucose-Capillary: 119 mg/dL — ABNORMAL HIGH (ref 70–99)
Glucose-Capillary: 142 mg/dL — ABNORMAL HIGH (ref 70–99)
Glucose-Capillary: 159 mg/dL — ABNORMAL HIGH (ref 70–99)
Glucose-Capillary: 176 mg/dL — ABNORMAL HIGH (ref 70–99)
Glucose-Capillary: 180 mg/dL — ABNORMAL HIGH (ref 70–99)
Glucose-Capillary: 185 mg/dL — ABNORMAL HIGH (ref 70–99)

## 2021-06-22 LAB — BASIC METABOLIC PANEL
Anion gap: 7 (ref 5–15)
BUN: 18 mg/dL (ref 8–23)
CO2: 23 mmol/L (ref 22–32)
Calcium: 8.6 mg/dL — ABNORMAL LOW (ref 8.9–10.3)
Chloride: 105 mmol/L (ref 98–111)
Creatinine, Ser: 0.73 mg/dL (ref 0.61–1.24)
GFR, Estimated: >60 mL/min (ref >60)
Glucose, Bld: 153 mg/dL — ABNORMAL HIGH (ref 70–99)
Potassium: 3.8 mmol/L (ref 3.5–5.1)
Sodium: 135 mmol/L (ref 135–145)

## 2021-06-22 LAB — CBC
HCT: 38.6 % — ABNORMAL LOW (ref 39.0–52.0)
Hemoglobin: 12.9 g/dL — ABNORMAL LOW (ref 13.0–17.0)
MCH: 33.4 pg (ref 26.0–34.0)
MCHC: 33.4 g/dL (ref 30.0–36.0)
MCV: 100 fL (ref 80.0–100.0)
Platelets: 150 10*3/uL (ref 150–400)
RBC: 3.86 MIL/uL — ABNORMAL LOW (ref 4.22–5.81)
RDW: 11.4 % — ABNORMAL LOW (ref 11.5–15.5)
WBC: 8.5 10*3/uL (ref 4.0–10.5)
nRBC: 0 % (ref 0.0–0.2)

## 2021-06-22 MED ORDER — QUETIAPINE FUMARATE 25 MG PO TABS: 50.0000 mg | ORAL_TABLET | Freq: Two times a day (BID) | ORAL | Status: DC

## 2021-06-22 MED ORDER — PANTOPRAZOLE 2 MG/ML SUSPENSION
40.0000 mg | Freq: Every day | ORAL | Status: DC
  Administered 2021-06-22 – 2021-07-03 (×12): 40 mg
  Filled 2021-06-22 (×12): qty 20

## 2021-06-22 MED ORDER — SODIUM CHLORIDE 0.9 % IV SOLN
2.0000 g | Freq: Three times a day (TID) | INTRAVENOUS | Status: DC
  Administered 2021-06-22 – 2021-06-24 (×7): 2 g via INTRAVENOUS
  Filled 2021-06-22 (×7): qty 2

## 2021-06-22 MED ORDER — INSULIN ASPART 100 UNIT/ML IJ SOLN
0.0000 [IU] | INTRAMUSCULAR | Status: DC
  Administered 2021-06-22 – 2021-06-23 (×3): 3 [IU] via SUBCUTANEOUS
  Administered 2021-06-23 (×5): 2 [IU] via SUBCUTANEOUS
  Administered 2021-06-24 – 2021-06-25 (×7): 3 [IU] via SUBCUTANEOUS
  Administered 2021-06-25: 5 [IU] via SUBCUTANEOUS
  Administered 2021-06-25 (×2): 3 [IU] via SUBCUTANEOUS
  Administered 2021-06-25: 5 [IU] via SUBCUTANEOUS
  Administered 2021-06-25: 2 [IU] via SUBCUTANEOUS
  Administered 2021-06-26: 3 [IU] via SUBCUTANEOUS
  Administered 2021-06-26: 5 [IU] via SUBCUTANEOUS
  Administered 2021-06-26: 3 [IU] via SUBCUTANEOUS

## 2021-06-22 MED ORDER — CLONAZEPAM 0.5 MG PO TABS
0.5000 mg | ORAL_TABLET | Freq: Two times a day (BID) | ORAL | Status: DC
  Administered 2021-06-22 – 2021-06-26 (×9): 0.5 mg
  Filled 2021-06-22 (×9): qty 1

## 2021-06-22 MED ORDER — QUETIAPINE FUMARATE 25 MG PO TABS
50.0000 mg | ORAL_TABLET | Freq: Two times a day (BID) | ORAL | Status: DC
  Administered 2021-06-22 – 2021-06-24 (×5): 50 mg
  Filled 2021-06-22 (×5): qty 2

## 2021-06-22 NOTE — Progress Notes (Signed)
Trauma/Critical Care Follow Up Note  Subjective:    Overnight Issues:   Objective:  Vital signs for last 24 hours: Temp:  [98.4 F (36.9 C)-99 F (37.2 C)] 98.5 F (36.9 C) (09/24 0800) Pulse Rate:  [58-96] 76 (09/24 0900) Resp:  [9-23] 19 (09/24 0900) BP: (88-149)/(58-84) 105/63 (09/24 0900) SpO2:  [93 %-100 %] 93 % (09/24 0900) FiO2 (%):  [40 %] 40 % (09/24 0800) Weight:  [74.1 kg] 74.1 kg (09/24 0303)  Hemodynamic parameters for last 24 hours:    Intake/Output from previous day: 09/23 0701 - 09/24 0700 In: 2415 [I.V.:864; NA/TF:5732; IV Piggyback:200] Out: 915 [Urine:915]  Intake/Output this shift: No intake/output data recorded.  Vent settings for last 24 hours: Vent Mode: PRVC FiO2 (%):  [40 %] 40 % Set Rate:  [18 bmp] 18 bmp Vt Set:  [560 mL] 560 mL PEEP:  [5 cmH20] 5 cmH20 Pressure Support:  [5 cmH20] 5 cmH20 Plateau Pressure:  [11 cmH20-17 cmH20] 16 cmH20  Physical Exam:  Gen: comfortable, no distress Neuro: not f/c HEENT: PERRL Neck: supple CV: RRR Pulm: unlabored breathing Abd: soft, NT GU: clear yellow urine Extr: wwp, no edema   Results for orders placed or performed during the hospital encounter of 06/15/21 (from the past 24 hour(s))  Glucose, capillary     Status: Abnormal   Collection Time: 06/21/21 11:34 AM  Result Value Ref Range   Glucose-Capillary 126 (H) 70 - 99 mg/dL  CBC     Status: Abnormal   Collection Time: 06/21/21 12:08 PM  Result Value Ref Range   WBC 9.6 4.0 - 10.5 K/uL   RBC 4.08 (L) 4.22 - 5.81 MIL/uL   Hemoglobin 13.3 13.0 - 17.0 g/dL   HCT 41.0 39.0 - 52.0 %   MCV 100.5 (H) 80.0 - 100.0 fL   MCH 32.6 26.0 - 34.0 pg   MCHC 32.4 30.0 - 36.0 g/dL   RDW 11.5 11.5 - 15.5 %   Platelets 160 150 - 400 K/uL   nRBC 0.0 0.0 - 0.2 %  Basic metabolic panel     Status: Abnormal   Collection Time: 06/21/21 12:08 PM  Result Value Ref Range   Sodium 134 (L) 135 - 145 mmol/L   Potassium 4.0 3.5 - 5.1 mmol/L   Chloride 105 98  - 111 mmol/L   CO2 23 22 - 32 mmol/L   Glucose, Bld 131 (H) 70 - 99 mg/dL   BUN 15 8 - 23 mg/dL   Creatinine, Ser 0.70 0.61 - 1.24 mg/dL   Calcium 8.2 (L) 8.9 - 10.3 mg/dL   GFR, Estimated >60 >60 mL/min   Anion gap 6 5 - 15  Glucose, capillary     Status: Abnormal   Collection Time: 06/21/21  3:43 PM  Result Value Ref Range   Glucose-Capillary 131 (H) 70 - 99 mg/dL  Glucose, capillary     Status: Abnormal   Collection Time: 06/21/21  7:50 PM  Result Value Ref Range   Glucose-Capillary 138 (H) 70 - 99 mg/dL  Glucose, capillary     Status: Abnormal   Collection Time: 06/21/21 11:48 PM  Result Value Ref Range   Glucose-Capillary 151 (H) 70 - 99 mg/dL  CBC     Status: Abnormal   Collection Time: 06/22/21 12:38 AM  Result Value Ref Range   WBC 8.5 4.0 - 10.5 K/uL   RBC 3.86 (L) 4.22 - 5.81 MIL/uL   Hemoglobin 12.9 (L) 13.0 - 17.0 g/dL   HCT  38.6 (L) 39.0 - 52.0 %   MCV 100.0 80.0 - 100.0 fL   MCH 33.4 26.0 - 34.0 pg   MCHC 33.4 30.0 - 36.0 g/dL   RDW 11.4 (L) 11.5 - 15.5 %   Platelets 150 150 - 400 K/uL   nRBC 0.0 0.0 - 0.2 %  Basic metabolic panel     Status: Abnormal   Collection Time: 06/22/21 12:38 AM  Result Value Ref Range   Sodium 135 135 - 145 mmol/L   Potassium 3.8 3.5 - 5.1 mmol/L   Chloride 105 98 - 111 mmol/L   CO2 23 22 - 32 mmol/L   Glucose, Bld 153 (H) 70 - 99 mg/dL   BUN 18 8 - 23 mg/dL   Creatinine, Ser 0.73 0.61 - 1.24 mg/dL   Calcium 8.6 (L) 8.9 - 10.3 mg/dL   GFR, Estimated >60 >60 mL/min   Anion gap 7 5 - 15  Glucose, capillary     Status: Abnormal   Collection Time: 06/22/21  3:31 AM  Result Value Ref Range   Glucose-Capillary 185 (H) 70 - 99 mg/dL  Glucose, capillary     Status: Abnormal   Collection Time: 06/22/21  8:03 AM  Result Value Ref Range   Glucose-Capillary 180 (H) 70 - 99 mg/dL    Assessment & Plan: The plan of care was discussed with the bedside nurse for the day, who is in agreement with this plan and no additional concerns were  raised.   Present on Admission:  Skull fracture ([place])    LOS: 7 days   Additional comments:I reviewed the patient's new clinical lab test results.   and I reviewed the patients new imaging test results.    Found down   SAH, IVH, parenchymal contusion - NSGY c/s, Dr. Kathyrn Sheriff, repeat [facility] 9/18 w/ progression of SAH. No progression of intraventricular hemorrhage. Keppra 500 mg bid x7 days started 9/18. F/u PRN in NS clinic Skull fracture - NSGY c/s, Dr. Kathyrn Sheriff Nondisplaced facial fractures - ENT c/s, Dr. Claudia Desanctis, nonop, head of bed elevation to help with swelling, follow up outpatient Alcohol abuse - 186 on admission. CIWA VDRF - intubated for airway protection 9/22. Incr guaifenisen for secretions 9/23. Still not neurologically appropriate for extubation ID - resp cx 9/23 with GPCs and GNRs, start empiric cefepime await S&S FEN - NPO, Cortrak 9/21, TF to goal 55cc/hr VTE - SCDs, Lovenox Dispo - ICU  Critical Care Total Time: 40 minutes  Jesusita Oka, MD Trauma & General Surgery Please use [email or web address] to contact on call provider  06/22/2021  *Care during the described time interval was provided by me. I have reviewed this patient's available data, including medical history, events of note, physical examination and test results as part of my evaluation.

## 2021-06-22 NOTE — Progress Notes (Signed)
Pharmacy Antibiotic Note  Randall Rauf. is a 60 y.o. male admitted on 06/15/2021 after being found down intoxicated.  Patient was intubated and guaifenesin added for increased secretions.  Pharmacy has been consulted for cefepime dosing for possible PNA.  Renal function stable, afebrile, WBC WNL.  Plan: Cefepime 2gm IV Q8H Monitor renal fxn, micro data  Height: 5\' 9"  (175.3 cm) Weight: 74.1 kg (163 lb 5.8 oz) IBW/kg (Calculated) : 70.7  Temp (24hrs), Avg:98.7 F (37.1 C), Min:98.4 F (36.9 C), Max:99 F (37.2 C)  Recent Labs  Lab 06/15/21 2240 06/15/21 2251 06/18/21 0047 06/19/21 0535 06/20/21 0425 06/21/21 1208 06/22/21 0038  WBC 10.7*   < > 9.8 8.8 9.5 9.6 8.5  CREATININE 0.87   < > 0.77 0.90 0.85 0.70 0.73  LATICACIDVEN 2.1*  --   --   --   --   --   --    < > = values in this interval not displayed.    Estimated Creatinine Clearance: 95.7 mL/min (by C-G formula based on SCr of 0.73 mg/dL).    No Known Allergies  Cefepime 9/24 >>   9/23 TA - GPC / GNR on Gram stain   D. [name], PharmD, BCPS, [place] 06/22/2021, 9:56 AM

## 2021-06-22 NOTE — Progress Notes (Signed)
Called Dr. Bobbye Morton to bedside to assess patient. Wake up assessment performed. Patient briefly and intermittently followed simple commands but increasingly very agitated and will not follow commands at this point in time. Very red in face, sinus tach rate low 100's maintaining o2 sats. Dr. Bobbye Morton at bedside and instructed RN to re-sedate patient since he is so agitated. Large amounts of tan thick sputum from ETT suctioned several times. Tan secretions coming from mouth and nose as well. Sedation restarted and bolus given.

## 2021-06-23 ENCOUNTER — Inpatient Hospital Stay (HOSPITAL_COMMUNITY): Payer: Medicare Other

## 2021-06-23 LAB — CBC
HCT: 39.1 % (ref 39.0–52.0)
Hemoglobin: 12.8 g/dL — ABNORMAL LOW (ref 13.0–17.0)
MCH: 33.2 pg (ref 26.0–34.0)
MCHC: 32.7 g/dL (ref 30.0–36.0)
MCV: 101.6 fL — ABNORMAL HIGH (ref 80.0–100.0)
Platelets: 178 10*3/uL (ref 150–400)
RBC: 3.85 MIL/uL — ABNORMAL LOW (ref 4.22–5.81)
RDW: 11.6 % (ref 11.5–15.5)
WBC: 7.6 10*3/uL (ref 4.0–10.5)
nRBC: 0 % (ref 0.0–0.2)

## 2021-06-23 LAB — BASIC METABOLIC PANEL
Anion gap: 8 (ref 5–15)
BUN: 19 mg/dL (ref 8–23)
CO2: 21 mmol/L — ABNORMAL LOW (ref 22–32)
Calcium: 8.4 mg/dL — ABNORMAL LOW (ref 8.9–10.3)
Chloride: 105 mmol/L (ref 98–111)
Creatinine, Ser: 0.77 mg/dL (ref 0.61–1.24)
GFR, Estimated: >60 mL/min (ref >60)
Glucose, Bld: 160 mg/dL — ABNORMAL HIGH (ref 70–99)
Potassium: 3.9 mmol/L (ref 3.5–5.1)
Sodium: 134 mmol/L — ABNORMAL LOW (ref 135–145)

## 2021-06-23 LAB — GLUCOSE, CAPILLARY
Glucose-Capillary: 147 mg/dL — ABNORMAL HIGH (ref 70–99)
Glucose-Capillary: 159 mg/dL — ABNORMAL HIGH (ref 70–99)
Glucose-Capillary: 142 mg/dL — ABNORMAL HIGH (ref 70–99)
Glucose-Capillary: 142 mg/dL — ABNORMAL HIGH (ref 70–99)
Glucose-Capillary: 128 mg/dL — ABNORMAL HIGH (ref 70–99)

## 2021-06-23 MED ORDER — ADULT MULTIVITAMIN W/MINERALS CH
1.0000 | ORAL_TABLET | Freq: Every day | ORAL | Status: DC
  Administered 2021-06-24 – 2021-07-11 (×16): 1
  Filled 2021-06-23 (×17): qty 1

## 2021-06-23 MED ORDER — THIAMINE HCL 100 MG PO TABS
100.0000 mg | ORAL_TABLET | Freq: Every day | ORAL | Status: DC
  Administered 2021-06-24 – 2021-07-11 (×16): 100 mg
  Filled 2021-06-23 (×17): qty 1

## 2021-06-23 MED ORDER — FOLIC ACID 1 MG PO TABS
1.0000 mg | ORAL_TABLET | Freq: Every day | ORAL | Status: DC
  Administered 2021-06-24 – 2021-07-11 (×16): 1 mg
  Filled 2021-06-23 (×17): qty 1

## 2021-06-23 NOTE — Progress Notes (Signed)
Trauma/Critical Care Follow Up Note  Subjective:    Overnight Issues:   Objective:  Vital signs for last 24 hours: Temp:  [98.3 F (36.8 C)-98.9 F (37.2 C)] 98.3 F (36.8 C) (09/25 0400) Pulse Rate:  [59-105] 63 (09/25 0600) Resp:  [15-19] 18 (09/25 0600) BP: (89-149)/(53-80) 103/60 (09/25 0600) SpO2:  [90 %-100 %] 97 % (09/25 0600) FiO2 (%):  [40 %] 40 % (09/25 0437) Weight:  [74.1 kg] 74.1 kg (09/25 0412)  Hemodynamic parameters for last 24 hours:    Intake/Output from previous day: 09/24 0701 - 09/25 0700 In: 2547.4 [I.V.:657.4; NG/GT:1390; IV Piggyback:499.9] Out: 6712 [Urine:1065]  Intake/Output this shift: No intake/output data recorded.  Vent settings for last 24 hours: Vent Mode: PRVC FiO2 (%):  [40 %] 40 % Set Rate:  [18 bmp] 18 bmp Vt Set:  [560 mL] 560 mL PEEP:  [5 cmH20] 5 cmH20 Plateau Pressure:  [16 cmH20] 16 cmH20  Physical Exam:  Gen: comfortable, no distress Neuro: not f/c HEENT: PERRL Neck: supple CV: RRR Pulm: unlabored breathing Abd: soft, NT GU: clear yellow urine Extr: wwp, no edema   Results for orders placed or performed during the hospital encounter of 06/15/21 (from the past 24 hour(s))  Glucose, capillary     Status: Abnormal   Collection Time: 06/22/21  8:03 AM  Result Value Ref Range   Glucose-Capillary 180 (H) 70 - 99 mg/dL  Glucose, capillary     Status: Abnormal   Collection Time: 06/22/21 11:56 AM  Result Value Ref Range   Glucose-Capillary 176 (H) 70 - 99 mg/dL  Glucose, capillary     Status: Abnormal   Collection Time: 06/22/21  4:08 PM  Result Value Ref Range   Glucose-Capillary 159 (H) 70 - 99 mg/dL  Glucose, capillary     Status: Abnormal   Collection Time: 06/22/21  7:41 PM  Result Value Ref Range   Glucose-Capillary 119 (H) 70 - 99 mg/dL  Glucose, capillary     Status: Abnormal   Collection Time: 06/22/21 11:49 PM  Result Value Ref Range   Glucose-Capillary 142 (H) 70 - 99 mg/dL  Glucose, capillary      Status: Abnormal   Collection Time: 06/23/21  4:08 AM  Result Value Ref Range   Glucose-Capillary 147 (H) 70 - 99 mg/dL    Assessment & Plan: The plan of care was discussed with the bedside nurse for the night, who is in agreement with this plan and no additional concerns were raised.   Present on Admission:  Skull fracture ([place])    LOS: 8 days   Additional comments:I reviewed the patient's new clinical lab test results.   and I reviewed the patients new imaging test results.    Found down   SAH, IVH, parenchymal contusion - NSGY c/s, Dr. Kathyrn Sheriff, repeat [facility] 9/18 w/ progression of SAH. No progression of intraventricular hemorrhage. Keppra 500 mg bid x7 days started 9/18. F/u PRN in NS clinic Skull fracture - NSGY c/s, Dr. Kathyrn Sheriff Nondisplaced facial fractures - ENT c/s, Dr. Claudia Desanctis, nonop, head of bed elevation to help with swelling, follow up outpatient Alcohol abuse - 186 on admission. CIWA VDRF - intubated for airway protection 9/22. Incr guaifenisen for secretions 9/23. Still not neurologically appropriate for extubation ID - resp cx 9/23 with S pneumo, empiric cefepime 9/24, await S&S FEN - NPO, Cortrak 9/21, TF to goal 55cc/hr VTE - SCDs, Lovenox Dispo - ICU  Critical Care Total Time: 40 minutes  Randall Oka,  MD Trauma & General Surgery Please use [email or web address] to contact on call provider  06/23/2021  *Care during the described time interval was provided by me. I have reviewed this patient's available data, including medical history, events of note, physical examination and test results as part of my evaluation.

## 2021-06-23 NOTE — Progress Notes (Signed)
Notified Dr Barry Dienes pt now not moving LUE. Patient briskly moving all other extremities. Order received to obtain CT head.  Erling Conte, RN

## 2021-06-23 NOTE — Progress Notes (Signed)
Pt transported to CT and back without complications. 

## 2021-06-24 ENCOUNTER — Inpatient Hospital Stay (HOSPITAL_COMMUNITY): Payer: Medicare Other

## 2021-06-24 LAB — AMMONIA: Ammonia: 29 umol/L (ref 9–35)

## 2021-06-24 LAB — BASIC METABOLIC PANEL
Anion gap: 9 (ref 5–15)
BUN: 17 mg/dL (ref 8–23)
CO2: 21 mmol/L — ABNORMAL LOW (ref 22–32)
Calcium: 8.3 mg/dL — ABNORMAL LOW (ref 8.9–10.3)
Chloride: 106 mmol/L (ref 98–111)
Creatinine, Ser: 0.74 mg/dL (ref 0.61–1.24)
GFR, Estimated: >60 mL/min (ref >60)
Glucose, Bld: 215 mg/dL — ABNORMAL HIGH (ref 70–99)
Potassium: 4.1 mmol/L (ref 3.5–5.1)
Sodium: 136 mmol/L (ref 135–145)

## 2021-06-24 LAB — GLUCOSE, CAPILLARY
Glucose-Capillary: 151 mg/dL — ABNORMAL HIGH (ref 70–99)
Glucose-Capillary: 153 mg/dL — ABNORMAL HIGH (ref 70–99)
Glucose-Capillary: 155 mg/dL — ABNORMAL HIGH (ref 70–99)
Glucose-Capillary: 165 mg/dL — ABNORMAL HIGH (ref 70–99)
Glucose-Capillary: 172 mg/dL — ABNORMAL HIGH (ref 70–99)
Glucose-Capillary: 177 mg/dL — ABNORMAL HIGH (ref 70–99)
Glucose-Capillary: 181 mg/dL — ABNORMAL HIGH (ref 70–99)

## 2021-06-24 LAB — CBC
HCT: 38.9 % — ABNORMAL LOW (ref 39.0–52.0)
Hemoglobin: 12.2 g/dL — ABNORMAL LOW (ref 13.0–17.0)
MCH: 32.4 pg (ref 26.0–34.0)
MCHC: 31.4 g/dL (ref 30.0–36.0)
MCV: 103.5 fL — ABNORMAL HIGH (ref 80.0–100.0)
Platelets: 166 10*3/uL (ref 150–400)
RBC: 3.76 MIL/uL — ABNORMAL LOW (ref 4.22–5.81)
RDW: 11.6 % (ref 11.5–15.5)
WBC: 7.6 10*3/uL (ref 4.0–10.5)
nRBC: 0 % (ref 0.0–0.2)

## 2021-06-24 LAB — CULTURE, RESPIRATORY W GRAM STAIN

## 2021-06-24 LAB — TRIGLYCERIDES: Triglycerides: 89 mg/dL (ref <150)

## 2021-06-24 MED ORDER — QUETIAPINE FUMARATE 25 MG PO TABS
50.0000 mg | ORAL_TABLET | Freq: Once | ORAL | Status: DC
  Filled 2021-06-24: qty 2

## 2021-06-24 MED ORDER — QUETIAPINE FUMARATE 25 MG PO TABS
50.0000 mg | ORAL_TABLET | Freq: Once | ORAL | Status: AC
  Administered 2021-06-24: 50 mg

## 2021-06-24 MED ORDER — QUETIAPINE FUMARATE 100 MG PO TABS
100.0000 mg | ORAL_TABLET | Freq: Two times a day (BID) | ORAL | Status: DC
  Administered 2021-06-24 – 2021-07-01 (×14): 100 mg
  Filled 2021-06-24 (×14): qty 1

## 2021-06-24 MED ORDER — CEFAZOLIN SODIUM-DEXTROSE 2-4 GM/100ML-% IV SOLN
2.0000 g | Freq: Three times a day (TID) | INTRAVENOUS | Status: AC
  Administered 2021-06-24 – 2021-06-28 (×13): 2 g via INTRAVENOUS
  Filled 2021-06-24 (×13): qty 100

## 2021-06-24 NOTE — Progress Notes (Signed)
Trauma/Critical Care Follow Up Note  Subjective:    Overnight Issues:   Objective:  Vital signs for last 24 hours: Temp:  [98.7 F (37.1 C)-100.3 F (37.9 C)] 99 F (37.2 C) (09/26 0800) Pulse Rate:  [58-77] 61 (09/26 1100) Resp:  [16-22] 18 (09/26 1100) BP: (104-148)/(59-76) 109/66 (09/26 1100) SpO2:  [96 %-100 %] 98 % (09/26 1100) FiO2 (%):  [40 %] 40 % (09/26 1054) Weight:  [78.8 kg] 78.8 kg (09/26 0400)  Hemodynamic parameters for last 24 hours:    Intake/Output from previous day: 09/25 0701 - 09/26 0700 In: 2653.3 [I.V.:683.2; NG/GT:1670; IV Piggyback:300.1] Out: 1375 [Urine:1375]  Intake/Output this shift: Total I/O In: 438.9 [I.V.:118.9; NG/GT:220; IV Piggyback:100] Out: -   Vent settings for last 24 hours: Vent Mode: PRVC FiO2 (%):  [40 %] 40 % Set Rate:  [18 bmp] 18 bmp Vt Set:  [560 mL] 560 mL PEEP:  [5 cmH20] 5 cmH20 Plateau Pressure:  [15 cmH20-18 cmH20] 15 cmH20  Physical Exam:  Gen: comfortable, no distress Neuro: not f/c HEENT: PERRL Neck: supple CV: RRR Pulm: unlabored breathing Abd: soft, NT GU: clear yellow urine Extr: wwp, no edema   Results for orders placed or performed during the hospital encounter of 06/15/21 (from the past 24 hour(s))  Glucose, capillary     Status: Abnormal   Collection Time: 06/23/21  3:52 PM  Result Value Ref Range   Glucose-Capillary 142 (H) 70 - 99 mg/dL  Glucose, capillary     Status: Abnormal   Collection Time: 06/23/21  9:21 PM  Result Value Ref Range   Glucose-Capillary 128 (H) 70 - 99 mg/dL  Glucose, capillary     Status: Abnormal   Collection Time: 06/24/21 12:01 AM  Result Value Ref Range   Glucose-Capillary 151 (H) 70 - 99 mg/dL  Glucose, capillary     Status: Abnormal   Collection Time: 06/24/21  3:38 AM  Result Value Ref Range   Glucose-Capillary 172 (H) 70 - 99 mg/dL  Triglycerides     Status: None   Collection Time: 06/24/21  6:15 AM  Result Value Ref Range   Triglycerides 89 <150  mg/dL  CBC     Status: Abnormal   Collection Time: 06/24/21  6:15 AM  Result Value Ref Range   WBC 7.6 4.0 - 10.5 K/uL   RBC 3.76 (L) 4.22 - 5.81 MIL/uL   Hemoglobin 12.2 (L) 13.0 - 17.0 g/dL   HCT 38.9 (L) 39.0 - 52.0 %   MCV 103.5 (H) 80.0 - 100.0 fL   MCH 32.4 26.0 - 34.0 pg   MCHC 31.4 30.0 - 36.0 g/dL   RDW 11.6 11.5 - 15.5 %   Platelets 166 150 - 400 K/uL   nRBC 0.0 0.0 - 0.2 %  Basic metabolic panel     Status: Abnormal   Collection Time: 06/24/21  6:15 AM  Result Value Ref Range   Sodium 136 135 - 145 mmol/L   Potassium 4.1 3.5 - 5.1 mmol/L   Chloride 106 98 - 111 mmol/L   CO2 21 (L) 22 - 32 mmol/L   Glucose, Bld 215 (H) 70 - 99 mg/dL   BUN 17 8 - 23 mg/dL   Creatinine, Ser 0.74 0.61 - 1.24 mg/dL   Calcium 8.3 (L) 8.9 - 10.3 mg/dL   GFR, Estimated >60 >60 mL/min   Anion gap 9 5 - 15  Glucose, capillary     Status: Abnormal   Collection Time: 06/24/21  8:04 AM  Result Value Ref Range   Glucose-Capillary 155 (H) 70 - 99 mg/dL    Assessment & Plan:  Present on Admission:  Skull fracture ([facility])    LOS: 9 days   Additional comments:I reviewed the patient's new clinical lab test results.   and I reviewed the patients new imaging test results.    Found down   SAH, IVH, parenchymal contusion - NSGY c/s, Dr. Kathyrn Sheriff, repeat [facility] 9/18 w/ progression of SAH. No progression of intraventricular hemorrhage. Keppra 500 mg bid x7 days started 9/18. F/u PRN in NS clinic Skull fracture - NSGY c/s, Dr. Kathyrn Sheriff Nondisplaced facial fractures - ENT c/s, Dr. Claudia Desanctis, nonop, head of bed elevation to help with swelling, follow up outpatient Alcohol abuse - 186 on admission. CIWA VDRF - intubated for airway protection 9/22. Incr guaifenisen for secretions 9/23. Still not neurologically appropriate for extubation ID - resp cx 9/23 with S pneumo, empiric cefepime 9/24, de-escalate to cefazolin, 7d course FEN - NPO, Cortrak 9/21, TF to goal 55cc/hr, Check NH4 VTE - SCDs, Lovenox Dispo  - ICU  Critical Care Total Time: 40 minutes  Jesusita Oka, MD Trauma & General Surgery Please use [email or web address] to contact on call provider  06/24/2021  *Care during the described time interval was provided by me. I have reviewed this patient's available data, including medical history, events of note, physical examination and test results as part of my evaluation.

## 2021-06-24 NOTE — TOC Progression Note (Addendum)
Transition of Care (TOC) - Progression Note    Patient Details  Name: Randall Harvey. MRN: [id number] Date of Birth: 05/13/1961  [facility]) CM/SW Contact  Ella Bodo, RN Phone Number: 06/24/2021, 4:17 PM  Clinical Narrative:    Patient remains intubated with increased secretions.  TOC trauma case manager will continue to follow as patient progresses.   Expected Discharge Plan: [place] Barriers to Discharge: Continued Medical Work up  Expected Discharge Plan and Services Expected Discharge Plan: [place]   Discharge Planning Services: CM Consult   Living arrangements for the past 2 months: Single Family Home                                       Social Determinants of Health (SDOH) Interventions    Readmission Risk Interventions No flowsheet data found.  Reinaldo Raddle, RN, BSN  Trauma/Neuro ICU Case Manager [phone]

## 2021-06-25 ENCOUNTER — Inpatient Hospital Stay: Payer: Self-pay

## 2021-06-25 LAB — GLUCOSE, CAPILLARY
Glucose-Capillary: 128 mg/dL — ABNORMAL HIGH (ref 70–99)
Glucose-Capillary: 212 mg/dL — ABNORMAL HIGH (ref 70–99)
Glucose-Capillary: 156 mg/dL — ABNORMAL HIGH (ref 70–99)
Glucose-Capillary: 176 mg/dL — ABNORMAL HIGH (ref 70–99)
Glucose-Capillary: 242 mg/dL — ABNORMAL HIGH (ref 70–99)

## 2021-06-25 LAB — CBC
HCT: 39 % (ref 39.0–52.0)
Hemoglobin: 12.7 g/dL — ABNORMAL LOW (ref 13.0–17.0)
MCH: 32.9 pg (ref 26.0–34.0)
MCHC: 32.6 g/dL (ref 30.0–36.0)
MCV: 101 fL — ABNORMAL HIGH (ref 80.0–100.0)
Platelets: 188 10*3/uL (ref 150–400)
RBC: 3.86 MIL/uL — ABNORMAL LOW (ref 4.22–5.81)
RDW: 11.6 % (ref 11.5–15.5)
WBC: 7 10*3/uL (ref 4.0–10.5)
nRBC: 0 % (ref 0.0–0.2)

## 2021-06-25 LAB — BASIC METABOLIC PANEL
Anion gap: 5 (ref 5–15)
BUN: 17 mg/dL (ref 8–23)
CO2: 23 mmol/L (ref 22–32)
Calcium: 8.4 mg/dL — ABNORMAL LOW (ref 8.9–10.3)
Chloride: 106 mmol/L (ref 98–111)
Creatinine, Ser: 0.7 mg/dL (ref 0.61–1.24)
GFR, Estimated: >60 mL/min (ref >60)
Glucose, Bld: 162 mg/dL — ABNORMAL HIGH (ref 70–99)
Potassium: 4.4 mmol/L (ref 3.5–5.1)
Sodium: 134 mmol/L — ABNORMAL LOW (ref 135–145)

## 2021-06-25 MED ORDER — DEXMEDETOMIDINE HCL IN NACL 400 MCG/100ML IV SOLN
0.4000 ug/kg/h | INTRAVENOUS | Status: DC
  Administered 2021-06-25: 0.4 ug/kg/h via INTRAVENOUS
  Administered 2021-06-25: 1 ug/kg/h via INTRAVENOUS
  Administered 2021-06-25 – 2021-06-26 (×2): 0.9 ug/kg/h via INTRAVENOUS
  Administered 2021-06-26: 1.2 ug/kg/h via INTRAVENOUS
  Administered 2021-06-26: 0.4 ug/kg/h via INTRAVENOUS
  Administered 2021-06-27: 1 ug/kg/h via INTRAVENOUS
  Administered 2021-06-27: 1.2 ug/kg/h via INTRAVENOUS
  Administered 2021-06-27: 1.003 ug/kg/h via INTRAVENOUS
  Administered 2021-06-27 (×2): 1.2 ug/kg/h via INTRAVENOUS
  Administered 2021-06-28 (×2): 1 ug/kg/h via INTRAVENOUS
  Administered 2021-06-28 (×2): 1.2 ug/kg/h via INTRAVENOUS
  Administered 2021-06-28: 1 ug/kg/h via INTRAVENOUS
  Administered 2021-06-29 – 2021-06-30 (×6): 1.2 ug/kg/h via INTRAVENOUS
  Administered 2021-06-30: 1.1 ug/kg/h via INTRAVENOUS
  Administered 2021-06-30 (×3): 1.2 ug/kg/h via INTRAVENOUS
  Administered 2021-07-01: 1 ug/kg/h via INTRAVENOUS
  Administered 2021-07-01 (×2): 1.2 ug/kg/h via INTRAVENOUS
  Administered 2021-07-01: 0.9 ug/kg/h via INTRAVENOUS
  Administered 2021-07-02: 1 ug/kg/h via INTRAVENOUS
  Administered 2021-07-02: 1.2 ug/kg/h via INTRAVENOUS
  Administered 2021-07-02: 0.6 ug/kg/h via INTRAVENOUS
  Administered 2021-07-02: 1.2 ug/kg/h via INTRAVENOUS
  Administered 2021-07-03: 1 ug/kg/h via INTRAVENOUS
  Filled 2021-06-25 (×10): qty 100
  Filled 2021-06-25 (×2): qty 200
  Filled 2021-06-25 (×6): qty 100
  Filled 2021-06-25: qty 200
  Filled 2021-06-25 (×5): qty 100
  Filled 2021-06-25: qty 200
  Filled 2021-06-25 (×9): qty 100

## 2021-06-25 MED ORDER — GUAIFENESIN 100 MG/5ML PO SOLN
20.0000 mL | ORAL | Status: DC
  Administered 2021-06-25 – 2021-07-11 (×80): 400 mg
  Filled 2021-06-25: qty 30
  Filled 2021-06-25: qty 20
  Filled 2021-06-25 (×4): qty 30
  Filled 2021-06-25: qty 20
  Filled 2021-06-25 (×5): qty 30
  Filled 2021-06-25: qty 15
  Filled 2021-06-25: qty 20
  Filled 2021-06-25 (×5): qty 30
  Filled 2021-06-25: qty 15
  Filled 2021-06-25 (×2): qty 30
  Filled 2021-06-25: qty 15
  Filled 2021-06-25: qty 20
  Filled 2021-06-25: qty 30
  Filled 2021-06-25: qty 20
  Filled 2021-06-25 (×2): qty 30
  Filled 2021-06-25: qty 15
  Filled 2021-06-25 (×3): qty 30
  Filled 2021-06-25: qty 15
  Filled 2021-06-25: qty 30
  Filled 2021-06-25: qty 20
  Filled 2021-06-25 (×4): qty 30
  Filled 2021-06-25: qty 20
  Filled 2021-06-25 (×5): qty 30
  Filled 2021-06-25: qty 20
  Filled 2021-06-25 (×8): qty 30
  Filled 2021-06-25 (×3): qty 15
  Filled 2021-06-25 (×5): qty 30
  Filled 2021-06-25 (×3): qty 20
  Filled 2021-06-25: qty 15
  Filled 2021-06-25: qty 30
  Filled 2021-06-25: qty 20
  Filled 2021-06-25: qty 15
  Filled 2021-06-25 (×4): qty 30
  Filled 2021-06-25: qty 15
  Filled 2021-06-25 (×2): qty 30
  Filled 2021-06-25 (×2): qty 20
  Filled 2021-06-25 (×2): qty 15
  Filled 2021-06-25 (×2): qty 30

## 2021-06-25 MED ORDER — OXYCODONE HCL 5 MG PO TABS
5.0000 mg | ORAL_TABLET | Freq: Four times a day (QID) | ORAL | Status: DC
  Administered 2021-06-25 – 2021-07-01 (×24): 5 mg
  Filled 2021-06-25 (×24): qty 1

## 2021-06-25 NOTE — Progress Notes (Addendum)
Patient ID: [name], male   DOB: Jun 14, 1961, 60 y.o.   MRN: [id number] Follow up - Trauma Critical Care  Patient Details:    Bellamy Judson. is an 60 y.o. male.  Lines/tubes : Airway 7.5 mm (Active)  Secured at (cm) 26 cm 06/25/21 0810  Measured From Lips 06/25/21 0810  Secured Location Left 06/25/21 0810  Secured By [employer] 06/25/21 0810  Tube Holder Repositioned Yes 06/25/21 0810  Prone position No 06/25/21 0810  Cuff Pressure (cm H2O) MOV (Manual Technique) 06/25/21 0810  Site Condition Dry 06/25/21 0810     Flatus Tube/Pouch (Active)  Daily care Skin around tube assessed 06/25/21 0800  Output (mL) 100 mL 06/24/21 1814     External Urinary Catheter (Active)  Collection Container Dedicated Suction Canister 06/25/21 0800  Suction (Verified suction is between 40-80 mmHg) Yes 06/25/21 0800  Site Assessment Clean;Intact 06/25/21 0800  Output (mL) 650 mL 06/25/21 0600    Microbiology/Sepsis markers: Results for orders placed or performed during the hospital encounter of 06/15/21  Resp Panel by RT-PCR (Flu A&B, Covid) Nasopharyngeal Swab     Status: None   Collection Time: 06/15/21 10:40 PM   Specimen: Nasopharyngeal Swab; Nasopharyngeal(NP) swabs in vial transport medium  Result Value Ref Range Status   SARS Coronavirus 2 by RT PCR NEGATIVE NEGATIVE Final    Comment: (NOTE) SARS-CoV-2 target nucleic acids are NOT DETECTED.  The SARS-CoV-2 RNA is generally detectable in upper respiratory specimens during the acute phase of infection. The lowest concentration of SARS-CoV-2 viral copies this assay can detect is 138 copies/mL. A negative result does not preclude SARS-Cov-2 infection and should not be used as the sole basis for treatment or other patient management decisions. A negative result may occur with  improper specimen collection/handling, submission of specimen other than nasopharyngeal swab, presence of viral mutation(s) within the areas targeted  by this assay, and inadequate number of viral copies(<138 copies/mL). A negative result must be combined with clinical observations, patient history, and epidemiological information. The expected result is Negative.  Fact Sheet for Patients:  EntrepreneurPulse.com.au  Fact Sheet for Healthcare Providers:  IncredibleEmployment.be  This test is no t yet approved or cleared by the [place] FDA and  has been authorized for detection and/or diagnosis of SARS-CoV-2 by FDA under an Emergency Use Authorization (EUA). This EUA will remain  in effect (meaning this test can be used) for the duration of the COVID-19 declaration under Section 564(b)(1) of the Act, 21 U.S.C.section 360bbb-3(b)(1), unless the authorization is terminated  or revoked sooner.       Influenza A by PCR NEGATIVE NEGATIVE Final   Influenza B by PCR NEGATIVE NEGATIVE Final    Comment: (NOTE) The Xpert Xpress SARS-CoV-2/FLU/RSV plus assay is intended as an aid in the diagnosis of influenza from Nasopharyngeal swab specimens and should not be used as a sole basis for treatment. Nasal washings and aspirates are unacceptable for Xpert Xpress SARS-CoV-2/FLU/RSV testing.  Fact Sheet for Patients: EntrepreneurPulse.com.au  Fact Sheet for Healthcare Providers: IncredibleEmployment.be  This test is not yet approved or cleared by the [place] FDA and has been authorized for detection and/or diagnosis of SARS-CoV-2 by FDA under an Emergency Use Authorization (EUA). This EUA will remain in effect (meaning this test can be used) for the duration of the COVID-19 declaration under Section 564(b)(1) of the Act, 21 U.S.C. section 360bbb-3(b)(1), unless the authorization is terminated or revoked.  Performed at [facility], [address] [place], [place] [address]   Culture, Respiratory w Gram Stain     Status: None   Collection Time:  06/21/21  9:11 AM   Specimen: Tracheal Aspirate; Respiratory  Result Value Ref Range Status   Specimen Description TRACHEAL ASPIRATE  Final   Special Requests NONE  Final   Gram Stain   Final    ABUNDANT WBC PRESENT, PREDOMINANTLY PMN MODERATE GRAM POSITIVE COCCI ABUNDANT GRAM NEGATIVE RODS Performed at [facility], [place] [address], [place], [place] [address]    Culture ABUNDANT STREPTOCOCCUS PNEUMONIAE  Final   Report Status 06/24/2021 FINAL  Final   Organism ID, Bacteria STREPTOCOCCUS PNEUMONIAE  Final      Susceptibility   Streptococcus pneumoniae - MIC*    ERYTHROMYCIN <=0.12 SENSITIVE Sensitive     LEVOFLOXACIN 0.5 SENSITIVE Sensitive     VANCOMYCIN 0.5 SENSITIVE Sensitive     PENICILLIN (meningitis) <=0.06 SENSITIVE Sensitive     PENO - penicillin <=0.06      PENICILLIN (non-meningitis) <=0.06 SENSITIVE Sensitive     PENICILLIN (oral) <=0.06 SENSITIVE Sensitive     CEFTRIAXONE (non-meningitis) <=0.12 SENSITIVE Sensitive     CEFTRIAXONE (meningitis) <=0.12 SENSITIVE Sensitive     * ABUNDANT STREPTOCOCCUS PNEUMONIAE    Anti-infectives:  Anti-infectives (From admission, onward)    Start     Dose/Rate Route Frequency Ordered Stop   06/24/21 1800  ceFAZolin (ANCEF) IVPB 2g/100 mL premix        2 g 200 mL/hr over 30 Minutes Intravenous Every 8 hours 06/24/21 1116 06/29/21 0159   06/22/21 1100  ceFEPIme (MAXIPIME) 2 g in sodium chloride 0.9 % 100 mL IVPB  Status:  Discontinued        2 g 200 mL/hr over 30 Minutes Intravenous Every 8 hours 06/22/21 0957 06/24/21 1116       Best Practice/Protocols:  VTE Prophylaxis: Lovenox (prophylaxtic dose) Continous Sedation  Consults:     Studies:    Events:  Subjective:    Overnight Issues:   Objective:  Vital signs for last 24 hours: Temp:  [98.5 F (36.9 C)-100.4 F (38 C)] 100.4 F (38 C) (09/27 0800) Pulse Rate:  [58-84] 63 (09/27 0810) Resp:  [18-22] 18 (09/27 0810) BP: (107-150)/(63-110) 144/110  (09/27 0800) SpO2:  [95 %-100 %] 97 % (09/27 0810) FiO2 (%):  [40 %] 40 % (09/27 0810) Weight:  [78.6 kg] 78.6 kg (09/27 0302)  Hemodynamic parameters for last 24 hours:    Intake/Output from previous day: 09/26 0701 - 09/27 0700 In: 2275.2 [I.V.:655.2; NG/GT:1320; IV Piggyback:300] Out: 9924 [Urine:1550; Stool:100]  Intake/Output this shift: Total I/O In: 83 [I.V.:28; NG/GT:55] Out: -   Vent settings for last 24 hours: Vent Mode: PRVC FiO2 (%):  [40 %] 40 % Set Rate:  [18 bmp] 18 bmp Vt Set:  [560 mL] 560 mL PEEP:  [5 cmH20] 5 cmH20 Plateau Pressure:  [15 cmH20-19 cmH20] 16 cmH20  Physical Exam:  General: on vent Neuro: quite sedated HEENT/Neck: ETT Resp: clear to auscultation bilaterally CVS: RRR 60s GI: soft, NT Extremities: calves soft  Results for orders placed or performed during the hospital encounter of 06/15/21 (from the past 24 hour(s))  Glucose, capillary     Status: Abnormal   Collection Time: 06/24/21 11:58 AM  Result Value Ref Range   Glucose-Capillary 165 (H) 70 - 99 mg/dL  Ammonia     Status: None   Collection Time: 06/24/21 12:29 PM  Result Value Ref Range   Ammonia  29 9 - 35 umol/L  Glucose, capillary     Status: Abnormal   Collection Time: 06/24/21  3:36 PM  Result Value Ref Range   Glucose-Capillary 181 (H) 70 - 99 mg/dL  Glucose, capillary     Status: Abnormal   Collection Time: 06/24/21  8:22 PM  Result Value Ref Range   Glucose-Capillary 177 (H) 70 - 99 mg/dL  Glucose, capillary     Status: Abnormal   Collection Time: 06/24/21 11:47 PM  Result Value Ref Range   Glucose-Capillary 153 (H) 70 - 99 mg/dL  CBC     Status: Abnormal   Collection Time: 06/25/21  2:38 AM  Result Value Ref Range   WBC 7.0 4.0 - 10.5 K/uL   RBC 3.86 (L) 4.22 - 5.81 MIL/uL   Hemoglobin 12.7 (L) 13.0 - 17.0 g/dL   HCT 39.0 39.0 - 52.0 %   MCV 101.0 (H) 80.0 - 100.0 fL   MCH 32.9 26.0 - 34.0 pg   MCHC 32.6 30.0 - 36.0 g/dL   RDW 11.6 11.5 - 15.5 %   Platelets  188 150 - 400 K/uL   nRBC 0.0 0.0 - 0.2 %  Basic metabolic panel     Status: Abnormal   Collection Time: 06/25/21  2:38 AM  Result Value Ref Range   Sodium 134 (L) 135 - 145 mmol/L   Potassium 4.4 3.5 - 5.1 mmol/L   Chloride 106 98 - 111 mmol/L   CO2 23 22 - 32 mmol/L   Glucose, Bld 162 (H) 70 - 99 mg/dL   BUN 17 8 - 23 mg/dL   Creatinine, Ser 0.70 0.61 - 1.24 mg/dL   Calcium 8.4 (L) 8.9 - 10.3 mg/dL   GFR, Estimated >60 >60 mL/min   Anion gap 5 5 - 15  Glucose, capillary     Status: Abnormal   Collection Time: 06/25/21  3:34 AM  Result Value Ref Range   Glucose-Capillary 128 (H) 70 - 99 mg/dL  Glucose, capillary     Status: Abnormal   Collection Time: 06/25/21  7:56 AM  Result Value Ref Range   Glucose-Capillary 212 (H) 70 - 99 mg/dL   Comment 1 Notify RN    Comment 2 Document in Chart     Assessment & Plan: Present on Admission:  Skull fracture ([place])    LOS: 10 days   Additional comments:I reviewed the patient's new clinical lab test results. . Found down   SAH, IVH, parenchymal contusion - NSGY c/s, Dr. Kathyrn Sheriff, repeat [facility] 9/18 w/ progression of SAH. F/U CT H evolving contusions and decreased SAH, small B hygromas. Try to lighten sedation by transitioning to Precedex, may need MRI brain if neuro exam does not improve, NH4 WNL Skull fracture - NSGY c/s, Dr. Kathyrn Sheriff Nondisplaced facial fractures - ENT c/s, Dr. Claudia Desanctis, nonop, head of bed elevation to help with swelling, follow up outpatient Alcohol abuse - 186 on admission. CIWA, NH4 WNL VDRF - intubated for airway protection 9/22. Incr guaifenisen again today for secretions ID - resp cx 9/23 with S pneumo, empiric cefepime 9/24, de-escalate to cefazolin, 7d course FEN - NPO, Cortrak 9/21, TF to goal VTE - SCDs, Lovenox Dispo - ICU, place PICC Critical Care Total Time*: 35 Minutes  Georganna Skeans, MD, MPH, FACS Trauma & General Surgery Use [email or web address] to contact on call provider  06/25/2021  *Care during the  described time interval was provided by me. I have reviewed this patient's available data, including medical history, events of  note, physical examination and test results as part of my evaluation.

## 2021-06-26 LAB — BASIC METABOLIC PANEL
Anion gap: 7 (ref 5–15)
BUN: 23 mg/dL (ref 8–23)
CO2: 24 mmol/L (ref 22–32)
Calcium: 8.7 mg/dL — ABNORMAL LOW (ref 8.9–10.3)
Chloride: 103 mmol/L (ref 98–111)
Creatinine, Ser: 0.69 mg/dL (ref 0.61–1.24)
GFR, Estimated: >60 mL/min (ref >60)
Glucose, Bld: 176 mg/dL — ABNORMAL HIGH (ref 70–99)
Potassium: 4 mmol/L (ref 3.5–5.1)
Sodium: 134 mmol/L — ABNORMAL LOW (ref 135–145)

## 2021-06-26 LAB — CBC
HCT: 39.4 % (ref 39.0–52.0)
Hemoglobin: 13 g/dL (ref 13.0–17.0)
MCH: 33 pg (ref 26.0–34.0)
MCHC: 33 g/dL (ref 30.0–36.0)
MCV: 100 fL (ref 80.0–100.0)
Platelets: 224 10*3/uL (ref 150–400)
RBC: 3.94 MIL/uL — ABNORMAL LOW (ref 4.22–5.81)
RDW: 11.4 % — ABNORMAL LOW (ref 11.5–15.5)
WBC: 9.6 10*3/uL (ref 4.0–10.5)
nRBC: 0 % (ref 0.0–0.2)

## 2021-06-26 LAB — GLUCOSE, CAPILLARY
Glucose-Capillary: 131 mg/dL — ABNORMAL HIGH (ref 70–99)
Glucose-Capillary: 163 mg/dL — ABNORMAL HIGH (ref 70–99)
Glucose-Capillary: 171 mg/dL — ABNORMAL HIGH (ref 70–99)
Glucose-Capillary: 176 mg/dL — ABNORMAL HIGH (ref 70–99)
Glucose-Capillary: 195 mg/dL — ABNORMAL HIGH (ref 70–99)
Glucose-Capillary: 210 mg/dL — ABNORMAL HIGH (ref 70–99)
Glucose-Capillary: 250 mg/dL — ABNORMAL HIGH (ref 70–99)

## 2021-06-26 MED ORDER — INSULIN ASPART 100 UNIT/ML IJ SOLN
0.0000 [IU] | INTRAMUSCULAR | Status: DC
  Administered 2021-06-26 (×2): 4 [IU] via SUBCUTANEOUS
  Administered 2021-06-26: 7 [IU] via SUBCUTANEOUS
  Administered 2021-06-27: 3 [IU] via SUBCUTANEOUS
  Administered 2021-06-27: 4 [IU] via SUBCUTANEOUS
  Administered 2021-06-27: 7 [IU] via SUBCUTANEOUS
  Administered 2021-06-27: 4 [IU] via SUBCUTANEOUS
  Administered 2021-06-27: 7 [IU] via SUBCUTANEOUS
  Administered 2021-06-27: 4 [IU] via SUBCUTANEOUS
  Administered 2021-06-28: 3 [IU] via SUBCUTANEOUS
  Administered 2021-06-28: 7 [IU] via SUBCUTANEOUS
  Administered 2021-06-28 (×2): 4 [IU] via SUBCUTANEOUS
  Administered 2021-06-28: 7 [IU] via SUBCUTANEOUS
  Administered 2021-06-29 (×2): 4 [IU] via SUBCUTANEOUS
  Administered 2021-06-29: 7 [IU] via SUBCUTANEOUS
  Administered 2021-06-29: 3 [IU] via SUBCUTANEOUS
  Administered 2021-06-30: 7 [IU] via SUBCUTANEOUS
  Administered 2021-06-30: 4 [IU] via SUBCUTANEOUS
  Administered 2021-06-30: 3 [IU] via SUBCUTANEOUS
  Administered 2021-06-30 – 2021-07-01 (×5): 4 [IU] via SUBCUTANEOUS
  Administered 2021-07-01: 3 [IU] via SUBCUTANEOUS
  Administered 2021-07-01 – 2021-07-02 (×5): 4 [IU] via SUBCUTANEOUS
  Administered 2021-07-02: 3 [IU] via SUBCUTANEOUS
  Administered 2021-07-02: 7 [IU] via SUBCUTANEOUS
  Administered 2021-07-02: 4 [IU] via SUBCUTANEOUS
  Administered 2021-07-03: 3 [IU] via SUBCUTANEOUS
  Administered 2021-07-03: 4 [IU] via SUBCUTANEOUS
  Administered 2021-07-03: 3 [IU] via SUBCUTANEOUS
  Administered 2021-07-03 – 2021-07-04 (×4): 4 [IU] via SUBCUTANEOUS
  Administered 2021-07-04: 3 [IU] via SUBCUTANEOUS
  Administered 2021-07-04 (×2): 4 [IU] via SUBCUTANEOUS
  Administered 2021-07-04: 3 [IU] via SUBCUTANEOUS
  Administered 2021-07-04 – 2021-07-05 (×2): 4 [IU] via SUBCUTANEOUS
  Administered 2021-07-05: 3 [IU] via SUBCUTANEOUS
  Administered 2021-07-05 (×3): 4 [IU] via SUBCUTANEOUS
  Administered 2021-07-05: 3 [IU] via SUBCUTANEOUS
  Administered 2021-07-05: 4 [IU] via SUBCUTANEOUS
  Administered 2021-07-06: 3 [IU] via SUBCUTANEOUS
  Administered 2021-07-06: 4 [IU] via SUBCUTANEOUS
  Administered 2021-07-06: 7 [IU] via SUBCUTANEOUS
  Administered 2021-07-06: 3 [IU] via SUBCUTANEOUS
  Administered 2021-07-06: 4 [IU] via SUBCUTANEOUS
  Administered 2021-07-06 – 2021-07-07 (×3): 3 [IU] via SUBCUTANEOUS
  Administered 2021-07-07 (×2): 4 [IU] via SUBCUTANEOUS
  Administered 2021-07-08: 3 [IU] via SUBCUTANEOUS
  Administered 2021-07-08 (×2): 4 [IU] via SUBCUTANEOUS
  Administered 2021-07-10 – 2021-07-12 (×5): 3 [IU] via SUBCUTANEOUS

## 2021-06-26 MED ORDER — FUROSEMIDE 10 MG/ML IJ SOLN
40.0000 mg | Freq: Once | INTRAMUSCULAR | Status: AC
  Administered 2021-06-26: 40 mg via INTRAVENOUS
  Filled 2021-06-26: qty 4

## 2021-06-26 MED ORDER — CLONAZEPAM 1 MG PO TABS
1.0000 mg | ORAL_TABLET | Freq: Two times a day (BID) | ORAL | Status: DC
  Administered 2021-06-26 – 2021-07-03 (×14): 1 mg
  Filled 2021-06-26 (×14): qty 1

## 2021-06-26 MED ORDER — SODIUM CHLORIDE 0.9% FLUSH
10.0000 mL | Freq: Two times a day (BID) | INTRAVENOUS | Status: DC
  Administered 2021-06-26: 10 mL
  Administered 2021-06-26: 20 mL
  Administered 2021-06-26 – 2021-07-01 (×8): 10 mL
  Administered 2021-07-01: 20 mL
  Administered 2021-07-02 (×2): 10 mL

## 2021-06-26 MED ORDER — SODIUM CHLORIDE 0.9% FLUSH: 10.0000 mL | INTRAVENOUS | Status: DC | PRN

## 2021-06-26 NOTE — Progress Notes (Signed)
Patient became very agitated and restless when getting suctioned by RT. The patient received a bolus of 58mcg of fentanyl and the precedex was increased from 0.9 to 1.0 by this RN. The patient's HR dropped into the high 30s, when his HR has ranged from 45-55 throughout the shift. I cut the precedex off and obtained an ECG which showed sinus bradycardia. The patients BP maintained adequate throughout the drop in HR. Trauma MD on call notified by this RN, and no new orders received at this time. Precedex remains off. RN will continue to monitor.  Wyn Quaker, RN

## 2021-06-26 NOTE — Progress Notes (Signed)
Peripherally Inserted Central Catheter Placement  The IV Nurse has discussed with the patient and/or persons authorized to consent for the patient, the purpose of this procedure and the potential benefits and risks involved with this procedure.  The benefits include less needle sticks, lab draws from the catheter, and the patient may be discharged home with the catheter. Risks include, but not limited to, infection, bleeding, blood clot (thrombus formation), and puncture of an artery; nerve damage and irregular heartbeat and possibility to perform a PICC exchange if needed/ordered by physician.  Alternatives to this procedure were also discussed.  Bard Power PICC patient education guide, fact sheet on infection prevention and patient information card has been provided to patient /or left at bedside.    PICC Placement Documentation  PICC Double Lumen 07/62/26 PICC Right Basilic 40 cm 2 cm (Active)  Indication for Insertion or Continuance of Line Prolonged intravenous therapies 06/26/21 0000  Exposed Catheter (cm) 2 cm 06/26/21 0000  Site Assessment Clean;Dry;Intact 06/26/21 0000  Lumen #1 Status Blood return noted;Saline locked 06/26/21 0000  Lumen #2 Status Saline locked;Blood return noted 06/26/21 0000  Dressing Type Transparent;Securing device 06/26/21 0000  Dressing Status Clean;Dry;Intact 06/26/21 0000  Antimicrobial disc in place? Yes 06/26/21 0000  Safety Lock Not Applicable 33/35/45 6256  Line Care Connections checked and tightened 06/26/21 0000  Line Adjustment (NICU/IV Team Only) No 06/26/21 0000  Dressing Intervention New dressing 06/26/21 0000  Dressing Change Due 07/03/21 06/26/21 0000       Rosalio Macadamia Chenice 06/26/2021, 12:08 AM

## 2021-06-26 NOTE — Progress Notes (Signed)
Patient ID: [name], male   DOB: [birth year], 60 y.o.   MRN: [id number] Follow up - Trauma Critical Care  Patient Details:    Dylann Gallier. is an 60 y.o. male.  Lines/tubes : Airway 7.5 mm (Active)  Secured at (cm) 26 cm 06/26/21 0800  Measured From Lips 06/26/21 [place] 06/26/21 0326  Secured By [employer] 06/26/21 0326  Tube Holder Repositioned Yes 06/26/21 0326  Prone position No 06/26/21 0326  Cuff Pressure (cm H2O) MOV (Manual Technique) 06/25/21 1929  Site Condition Dry 06/26/21 0800     PICC Double Lumen 16/96/78 PICC Right Basilic 40 cm 2 cm (Active)  Indication for Insertion or Continuance of Line Prolonged intravenous therapies 06/26/21 0800  Exposed Catheter (cm) 2 cm 06/26/21 0000  Site Assessment Clean;Dry;Intact 06/26/21 0800  Lumen #1 Status Blood return noted;Saline locked 06/26/21 0800  Lumen #2 Status Saline locked;Blood return noted 06/26/21 0800  Dressing Type Transparent;Securing device 06/26/21 0800  Dressing Status Clean;Dry;Intact 06/26/21 0800  Antimicrobial disc in place? Yes 06/26/21 0800  Safety Lock Not Applicable 93/81/01 7510  Line Care Connections checked and tightened 06/26/21 0800  Line Adjustment (NICU/IV Team Only) No 06/26/21 0800  Dressing Intervention Dressing reinforced 06/26/21 0800  Dressing Change Due 07/03/21 06/26/21 0800     Flatus Tube/Pouch (Active)  Daily care Skin around tube assessed 06/26/21 0800  Output (mL) 0 mL 06/26/21 0600     External Urinary Catheter (Active)  Collection Container Dedicated Suction Canister 06/26/21 0800  Suction (Verified suction is between 40-80 mmHg) Yes 06/26/21 0800  Site Assessment Clean;Intact 06/26/21 0800  Output (mL) 600 mL 06/26/21 0600    Microbiology/Sepsis markers: Results for orders placed or performed during the hospital encounter of 06/15/21  Resp Panel by RT-PCR (Flu A&B, Covid) Nasopharyngeal Swab     Status: None   Collection Time:  06/15/21 10:40 PM   Specimen: Nasopharyngeal Swab; Nasopharyngeal(NP) swabs in vial transport medium  Result Value Ref Range Status   SARS Coronavirus 2 by RT PCR NEGATIVE NEGATIVE Final    Comment: (NOTE) SARS-CoV-2 target nucleic acids are NOT DETECTED.  The SARS-CoV-2 RNA is generally detectable in upper respiratory specimens during the acute phase of infection. The lowest concentration of SARS-CoV-2 viral copies this assay can detect is 138 copies/mL. A negative result does not preclude SARS-Cov-2 infection and should not be used as the sole basis for treatment or other patient management decisions. A negative result may occur with  improper specimen collection/handling, submission of specimen other than nasopharyngeal swab, presence of viral mutation(s) within the areas targeted by this assay, and inadequate number of viral copies(<138 copies/mL). A negative result must be combined with clinical observations, patient history, and epidemiological information. The expected result is Negative.  Fact Sheet for Patients:  EntrepreneurPulse.com.au  Fact Sheet for Healthcare Providers:  IncredibleEmployment.be  This test is no t yet approved or cleared by the [place] FDA and  has been authorized for detection and/or diagnosis of SARS-CoV-2 by FDA under an Emergency Use Authorization (EUA). This EUA will remain  in effect (meaning this test can be used) for the duration of the COVID-19 declaration under Section 564(b)(1) of the Act, 21 U.S.C.section 360bbb-3(b)(1), unless the authorization is terminated  or revoked sooner.       Influenza A by PCR NEGATIVE NEGATIVE Final   Influenza B by PCR NEGATIVE NEGATIVE Final    Comment: (NOTE) The Xpert Xpress SARS-CoV-2/FLU/RSV plus assay is intended  as an aid in the diagnosis of influenza from Nasopharyngeal swab specimens and should not be used as a sole basis for treatment. Nasal washings  and aspirates are unacceptable for Xpert Xpress SARS-CoV-2/FLU/RSV testing.  Fact Sheet for Patients: EntrepreneurPulse.com.au  Fact Sheet for Healthcare Providers: IncredibleEmployment.be  This test is not yet approved or cleared by the [place] FDA and has been authorized for detection and/or diagnosis of SARS-CoV-2 by FDA under an Emergency Use Authorization (EUA). This EUA will remain in effect (meaning this test can be used) for the duration of the COVID-19 declaration under Section 564(b)(1) of the Act, 21 U.S.C. section 360bbb-3(b)(1), unless the authorization is terminated or revoked.  Performed at [facility], [place] [address], [place], [place] [address]   Culture, Respiratory w Gram Stain     Status: None   Collection Time: 06/21/21  9:11 AM   Specimen: Tracheal Aspirate; Respiratory  Result Value Ref Range Status   Specimen Description TRACHEAL ASPIRATE  Final   Special Requests NONE  Final   Gram Stain   Final    ABUNDANT WBC PRESENT, PREDOMINANTLY PMN MODERATE GRAM POSITIVE COCCI ABUNDANT GRAM NEGATIVE RODS Performed at [facility], [place] [address], [place], [place] [address]    Culture ABUNDANT STREPTOCOCCUS PNEUMONIAE  Final   Report Status 06/24/2021 FINAL  Final   Organism ID, Bacteria STREPTOCOCCUS PNEUMONIAE  Final      Susceptibility   Streptococcus pneumoniae - MIC*    ERYTHROMYCIN <=0.12 SENSITIVE Sensitive     LEVOFLOXACIN 0.5 SENSITIVE Sensitive     VANCOMYCIN 0.5 SENSITIVE Sensitive     PENICILLIN (meningitis) <=0.06 SENSITIVE Sensitive     PENO - penicillin <=0.06      PENICILLIN (non-meningitis) <=0.06 SENSITIVE Sensitive     PENICILLIN (oral) <=0.06 SENSITIVE Sensitive     CEFTRIAXONE (non-meningitis) <=0.12 SENSITIVE Sensitive     CEFTRIAXONE (meningitis) <=0.12 SENSITIVE Sensitive     * ABUNDANT STREPTOCOCCUS PNEUMONIAE    Anti-infectives:  Anti-infectives (From admission, onward)     Start     Dose/Rate Route Frequency Ordered Stop   06/24/21 1800  ceFAZolin (ANCEF) IVPB 2g/100 mL premix        2 g 200 mL/hr over 30 Minutes Intravenous Every 8 hours 06/24/21 1116 06/29/21 0159   06/22/21 1100  ceFEPIme (MAXIPIME) 2 g in sodium chloride 0.9 % 100 mL IVPB  Status:  Discontinued        2 g 200 mL/hr over 30 Minutes Intravenous Every 8 hours 06/22/21 0957 06/24/21 1116       Best Practice/Protocols:  VTE Prophylaxis: Lovenox (prophylaxtic dose) Continous Sedation  Consults:     Studies:    Events:  Subjective:    Overnight Issues:   Objective:  Vital signs for last 24 hours: Temp:  [98.2 F (36.8 C)-99.7 F (37.6 C)] 98.2 F (36.8 C) (09/28 0750) Pulse Rate:  [49-111] 111 (09/28 0930) Resp:  [14-18] 14 (09/28 0930) BP: (99-166)/(60-104) 139/104 (09/28 0930) SpO2:  [94 %-99 %] 98 % (09/28 0930) FiO2 (%):  [40 %] 40 % (09/28 0326) Weight:  [78.6 kg] 78.6 kg (09/28 0214)  Hemodynamic parameters for last 24 hours:    Intake/Output from previous day: 09/27 0701 - 09/28 0700 In: 2274.8 [I.V.:654.7; NG/GT:1320; IV Piggyback:300.1] Out: 1300 [Urine:1300]  Intake/Output this shift: Total I/O In: 165.8 [I.V.:55.8; NG/GT:110] Out: -   Vent settings for last 24 hours: Vent Mode: PRVC FiO2 (%):  [40 %] 40 % Set Rate:  [  18 bmp] 18 bmp Vt Set:  [560 mL] 560 mL PEEP:  [5 cmH20] 5 cmH20 Pressure Support:  [5 cmH20] 5 cmH20 Plateau Pressure:  [16 MWU13-24 cmH20] 18 cmH20  Physical Exam:  General: on vent Neuro: now sedated, when agitated moved feet to command, no movement LUE HEENT/Neck: ETT Resp: clear to auscultation bilaterally CVS: RRR GI: soft, NT Extremities: calves soft  Results for orders placed or performed during the hospital encounter of 06/15/21 (from the past 24 hour(s))  Glucose, capillary     Status: Abnormal   Collection Time: 06/25/21 11:35 AM  Result Value Ref Range   Glucose-Capillary 156 (H) 70 - 99 mg/dL   Comment 1  Notify RN    Comment 2 Document in Chart   Glucose, capillary     Status: Abnormal   Collection Time: 06/25/21  3:58 PM  Result Value Ref Range   Glucose-Capillary 176 (H) 70 - 99 mg/dL   Comment 1 Notify RN    Comment 2 Document in Chart   Glucose, capillary     Status: Abnormal   Collection Time: 06/25/21  8:08 PM  Result Value Ref Range   Glucose-Capillary 242 (H) 70 - 99 mg/dL  Glucose, capillary     Status: Abnormal   Collection Time: 06/26/21 12:10 AM  Result Value Ref Range   Glucose-Capillary 195 (H) 70 - 99 mg/dL  Glucose, capillary     Status: Abnormal   Collection Time: 06/26/21  3:46 AM  Result Value Ref Range   Glucose-Capillary 171 (H) 70 - 99 mg/dL  CBC     Status: Abnormal   Collection Time: 06/26/21  4:38 AM  Result Value Ref Range   WBC 9.6 4.0 - 10.5 K/uL   RBC 3.94 (L) 4.22 - 5.81 MIL/uL   Hemoglobin 13.0 13.0 - 17.0 g/dL   HCT 39.4 39.0 - 52.0 %   MCV 100.0 80.0 - 100.0 fL   MCH 33.0 26.0 - 34.0 pg   MCHC 33.0 30.0 - 36.0 g/dL   RDW 11.4 (L) 11.5 - 15.5 %   Platelets 224 150 - 400 K/uL   nRBC 0.0 0.0 - 0.2 %  Basic metabolic panel     Status: Abnormal   Collection Time: 06/26/21  4:38 AM  Result Value Ref Range   Sodium 134 (L) 135 - 145 mmol/L   Potassium 4.0 3.5 - 5.1 mmol/L   Chloride 103 98 - 111 mmol/L   CO2 24 22 - 32 mmol/L   Glucose, Bld 176 (H) 70 - 99 mg/dL   BUN 23 8 - 23 mg/dL   Creatinine, Ser 0.69 0.61 - 1.24 mg/dL   Calcium 8.7 (L) 8.9 - 10.3 mg/dL   GFR, Estimated >60 >60 mL/min   Anion gap 7 5 - 15  Glucose, capillary     Status: Abnormal   Collection Time: 06/26/21  7:48 AM  Result Value Ref Range   Glucose-Capillary 210 (H) 70 - 99 mg/dL    Assessment & Plan: Present on Admission:  Skull fracture ([place])    LOS: 11 days   Additional comments:I reviewed the patient's new clinical lab test results. . Found down   SAH, IVH, parenchymal contusion - NSGY c/s, Dr. Kathyrn Sheriff, repeat [facility] 9/18 w/ progression of SAH. F/U CT H  evolving contusions and decreased SAH, small B hygromas. Try to lighten sedation by transitioning to Precedex, exam improving Skull fracture - NSGY c/s, Dr. Kathyrn Sheriff Nondisplaced facial fractures - ENT c/s, Dr. Claudia Desanctis, nonop, head  of bed elevation to help with swelling, follow up outpatient Alcohol abuse - 186 on admission. CIWA, NH4 WNL VDRF - intubated for airway protection 9/22. Cont guaifenisen for secretions ID - resp cx 9/23 with S pneumo, empiric cefepime 9/24, de-escalate to cefazolin, 7d course FEN - NPO, Cortrak 9/21, TF, lasix x 1 VTE - SCDs, Lovenox Dispo - ICU, place PICC Critical Care Total Time*: 35 Minutes  Georganna Skeans, MD, MPH, FACS Trauma & General Surgery Use [email or web address] to contact on call provider  06/26/2021  *Care during the described time interval was provided by me. I have reviewed this patient's available data, including medical history, events of note, physical examination and test results as part of my evaluation.

## 2021-06-27 ENCOUNTER — Inpatient Hospital Stay (HOSPITAL_COMMUNITY): Payer: Medicare Other

## 2021-06-27 LAB — CBC
HCT: 40.7 % (ref 39.0–52.0)
Hemoglobin: 13.3 g/dL (ref 13.0–17.0)
MCH: 32.4 pg (ref 26.0–34.0)
MCHC: 32.7 g/dL (ref 30.0–36.0)
MCV: 99 fL (ref 80.0–100.0)
Platelets: 246 10*3/uL (ref 150–400)
RBC: 4.11 MIL/uL — ABNORMAL LOW (ref 4.22–5.81)
RDW: 11.5 % (ref 11.5–15.5)
WBC: 8.7 10*3/uL (ref 4.0–10.5)
nRBC: 0 % (ref 0.0–0.2)

## 2021-06-27 LAB — GLUCOSE, CAPILLARY
Glucose-Capillary: 172 mg/dL — ABNORMAL HIGH (ref 70–99)
Glucose-Capillary: 213 mg/dL — ABNORMAL HIGH (ref 70–99)
Glucose-Capillary: 195 mg/dL — ABNORMAL HIGH (ref 70–99)
Glucose-Capillary: 207 mg/dL — ABNORMAL HIGH (ref 70–99)
Glucose-Capillary: 174 mg/dL — ABNORMAL HIGH (ref 70–99)

## 2021-06-27 LAB — BASIC METABOLIC PANEL
Anion gap: 9 (ref 5–15)
BUN: 24 mg/dL — ABNORMAL HIGH (ref 8–23)
CO2: 24 mmol/L (ref 22–32)
Calcium: 9.1 mg/dL (ref 8.9–10.3)
Chloride: 104 mmol/L (ref 98–111)
Creatinine, Ser: 0.75 mg/dL (ref 0.61–1.24)
GFR, Estimated: >60 mL/min (ref >60)
Glucose, Bld: 182 mg/dL — ABNORMAL HIGH (ref 70–99)
Potassium: 4.4 mmol/L (ref 3.5–5.1)
Sodium: 137 mmol/L (ref 135–145)

## 2021-06-27 LAB — TRIGLYCERIDES: Triglycerides: 78 mg/dL (ref <150)

## 2021-06-27 MED ORDER — FUROSEMIDE 10 MG/ML IJ SOLN
40.0000 mg | Freq: Once | INTRAMUSCULAR | Status: AC
  Administered 2021-06-27: 40 mg via INTRAVENOUS
  Filled 2021-06-27: qty 4

## 2021-06-27 MED ORDER — PIVOT 1.5 CAL PO LIQD
1000.0000 mL | ORAL | Status: DC
  Administered 2021-07-01 – 2021-07-08 (×8): 1000 mL
  Filled 2021-06-27 (×7): qty 1000

## 2021-06-27 NOTE — Progress Notes (Signed)
Patient's son and sister called this morning and were updated on patient's progress. Clarification on who could be updated and password was requested from the son, as a previous comment stated he wanted no other family to be updated. Son states that anyone with the password, [email or web address], is allowed to be updated.

## 2021-06-27 NOTE — Progress Notes (Signed)
Patient transported on vent to MRI and returned to 1V40 without complications.

## 2021-06-27 NOTE — Progress Notes (Signed)
Patient ID: Randall Girt., male   DOB: Feb 11, 1961, 60 y.o.   MRN: [id number] Follow up - Trauma Critical Care  Patient Details:    Randall Schara. is an 60 y.o. male.  Lines/tubes : Airway 7.5 mm (Active)  Secured at (cm) 23 cm 06/27/21 0856  Measured From Lips 06/27/21 0856  Secured Location Right 06/27/21 0856  Secured By [employer] 06/27/21 0856  Tube Holder Repositioned Yes 06/27/21 0746  Prone position No 06/27/21 0746  Cuff Pressure (cm H2O) MOV (Manual Technique) 06/27/21 0746  Site Condition Dry 06/27/21 0856     PICC Double Lumen 85/27/78 PICC Right Basilic 40 cm 2 cm (Active)  Indication for Insertion or Continuance of Line Prolonged intravenous therapies 06/27/21 0800  Exposed Catheter (cm) 2 cm 06/26/21 0000  Site Assessment Clean;Intact;Dry 06/27/21 0800  Lumen #1 Status Infusing;Flushed 06/27/21 0800  Lumen #2 Status Infusing;Flushed 06/27/21 0800  Dressing Type Transparent;Securing device 06/27/21 0800  Dressing Status Dry;Clean;Intact 06/27/21 0800  Antimicrobial disc in place? Yes 06/27/21 0800  Safety Lock Not Applicable 24/23/53 6144  Line Care Connections checked and tightened 06/27/21 0800  Line Adjustment (NICU/IV Team Only) No 06/26/21 0800  Dressing Intervention Dressing reinforced 06/26/21 0800  Dressing Change Due 07/03/21 06/27/21 0800     Flatus Tube/Pouch (Active)  Daily care Skin around tube assessed 06/27/21 0800  Output (mL) 0 mL 06/27/21 0500     External Urinary Catheter (Active)  Collection Container Dedicated Suction Canister 06/27/21 0800  Suction (Verified suction is between 40-80 mmHg) Yes 06/27/21 0800  Site Assessment Clean;Intact 06/27/21 0800  Output (mL) 500 mL 06/27/21 0800    Microbiology/Sepsis markers: Results for orders placed or performed during the hospital encounter of 06/15/21  Resp Panel by RT-PCR (Flu A&B, Covid) Nasopharyngeal Swab     Status: None   Collection Time: 06/15/21 10:40 PM   Specimen:  Nasopharyngeal Swab; Nasopharyngeal(NP) swabs in vial transport medium  Result Value Ref Range Status   SARS Coronavirus 2 by RT PCR NEGATIVE NEGATIVE Final    Comment: (NOTE) SARS-CoV-2 target nucleic acids are NOT DETECTED.  The SARS-CoV-2 RNA is generally detectable in upper respiratory specimens during the acute phase of infection. The lowest concentration of SARS-CoV-2 viral copies this assay can detect is 138 copies/mL. A negative result does not preclude SARS-Cov-2 infection and should not be used as the sole basis for treatment or other patient management decisions. A negative result may occur with  improper specimen collection/handling, submission of specimen other than nasopharyngeal swab, presence of viral mutation(s) within the areas targeted by this assay, and inadequate number of viral copies(<138 copies/mL). A negative result must be combined with clinical observations, patient history, and epidemiological information. The expected result is Negative.  Fact Sheet for Patients:  EntrepreneurPulse.com.au  Fact Sheet for Healthcare Providers:  IncredibleEmployment.be  This test is no t yet approved or cleared by the [place] FDA and  has been authorized for detection and/or diagnosis of SARS-CoV-2 by FDA under an Emergency Use Authorization (EUA). This EUA will remain  in effect (meaning this test can be used) for the duration of the COVID-19 declaration under Section 564(b)(1) of the Act, 21 U.S.C.section 360bbb-3(b)(1), unless the authorization is terminated  or revoked sooner.       Influenza A by PCR NEGATIVE NEGATIVE Final   Influenza B by PCR NEGATIVE NEGATIVE Final    Comment: (NOTE) The Xpert Xpress SARS-CoV-2/FLU/RSV plus assay is intended as an aid in the diagnosis  of influenza from Nasopharyngeal swab specimens and should not be used as a sole basis for treatment. Nasal washings and aspirates are unacceptable for  Xpert Xpress SARS-CoV-2/FLU/RSV testing.  Fact Sheet for Patients: EntrepreneurPulse.com.au  Fact Sheet for Healthcare Providers: IncredibleEmployment.be  This test is not yet approved or cleared by the [place] FDA and has been authorized for detection and/or diagnosis of SARS-CoV-2 by FDA under an Emergency Use Authorization (EUA). This EUA will remain in effect (meaning this test can be used) for the duration of the COVID-19 declaration under Section 564(b)(1) of the Act, 21 U.S.C. section 360bbb-3(b)(1), unless the authorization is terminated or revoked.  Performed at [facility], [place] [address] [place], [place] [address]   Culture, Respiratory w Gram Stain     Status: None   Collection Time: 06/21/21  9:11 AM   Specimen: Tracheal Aspirate; Respiratory  Result Value Ref Range Status   Specimen Description TRACHEAL ASPIRATE  Final   Special Requests NONE  Final   Gram Stain   Final    ABUNDANT WBC PRESENT, PREDOMINANTLY PMN MODERATE GRAM POSITIVE COCCI ABUNDANT GRAM NEGATIVE RODS Performed at [facility], [place] [address], [place], [place] [address]    Culture ABUNDANT STREPTOCOCCUS PNEUMONIAE  Final   Report Status 06/24/2021 FINAL  Final   Organism ID, Bacteria STREPTOCOCCUS PNEUMONIAE  Final      Susceptibility   Streptococcus pneumoniae - MIC*    ERYTHROMYCIN <=0.12 SENSITIVE Sensitive     LEVOFLOXACIN 0.5 SENSITIVE Sensitive     VANCOMYCIN 0.5 SENSITIVE Sensitive     PENICILLIN (meningitis) <=0.06 SENSITIVE Sensitive     PENO - penicillin <=0.06      PENICILLIN (non-meningitis) <=0.06 SENSITIVE Sensitive     PENICILLIN (oral) <=0.06 SENSITIVE Sensitive     CEFTRIAXONE (non-meningitis) <=0.12 SENSITIVE Sensitive     CEFTRIAXONE (meningitis) <=0.12 SENSITIVE Sensitive     * ABUNDANT STREPTOCOCCUS PNEUMONIAE    Anti-infectives:  Anti-infectives (From admission, onward)    Start     Dose/Rate Route Frequency  Ordered Stop   06/24/21 1800  ceFAZolin (ANCEF) IVPB 2g/100 mL premix        2 g 200 mL/hr over 30 Minutes Intravenous Every 8 hours 06/24/21 1116 06/29/21 0159   06/22/21 1100  ceFEPIme (MAXIPIME) 2 g in sodium chloride 0.9 % 100 mL IVPB  Status:  Discontinued        2 g 200 mL/hr over 30 Minutes Intravenous Every 8 hours 06/22/21 0957 06/24/21 1116       Best Practice/Protocols:  VTE Prophylaxis: Lovenox (prophylaxtic dose) Continous Sedation  Consults:     Studies:    Events:  Subjective:    Overnight Issues:   Objective:  Vital signs for last 24 hours: Temp:  [98.3 F (36.8 C)-100.4 F (38 C)] 99.2 F (37.3 C) (09/29 0800) Pulse Rate:  [51-65] 56 (09/29 1000) Resp:  [18-22] 18 (09/29 1000) BP: (92-138)/(61-78) 107/64 (09/29 1000) SpO2:  [96 %-100 %] 96 % (09/29 1000) FiO2 (%):  [40 %] 40 % (09/29 0746) Weight:  [78.9 kg] 78.9 kg (09/29 0342)  Hemodynamic parameters for last 24 hours:    Intake/Output from previous day: 09/28 0701 - 09/29 0700 In: 2351.6 [I.V.:786.6; NG/GT:1265; IV Piggyback:300.1] Out: 2350 [Urine:2350]  Intake/Output this shift: Total I/O In: 195.8 [I.V.:107.2; IV Piggyback:88.6] Out: 500 [Urine:500]  Vent settings for last 24 hours: Vent Mode: PRVC FiO2 (%):  [40 %] 40 % Set Rate:  [18 bmp] 18 bmp Vt  Set:  [560 mL] 560 mL PEEP:  [5 cmH20] 5 cmH20 Plateau Pressure:  [15 WHQ75-91 cmH20] 17 cmH20  Physical Exam:  General: on vent Neuro: sedated HEENT/Neck: ETT Resp: clear to auscultation bilaterally CVS: RRR GI: soft, NT Extremities: edema 1+  Results for orders placed or performed during the hospital encounter of 06/15/21 (from the past 24 hour(s))  Glucose, capillary     Status: Abnormal   Collection Time: 06/26/21 11:36 AM  Result Value Ref Range   Glucose-Capillary 163 (H) 70 - 99 mg/dL  Glucose, capillary     Status: Abnormal   Collection Time: 06/26/21  3:24 PM  Result Value Ref Range   Glucose-Capillary 176 (H)  70 - 99 mg/dL  Glucose, capillary     Status: Abnormal   Collection Time: 06/26/21  7:45 PM  Result Value Ref Range   Glucose-Capillary 250 (H) 70 - 99 mg/dL  Glucose, capillary     Status: Abnormal   Collection Time: 06/26/21 11:46 PM  Result Value Ref Range   Glucose-Capillary 131 (H) 70 - 99 mg/dL  Glucose, capillary     Status: Abnormal   Collection Time: 06/27/21  3:54 AM  Result Value Ref Range   Glucose-Capillary 172 (H) 70 - 99 mg/dL  Triglycerides     Status: None   Collection Time: 06/27/21  5:06 AM  Result Value Ref Range   Triglycerides 78 <150 mg/dL  CBC     Status: Abnormal   Collection Time: 06/27/21  5:06 AM  Result Value Ref Range   WBC 8.7 4.0 - 10.5 K/uL   RBC 4.11 (L) 4.22 - 5.81 MIL/uL   Hemoglobin 13.3 13.0 - 17.0 g/dL   HCT 40.7 39.0 - 52.0 %   MCV 99.0 80.0 - 100.0 fL   MCH 32.4 26.0 - 34.0 pg   MCHC 32.7 30.0 - 36.0 g/dL   RDW 11.5 11.5 - 15.5 %   Platelets 246 150 - 400 K/uL   nRBC 0.0 0.0 - 0.2 %  Basic metabolic panel     Status: Abnormal   Collection Time: 06/27/21  5:06 AM  Result Value Ref Range   Sodium 137 135 - 145 mmol/L   Potassium 4.4 3.5 - 5.1 mmol/L   Chloride 104 98 - 111 mmol/L   CO2 24 22 - 32 mmol/L   Glucose, Bld 182 (H) 70 - 99 mg/dL   BUN 24 (H) 8 - 23 mg/dL   Creatinine, Ser 0.75 0.61 - 1.24 mg/dL   Calcium 9.1 8.9 - 10.3 mg/dL   GFR, Estimated >60 >60 mL/min   Anion gap 9 5 - 15  Glucose, capillary     Status: Abnormal   Collection Time: 06/27/21  7:32 AM  Result Value Ref Range   Glucose-Capillary 213 (H) 70 - 99 mg/dL    Assessment & Plan: Present on Admission:  Skull fracture ([place])    LOS: 12 days   Additional comments:I reviewed the patient's new clinical lab test results. And CXR Found down   SAH, IVH, parenchymal contusion - NSGY c/s, Dr. Kathyrn Sheriff, repeat [facility] 9/18 w/ progression of SAH. F/U CT H evolving contusions and decreased SAH, small B hygromas.  Skull fracture - NSGY c/s, Dr.  Kathyrn Sheriff Nondisplaced facial fractures - ENT c/s, Dr. Claudia Desanctis, nonop, head of bed elevation to help with swelling, follow up outpatient Alcohol abuse - 186 on admission. CIWA, NH4 WNL VDRF - intubated for airway protection 9/22. Cont guaifenisen for secretions ID - resp cx 9/23  with S pneumo, empiric cefepime 9/24, de-escalate to cefazolin, 7d course FEN - NPO, TF, lasix x 1 VTE - SCDs, Lovenox Dispo - ICU, vent wean, lighten sedation as able In light of continuing lack of movement LUE will order MR brain and C spine. I called his son, Randall Harvey, and updated him on his progress.  Critical Care Total Time*: 34 Minutes  Georganna Skeans, MD, MPH, FACS Trauma & General Surgery Use [email or web address] to contact on call provider  06/27/2021  *Care during the described time interval was provided by me. I have reviewed this patient's available data, including medical history, events of note, physical examination and test results as part of my evaluation.

## 2021-06-27 NOTE — Progress Notes (Signed)
Nutrition Follow-up  DOCUMENTATION CODES:   Non-severe (moderate) malnutrition in context of chronic illness  INTERVENTION:   Tube feeds via Cortrak: - Increase Pivot 1.5  to 60 ml/hr (1440 ml/day)  Tube feeding regimen 2160 kcal, 135 grams of protein, and 1092 ml of H2O.   NUTRITION DIAGNOSIS:   Moderate Malnutrition related to chronic illness (COPD) as evidenced by moderate fat depletion, moderate muscle depletion. Ongoing  GOAL:   Patient will meet greater than or equal to 90% of their needs Met  MONITOR:   Diet advancement, Labs, Weight trends, TF tolerance, I & O's  REASON FOR ASSESSMENT:   Consult Enteral/tube feeding initiation and management, Assessment of nutrition requirement/status  ASSESSMENT:   60 year old male who presented to the ED on 9/17 after being found down with blood draining from L ear after unknown trauma. Pt was intoxicated. PMH of renal cell carcinoma, HTN, stroke, COPD. Pt found to have SAH, IVH, parenchymal contusion, skull fx, nondisplaced facial fx.  Pt discussed during ICU rounds and with RN.  No stool output documented  for pouch. Spoke with RN. Pt had large BMs 9/25 resulting in pouch placement. Noted stool in bag hanging at bedside.  Plastic Surgery recommending nonoperative treatment of minimally displaced facial fractures. TF at goal.  Per RN pt agitated, lasix x 1 given   9/21 cortrak; tip gastric 9/22 intubated for airway protection   Patient is currently intubated on ventilator support MV: 9.4 L/min Temp (24hrs), Avg:99.4 F (37.4 C), Min:98.3 F (36.8 C), Max:100.4 F (38 C)   Medications reviewed and include: colace, folic acid, SSI, MVI, miralax, thiamine Precedex  Fentanyl    Labs reviewed:  CBG's: 131-213  UOP: 2350 ml  I/O's: + 10 L since admit  Current TF: Pivot 1.5 @ 55 ml/hr  Tube feeding regimen 1980 kcal, 124 grams of protein,   Diet Order:   Diet Order             Diet NPO time specified  Diet  effective now                   EDUCATION NEEDS:   Not appropriate for education at this time  Skin:  Skin Assessment: Reviewed RN Assessment  Last BM:  9/28  Height:   Ht Readings from Last 1 Encounters:  06/15/21 _0  (1.753 m)    Weight:   Wt Readings from Last 1 Encounters:  06/27/21 78.9 kg    BMI:  Body mass index is 25.69 kg/m.  Estimated Nutritional Needs:   Kcal:  2000-2200  Protein:  120-135 grams  Fluid:  >/= 2.0 L   P., RD, LDN, CNSC See AMiON for contact information

## 2021-06-28 ENCOUNTER — Inpatient Hospital Stay (HOSPITAL_COMMUNITY): Payer: Medicare Other

## 2021-06-28 LAB — BASIC METABOLIC PANEL
Anion gap: 8 (ref 5–15)
BUN: 29 mg/dL — ABNORMAL HIGH (ref 8–23)
CO2: 26 mmol/L (ref 22–32)
Calcium: 9.2 mg/dL (ref 8.9–10.3)
Chloride: 105 mmol/L (ref 98–111)
Creatinine, Ser: 0.77 mg/dL (ref 0.61–1.24)
GFR, Estimated: >60 mL/min (ref >60)
Glucose, Bld: 115 mg/dL — ABNORMAL HIGH (ref 70–99)
Potassium: 3.6 mmol/L (ref 3.5–5.1)
Sodium: 139 mmol/L (ref 135–145)

## 2021-06-28 LAB — GLUCOSE, CAPILLARY
Glucose-Capillary: 120 mg/dL — ABNORMAL HIGH (ref 70–99)
Glucose-Capillary: 172 mg/dL — ABNORMAL HIGH (ref 70–99)
Glucose-Capillary: 204 mg/dL — ABNORMAL HIGH (ref 70–99)
Glucose-Capillary: 241 mg/dL — ABNORMAL HIGH (ref 70–99)
Glucose-Capillary: 167 mg/dL — ABNORMAL HIGH (ref 70–99)
Glucose-Capillary: 143 mg/dL — ABNORMAL HIGH (ref 70–99)

## 2021-06-28 LAB — CBC
HCT: 39.7 % (ref 39.0–52.0)
Hemoglobin: 12.8 g/dL — ABNORMAL LOW (ref 13.0–17.0)
MCH: 32.4 pg (ref 26.0–34.0)
MCHC: 32.2 g/dL (ref 30.0–36.0)
MCV: 100.5 fL — ABNORMAL HIGH (ref 80.0–100.0)
Platelets: 258 10*3/uL (ref 150–400)
RBC: 3.95 MIL/uL — ABNORMAL LOW (ref 4.22–5.81)
RDW: 11.4 % — ABNORMAL LOW (ref 11.5–15.5)
WBC: 8.4 10*3/uL (ref 4.0–10.5)
nRBC: 0 % (ref 0.0–0.2)

## 2021-06-28 MED ORDER — FUROSEMIDE 10 MG/ML IJ SOLN
20.0000 mg | Freq: Once | INTRAMUSCULAR | Status: AC
  Administered 2021-06-28: 20 mg via INTRAVENOUS
  Filled 2021-06-28: qty 2

## 2021-06-28 NOTE — Progress Notes (Signed)
Subjective/Chief Complaint: Pt with no acute changes   Objective: Vital signs in last 24 hours: Temp:  [98.6 F (37 C)-98.9 F (37.2 C)] 98.6 F (37 C) (09/29 1800) Pulse Rate:  [47-66] 50 (09/30 0600) Resp:  [16-22] 18 (09/30 0600) BP: (94-138)/(56-78) 95/63 (09/30 0600) SpO2:  [95 %-100 %] 97 % (09/30 0600) FiO2 (%):  [30 %-40 %] 30 % (09/30 0338) Last BM Date: 06/27/21  Intake/Output from previous day: 09/29 0701 - 09/30 0700 In: 2074.2 [I.V.:514.9; NG/GT:1359.7; IV Piggyback:199.7] Out: 1400 [Urine:1300; Stool:100] Intake/Output this shift: No intake/output data recorded.  Physical Exam:  General: on vent Neuro: sedated HEENT/Neck: ETT Resp: clear to auscultation bilaterally CVS: RRR GI: soft, NT Extremities: edema 1+   Lab Results:  Recent Labs    06/27/21 0506 06/28/21 0356  WBC 8.7 8.4  HGB 13.3 12.8*  HCT 40.7 39.7  PLT 246 258   BMET Recent Labs    06/27/21 0506 06/28/21 0356  NA 137 139  K 4.4 3.6  CL 104 105  CO2 24 26  GLUCOSE 182* 115*  BUN 24* 29*  CREATININE 0.75 0.77  CALCIUM 9.1 9.2   PT/INR No results for input(s): LABPROT, INR in the last 72 hours. ABG No results for input(s): PHART, HCO3 in the last 72 hours.  Invalid input(s): PCO2, PO2  Studies/Results: MR BRAIN WO CONTRAST  Result Date: 06/27/2021 CLINICAL DATA:  Head trauma follow-up EXAM: MRI HEAD WITHOUT CONTRAST TECHNIQUE: Multiplanar, multiecho pulse sequences of the brain and surrounding structures were obtained without intravenous contrast. COMPARISON:  CT head 06/23/2021 FINDINGS: Brain: Progression of bilateral subdural fluid collections measuring approximately 8 mm in thickness bilaterally. These are predominately CSF but do contain a small amount of blood. There is a small amount of intraventricular hemorrhage. Ventricle size is normal. No midline shift. There is edema in the right inferior temporal lobe compatible with contusion. No associated hemorrhage.  Small hemorrhagic contusion right lateral frontal lobe. Negative for acute infarct. Vascular: Normal arterial flow voids at the skull base. Skull and upper cervical spine: Skull base fractures by CT. Refer to prior reports. Sinuses/Orbits: Mucosal edema throughout the paranasal sinuses. Extensive bilateral mastoid effusion. Negative orbit Other: None IMPRESSION: Progressive bilateral subdural fluid collections which are predominately CSF and contain a small amount of hemorrhage. Mild intraventricular hemorrhage. No hydrocephalus. Right temporal lobe contusion without hemorrhage. Right lateral frontal hemorrhagic contusion. Electronically Signed   By: Franchot Gallo M.D.   On: 06/27/2021 15:16   MR CERVICAL SPINE WO CONTRAST  Result Date: 06/27/2021 CLINICAL DATA:  Polytrauma EXAM: MRI CERVICAL SPINE WITHOUT CONTRAST TECHNIQUE: Multiplanar, multisequence MR imaging of the cervical spine was performed. No intravenous contrast was administered. COMPARISON:  Cervical spine CT 06/15/2021 FINDINGS: Alignment: Normal. Vertebrae: Vertebral body heights are preserved, without evidence of acute fracture. Marrow signal is heterogeneous throughout, likely degenerative in nature. There is no focal or suspicious signal abnormality. Ligaments: There is no evidence of traumatic ligamentous injury. Cord/canal: There is no cord signal abnormality. There is mild indentation of the cord at C5-C6 and C6-C7. Cord morphology is otherwise normal. There is no evidence of epidural hematoma. Posterior Fossa, vertebral arteries, paraspinal tissues: The posterior fossa is assessed on the separately dictated same day brain MRI. The vertebral artery flow voids are patent. The paraspinal soft tissues are unremarkable. Disc levels: C2-C3: There is mild uncovertebral and facet arthropathy without significant spinal canal or neural foraminal stenosis. C3-C4: There is mild uncovertebral and bilateral facet arthropathy resulting in mild  right and  no significant left neural foraminal stenosis without significant spinal canal stenosis. C4-C5: There is a posterior disc osteophyte complex with a prominent central disc protrusion and mild uncovertebral and facet arthropathy resulting in mild spinal canal stenosis with slight indentation of the ventral cord and mild left worse than right neural foraminal stenosis. C5-C6: There is a prominent posterior disc osteophyte complex with a broad-based disc protrusion eccentric to the left and uncovertebral and facet arthropathy resulting in moderate spinal canal stenosis with effacement of the dorsal and ventral CSF space without evidence of cord compression and severe left and moderate right neural foraminal stenosis. C6-C7: There is uncovertebral and facet arthropathy resulting in severe left worse than right neural foraminal stenosis without significant spinal canal stenosis. C7-T1: No significant spinal canal or neural foraminal stenosis. Other: Endotracheal and enteric catheters are noted. There is fluid throughout the imaged airway. There are numerous prominent cervical chain and supraclavicular lymph nodes bilaterally individually measuring up to 1.2 cm on the right at level IV. IMPRESSION: 1. No evidence of traumatic injury in the cervical spine. 2. Multilevel degenerative changes of the cervical spine detailed above, most advanced at C5-C6 and C6-C7, with up to moderate spinal canal stenosis at C5-C6. 3. Numerous prominent to enlarged bilateral cervical chain and supraclavicular lymph nodes. These are nonspecific, but recommend further workup and possible tissue sampling on an outpatient basis once the patient's acute condition has improved. Electronically Signed   By: Valetta Mole M.D.   On: 06/27/2021 15:42   DG CHEST PORT 1 VIEW  Result Date: 06/28/2021 CLINICAL DATA:  Respiratory failure EXAM: PORTABLE CHEST 1 VIEW COMPARISON:  06/27/2021 FINDINGS: Support devices are unchanged. Low lung volumes. Right  basilar atelectasis or infiltrate slightly increased since prior study. No confluent opacity on the left. Heart is normal size. Possible small right effusion. IMPRESSION: Low lung volumes. Increasing right basilar atelectasis or infiltrate with small right effusion. Electronically Signed   By: Rolm Baptise M.D.   On: 06/28/2021 07:18   DG CHEST PORT 1 VIEW  Result Date: 06/27/2021 CLINICAL DATA:  Evaluate lung fields EXAM: PORTABLE CHEST 1 VIEW COMPARISON:  Chest x-ray dated September 03/18/2021 FINDINGS: ETT tip is near the carina, near the proximal right mainstem bronchus. Feeding tube partially visualized coursing below the diaphragm. Interval placement of right arm PICC with tip positioned in the right atrium. Mild bilateral heterogeneous opacities are unchanged compared to prior, likely due to atelectasis. No large pleural effusion or pneumothorax. IMPRESSION: ETT tip is near the carina possibly in the proximal right mainstem bronchus, recommend retraction. Interval placement of right arm PICC with tip in the right atrium. Results were called by telephone at the time of interpretation on 06/27/2021 at 8:52 am to provider [facility] , who verbally acknowledged these results. Electronically Signed   By: Yetta Glassman M.D.   On: 06/27/2021 08:53    Anti-infectives: Anti-infectives (From admission, onward)    Start     Dose/Rate Route Frequency Ordered Stop   06/24/21 1800  ceFAZolin (ANCEF) IVPB 2g/100 mL premix        2 g 200 mL/hr over 30 Minutes Intravenous Every 8 hours 06/24/21 1116 06/29/21 0159   06/22/21 1100  ceFEPIme (MAXIPIME) 2 g in sodium chloride 0.9 % 100 mL IVPB  Status:  Discontinued        2 g 200 mL/hr over 30 Minutes Intravenous Every 8 hours 06/22/21 0957 06/24/21 1116       Assessment/Plan: Found down  SAH, IVH, parenchymal contusion - NSGY c/s, Dr. Kathyrn Sheriff, repeat [facility] 9/18 w/ progression of SAH. F/U CT H evolving contusions and decreased SAH, small B hygromas.   Skull fracture - NSGY c/s, Dr. Kathyrn Sheriff Nondisplaced facial fractures - ENT c/s, Dr. Claudia Desanctis, nonop, head of bed elevation to help with swelling, follow up outpatient Alcohol abuse - 186 on admission. CIWA, NH4 WNL VDRF - intubated for airway protection 9/22. Cont guaifenisen for secretions ID - resp cx 9/23 with S pneumo, empiric cefepime 9/24, de-escalate to cefazolin, 7d course FEN - NPO, TF, lasix x 1 VTE - SCDs, Lovenox Dispo - ICU, vent wean, lighten sedation as able In light of continuing lack of movement LUE will order MR brain results noted and C spine- w/o injury.    Critical Care Total Time*: 30 Minutes  LOS: 13 days    Ralene Ok 06/28/2021

## 2021-06-29 ENCOUNTER — Inpatient Hospital Stay (HOSPITAL_COMMUNITY): Payer: Medicare Other

## 2021-06-29 LAB — BASIC METABOLIC PANEL
Anion gap: 9 (ref 5–15)
BUN: 32 mg/dL — ABNORMAL HIGH (ref 8–23)
CO2: 24 mmol/L (ref 22–32)
Calcium: 9.2 mg/dL (ref 8.9–10.3)
Chloride: 104 mmol/L (ref 98–111)
Creatinine, Ser: 0.69 mg/dL (ref 0.61–1.24)
GFR, Estimated: >60 mL/min (ref >60)
Glucose, Bld: 132 mg/dL — ABNORMAL HIGH (ref 70–99)
Potassium: 4.1 mmol/L (ref 3.5–5.1)
Sodium: 137 mmol/L (ref 135–145)

## 2021-06-29 LAB — GLUCOSE, CAPILLARY
Glucose-Capillary: 149 mg/dL — ABNORMAL HIGH (ref 70–99)
Glucose-Capillary: 184 mg/dL — ABNORMAL HIGH (ref 70–99)
Glucose-Capillary: 145 mg/dL — ABNORMAL HIGH (ref 70–99)
Glucose-Capillary: 217 mg/dL — ABNORMAL HIGH (ref 70–99)
Glucose-Capillary: 155 mg/dL — ABNORMAL HIGH (ref 70–99)
Glucose-Capillary: 160 mg/dL — ABNORMAL HIGH (ref 70–99)

## 2021-06-29 LAB — CBC
HCT: 39.4 % (ref 39.0–52.0)
Hemoglobin: 12.8 g/dL — ABNORMAL LOW (ref 13.0–17.0)
MCH: 32.7 pg (ref 26.0–34.0)
MCHC: 32.5 g/dL (ref 30.0–36.0)
MCV: 100.5 fL — ABNORMAL HIGH (ref 80.0–100.0)
Platelets: 274 10*3/uL (ref 150–400)
RBC: 3.92 MIL/uL — ABNORMAL LOW (ref 4.22–5.81)
RDW: 11.5 % (ref 11.5–15.5)
WBC: 7.4 10*3/uL (ref 4.0–10.5)
nRBC: 0 % (ref 0.0–0.2)

## 2021-06-29 MED ORDER — MIDAZOLAM HCL 2 MG/2ML IJ SOLN
2.0000 mg | INTRAMUSCULAR | Status: DC | PRN
  Administered 2021-07-01 – 2021-07-03 (×4): 2 mg via INTRAVENOUS
  Filled 2021-06-29 (×6): qty 2

## 2021-06-29 MED ORDER — IOHEXOL 350 MG/ML SOLN
75.0000 mL | Freq: Once | INTRAVENOUS | Status: AC | PRN
  Administered 2021-06-29: 75 mL via INTRAVENOUS

## 2021-06-29 MED ORDER — MIDAZOLAM HCL 2 MG/2ML IJ SOLN
2.0000 mg | INTRAMUSCULAR | Status: AC | PRN
  Administered 2021-06-29 – 2021-06-30 (×3): 2 mg via INTRAVENOUS
  Filled 2021-06-29 (×3): qty 2

## 2021-06-29 NOTE — Progress Notes (Signed)
Subjective/Chief Complaint: Pt with no acute changes   Objective: Vital signs in last 24 hours: Temp:  [98.1 F (36.7 C)-99.6 F (37.6 C)] 99.6 F (37.6 C) (10/01 0800) Pulse Rate:  [51-84] 55 (10/01 0800) Resp:  [16-19] 18 (10/01 0800) BP: (90-126)/(54-88) 97/58 (10/01 0800) SpO2:  [91 %-100 %] 97 % (10/01 0825) FiO2 (%):  [30 %-40 %] 40 % (10/01 0825) Last BM Date: 06/28/21  Intake/Output from previous day: 09/30 0701 - 10/01 0700 In: 2242.3 [I.V.:542.4; NG/GT:1500; IV Piggyback:199.9] Out: 1550 [Urine:1450; Stool:100] Intake/Output this shift: Total I/O In: 684.4 [I.V.:564.4; NG/GT:120] Out: 800 [Urine:800]  Physical Exam:  General: on vent Neuro: sedated HEENT/Neck: ETT Resp: clear to auscultation bilaterally CVS: RRR GI: soft, NT Extremities: edema 1+, Decreased LUE mvmt  Lab Results:  Recent Labs    06/28/21 0356 06/29/21 0606  WBC 8.4 7.4  HGB 12.8* 12.8*  HCT 39.7 39.4  PLT 258 274   BMET Recent Labs    06/27/21 0506 06/28/21 0356  NA 137 139  K 4.4 3.6  CL 104 105  CO2 24 26  GLUCOSE 182* 115*  BUN 24* 29*  CREATININE 0.75 0.77  CALCIUM 9.1 9.2   PT/INR No results for input(s): LABPROT, INR in the last 72 hours. ABG No results for input(s): PHART, HCO3 in the last 72 hours.  Invalid input(s): PCO2, PO2  Studies/Results: MR BRAIN WO CONTRAST  Result Date: 06/27/2021 CLINICAL DATA:  Head trauma follow-up EXAM: MRI HEAD WITHOUT CONTRAST TECHNIQUE: Multiplanar, multiecho pulse sequences of the brain and surrounding structures were obtained without intravenous contrast. COMPARISON:  CT head 06/23/2021 FINDINGS: Brain: Progression of bilateral subdural fluid collections measuring approximately 8 mm in thickness bilaterally. These are predominately CSF but do contain a small amount of blood. There is a small amount of intraventricular hemorrhage. Ventricle size is normal. No midline shift. There is edema in the right inferior temporal lobe  compatible with contusion. No associated hemorrhage. Small hemorrhagic contusion right lateral frontal lobe. Negative for acute infarct. Vascular: Normal arterial flow voids at the skull base. Skull and upper cervical spine: Skull base fractures by CT. Refer to prior reports. Sinuses/Orbits: Mucosal edema throughout the paranasal sinuses. Extensive bilateral mastoid effusion. Negative orbit Other: None IMPRESSION: Progressive bilateral subdural fluid collections which are predominately CSF and contain a small amount of hemorrhage. Mild intraventricular hemorrhage. No hydrocephalus. Right temporal lobe contusion without hemorrhage. Right lateral frontal hemorrhagic contusion. Electronically Signed   By: Franchot Gallo M.D.   On: 06/27/2021 15:16   MR CERVICAL SPINE WO CONTRAST  Result Date: 06/27/2021 CLINICAL DATA:  Polytrauma EXAM: MRI CERVICAL SPINE WITHOUT CONTRAST TECHNIQUE: Multiplanar, multisequence MR imaging of the cervical spine was performed. No intravenous contrast was administered. COMPARISON:  Cervical spine CT 06/15/2021 FINDINGS: Alignment: Normal. Vertebrae: Vertebral body heights are preserved, without evidence of acute fracture. Marrow signal is heterogeneous throughout, likely degenerative in nature. There is no focal or suspicious signal abnormality. Ligaments: There is no evidence of traumatic ligamentous injury. Cord/canal: There is no cord signal abnormality. There is mild indentation of the cord at C5-C6 and C6-C7. Cord morphology is otherwise normal. There is no evidence of epidural hematoma. Posterior Fossa, vertebral arteries, paraspinal tissues: The posterior fossa is assessed on the separately dictated same day brain MRI. The vertebral artery flow voids are patent. The paraspinal soft tissues are unremarkable. Disc levels: C2-C3: There is mild uncovertebral and facet arthropathy without significant spinal canal or neural foraminal stenosis. C3-C4: There is mild uncovertebral  and  bilateral facet arthropathy resulting in mild right and no significant left neural foraminal stenosis without significant spinal canal stenosis. C4-C5: There is a posterior disc osteophyte complex with a prominent central disc protrusion and mild uncovertebral and facet arthropathy resulting in mild spinal canal stenosis with slight indentation of the ventral cord and mild left worse than right neural foraminal stenosis. C5-C6: There is a prominent posterior disc osteophyte complex with a broad-based disc protrusion eccentric to the left and uncovertebral and facet arthropathy resulting in moderate spinal canal stenosis with effacement of the dorsal and ventral CSF space without evidence of cord compression and severe left and moderate right neural foraminal stenosis. C6-C7: There is uncovertebral and facet arthropathy resulting in severe left worse than right neural foraminal stenosis without significant spinal canal stenosis. C7-T1: No significant spinal canal or neural foraminal stenosis. Other: Endotracheal and enteric catheters are noted. There is fluid throughout the imaged airway. There are numerous prominent cervical chain and supraclavicular lymph nodes bilaterally individually measuring up to 1.2 cm on the right at level IV. IMPRESSION: 1. No evidence of traumatic injury in the cervical spine. 2. Multilevel degenerative changes of the cervical spine detailed above, most advanced at C5-C6 and C6-C7, with up to moderate spinal canal stenosis at C5-C6. 3. Numerous prominent to enlarged bilateral cervical chain and supraclavicular lymph nodes. These are nonspecific, but recommend further workup and possible tissue sampling on an outpatient basis once the patient's acute condition has improved. Electronically Signed   By: Valetta Mole M.D.   On: 06/27/2021 15:42   DG CHEST PORT 1 VIEW  Result Date: 06/28/2021 CLINICAL DATA:  Respiratory failure EXAM: PORTABLE CHEST 1 VIEW COMPARISON:  06/27/2021 FINDINGS:  Support devices are unchanged. Low lung volumes. Right basilar atelectasis or infiltrate slightly increased since prior study. No confluent opacity on the left. Heart is normal size. Possible small right effusion. IMPRESSION: Low lung volumes. Increasing right basilar atelectasis or infiltrate with small right effusion. Electronically Signed   By: Rolm Baptise M.D.   On: 06/28/2021 07:18    Anti-infectives: Anti-infectives (From admission, onward)    Start     Dose/Rate Route Frequency Ordered Stop   06/24/21 1800  ceFAZolin (ANCEF) IVPB 2g/100 mL premix        2 g 200 mL/hr over 30 Minutes Intravenous Every 8 hours 06/24/21 1116 06/28/21 1849   06/22/21 1100  ceFEPIme (MAXIPIME) 2 g in sodium chloride 0.9 % 100 mL IVPB  Status:  Discontinued        2 g 200 mL/hr over 30 Minutes Intravenous Every 8 hours 06/22/21 0957 06/24/21 1116       Assessment/Plan: Found down   SAH, IVH, parenchymal contusion - NSGY c/s, Dr. Kathyrn Sheriff, repeat [facility] 9/18 w/ progression of SAH. F/U CT H evolving contusions and decreased SAH, small B hygromas.  Skull fracture - NSGY c/s, Dr. Kathyrn Sheriff Nondisplaced facial fractures - ENT c/s, Dr. Claudia Desanctis, nonop, head of bed elevation to help with swelling, follow up outpatient Alcohol abuse - 186 on admission. CIWA, NH4 WNL VDRF - intubated for airway protection 9/22. Cont guaifenisen for secretions, wean vent as tol ID - resp cx 9/23 with S pneumo, empiric cefepime 9/24, de-escalate to cefazolin, 7d course FEN - NPO, TF, lasix x 1 VTE - SCDs, Lovenox Dispo - ICU, vent wean, lighten sedation as able In light of continuing lack of movement LUE will order CTA angio neck to eval for any possible injury creating weakness.   LOS: 14  days    Ralene Ok 06/29/2021

## 2021-06-29 NOTE — Progress Notes (Signed)
Patient transported to CT & back on the ventilator with no problems.

## 2021-06-29 NOTE — Progress Notes (Signed)
Gedalia Mcmillon (son) [phone] is to be patient's first point of contact. If he is unable to talk, please call patient's daughter, Macio Kissoon at [phone]. I spoke to both children at bedside at this time.   Montez Hageman RN

## 2021-06-30 LAB — CBC
HCT: 39.5 % (ref 39.0–52.0)
Hemoglobin: 12.8 g/dL — ABNORMAL LOW (ref 13.0–17.0)
MCH: 32.6 pg (ref 26.0–34.0)
MCHC: 32.4 g/dL (ref 30.0–36.0)
MCV: 100.5 fL — ABNORMAL HIGH (ref 80.0–100.0)
Platelets: 285 10*3/uL (ref 150–400)
RBC: 3.93 MIL/uL — ABNORMAL LOW (ref 4.22–5.81)
RDW: 11.1 % — ABNORMAL LOW (ref 11.5–15.5)
WBC: 8.2 10*3/uL (ref 4.0–10.5)
nRBC: 0 % (ref 0.0–0.2)

## 2021-06-30 LAB — BASIC METABOLIC PANEL
Anion gap: 8 (ref 5–15)
BUN: 28 mg/dL — ABNORMAL HIGH (ref 8–23)
CO2: 25 mmol/L (ref 22–32)
Calcium: 8.7 mg/dL — ABNORMAL LOW (ref 8.9–10.3)
Chloride: 106 mmol/L (ref 98–111)
Creatinine, Ser: 0.67 mg/dL (ref 0.61–1.24)
GFR, Estimated: >60 mL/min (ref >60)
Glucose, Bld: 193 mg/dL — ABNORMAL HIGH (ref 70–99)
Potassium: 3.8 mmol/L (ref 3.5–5.1)
Sodium: 139 mmol/L (ref 135–145)

## 2021-06-30 LAB — GLUCOSE, CAPILLARY
Glucose-Capillary: 137 mg/dL — ABNORMAL HIGH (ref 70–99)
Glucose-Capillary: 168 mg/dL — ABNORMAL HIGH (ref 70–99)
Glucose-Capillary: 175 mg/dL — ABNORMAL HIGH (ref 70–99)
Glucose-Capillary: 182 mg/dL — ABNORMAL HIGH (ref 70–99)
Glucose-Capillary: 197 mg/dL — ABNORMAL HIGH (ref 70–99)
Glucose-Capillary: 202 mg/dL — ABNORMAL HIGH (ref 70–99)

## 2021-06-30 NOTE — Progress Notes (Signed)
Subjective/Chief Complaint: Patient with no acute changes    Objective: Vital signs in last 24 hours: Temp:  [97.7 F (36.5 C)-98.8 F (37.1 C)] 98.8 F (37.1 C) (10/02 1200) Pulse Rate:  [54-66] 54 (10/02 1200) Resp:  [8-18] 18 (10/02 1200) BP: (97-128)/(61-74) 112/67 (10/02 1200) SpO2:  [95 %-100 %] 99 % (10/02 1200) FiO2 (%):  [40 %] 40 % (10/02 1104) Weight:  [82.2 kg] 82.2 kg (10/02 0400) Last BM Date: 06/30/21  Intake/Output from previous day: 10/01 0701 - 10/02 0700 In: 1739.6 [I.V.:1079.6; NG/GT:660] Out: 2550 [Urine:2300; Stool:250] Intake/Output this shift: Total I/O In: 1283.9 [I.V.:443.9; NG/GT:840] Out: -   Physical Exam:  General: on vent, f/c Neuro: sedated HEENT/Neck: ETT Resp: clear to auscultation bilaterally CVS: RRR GI: soft, NT Extremities: edema 1+, LUE mvmt min  Lab Results:  Recent Labs    06/29/21 0606 06/30/21 0555  WBC 7.4 8.2  HGB 12.8* 12.8*  HCT 39.4 39.5  PLT 274 285   BMET Recent Labs    06/29/21 0606 06/30/21 0555  NA 137 139  K 4.1 3.8  CL 104 106  CO2 24 25  GLUCOSE 132* 193*  BUN 32* 28*  CREATININE 0.69 0.67  CALCIUM 9.2 8.7*   PT/INR No results for input(s): LABPROT, INR in the last 72 hours. ABG No results for input(s): PHART, HCO3 in the last 72 hours.  Invalid input(s): PCO2, PO2  Studies/Results: CT ANGIO NECK W OR WO CONTRAST  Result Date: 06/29/2021 CLINICAL DATA:  Traumatic brain injury. Blood coming from left ear. Follow-up CTA of the neck. EXAM: CT ANGIOGRAPHY NECK TECHNIQUE: Multidetector CT imaging of the neck was performed using the standard protocol during bolus administration of intravenous contrast. Multiplanar CT image reconstructions and MIPs were obtained to evaluate the vascular anatomy. Carotid stenosis measurements (when applicable) are obtained utilizing NASCET criteria, using the distal internal carotid diameter as the denominator. CONTRAST:  53mL OMNIPAQUE IOHEXOL 350 MG/ML SOLN  COMPARISON:  None. FINDINGS: Aortic arch: Atheromatous calcification.  Three vessel branching Right carotid system: Atheromatous plaque to a mild degree at the bifurcation. No evidence of injury, stenosis, or ulceration. Left carotid system: Mild atheromatous plaque mainly at the bifurcation. No stenosis, ulceration, or dissection. Vertebral arteries: Proximal subclavian arteries are smoothly contoured and widely patent. Codominant vertebral arteries which show no visible injury, stenosis, or beading. Skeleton: Longitudinal left temporal bone fracture. Confluent bilateral mastoid and middle ear opacification which has progressed. Nondisplaced left squamosal temporal bone fracture extending into the parietal bone. Other neck: Generalized sinusitis. Homogeneous enlargement of cervical lymph nodes diffusely. Axillary and mediastinal nodes are likewise enlarged. Upper chest: Atelectasis and layering pleural fluid. Mild emphysema. Imaging of the head was included showing bifrontal subdural low-density collection is seen by recent brain MRI. Mild subarachnoid and intraventricular hemorrhage. Non hemorrhagic contusion in the inferior right frontal and temporal lobes as seen by brain MRI. No hydrocephalus or midline shift. IMPRESSION: 1. No evidence of acute arterial injury in the neck. 2. No progression of intracranial hemorrhage/collection since 06/27/2021. 3. Calvarial and skull base fracturing including the left temporal bone. Progressive, confluent bilateral mastoid and middle ear opacification. 4. Generalized lymphadenopathy, chronic based on remote imaging, suspicious for CLL. Electronically Signed   By: Jorje Guild M.D.   On: 06/29/2021 11:30    Anti-infectives: Anti-infectives (From admission, onward)    Start     Dose/Rate Route Frequency Ordered Stop   06/24/21 1800  ceFAZolin (ANCEF) IVPB 2g/100 mL premix  2 g 200 mL/hr over 30 Minutes Intravenous Every 8 hours 06/24/21 1116 06/28/21 1849    06/22/21 1100  ceFEPIme (MAXIPIME) 2 g in sodium chloride 0.9 % 100 mL IVPB  Status:  Discontinued        2 g 200 mL/hr over 30 Minutes Intravenous Every 8 hours 06/22/21 0957 06/24/21 1116       Assessment/Plan: Found down   SAH, IVH, parenchymal contusion - NSGY c/s, Dr. Kathyrn Sheriff, repeat [facility] 9/18 w/ progression of SAH. F/U CT H evolving contusions and decreased SAH, small B hygromas.  Skull fracture - NSGY c/s, Dr. Kathyrn Sheriff Nondisplaced facial fractures - ENT c/s, Dr. Claudia Desanctis, nonop, head of bed elevation to help with swelling, follow up outpatient Alcohol abuse - 186 on admission. CIWA, NH4 WNL VDRF - intubated for airway protection 9/22. Cont guaifenisen for secretions, wean vent as tol ID - resp cx 9/23 with S pneumo, empiric cefepime 9/24, de-escalate to cefazolin, 7d course FEN - NPO, TF, lasix x 1 VTE - SCDs, Lovenox Dispo - ICU, vent wean, lighten sedation as able    LOS: 15 days    Ralene Ok 06/30/2021

## 2021-07-01 LAB — GLUCOSE, CAPILLARY
Glucose-Capillary: 105 mg/dL — ABNORMAL HIGH (ref 70–99)
Glucose-Capillary: 129 mg/dL — ABNORMAL HIGH (ref 70–99)
Glucose-Capillary: 162 mg/dL — ABNORMAL HIGH (ref 70–99)
Glucose-Capillary: 164 mg/dL — ABNORMAL HIGH (ref 70–99)
Glucose-Capillary: 166 mg/dL — ABNORMAL HIGH (ref 70–99)
Glucose-Capillary: 193 mg/dL — ABNORMAL HIGH (ref 70–99)

## 2021-07-01 MED ORDER — OXYCODONE HCL 5 MG PO TABS
10.0000 mg | ORAL_TABLET | Freq: Four times a day (QID) | ORAL | Status: DC
  Administered 2021-07-01 – 2021-07-11 (×29): 10 mg
  Filled 2021-07-01 (×29): qty 2

## 2021-07-01 MED ORDER — QUETIAPINE FUMARATE 200 MG PO TABS
200.0000 mg | ORAL_TABLET | Freq: Two times a day (BID) | ORAL | Status: DC
  Administered 2021-07-01 – 2021-07-03 (×5): 200 mg
  Filled 2021-07-01 (×5): qty 1

## 2021-07-01 NOTE — Progress Notes (Signed)
Patient ID: Randall Harvey., male   DOB: [birth year], 60 y.o.   MRN: [id number] Follow up - Trauma Critical Care  Patient Details:    Randall Harvey. is an 60 y.o. male.  Lines/tubes : Airway 7.5 mm (Active)  Secured at (cm) 24 cm 07/01/21 0818  Measured From Lips 07/01/21 0818  Secured Location Left 07/01/21 0818  Secured By [employer] 07/01/21 0818  Tube Holder Repositioned Yes 07/01/21 0818  Prone position No 07/01/21 0818  Cuff Pressure (cm H2O) Red OR 40-60 CmH2O 07/01/21 0818  Site Condition Dry 07/01/21 0818     PICC Double Lumen 73/53/29 PICC Right Basilic 40 cm 2 cm (Active)  Indication for Insertion or Continuance of Line Prolonged intravenous therapies 07/01/21 0800  Exposed Catheter (cm) 2 cm 06/26/21 0000  Site Assessment Clean;Dry;Intact 07/01/21 0800  Lumen #1 Status Infusing;Flushed;In-line blood sampling system in place 07/01/21 0800  Lumen #2 Status Infusing;Flushed 07/01/21 0800  Dressing Type Transparent 07/01/21 0800  Dressing Status Clean;Dry;Intact 07/01/21 0800  Antimicrobial disc in place? Yes 07/01/21 0800  Safety Lock Not Applicable 92/42/68 3419  Line Care Connections checked and tightened;Line pulled back 07/01/21 0800  Line Adjustment (NICU/IV Team Only) No 06/26/21 0800  Dressing Intervention Dressing reinforced 06/26/21 0800  Dressing Change Due 07/03/21 07/01/21 0800     External Urinary Catheter (Active)  Collection Container Standard drainage bag 07/01/21 0800  Suction (Verified suction is between 40-80 mmHg) N/A (Patient has condom catheter) 07/01/21 0800  Securement Method Tape 07/01/21 0800  Site Assessment Clean;Intact 07/01/21 0800  Output (mL) 350 mL 07/01/21 0629     Fecal Management System (Active)  Does patient meet criteria for removal? No 07/01/21 0800  Daily care Skin around tube assessed;Skin barrier applied to rectal area;Assess location of position indicator line 07/01/21 0800  Output (mL) 250 mL 06/30/21 6222     Microbiology/Sepsis markers: Results for orders placed or performed during the hospital encounter of 06/15/21  Resp Panel by RT-PCR (Flu A&B, Covid) Nasopharyngeal Swab     Status: None   Collection Time: 06/15/21 10:40 PM   Specimen: Nasopharyngeal Swab; Nasopharyngeal(NP) swabs in vial transport medium  Result Value Ref Range Status   SARS Coronavirus 2 by RT PCR NEGATIVE NEGATIVE Final    Comment: (NOTE) SARS-CoV-2 target nucleic acids are NOT DETECTED.  The SARS-CoV-2 RNA is generally detectable in upper respiratory specimens during the acute phase of infection. The lowest concentration of SARS-CoV-2 viral copies this assay can detect is 138 copies/mL. A negative result does not preclude SARS-Cov-2 infection and should not be used as the sole basis for treatment or other patient management decisions. A negative result may occur with  improper specimen collection/handling, submission of specimen other than nasopharyngeal swab, presence of viral mutation(s) within the areas targeted by this assay, and inadequate number of viral copies(<138 copies/mL). A negative result must be combined with clinical observations, patient history, and epidemiological information. The expected result is Negative.  Fact Sheet for Patients:  EntrepreneurPulse.com.au  Fact Sheet for Healthcare Providers:  IncredibleEmployment.be  This test is no t yet approved or cleared by the [place] FDA and  has been authorized for detection and/or diagnosis of SARS-CoV-2 by FDA under an Emergency Use Authorization (EUA). This EUA will remain  in effect (meaning this test can be used) for the duration of the COVID-19 declaration under Section 564(b)(1) of the Act, 21 U.S.C.section 360bbb-3(b)(1), unless the authorization is terminated  or revoked sooner.  Influenza A by PCR NEGATIVE NEGATIVE Final   Influenza B by PCR NEGATIVE NEGATIVE Final    Comment:  (NOTE) The Xpert Xpress SARS-CoV-2/FLU/RSV plus assay is intended as an aid in the diagnosis of influenza from Nasopharyngeal swab specimens and should not be used as a sole basis for treatment. Nasal washings and aspirates are unacceptable for Xpert Xpress SARS-CoV-2/FLU/RSV testing.  Fact Sheet for Patients: EntrepreneurPulse.com.au  Fact Sheet for Healthcare Providers: IncredibleEmployment.be  This test is not yet approved or cleared by the [place] FDA and has been authorized for detection and/or diagnosis of SARS-CoV-2 by FDA under an Emergency Use Authorization (EUA). This EUA will remain in effect (meaning this test can be used) for the duration of the COVID-19 declaration under Section 564(b)(1) of the Act, 21 U.S.C. section 360bbb-3(b)(1), unless the authorization is terminated or revoked.  Performed at [facility], [place] [address] [place], [place] [address]   Culture, Respiratory w Gram Stain     Status: None   Collection Time: 06/21/21  9:11 AM   Specimen: Tracheal Aspirate; Respiratory  Result Value Ref Range Status   Specimen Description TRACHEAL ASPIRATE  Final   Special Requests NONE  Final   Gram Stain   Final    ABUNDANT WBC PRESENT, PREDOMINANTLY PMN MODERATE GRAM POSITIVE COCCI ABUNDANT GRAM NEGATIVE RODS Performed at [facility], [place] [address], [place], [place] [address]    Culture ABUNDANT STREPTOCOCCUS PNEUMONIAE  Final   Report Status 06/24/2021 FINAL  Final   Organism ID, Bacteria STREPTOCOCCUS PNEUMONIAE  Final      Susceptibility   Streptococcus pneumoniae - MIC*    ERYTHROMYCIN <=0.12 SENSITIVE Sensitive     LEVOFLOXACIN 0.5 SENSITIVE Sensitive     VANCOMYCIN 0.5 SENSITIVE Sensitive     PENICILLIN (meningitis) <=0.06 SENSITIVE Sensitive     PENO - penicillin <=0.06      PENICILLIN (non-meningitis) <=0.06 SENSITIVE Sensitive     PENICILLIN (oral) <=0.06 SENSITIVE Sensitive     CEFTRIAXONE  (non-meningitis) <=0.12 SENSITIVE Sensitive     CEFTRIAXONE (meningitis) <=0.12 SENSITIVE Sensitive     * ABUNDANT STREPTOCOCCUS PNEUMONIAE    Anti-infectives:  Anti-infectives (From admission, onward)    Start     Dose/Rate Route Frequency Ordered Stop   06/24/21 1800  ceFAZolin (ANCEF) IVPB 2g/100 mL premix        2 g 200 mL/hr over 30 Minutes Intravenous Every 8 hours 06/24/21 1116 06/28/21 1849   06/22/21 1100  ceFEPIme (MAXIPIME) 2 g in sodium chloride 0.9 % 100 mL IVPB  Status:  Discontinued        2 g 200 mL/hr over 30 Minutes Intravenous Every 8 hours 06/22/21 0957 06/24/21 1116       Best Practice/Protocols:  VTE Prophylaxis: Lovenox (prophylaxtic dose) Continous Sedation  Consults:     Studies:    Events:  Subjective:    Overnight Issues:   Objective:  Vital signs for last 24 hours: Temp:  [97.7 F (36.5 C)-99.2 F (37.3 C)] 98.1 F (36.7 C) (10/03 0800) Pulse Rate:  [47-73] 73 (10/03 1000) Resp:  [14-24] 14 (10/03 1000) BP: (95-144)/(59-95) 144/68 (10/03 1000) SpO2:  [94 %-100 %] 98 % (10/03 1000) FiO2 (%):  [40 %] 40 % (10/03 1000) Weight:  [78.8 kg] 78.8 kg (10/03 0500)  Hemodynamic parameters for last 24 hours:    Intake/Output from previous day: 10/02 0701 - 10/03 0700 In: 3610 [I.V.:1450; NG/GT:2160] Out: 650 [Urine:650]  Intake/Output this shift: Total I/O  In: 398 [I.V.:158; NG/GT:240] Out: -   Vent settings for last 24 hours: Vent Mode: CPAP FiO2 (%):  [40 %] 40 % Set Rate:  [18 bmp] 18 bmp Vt Set:  [560 mL] 560 mL PEEP:  [5 cmH20] 5 cmH20 Pressure Support:  [8 cmH20] 8 cmH20 Plateau Pressure:  [18 AVW09-81 cmH20] 21 cmH20  Physical Exam:  General: on ventr wean Neuro: F/C to move LUE a bit and move BLE HEENT/Neck: ETT Resp: clear to auscultation bilaterally CVS: regular rate and rhythm, S1, S2 normal, no murmur, click, rub or gallop GI: soft, nontender, BS WNL, no r/g Extremities: edema 1+  Results for orders placed or  performed during the hospital encounter of 06/15/21 (from the past 24 hour(s))  Glucose, capillary     Status: Abnormal   Collection Time: 06/30/21 11:47 AM  Result Value Ref Range   Glucose-Capillary 168 (H) 70 - 99 mg/dL  Glucose, capillary     Status: Abnormal   Collection Time: 06/30/21  4:27 PM  Result Value Ref Range   Glucose-Capillary 175 (H) 70 - 99 mg/dL  Glucose, capillary     Status: Abnormal   Collection Time: 06/30/21  8:23 PM  Result Value Ref Range   Glucose-Capillary 202 (H) 70 - 99 mg/dL  Glucose, capillary     Status: Abnormal   Collection Time: 06/30/21 11:32 PM  Result Value Ref Range   Glucose-Capillary 137 (H) 70 - 99 mg/dL  Glucose, capillary     Status: Abnormal   Collection Time: 07/01/21  3:59 AM  Result Value Ref Range   Glucose-Capillary 193 (H) 70 - 99 mg/dL  Glucose, capillary     Status: Abnormal   Collection Time: 07/01/21  8:12 AM  Result Value Ref Range   Glucose-Capillary 166 (H) 70 - 99 mg/dL   Comment 1 Notify RN     Assessment & Plan: Present on Admission:  Skull fracture ([place])    LOS: 16 days   Additional comments:I reviewed the patient's new clinical lab test results. . Found down   SAH, IVH, parenchymal contusion - NSGY c/s, Dr. Kathyrn Sheriff, repeat [facility] 9/18 w/ progression of SAH. F/U CT H evolving contusions and decreased SAH, small B hygromas.  Skull fracture - NSGY c/s, Dr. Kathyrn Sheriff Nondisplaced facial fractures - ENT c/s, Dr. Claudia Desanctis, nonop, head of bed elevation to help with swelling, follow up outpatient Alcohol abuse - 186 on admission. CIWA, NH4 WNL VDRF - intubated for airway protection 9/22. Cont guaifenisen for secretions, weaning well today, ?trial of extubation tomorrow Generalized lymphadenopthy - will need outpatient W/U ?CLL  ID - resp cx 9/23 with S pneumo, empiric cefepime 9/24, de-escalate to cefazolin, 7d course FEN - NPO, TF VTE - SCDs, Lovenox Dispo - ICU, vent wean I called his son, Randall Harvey, and updated him on  his progress. Critical Care Total Time*: 34 Minutes  Georganna Skeans, MD, MPH, FACS Trauma & General Surgery Use [email or web address] to contact on call provider  07/01/2021  *Care during the described time interval was provided by me. I have reviewed this patient's available data, including medical history, events of note, physical examination and test results as part of my evaluation.

## 2021-07-01 NOTE — Progress Notes (Signed)
I just spoke with patient's daughter, Rondle Lohse on the phone and she told me to strictly not allow patients "girlfriend", suspected name Patty/Patricia, to visit OR to receive updates on patient over the phone. Claiborne Billings, Agricultural consultant, Network engineer, and 2N main information desk all notified. I asked the main desk to please call the RN if someone is attempting to visit patient.  Montez Hageman RN

## 2021-07-02 LAB — GLUCOSE, CAPILLARY
Glucose-Capillary: 129 mg/dL — ABNORMAL HIGH (ref 70–99)
Glucose-Capillary: 163 mg/dL — ABNORMAL HIGH (ref 70–99)
Glucose-Capillary: 172 mg/dL — ABNORMAL HIGH (ref 70–99)
Glucose-Capillary: 197 mg/dL — ABNORMAL HIGH (ref 70–99)
Glucose-Capillary: 199 mg/dL — ABNORMAL HIGH (ref 70–99)
Glucose-Capillary: 212 mg/dL — ABNORMAL HIGH (ref 70–99)

## 2021-07-02 LAB — MRSA NEXT GEN BY PCR, NASAL: MRSA by PCR Next Gen: NOT DETECTED

## 2021-07-02 MED ORDER — ORAL CARE MOUTH RINSE
15.0000 mL | Freq: Two times a day (BID) | OROMUCOSAL | Status: DC
  Administered 2021-07-03 – 2021-07-30 (×52): 15 mL via OROMUCOSAL

## 2021-07-02 MED ORDER — BETHANECHOL CHLORIDE 25 MG PO TABS
25.0000 mg | ORAL_TABLET | Freq: Three times a day (TID) | ORAL | Status: DC
  Administered 2021-07-02 – 2021-07-11 (×21): 25 mg
  Filled 2021-07-02 (×23): qty 1

## 2021-07-02 NOTE — Progress Notes (Signed)
Patient ID: Randall Girt., male   DOB: Jan 15, 1961, 60 y.o.   MRN: [id number] Follow up - Trauma Critical Care  Patient Details:    Randall Harvey. is an 60 y.o. male.  Lines/tubes : Airway 7.5 mm (Active)  Secured at (cm) 24 cm 07/02/21 0400  Measured From Lips 07/02/21 0400  Secured Location Left 07/02/21 0311  Secured By [employer] 07/02/21 0311  Tube Holder Repositioned Yes 07/02/21 0311  Prone position No 07/02/21 0311  Cuff Pressure (cm H2O) Red OR 40-60 CmH2O 07/01/21 1914  Site Condition Dry 07/02/21 0311     PICC Double Lumen 00/93/81 PICC Right Basilic 40 cm 2 cm (Active)  Indication for Insertion or Continuance of Line Prolonged intravenous therapies 07/01/21 2000  Exposed Catheter (cm) 2 cm 06/26/21 0000  Site Assessment Clean;Dry;Intact 07/01/21 2000  Lumen #1 Status Infusing;Flushed;In-line blood sampling system in place 07/01/21 2000  Lumen #2 Status Infusing;Flushed 07/01/21 2000  Dressing Type Transparent 07/01/21 2000  Dressing Status Clean;Dry;Intact 07/01/21 2000  Antimicrobial disc in place? Yes 07/01/21 2000  Safety Lock Not Applicable 82/99/37 1696  Line Care Connections checked and tightened;Line pulled back 07/01/21 2000  Line Adjustment (NICU/IV Team Only) No 06/26/21 0800  Dressing Intervention Dressing reinforced 06/26/21 0800  Dressing Change Due 07/03/21 07/01/21 2000     External Urinary Catheter (Active)  Collection Container Standard drainage bag 07/01/21 2000  Suction (Verified suction is between 40-80 mmHg) N/A (Patient has condom catheter) 07/01/21 2000  Securement Method Tape 07/01/21 2000  Site Assessment Clean;Intact 07/01/21 2000  Output (mL) 300 mL 07/01/21 1814     Fecal Management System (Active)  Does patient meet criteria for removal? Yes 07/01/21 2000  Daily care Skin around tube assessed 07/01/21 2000  Output (mL) 75 mL 07/01/21 1814    Microbiology/Sepsis markers: Results for orders placed or performed during  the hospital encounter of 06/15/21  Resp Panel by RT-PCR (Flu A&B, Covid) Nasopharyngeal Swab     Status: None   Collection Time: 06/15/21 10:40 PM   Specimen: Nasopharyngeal Swab; Nasopharyngeal(NP) swabs in vial transport medium  Result Value Ref Range Status   SARS Coronavirus 2 by RT PCR NEGATIVE NEGATIVE Final    Comment: (NOTE) SARS-CoV-2 target nucleic acids are NOT DETECTED.  The SARS-CoV-2 RNA is generally detectable in upper respiratory specimens during the acute phase of infection. The lowest concentration of SARS-CoV-2 viral copies this assay can detect is 138 copies/mL. A negative result does not preclude SARS-Cov-2 infection and should not be used as the sole basis for treatment or other patient management decisions. A negative result may occur with  improper specimen collection/handling, submission of specimen other than nasopharyngeal swab, presence of viral mutation(s) within the areas targeted by this assay, and inadequate number of viral copies(<138 copies/mL). A negative result must be combined with clinical observations, patient history, and epidemiological information. The expected result is Negative.  Fact Sheet for Patients:  EntrepreneurPulse.com.au  Fact Sheet for Healthcare Providers:  IncredibleEmployment.be  This test is no t yet approved or cleared by the [place] FDA and  has been authorized for detection and/or diagnosis of SARS-CoV-2 by FDA under an Emergency Use Authorization (EUA). This EUA will remain  in effect (meaning this test can be used) for the duration of the COVID-19 declaration under Section 564(b)(1) of the Act, 21 U.S.C.section 360bbb-3(b)(1), unless the authorization is terminated  or revoked sooner.       Influenza A by PCR NEGATIVE NEGATIVE Final  Influenza B by PCR NEGATIVE NEGATIVE Final    Comment: (NOTE) The Xpert Xpress SARS-CoV-2/FLU/RSV plus assay is intended as an aid in the  diagnosis of influenza from Nasopharyngeal swab specimens and should not be used as a sole basis for treatment. Nasal washings and aspirates are unacceptable for Xpert Xpress SARS-CoV-2/FLU/RSV testing.  Fact Sheet for Patients: EntrepreneurPulse.com.au  Fact Sheet for Healthcare Providers: IncredibleEmployment.be  This test is not yet approved or cleared by the [place] FDA and has been authorized for detection and/or diagnosis of SARS-CoV-2 by FDA under an Emergency Use Authorization (EUA). This EUA will remain in effect (meaning this test can be used) for the duration of the COVID-19 declaration under Section 564(b)(1) of the Act, 21 U.S.C. section 360bbb-3(b)(1), unless the authorization is terminated or revoked.  Performed at [facility], [place] [address] [place], [place] [address]   Culture, Respiratory w Gram Stain     Status: None   Collection Time: 06/21/21  9:11 AM   Specimen: Tracheal Aspirate; Respiratory  Result Value Ref Range Status   Specimen Description TRACHEAL ASPIRATE  Final   Special Requests NONE  Final   Gram Stain   Final    ABUNDANT WBC PRESENT, PREDOMINANTLY PMN MODERATE GRAM POSITIVE COCCI ABUNDANT GRAM NEGATIVE RODS Performed at [facility], [place] [address] [place], [place] [address]    Culture ABUNDANT STREPTOCOCCUS PNEUMONIAE  Final   Report Status 06/24/2021 FINAL  Final   Organism ID, Bacteria STREPTOCOCCUS PNEUMONIAE  Final      Susceptibility   Streptococcus pneumoniae - MIC*    ERYTHROMYCIN <=0.12 SENSITIVE Sensitive     LEVOFLOXACIN 0.5 SENSITIVE Sensitive     VANCOMYCIN 0.5 SENSITIVE Sensitive     PENICILLIN (meningitis) <=0.06 SENSITIVE Sensitive     PENO - penicillin <=0.06      PENICILLIN (non-meningitis) <=0.06 SENSITIVE Sensitive     PENICILLIN (oral) <=0.06 SENSITIVE Sensitive     CEFTRIAXONE (non-meningitis) <=0.12 SENSITIVE Sensitive     CEFTRIAXONE (meningitis) <=0.12  SENSITIVE Sensitive     * ABUNDANT STREPTOCOCCUS PNEUMONIAE    Anti-infectives:  Anti-infectives (From admission, onward)    Start     Dose/Rate Route Frequency Ordered Stop   06/24/21 1800  ceFAZolin (ANCEF) IVPB 2g/100 mL premix        2 g 200 mL/hr over 30 Minutes Intravenous Every 8 hours 06/24/21 1116 06/28/21 1849   06/22/21 1100  ceFEPIme (MAXIPIME) 2 g in sodium chloride 0.9 % 100 mL IVPB  Status:  Discontinued        2 g 200 mL/hr over 30 Minutes Intravenous Every 8 hours 06/22/21 0957 06/24/21 1116       Best Practice/Protocols:  VTE Prophylaxis: Lovenox (prophylaxtic dose) Continous Sedation  Consults:     Studies:    Events:  Subjective:    Overnight Issues:   Objective:  Vital signs for last 24 hours: Temp:  [97.9 F (36.6 C)-98.6 F (37 C)] 98.1 F (36.7 C) (10/04 0745) Pulse Rate:  [50-86] 50 (10/04 0700) Resp:  [12-24] 18 (10/04 0700) BP: (89-159)/(57-95) 89/57 (10/04 0700) SpO2:  [94 %-100 %] 96 % (10/04 0700) FiO2 (%):  [40 %] 40 % (10/04 0311) Weight:  [84.3 kg] 84.3 kg (10/04 0500)  Hemodynamic parameters for last 24 hours:    Intake/Output from previous day: 10/03 0701 - 10/04 0700 In: 2478.3 [I.V.:1052.3; NG/GT:1426] Out: 1220 [Urine:1070; Stool:150]  Intake/Output this shift: No intake/output data recorded.  Vent settings for last 24  hours: Vent Mode: PRVC FiO2 (%):  [40 %] 40 % Set Rate:  [18 bmp] 18 bmp Vt Set:  [560 mL] 560 mL PEEP:  [5 cmH20] 5 cmH20 Pressure Support:  [8 cmH20] 8 cmH20 Plateau Pressure:  [18 XBL39-03 cmH20] 21 cmH20  Physical Exam:  General: on vent Neuro: awake on vent, F/C, moves LUE some as well HEENT/Neck: ETT Resp: clear to auscultation bilaterally CVS: RRR GI: soft, nontender, BS WNL, no r/g Extremities: edema 1+  Results for orders placed or performed during the hospital encounter of 06/15/21 (from the past 24 hour(s))  Glucose, capillary     Status: Abnormal   Collection Time: 07/01/21   8:12 AM  Result Value Ref Range   Glucose-Capillary 166 (H) 70 - 99 mg/dL   Comment 1 Notify RN   Glucose, capillary     Status: Abnormal   Collection Time: 07/01/21 11:45 AM  Result Value Ref Range   Glucose-Capillary 105 (H) 70 - 99 mg/dL   Comment 1 Notify RN   Glucose, capillary     Status: Abnormal   Collection Time: 07/01/21  4:14 PM  Result Value Ref Range   Glucose-Capillary 162 (H) 70 - 99 mg/dL  Glucose, capillary     Status: Abnormal   Collection Time: 07/01/21  8:01 PM  Result Value Ref Range   Glucose-Capillary 129 (H) 70 - 99 mg/dL  Glucose, capillary     Status: Abnormal   Collection Time: 07/01/21 11:51 PM  Result Value Ref Range   Glucose-Capillary 164 (H) 70 - 99 mg/dL  Glucose, capillary     Status: Abnormal   Collection Time: 07/02/21  3:41 AM  Result Value Ref Range   Glucose-Capillary 172 (H) 70 - 99 mg/dL  Glucose, capillary     Status: Abnormal   Collection Time: 07/02/21  7:42 AM  Result Value Ref Range   Glucose-Capillary 212 (H) 70 - 99 mg/dL    Assessment & Plan: Present on Admission:  Skull fracture ([place])    LOS: 17 days   Additional comments:I reviewed the patient's new clinical lab test results. . Found down   SAH, IVH, parenchymal contusion - NSGY c/s, Dr. Kathyrn Sheriff, repeat [facility] 9/18 w/ progression of SAH. F/U CT H evolving contusions and decreased SAH, small B hygromas.  Skull fracture - NSGY c/s, Dr. Kathyrn Sheriff Nondisplaced facial fractures - ENT c/s, Dr. Claudia Desanctis, nonop, head of bed elevation to help with swelling, follow up outpatient Alcohol abuse - 186 on admission. CIWA, NH4 WNL VDRF - intubated for airway protection 9/22. Cont guaifenisen for secretions, weaned well all day yesterday. Extubate today Generalized lymphadenopthy - will need outpatient W/U ?CLL  ID - resp cx 9/23 with S pneumo, empiric cefepime 9/24, de-escalate to cefazolin, completed 7d course FEN - NPO, TF VTE - SCDs, Lovenox Dispo - ICU, extubation, labs in  AM Critical Care Total Time*: 37 Minutes  Georganna Skeans, MD, MPH, FACS Trauma & General Surgery Use [email or web address] to contact on call provider  07/02/2021  *Care during the described time interval was provided by me. I have reviewed this patient's available data, including medical history, events of note, physical examination and test results as part of my evaluation.

## 2021-07-02 NOTE — Procedures (Signed)
Extubation Procedure Note  Patient Details:   Name: Randall Harvey. DOB: March 11, 1961 MRN: [id number]   Airway Documentation:    Vent end date: (not recorded) Vent end time: (not recorded)   Evaluation  O2 sats: stable throughout Complications: No apparent complications Patient did tolerate procedure well. Bilateral Breath Sounds: Diminished, Rhonchi   No  Pt extubated per MD order. Extubated to 5L [place]. Positive cuff leak, no stridor heard. RT will continue to monitor.  Tobi Bastos 07/02/2021, 9:02 AM

## 2021-07-03 LAB — GLUCOSE, CAPILLARY
Glucose-Capillary: 133 mg/dL — ABNORMAL HIGH (ref 70–99)
Glucose-Capillary: 142 mg/dL — ABNORMAL HIGH (ref 70–99)
Glucose-Capillary: 150 mg/dL — ABNORMAL HIGH (ref 70–99)
Glucose-Capillary: 156 mg/dL — ABNORMAL HIGH (ref 70–99)
Glucose-Capillary: 167 mg/dL — ABNORMAL HIGH (ref 70–99)
Glucose-Capillary: 189 mg/dL — ABNORMAL HIGH (ref 70–99)

## 2021-07-03 LAB — BASIC METABOLIC PANEL
Anion gap: 8 (ref 5–15)
BUN: 21 mg/dL (ref 8–23)
CO2: 26 mmol/L (ref 22–32)
Calcium: 8.8 mg/dL — ABNORMAL LOW (ref 8.9–10.3)
Chloride: 105 mmol/L (ref 98–111)
Creatinine, Ser: 0.62 mg/dL (ref 0.61–1.24)
GFR, Estimated: >60 mL/min (ref >60)
Glucose, Bld: 151 mg/dL — ABNORMAL HIGH (ref 70–99)
Potassium: 4.1 mmol/L (ref 3.5–5.1)
Sodium: 139 mmol/L (ref 135–145)

## 2021-07-03 LAB — CBC
HCT: 41.1 % (ref 39.0–52.0)
Hemoglobin: 13.5 g/dL (ref 13.0–17.0)
MCH: 32.5 pg (ref 26.0–34.0)
MCHC: 32.8 g/dL (ref 30.0–36.0)
MCV: 98.8 fL (ref 80.0–100.0)
Platelets: 268 10*3/uL (ref 150–400)
RBC: 4.16 MIL/uL — ABNORMAL LOW (ref 4.22–5.81)
RDW: 11.2 % — ABNORMAL LOW (ref 11.5–15.5)
WBC: 7 10*3/uL (ref 4.0–10.5)
nRBC: 0 % (ref 0.0–0.2)

## 2021-07-03 MED ORDER — CLONAZEPAM 1 MG PO TABS
1.0000 mg | ORAL_TABLET | Freq: Three times a day (TID) | ORAL | Status: DC
  Administered 2021-07-03 (×2): 1 mg
  Filled 2021-07-03 (×2): qty 1

## 2021-07-03 MED ORDER — OXYCODONE HCL 5 MG/5ML PO SOLN
5.0000 mg | ORAL | Status: DC | PRN
  Administered 2021-07-07 – 2021-07-08 (×2): 10 mg
  Filled 2021-07-03 (×2): qty 10

## 2021-07-03 MED ORDER — LORAZEPAM 2 MG/ML IJ SOLN
0.5000 mg | INTRAMUSCULAR | Status: DC | PRN
  Administered 2021-07-03 – 2021-07-13 (×31): 1 mg via INTRAVENOUS
  Administered 2021-07-14: 0.5 mg via INTRAVENOUS
  Administered 2021-07-14 – 2021-07-26 (×15): 1 mg via INTRAVENOUS
  Filled 2021-07-03 (×49): qty 1

## 2021-07-03 NOTE — Evaluation (Signed)
Clinical/Bedside Swallow Evaluation Patient Details  Name: Randall Harvey. MRN: [id number] Date of Birth: 05-Feb-1961  Today's Date: 07/03/2021 Time: SLP Start Time (ACUTE ONLY): 0626 SLP Stop Time (ACUTE ONLY): 0956 SLP Time Calculation (min) (ACUTE ONLY): 8 min  Past Medical History:  Past Medical History:  Diagnosis Date   COPD (chronic obstructive pulmonary disease) ([place])    Hypertension    Pulmonary embolus with infarction (HCC)    Renal cell carcinoma (HCC)    Stroke ([place])    Thrombocythemia    Past Surgical History:  Past Surgical History:  Procedure Laterality Date   ANKLE SURGERY     ORBITAL FRACTURE SURGERY Left    HPI:  Randall Harvey is 60 yo male found down and inebriated with blood from L ear. Found to have L SAH and L temporal bone, L sphenoid, L maxillary fxs. PMH: COPD, HTN, PE, renal cell carcinoma, CVA, thrombocytopenia. Intubated 9/22-10/4    Assessment / Plan / Recommendation  Clinical Impression  Randall Harvey was seen for a bedside swallow evaluation. Randall Harvey sedated overnight and weaning off of medication this am. Randall Harvey largely unable to participate in oral mechanism exam this date although SLP noted generalized oral weakness. SLP and Randall Harvey completed oral care and SLP trialed ice chips x2 and thin liquid. To receive bolus, Randall Harvey required 3 verbal/tactile cues to open mouth wide enough. Randall Harvey exhibited anterior oral spillage x1 and pharyngeal phase c/b watery eyes and immediate and delayed cough. Recommend NPO, administering medications via alternative means. SLP will continue to follow for PO readiness.  SLP Visit Diagnosis: Dysphagia, unspecified (R13.10)    Aspiration Risk  Severe aspiration risk    Diet Recommendation NPO   Medication Administration: Via alternative means    Other  Recommendations Oral Care Recommendations: Oral care QID    Recommendations for follow up therapy are one component of a multi-disciplinary discharge planning process, led by the attending physician.   Recommendations may be updated based on patient status, additional functional criteria and insurance authorization.  Follow up Recommendations Inpatient Rehab      Frequency and Duration min 2x/week  2 weeks       Prognosis Prognosis for Safe Diet Advancement: Fair Barriers to Reach Goals: Severity of deficits;Cognitive deficits      Swallow Study   General Date of Onset: 06/15/21 HPI: Randall Harvey is 60 yo male found down and inebriated with blood from L ear. Found to have L SAH and L temporal bone, L sphenoid, L maxillary fxs. PMH: COPD, HTN, PE, renal cell carcinoma, CVA, thrombocytopenia. Intubated 9/22-10/4 Type of Study: Bedside Swallow Evaluation Previous Swallow Assessment: n/a Diet Prior to this Study: NPO Temperature Spikes Noted: No Respiratory Status: Nasal cannula History of Recent Intubation: Yes Length of Intubations (days): 12 days Date extubated: 07/02/21 Behavior/Cognition: Confused;Lethargic/Drowsy;Distractible;Requires cueing;Doesn't follow directions Oral Cavity Assessment: Dried secretions Oral Care Completed by SLP: Yes Oral Cavity - Dentition: Adequate natural dentition Self-Feeding Abilities: Total assist Patient Positioning: Partially reclined Baseline Vocal Quality: Low vocal intensity;Hoarse Volitional Cough: Cognitively unable to elicit Volitional Swallow: Unable to elicit    Oral/Motor/Sensory Function Overall Oral Motor/Sensory Function: Generalized oral weakness Facial ROM: Reduced right;Reduced left Facial Symmetry: Within Functional Limits Facial Strength: Other (Comment) (unable to assess) Facial Sensation: Other (Comment) (unable to assess) Lingual ROM: Other (Comment) (unable to assess) Lingual Symmetry: Other (Comment) (unable to assess) Lingual Strength: Other (Comment) (unable to assess) Lingual Sensation: Other (Comment) (unable to assess) Velum: Other (comment) (unable to assess) Mandible: Other (Comment) (unable to  assess)   Ice Chips  Ice chips: Impaired Presentation: Spoon Oral Phase Impairments: Reduced labial seal;Poor awareness of bolus Oral Phase Functional Implications: Right anterior spillage Pharyngeal Phase Impairments: Cough - Immediate   Thin Liquid Thin Liquid: Impaired Pharyngeal  Phase Impairments: Cough - Immediate    Nectar Thick Nectar Thick Liquid: Not tested   Honey Thick Honey Thick Liquid: Not tested   Puree Puree: Not tested   Solid    Dewitt Rota, SLP-Student  Solid: Not tested      Dewitt Rota 07/03/2021,1:19 PM

## 2021-07-03 NOTE — Evaluation (Signed)
Speech Language Pathology Evaluation Patient Details Name: Marquise Wicke. MRN: [id number] DOB: 04-18-1961 Today's Date: 07/03/2021 Time: 0768-0881 SLP Time Calculation (min) (ACUTE ONLY): 8 min  Problem List:  Patient Active Problem List   Diagnosis Date Noted   Malnutrition of moderate degree 06/20/2021   Skull fracture ([place]) 06/15/2021   Past Medical History:  Past Medical History:  Diagnosis Date   COPD (chronic obstructive pulmonary disease) ([place])    Hypertension    Pulmonary embolus with infarction ([place])    Renal cell carcinoma (HCC)    Stroke ([place])    Thrombocythemia    Past Surgical History:  Past Surgical History:  Procedure Laterality Date   ANKLE SURGERY     ORBITAL FRACTURE SURGERY Left    HPI:  Pt is 60 yo male found down and inebriated with blood from L ear. Found to have L SAH and L temporal bone, L sphenoid, L maxillary fxs. PMH: COPD, HTN, PE, renal cell carcinoma, CVA, thrombocytopenia. Intubated 9/22-10/4   Assessment / Plan / Recommendation Clinical Impression  Pt was seen for a speech language evaluation. Pt sedated overnight and was taken off just prior to session. Pt presents as a [place] level IV and exhibited restless behavior, pulling at lines. Overall, pt presents with impaired orientation, awareness, attention, problem solving, memory, and significantly reduced speech intelligibility SLP cued pt to brush teeth, resulting in correct brushing motion outside of the mouth, unable to move hand to mouth without assist. SLP completed remainder of oral care. Oral mechanism exam difficult to complete but general oral weakness noted; pt unable to follow 1-step simple commands to assess lingual ROM, strength, and sensation. Pt attempting to speak at the word level to answer questions with speech intelligibility ~0-25%. Pt did answer questions gesturally x2 (shrug and nod). Receptive language largely intact for tasks assessed although suspect impaired attention  contributes to ability to respond/execute commands. SLP will f/u for further diagnostic therapy.    SLP Assessment  SLP Recommendation/Assessment: Patient needs continued Speech Lanaguage Pathology Services SLP Visit Diagnosis: Cognitive communication deficit (R41.841);Dysarthria and anarthria (R47.1)    Recommendations for follow up therapy are one component of a multi-disciplinary discharge planning process, led by the attending physician.  Recommendations may be updated based on patient status, additional functional criteria and insurance authorization.    Follow Up Recommendations  Inpatient Rehab (Simultaneous filing. User may not have seen previous data.)    Frequency and Duration min 2x/week  2 weeks      SLP Evaluation Cognition  Overall Cognitive Status: Impaired/Different from baseline Arousal/Alertness: Suspect due to medications Orientation Level: Disoriented X4 Attention: Sustained Sustained Attention: Impaired Memory: Impaired Awareness: Impaired Awareness Impairment: Intellectual impairment;Anticipatory impairment;Emergent impairment Problem Solving: Impaired Problem Solving Impairment: Functional basic Behaviors: Restless Safety/Judgment: Impaired Rancho Los Amigos Scales of Cognitive Functioning: Confused/agitated       Comprehension  Auditory Comprehension Overall Auditory Comprehension:  (will continue to assess) Interfering Components: Attention Visual Recognition/Discrimination Discrimination: Not tested Reading Comprehension Reading Status: Not tested    Expression Expression Primary Mode of Expression: Verbal (gestured z 1) Verbal Expression Overall Verbal Expression: Impaired Initiation: Impaired Repetition:  (NT) Pragmatics: Impairment Impairments: Abnormal affect;Eye contact Interfering Components: Attention Written Expression Written Expression: Not tested   Oral / Motor  Oral Motor/Sensory Function Overall Oral Motor/Sensory Function:  Generalized oral weakness Facial ROM: Reduced right;Reduced left Facial Symmetry: Within Functional Limits Facial Strength: Other (Comment) (unable to assess) Facial Sensation: Other (Comment) (unable to assess) Lingual ROM: Other (Comment) (  unable to assess) Lingual Symmetry: Other (Comment) (unable to assess) Lingual Strength: Other (Comment) (unable to assess) Lingual Sensation: Other (Comment) (unable to assess) Velum: Other (comment) (unable to assess) Mandible: Other (Comment) (unable to assess) Motor Speech Overall Motor Speech: Impaired Phonation: Low vocal intensity;Hoarse Articulation: Impaired Level of Impairment: Word Intelligibility: Intelligibility reduced Word: 0-24% accurate Motor Planning: Not tested   GO          Dewitt Rota, SLP-Student           Dewitt Rota 07/03/2021, 1:07 PM

## 2021-07-03 NOTE — Progress Notes (Signed)
Trauma/Critical Care Follow Up Note  Subjective:    Overnight Issues:   Objective:  Vital signs for last 24 hours: Temp:  [97.7 F (36.5 C)-98.3 F (36.8 C)] 98.2 F (36.8 C) (10/05 1132) Pulse Rate:  [50-77] 70 (10/05 0700) Resp:  [13-22] 22 (10/05 0700) BP: (92-155)/(56-105) 129/74 (10/05 0700) SpO2:  [92 %-99 %] 97 % (10/05 0700) Weight:  [79.3 kg] 79.3 kg (10/05 0500)  Hemodynamic parameters for last 24 hours:    Intake/Output from previous day: 10/04 0701 - 10/05 0700 In: 1972.9 [I.V.:479.9; NG/GT:1493] Out: 2650 [Urine:2350; Stool:300]  Intake/Output this shift: No intake/output data recorded.  Vent settings for last 24 hours:    Physical Exam:  Gen: comfortable, no distress Neuro: not f/c or interacting HEENT: PERRL Neck: supple CV: RRR Pulm: unlabored breathing Abd: soft, NT GU: clear yellow urine Extr: wwp, no edema   Results for orders placed or performed during the hospital encounter of 06/15/21 (from the past 24 hour(s))  Glucose, capillary     Status: Abnormal   Collection Time: 07/02/21  3:37 PM  Result Value Ref Range   Glucose-Capillary 199 (H) 70 - 99 mg/dL  Glucose, capillary     Status: Abnormal   Collection Time: 07/02/21  8:07 PM  Result Value Ref Range   Glucose-Capillary 197 (H) 70 - 99 mg/dL  Glucose, capillary     Status: Abnormal   Collection Time: 07/02/21 11:56 PM  Result Value Ref Range   Glucose-Capillary 163 (H) 70 - 99 mg/dL  Glucose, capillary     Status: Abnormal   Collection Time: 07/03/21  3:46 AM  Result Value Ref Range   Glucose-Capillary 167 (H) 70 - 99 mg/dL  CBC     Status: Abnormal   Collection Time: 07/03/21  6:02 AM  Result Value Ref Range   WBC 7.0 4.0 - 10.5 K/uL   RBC 4.16 (L) 4.22 - 5.81 MIL/uL   Hemoglobin 13.5 13.0 - 17.0 g/dL   HCT 41.1 39.0 - 52.0 %   MCV 98.8 80.0 - 100.0 fL   MCH 32.5 26.0 - 34.0 pg   MCHC 32.8 30.0 - 36.0 g/dL   RDW 11.2 (L) 11.5 - 15.5 %   Platelets 268 150 - 400 K/uL    nRBC 0.0 0.0 - 0.2 %  Basic metabolic panel     Status: Abnormal   Collection Time: 07/03/21  6:02 AM  Result Value Ref Range   Sodium 139 135 - 145 mmol/L   Potassium 4.1 3.5 - 5.1 mmol/L   Chloride 105 98 - 111 mmol/L   CO2 26 22 - 32 mmol/L   Glucose, Bld 151 (H) 70 - 99 mg/dL   BUN 21 8 - 23 mg/dL   Creatinine, Ser 0.62 0.61 - 1.24 mg/dL   Calcium 8.8 (L) 8.9 - 10.3 mg/dL   GFR, Estimated >60 >60 mL/min   Anion gap 8 5 - 15  Glucose, capillary     Status: Abnormal   Collection Time: 07/03/21  7:45 AM  Result Value Ref Range   Glucose-Capillary 156 (H) 70 - 99 mg/dL  Glucose, capillary     Status: Abnormal   Collection Time: 07/03/21 11:29 AM  Result Value Ref Range   Glucose-Capillary 133 (H) 70 - 99 mg/dL    Assessment & Plan: The plan of care was discussed with the bedside nurse for the day, Marlowe Kays, who is in agreement with this plan and no additional concerns were raised.   Present on  Admission:  Skull fracture ([place])    LOS: 18 days   Additional comments:I reviewed the patient's new clinical lab test results.   and I reviewed the patients new imaging test results.    Found down   SAH, IVH, parenchymal contusion - NSGY c/s, Dr. Kathyrn Sheriff, repeat [facility] 9/18 w/ progression of SAH. F/U CT H evolving contusions and decreased SAH, small B hygromas.  Skull fracture - NSGY c/s, Dr. Kathyrn Sheriff Nondisplaced facial fractures - ENT c/s, Dr. Claudia Desanctis, nonop, head of bed elevation to help with swelling, follow up outpatient Alcohol abuse - 186 on admission. CIWA, NH4 WNL VDRF - intubated for airway protection 9/22. Cont guaifenisen for secretions, extubated 10/4. Neuro status not great, but I do not think he needs to be re-intubated at this point.  Generalized lymphadenopthy - will need outpatient W/U ?CLL  ID - resp cx 9/23 with S pneumo, empiric cefepime 9/24, de-escalated to cefazolin, completed 7d course FEN - NPO, TF VTE - SCDs, Lovenox Dispo - ICU  Jesusita Oka, MD Trauma  & General Surgery Please use [email or web address] to contact on call provider  07/03/2021  *Care during the described time interval was provided by me. I have reviewed this patient's available data, including medical history, events of note, physical examination and test results as part of my evaluation.

## 2021-07-04 LAB — BASIC METABOLIC PANEL
Anion gap: 11 (ref 5–15)
BUN: 16 mg/dL (ref 8–23)
CO2: 24 mmol/L (ref 22–32)
Calcium: 9.1 mg/dL (ref 8.9–10.3)
Chloride: 106 mmol/L (ref 98–111)
Creatinine, Ser: 0.7 mg/dL (ref 0.61–1.24)
GFR, Estimated: >60 mL/min (ref >60)
Glucose, Bld: 173 mg/dL — ABNORMAL HIGH (ref 70–99)
Potassium: 3.6 mmol/L (ref 3.5–5.1)
Sodium: 141 mmol/L (ref 135–145)

## 2021-07-04 LAB — CBC
HCT: 40.6 % (ref 39.0–52.0)
Hemoglobin: 13.5 g/dL (ref 13.0–17.0)
MCH: 32.5 pg (ref 26.0–34.0)
MCHC: 33.3 g/dL (ref 30.0–36.0)
MCV: 97.6 fL (ref 80.0–100.0)
Platelets: 312 10*3/uL (ref 150–400)
RBC: 4.16 MIL/uL — ABNORMAL LOW (ref 4.22–5.81)
RDW: 11.3 % — ABNORMAL LOW (ref 11.5–15.5)
WBC: 10.5 10*3/uL (ref 4.0–10.5)
nRBC: 0 % (ref 0.0–0.2)

## 2021-07-04 LAB — GLUCOSE, CAPILLARY
Glucose-Capillary: 125 mg/dL — ABNORMAL HIGH (ref 70–99)
Glucose-Capillary: 164 mg/dL — ABNORMAL HIGH (ref 70–99)
Glucose-Capillary: 172 mg/dL — ABNORMAL HIGH (ref 70–99)
Glucose-Capillary: 173 mg/dL — ABNORMAL HIGH (ref 70–99)
Glucose-Capillary: 174 mg/dL — ABNORMAL HIGH (ref 70–99)
Glucose-Capillary: 192 mg/dL — ABNORMAL HIGH (ref 70–99)

## 2021-07-04 MED ORDER — QUETIAPINE FUMARATE 100 MG PO TABS
100.0000 mg | ORAL_TABLET | Freq: Two times a day (BID) | ORAL | Status: DC
  Administered 2021-07-04 – 2021-07-05 (×3): 100 mg
  Filled 2021-07-04 (×3): qty 1

## 2021-07-04 MED ORDER — CLONAZEPAM 1 MG PO TABS
1.0000 mg | ORAL_TABLET | Freq: Two times a day (BID) | ORAL | Status: DC
  Administered 2021-07-04 – 2021-07-05 (×3): 1 mg
  Filled 2021-07-04 (×3): qty 1

## 2021-07-04 NOTE — Progress Notes (Signed)
Speech Language Pathology Treatment: Dysphagia;Cognitive-Linquistic  Patient Details Name: Randall Harvey. MRN: [id number] DOB: 24-Nov-1960 Today's Date: 07/04/2021 Time: 9924-2683 SLP Time Calculation (min) (ACUTE ONLY): 20 min  Assessment / Plan / Recommendation Clinical Impression  Pt still lethargic/groggy from Ativan early this morning. Significant internal distractions and difficulty sustaining attention to therapist or tasks. SLP utilized verbal and visual cues to engage in teeth brushing and responses to basic questions re: orientation, biographical information. He denied being in the hospital after multiple repetition. Phonatory production inhibited by audible pharyngeal secretions. Reflexive coughs could not clear mucous and he had difficulty volitionally coughing or throat clearing. Hiccups were intermittently present. Continue ST goals working toward cognitive independence and increasing cough response, vocalizations for swallow. ST may initiate RMT (respiratory muscle strength training).    HPI HPI: Pt is 60 yo male found down and inebriated with blood from L ear. Found to have L SAH and L temporal bone, L sphenoid, L maxillary fxs. PMH: COPD, HTN, PE, renal cell carcinoma, CVA, thrombocytopenia. Intubated 9/22-10/4      SLP Plan  Continue with current plan of care      Recommendations for follow up therapy are one component of a multi-disciplinary discharge planning process, led by the attending physician.  Recommendations may be updated based on patient status, additional functional criteria and insurance authorization.    Recommendations  Diet recommendations: NPO Medication Administration: Via alternative means                Oral Care Recommendations: Oral care QID Follow up Recommendations:  (depends on progress) SLP Visit Diagnosis: Dysphagia, unspecified (R13.10);Cognitive communication deficit (M19.622) Plan: Continue with current plan of care       GO                 [name]  07/04/2021, 3:33 PM

## 2021-07-04 NOTE — Progress Notes (Signed)
Nutrition Follow-up  DOCUMENTATION CODES:   Non-severe (moderate) malnutrition in context of chronic illness  INTERVENTION:   -Continue Pivot 1.5 @ 60 ml/hr via cortrak tube (1440 ml/ day)  Tube feeding regimen 2160 kcal, 135 grams of protein, and 1092 ml of H2O.   NUTRITION DIAGNOSIS:   Moderate Malnutrition related to chronic illness (COPD) as evidenced by moderate fat depletion, moderate muscle depletion.  Ongoing  GOAL:   Patient will meet greater than or equal to 90% of their needs  Met with TF  MONITOR:   Diet advancement, Labs, Weight trends, TF tolerance, I & O's  REASON FOR ASSESSMENT:   Consult Enteral/tube feeding initiation and management, Assessment of nutrition requirement/status  ASSESSMENT:   60 year old male who presented to the ED on 9/17 after being found down with blood draining from L ear after unknown trauma. Pt was intoxicated. PMH of renal cell carcinoma, HTN, stroke, COPD. Pt found to have [place], IVH, parenchymal contusion, skull fx, nondisplaced facial fx.  9/21 cortrak; tip gastric 9/22 intubated for airway protection  10/4- extubated 10/5- s/p BSE- recommend NPO  Reviewed I/O's: -959 ml x 24 hours and +12.6 L since 06/20/21  UOP: 2.3 L x 24 hours  Rectal tube output: 80 ml x 24 hours  Pt sleeping soundly at time of visit. No family present at time of visit.   Case discussed with RN, who reports pt is tolerating TF well.   Noted Pivot 1.5 infusing via cortrak tube at 60 ml/hr.   Medications reviewed and include folic acid and thiamine.   Labs reviewed: CBGS: 125-164 (inpatient orders for glycemic control are 0-20 units inuslin aspart every 4 hours).    Diet Order:   Diet Order             Diet NPO time specified  Diet effective now                   EDUCATION NEEDS:   Not appropriate for education at this time  Skin:  Skin Assessment: Skin Integrity Issues: Skin Integrity Issues:: Other (Comment) Other: skin tears to  mid rt back and lt lower buttocks  Last BM:  106/22 (80 ml via rectal tube)  Height:   Ht Readings from Last 1 Encounters:  06/15/21 5' 9"  (1.753 m)    Weight:   Wt Readings from Last 1 Encounters:  07/04/21 75.9 kg   BMI:  Body mass index is 24.71 kg/m.  Estimated Nutritional Needs:   Kcal:  2481-8590  Protein:  115-130 grams  Fluid:  > 2 L    Loistine Chance, RD, LDN, [place] Registered Dietitian II Certified Diabetes Care and Education Specialist Please refer to [facility] for RD and/or RD on-call/weekend/after hours pager

## 2021-07-04 NOTE — Progress Notes (Signed)
Patient ID: Randall Harvey., male   DOB: [birth year], 60 y.o.   MRN: [id number] I called his son, Randall Harvey. Got VM and mailbox is full. Georganna Skeans, MD, MPH, FACS Please use [email or web address] to contact on call provider

## 2021-07-04 NOTE — Progress Notes (Signed)
Patient ID: Randall Girt., male   DOB: 1961/02/18, 60 y.o.   MRN: [id number]     Subjective: Not talking ROS negative except as listed above. Objective: Vital signs in last 24 hours: Temp:  [98.2 F (36.8 C)-99.4 F (37.4 C)] 99 F (37.2 C) (10/06 0800) Pulse Rate:  [93-121] 94 (10/06 0800) Resp:  [12-26] 21 (10/06 0800) BP: (112-168)/(75-98) 160/83 (10/06 0800) SpO2:  [93 %-98 %] 95 % (10/06 0800) Weight:  [75.9 kg] 75.9 kg (10/06 0500) Last BM Date: 07/04/21  Intake/Output from previous day: 10/05 0701 - 10/06 0700 In: 1396.1 [I.V.:34.1; NG/GT:1362] Out: 2355 [Urine:2275; Stool:80] Intake/Output this shift: Total I/O In: 180 [NG/GT:180] Out: -   General appearance: cooperative Resp: clear to auscultation bilaterally Cardio: regular rate and rhythm GI: soft, non-tender; bowel sounds normal; no masses,  no organomegaly Extremities: calves soft Neurologic: Mental status: does F/C, not verbalizing  Lab Results: CBC  Recent Labs    07/03/21 0602 07/04/21 0303  WBC 7.0 10.5  HGB 13.5 13.5  HCT 41.1 40.6  PLT 268 312   BMET Recent Labs    07/03/21 0602 07/04/21 0303  NA 139 141  K 4.1 3.6  CL 105 106  CO2 26 24  GLUCOSE 151* 173*  BUN 21 16  CREATININE 0.62 0.70  CALCIUM 8.8* 9.1   PT/INR No results for input(s): LABPROT, INR in the last 72 hours. ABG No results for input(s): PHART, HCO3 in the last 72 hours.  Invalid input(s): PCO2, PO2  Studies/Results: No results found.  Anti-infectives: Anti-infectives (From admission, onward)    Start     Dose/Rate Route Frequency Ordered Stop   06/24/21 1800  ceFAZolin (ANCEF) IVPB 2g/100 mL premix        2 g 200 mL/hr over 30 Minutes Intravenous Every 8 hours 06/24/21 1116 06/28/21 1849   06/22/21 1100  ceFEPIme (MAXIPIME) 2 g in sodium chloride 0.9 % 100 mL IVPB  Status:  Discontinued        2 g 200 mL/hr over 30 Minutes Intravenous Every 8 hours 06/22/21 0957 06/24/21 1116        Assessment/Plan: Found down   SAH, IVH, parenchymal contusion - NSGY c/s, Dr. Kathyrn Sheriff, repeat [facility] 9/18 w/ progression of SAH. F/U CT H evolving contusions and decreased SAH, small B hygromas. Continue TBI team therapies..  Skull fracture - NSGY c/s, Dr. Kathyrn Sheriff Nondisplaced facial fractures - ENT c/s, Dr. Claudia Desanctis, nonop, head of bed elevation to help with swelling, follow up outpatient Alcohol abuse - 186 on admission. CIWA, NH4 WNL VDRF - tolerated extubation 10/4 Generalized lymphadenopthy - will need outpatient W/U ?CLL  ID - resp cx 9/23 with S pneumo, empiric cefepime 9/24, de-escalate to cefazolin, completed 7d course FEN - NPO, TF VTE - SCDs, Lovenox Dispo - to 4NP, TBI team therapies, anticipate CIR   LOS: 19 days    Georganna Skeans, MD, MPH, FACS Trauma & General Surgery Use [email or web address] to contact on call provider  07/04/2021

## 2021-07-05 LAB — BASIC METABOLIC PANEL
Anion gap: 8 (ref 5–15)
BUN: 19 mg/dL (ref 8–23)
CO2: 29 mmol/L (ref 22–32)
Calcium: 9.3 mg/dL (ref 8.9–10.3)
Chloride: 103 mmol/L (ref 98–111)
Creatinine, Ser: 0.77 mg/dL (ref 0.61–1.24)
GFR, Estimated: >60 mL/min (ref >60)
Glucose, Bld: 152 mg/dL — ABNORMAL HIGH (ref 70–99)
Potassium: 4.2 mmol/L (ref 3.5–5.1)
Sodium: 140 mmol/L (ref 135–145)

## 2021-07-05 LAB — URINALYSIS, ROUTINE W REFLEX MICROSCOPIC
Bilirubin Urine: NEGATIVE
Glucose, UA: NEGATIVE mg/dL
Hgb urine dipstick: NEGATIVE
Ketones, ur: NEGATIVE mg/dL
Nitrite: POSITIVE — AB
Protein, ur: NEGATIVE mg/dL
Specific Gravity, Urine: 1.021 (ref 1.005–1.030)
pH: 5 (ref 5.0–8.0)

## 2021-07-05 LAB — GLUCOSE, CAPILLARY
Glucose-Capillary: 137 mg/dL — ABNORMAL HIGH (ref 70–99)
Glucose-Capillary: 140 mg/dL — ABNORMAL HIGH (ref 70–99)
Glucose-Capillary: 156 mg/dL — ABNORMAL HIGH (ref 70–99)
Glucose-Capillary: 159 mg/dL — ABNORMAL HIGH (ref 70–99)
Glucose-Capillary: 174 mg/dL — ABNORMAL HIGH (ref 70–99)
Glucose-Capillary: 176 mg/dL — ABNORMAL HIGH (ref 70–99)

## 2021-07-05 LAB — CBC
HCT: 41.1 % (ref 39.0–52.0)
Hemoglobin: 13 g/dL (ref 13.0–17.0)
MCH: 31.8 pg (ref 26.0–34.0)
MCHC: 31.6 g/dL (ref 30.0–36.0)
MCV: 100.5 fL — ABNORMAL HIGH (ref 80.0–100.0)
Platelets: 284 10*3/uL (ref 150–400)
RBC: 4.09 MIL/uL — ABNORMAL LOW (ref 4.22–5.81)
RDW: 11.3 % — ABNORMAL LOW (ref 11.5–15.5)
WBC: 13.1 10*3/uL — ABNORMAL HIGH (ref 4.0–10.5)
nRBC: 0 % (ref 0.0–0.2)

## 2021-07-05 MED ORDER — SODIUM CHLORIDE 0.9 % IV SOLN
INTRAVENOUS | Status: DC | PRN
Start: 2021-07-05 — End: 2021-07-31

## 2021-07-05 MED ORDER — CEFEPIME HCL 2 G IJ SOLR
2.0000 g | Freq: Three times a day (TID) | INTRAMUSCULAR | Status: AC
  Administered 2021-07-05 – 2021-07-11 (×19): 2 g via INTRAVENOUS
  Filled 2021-07-05 (×21): qty 2

## 2021-07-05 MED ORDER — QUETIAPINE FUMARATE 50 MG PO TABS
50.0000 mg | ORAL_TABLET | Freq: Two times a day (BID) | ORAL | Status: DC
  Administered 2021-07-05 – 2021-07-08 (×6): 50 mg
  Filled 2021-07-05: qty 2
  Filled 2021-07-05 (×4): qty 1
  Filled 2021-07-05: qty 2
  Filled 2021-07-05 (×2): qty 1

## 2021-07-05 MED ORDER — CLONAZEPAM 0.5 MG PO TABS
0.5000 mg | ORAL_TABLET | Freq: Two times a day (BID) | ORAL | Status: DC
  Administered 2021-07-05 – 2021-07-08 (×6): 0.5 mg
  Filled 2021-07-05 (×8): qty 1

## 2021-07-05 MED ORDER — SODIUM CHLORIDE 0.9 % IV SOLN: 2.0000 g | Freq: Two times a day (BID) | INTRAVENOUS | Status: DC

## 2021-07-05 NOTE — Progress Notes (Signed)
Patient ID: Randall Girt., male   DOB: 04-16-1961, 60 y.o.   MRN: [id number]     Subjective: Got some ativan overnight. Sleepy after am meds. ROS negative except as listed above. Objective: Vital signs in last 24 hours: Temp:  [98.4 F (36.9 C)-101.9 F (38.8 C)] 98.9 F (37.2 C) (10/07 0800) Pulse Rate:  [77-110] 93 (10/07 1000) Resp:  [18-28] 22 (10/07 1000) BP: (124-167)/(66-91) 124/70 (10/07 1000) SpO2:  [89 %-97 %] 93 % (10/07 1000) Last BM Date: 07/05/21  Intake/Output from previous day: 10/06 0701 - 10/07 0700 In: 1380 [NG/GT:1380] Out: 975 [Urine:875; Stool:100] Intake/Output this shift: Total I/O In: 360 [NG/GT:360] Out: 300 [Urine:300]  General appearance: cooperative Resp: clear to auscultation bilaterally Cardio: regular rate and rhythm GI: soft, NT Extremities: calves soft Neuro: sleepy after meds, prev said name to RN, does F/C  Lab Results: CBC  Recent Labs    07/04/21 0303 07/05/21 0251  WBC 10.5 13.1*  HGB 13.5 13.0  HCT 40.6 41.1  PLT 312 284   BMET Recent Labs    07/04/21 0303 07/05/21 0251  NA 141 140  K 3.6 4.2  CL 106 103  CO2 24 29  GLUCOSE 173* 152*  BUN 16 19  CREATININE 0.70 0.77  CALCIUM 9.1 9.3   PT/INR No results for input(s): LABPROT, INR in the last 72 hours. ABG No results for input(s): PHART, HCO3 in the last 72 hours.  Invalid input(s): PCO2, PO2  Studies/Results: No results found.  Anti-infectives: Anti-infectives (From admission, onward)    Start     Dose/Rate Route Frequency Ordered Stop   06/24/21 1800  ceFAZolin (ANCEF) IVPB 2g/100 mL premix        2 g 200 mL/hr over 30 Minutes Intravenous Every 8 hours 06/24/21 1116 06/28/21 1849   06/22/21 1100  ceFEPIme (MAXIPIME) 2 g in sodium chloride 0.9 % 100 mL IVPB  Status:  Discontinued        2 g 200 mL/hr over 30 Minutes Intravenous Every 8 hours 06/22/21 0957 06/24/21 1116       Assessment/Plan: Found down   SAH, IVH, parenchymal contusion - NSGY  c/s, Dr. Kathyrn Sheriff, repeat [facility] 9/18 w/ progression of SAH. F/U CT H evolving contusions and decreased SAH, small B hygromas. Continue TBI team therapies..  Skull fracture - NSGY c/s, Dr. Kathyrn Sheriff Nondisplaced facial fractures - ENT c/s, Dr. Claudia Desanctis, nonop, head of bed elevation to help with swelling, follow up outpatient Alcohol abuse - 186 on admission. CIWA, NH4 WNL VDRF - tolerated extubation 10/4 Generalized lymphadenopthy - will need outpatient W/U ?CLL  ID - resp cx 9/23 with S pneumo, empiric cefepime 9/24, de-escalate to cefazolin, completed 7d course. WBC up to 13.1 and TM 101.9 last night. Will give Maxipime empiric and check resp CX and U/A. FEN - NPO, TF VTE - SCDs, Lovenox Dispo - to 4NP, TBI team therapies, may need SNF   LOS: 20 days    Randall Skeans, MD, MPH, FACS Trauma & General Surgery Use [email or web address] to contact on call provider  07/05/2021

## 2021-07-05 NOTE — Progress Notes (Signed)
Received order for NTS/sputum culture.  I re-confirmed w/ Trauma MD prior to NTS d/t hx of facial traumas.  Per MD, okay to NTS.    Pt appeared to tol NTS well, noted small/scant amount of pink tinged/blood in sputum.  Sat stable t/o.

## 2021-07-05 NOTE — Progress Notes (Signed)
SLP Cancellation Note  Patient Details Name: Randall Harvey. MRN: [id number] DOB: Sep 10, 1961   Cancelled treatment:        checked on pt and reviewed chart. He was unable to participate due to lethargy after Ativan last night or early this morning. Plan to continue efforts first next week   [name] 07/05/2021, 3:26 PM

## 2021-07-06 LAB — CBC
HCT: 40.8 % (ref 39.0–52.0)
Hemoglobin: 13.1 g/dL (ref 13.0–17.0)
MCH: 31.6 pg (ref 26.0–34.0)
MCHC: 32.1 g/dL (ref 30.0–36.0)
MCV: 98.6 fL (ref 80.0–100.0)
Platelets: 286 10*3/uL (ref 150–400)
RBC: 4.14 MIL/uL — ABNORMAL LOW (ref 4.22–5.81)
RDW: 11.3 % — ABNORMAL LOW (ref 11.5–15.5)
WBC: 12.5 10*3/uL — ABNORMAL HIGH (ref 4.0–10.5)
nRBC: 0 % (ref 0.0–0.2)

## 2021-07-06 LAB — GLUCOSE, CAPILLARY
Glucose-Capillary: 130 mg/dL — ABNORMAL HIGH (ref 70–99)
Glucose-Capillary: 186 mg/dL — ABNORMAL HIGH (ref 70–99)
Glucose-Capillary: 188 mg/dL — ABNORMAL HIGH (ref 70–99)
Glucose-Capillary: 135 mg/dL — ABNORMAL HIGH (ref 70–99)
Glucose-Capillary: 120 mg/dL — ABNORMAL HIGH (ref 70–99)
Glucose-Capillary: 207 mg/dL — ABNORMAL HIGH (ref 70–99)

## 2021-07-06 MED ORDER — ACETAMINOPHEN 500 MG PO TABS
1000.0000 mg | ORAL_TABLET | Freq: Four times a day (QID) | ORAL | Status: DC
  Administered 2021-07-06 – 2021-07-11 (×9): 1000 mg
  Filled 2021-07-06 (×10): qty 2

## 2021-07-06 NOTE — Progress Notes (Signed)
Patient ID: Randall Girt., male   DOB: 04-03-1961, 60 y.o.   MRN: [id number]     Subjective:  Sound asleep after am meds. ROS negative except as listed above. Objective: Vital signs in last 24 hours: Temp:  [97.7 F (36.5 C)-99.9 F (37.7 C)] 98.5 F (36.9 C) (10/08 0800) Pulse Rate:  [79-103] 92 (10/08 0800) Resp:  [17-29] 19 (10/08 0800) BP: (122-167)/(68-115) 153/84 (10/08 0800) SpO2:  [82 %-100 %] 91 % (10/08 0800) Weight:  [76 kg] 76 kg (10/08 0500) Last BM Date: 07/05/21  Intake/Output from previous day: 10/07 0701 - 10/08 0700 In: 1774.9 [I.V.:14.8; NG/GT:1560; IV Piggyback:200.1] Out: 2250 [Urine:2050; Stool:200] Intake/Output this shift: Total I/O In: 161.6 [I.V.:1.6; NG/GT:60; IV Piggyback:100] Out: -   General appearance: cooperative Resp: clear to auscultation bilaterally Cardio: regular rate and rhythm GI: soft, NT Extremities: calves soft Neuro: sleepy after meds, prev said name to RN, does F/C; soft restraints  Lab Results: CBC  Recent Labs    07/05/21 0251 07/06/21 0210  WBC 13.1* 12.5*  HGB 13.0 13.1  HCT 41.1 40.8  PLT 284 286    BMET Recent Labs    07/04/21 0303 07/05/21 0251  NA 141 140  K 3.6 4.2  CL 106 103  CO2 24 29  GLUCOSE 173* 152*  BUN 16 19  CREATININE 0.70 0.77  CALCIUM 9.1 9.3    PT/INR No results for input(s): LABPROT, INR in the last 72 hours. ABG No results for input(s): PHART, HCO3 in the last 72 hours.  Invalid input(s): PCO2, PO2  Studies/Results: No results found.  Anti-infectives: Anti-infectives (From admission, onward)    Start     Dose/Rate Route Frequency Ordered Stop   07/05/21 1200  ceFEPIme (MAXIPIME) 2 g in sodium chloride 0.9 % 100 mL IVPB        2 g 200 mL/hr over 30 Minutes Intravenous Every 8 hours 07/05/21 1052     07/05/21 1130  ceFEPIme (MAXIPIME) 2 g in sodium chloride 0.9 % 100 mL IVPB  Status:  Discontinued        2 g 200 mL/hr over 30 Minutes Intravenous Every 12 hours 07/05/21  1042 07/05/21 1052   06/24/21 1800  ceFAZolin (ANCEF) IVPB 2g/100 mL premix        2 g 200 mL/hr over 30 Minutes Intravenous Every 8 hours 06/24/21 1116 06/28/21 1849   06/22/21 1100  ceFEPIme (MAXIPIME) 2 g in sodium chloride 0.9 % 100 mL IVPB  Status:  Discontinued        2 g 200 mL/hr over 30 Minutes Intravenous Every 8 hours 06/22/21 0957 06/24/21 1116       Assessment/Plan: Found down   SAH, IVH, parenchymal contusion - NSGY c/s, Dr. Kathyrn Sheriff, repeat [facility] 9/18 w/ progression of SAH. F/U CT H evolving contusions and decreased SAH, small B hygromas. Continue TBI team therapies..  Skull fracture - NSGY c/s, Dr. Kathyrn Sheriff Nondisplaced facial fractures - ENT c/s, Dr. Claudia Desanctis, nonop, head of bed elevation to help with swelling, follow up outpatient Alcohol abuse - 186 on admission. CIWA, NH4 WNL VDRF - tolerated extubation 10/4 Generalized lymphadenopthy - will need outpatient W/U ?CLL  ID - resp cx 9/23 with S pneumo, empiric cefepime 9/24, de-escalate to cefazolin, completed 7d course. WBC up to 13.1 and TM 101.9 -2 night ago. Will give Maxipime empiric and check resp CX - few GPC/GNR and U/A - +nitrite, mod leuk, many bacteria, 21-50 wbc FEN - NPO, TF VTE - SCDs,  Lovenox Dispo - to 4NP, TBI team therapies, may need SNF; will see if lab can run urine culture on the UA   LOS: 21 days    Leighton Ruff. Redmond Pulling, MD, FACS General, Bariatric, & Minimally Invasive Surgery [facility], [place]   07/06/2021

## 2021-07-06 NOTE — NC FL2 (Signed)
[place] LEVEL OF CARE SCREENING TOOL     IDENTIFICATION  Patient Name: Randall Harvey. Birthdate: 1961/08/05 Sex: male Admission Date (Current Location): 06/15/2021  [facility] and [place] Number:  Herbalist and Address:  [facility], [place] [address], [place], [place] [address]      Provider Number: [id number]  Attending Physician Name and Address:  Md, Trauma, MD  Relative Name and Phone Number:       Current Level of Care: Hospital Recommended Level of Care: [place] Prior Approval Number:    Date Approved/Denied:   PASRR Number: Pending  Discharge Plan: SNF    Current Diagnoses: Patient Active Problem List   Diagnosis Date Noted   Malnutrition of moderate degree 06/20/2021   Skull fracture ([place]) 06/15/2021    Orientation RESPIRATION BLADDER Height & Weight        O2 (nasaul cannula 3 l/m) Continent Weight: 167 lb 8.8 oz (76 kg) Height:  5\' 9"  (175.3 cm)  BEHAVIORAL SYMPTOMS/MOOD NEUROLOGICAL BOWEL NUTRITION STATUS      Continent Diet (See med record)  AMBULATORY STATUS COMMUNICATION OF NEEDS Skin   Limited Assist Verbally Normal                       Personal Care Assistance Level of Assistance  Bathing, Feeding, Dressing Bathing Assistance: Limited assistance Feeding assistance: Limited assistance Dressing Assistance: Limited assistance     Functional Limitations Info             [place]  PT (By licensed PT), OT (By licensed OT)     PT Frequency: 5x weekly OT Frequency: 5x weekly            Contractures Contractures Info: Not present    Additional Factors Info  Code Status, Allergies Code Status Info: Full Allergies Info: NKDA           Current Medications (07/06/2021):  This is the current hospital active medication list Current Facility-Administered Medications  Medication Dose Route Frequency Provider Last Rate Last Admin   0.9 %  sodium  chloride infusion   Intravenous PRN Georganna Skeans, MD   Stopped at 07/06/21 0601   acetaminophen (TYLENOL) 160 MG/5ML solution 1,000 mg  1,000 mg Per Tube Q6H Jesusita Oka, MD   1,000 mg at 07/06/21 0559   bethanechol (URECHOLINE) tablet 25 mg  25 mg Per Tube TID Georganna Skeans, MD   25 mg at 07/06/21 0913   ceFEPIme (MAXIPIME) 2 g in sodium chloride 0.9 % 100 mL IVPB  2 g Intravenous Q8H Georganna Skeans, MD   Stopped at 07/06/21 0551   Chlorhexidine Gluconate Cloth 2 % PADS 6 each  6 each Topical Daily Jesusita Oka, MD   6 each at 07/05/21 1406   clonazePAM (KLONOPIN) tablet 0.5 mg  0.5 mg Per Tube BID Georganna Skeans, MD   0.5 mg at 07/06/21 0913   enoxaparin (LOVENOX) injection 30 mg  30 mg Subcutaneous Q12H Jesusita Oka, MD   30 mg at 07/06/21 0914   feeding supplement (PIVOT 1.5 CAL) liquid 1,000 mL  1,000 mL Per Tube Continuous Georganna Skeans, MD 60 mL/hr at 07/05/21 2255 1,000 mL at 84/53/64 6803   folic acid (FOLVITE) tablet 1 mg  1 mg Per Tube Daily Jesusita Oka, MD   1 mg at 07/06/21 0913   guaiFENesin (ROBITUSSIN) 100 MG/5ML solution 400 mg  20 mL  Per Tube Q4H Georganna Skeans, MD   400 mg at 07/06/21 0913   insulin aspart (novoLOG) injection 0-20 Units  0-20 Units Subcutaneous Q4H Georganna Skeans, MD   4 Units at 07/06/21 0900   LORazepam (ATIVAN) injection 0.5-1 mg  0.5-1 mg Intravenous Q4H PRN Jesusita Oka, MD   1 mg at 07/06/21 0758   MEDLINE mouth rinse  15 mL Mouth Rinse BID Georganna Skeans, MD   15 mL at 07/06/21 0930   methocarbamol (ROBAXIN) tablet 1,000 mg  1,000 mg Per Tube Josefina Do, MD   1,000 mg at 07/06/21 0600   multivitamin with minerals tablet 1 tablet  1 tablet Per Tube Daily Jesusita Oka, MD   1 tablet at 07/06/21 0913   ondansetron (ZOFRAN) injection 4 mg  4 mg Intravenous Q6H PRN Stechschulte, Nickola Major, MD   4 mg at 06/16/21 0359   oxyCODONE (Oxy IR/ROXICODONE) immediate release tablet 10 mg  10 mg Per Tube Q6H Georganna Skeans, MD   10  mg at 07/06/21 0600   oxyCODONE (ROXICODONE) 5 MG/5ML solution 5-10 mg  5-10 mg Per Tube Q4H PRN Jesusita Oka, MD       QUEtiapine (SEROQUEL) tablet 50 mg  50 mg Per Tube BID Georganna Skeans, MD   50 mg at 07/06/21 0913   thiamine tablet 100 mg  100 mg Per Tube Daily Jesusita Oka, MD   100 mg at 07/06/21 9628     Discharge Medications: Please see discharge summary for a list of discharge medications.  Relevant Imaging Results:  Relevant Lab Results:   Additional Information SSN: [id number]  Alfredia Ferguson, LCSW

## 2021-07-07 LAB — GLUCOSE, CAPILLARY
Glucose-Capillary: 104 mg/dL — ABNORMAL HIGH (ref 70–99)
Glucose-Capillary: 196 mg/dL — ABNORMAL HIGH (ref 70–99)
Glucose-Capillary: 144 mg/dL — ABNORMAL HIGH (ref 70–99)
Glucose-Capillary: 141 mg/dL — ABNORMAL HIGH (ref 70–99)
Glucose-Capillary: 151 mg/dL — ABNORMAL HIGH (ref 70–99)
Glucose-Capillary: 118 mg/dL — ABNORMAL HIGH (ref 70–99)

## 2021-07-07 LAB — CULTURE, RESPIRATORY W GRAM STAIN

## 2021-07-07 LAB — URINE CULTURE: Culture: NO GROWTH

## 2021-07-07 NOTE — Plan of Care (Signed)
  Problem: Safety: Goal: Violent Restraint(s) Outcome: Progressing   Problem: Safety: Goal: Non-violent Restraint(s) Outcome: Progressing   Problem: Education: Goal: Knowledge of General Education information will improve Description: Including pain rating scale, medication(s)/side effects and non-pharmacologic comfort measures Outcome: Progressing   Problem: Health Behavior/Discharge Planning: Goal: Ability to manage health-related needs will improve Outcome: Progressing   Problem: Clinical Measurements: Goal: Ability to maintain clinical measurements within normal limits will improve Outcome: Progressing Goal: Will remain free from infection Outcome: Progressing Goal: Diagnostic test results will improve Outcome: Progressing Goal: Respiratory complications will improve Outcome: Progressing Goal: Cardiovascular complication will be avoided Outcome: Progressing   Problem: Activity: Goal: Risk for activity intolerance will decrease Outcome: Progressing   Problem: Nutrition: Goal: Adequate nutrition will be maintained Outcome: Progressing   Problem: Coping: Goal: Level of anxiety will decrease Outcome: Progressing   Problem: Elimination: Goal: Will not experience complications related to bowel motility Outcome: Progressing Goal: Will not experience complications related to urinary retention Outcome: Progressing   Problem: Pain Managment: Goal: General experience of comfort will improve Outcome: Progressing   Problem: Safety: Goal: Ability to remain free from injury will improve Outcome: Progressing   Problem: Skin Integrity: Goal: Risk for impaired skin integrity will decrease Outcome: Progressing   

## 2021-07-07 NOTE — Progress Notes (Signed)
Patient ID: Randall Girt., male   DOB: 08-29-1961, 60 y.o.   MRN: [id number]     Subjective: Awake but mumbles ROS negative except as listed above. Objective: Vital signs in last 24 hours: Temp:  [97.8 F (36.6 C)-99 F (37.2 C)] 97.8 F (36.6 C) (10/09 0759) Pulse Rate:  [87-103] 95 (10/09 0759) Resp:  [13-24] 20 (10/09 0759) BP: (118-168)/(75-96) 132/90 (10/09 0759) SpO2:  [96 %-97 %] 97 % (10/09 0759) Weight:  [75 kg] 75 kg (10/08 1700) Last BM Date: 07/06/21  Intake/Output from previous day: 10/08 0701 - 10/09 0700 In: 1968.9 [I.V.:1.6; JJ/OA:4166; IV Piggyback:393.3] Out: 950 [Urine:950] Intake/Output this shift: Total I/O In: -  Out: 300 [Urine:300]  General appearance: restrained. Mumbles. Awake today. MAE Resp: clear to auscultation bilaterally Cardio: regular rate and rhythm GI: soft, NT Extremities: calves soft Neuro: see above;  soft restraints  Lab Results: CBC  Recent Labs    07/05/21 0251 07/06/21 0210  WBC 13.1* 12.5*  HGB 13.0 13.1  HCT 41.1 40.8  PLT 284 286    BMET Recent Labs    07/05/21 0251  NA 140  K 4.2  CL 103  CO2 29  GLUCOSE 152*  BUN 19  CREATININE 0.77  CALCIUM 9.3    PT/INR No results for input(s): LABPROT, INR in the last 72 hours. ABG No results for input(s): PHART, HCO3 in the last 72 hours.  Invalid input(s): PCO2, PO2  Studies/Results: No results found.  Anti-infectives: Anti-infectives (From admission, onward)    Start     Dose/Rate Route Frequency Ordered Stop   07/05/21 1200  ceFEPIme (MAXIPIME) 2 g in sodium chloride 0.9 % 100 mL IVPB        2 g 200 mL/hr over 30 Minutes Intravenous Every 8 hours 07/05/21 1052     07/05/21 1130  ceFEPIme (MAXIPIME) 2 g in sodium chloride 0.9 % 100 mL IVPB  Status:  Discontinued        2 g 200 mL/hr over 30 Minutes Intravenous Every 12 hours 07/05/21 1042 07/05/21 1052   06/24/21 1800  ceFAZolin (ANCEF) IVPB 2g/100 mL premix        2 g 200 mL/hr over 30 Minutes  Intravenous Every 8 hours 06/24/21 1116 06/28/21 1849   06/22/21 1100  ceFEPIme (MAXIPIME) 2 g in sodium chloride 0.9 % 100 mL IVPB  Status:  Discontinued        2 g 200 mL/hr over 30 Minutes Intravenous Every 8 hours 06/22/21 0957 06/24/21 1116       Assessment/Plan: Found down   SAH, IVH, parenchymal contusion - NSGY c/s, Dr. Kathyrn Sheriff, repeat [facility] 9/18 w/ progression of SAH. F/U CT H evolving contusions and decreased SAH, small B hygromas. Continue TBI team therapies..  Skull fracture - NSGY c/s, Dr. Kathyrn Sheriff Nondisplaced facial fractures - ENT c/s, Dr. Claudia Desanctis, nonop, head of bed elevation to help with swelling, follow up outpatient Alcohol abuse - 186 on admission. CIWA, NH4 WNL VDRF - tolerated extubation 10/4 Generalized lymphadenopthy - will need outpatient W/U ?CLL  ID - resp cx 9/23 with S pneumo, empiric cefepime 9/24, de-escalate to cefazolin, completed 7d course. WBC up to 13.1 and TM 101.9 -2 night ago. Will give Maxipime empiric and check resp CX - few GPC/GNR and U/A - +nitrite, mod leuk, many bacteria, 21-50 wbc, urine cx P FEN - NPO, TF VTE - SCDs, Lovenox Dispo - to 4NP, TBI team therapies, may need SNF; f/u urine cx   LOS:  22 days    Leighton Ruff. Redmond Pulling, MD, FACS General, Bariatric, & Minimally Invasive Surgery [facility], [place]   07/07/2021

## 2021-07-08 LAB — GLUCOSE, CAPILLARY
Glucose-Capillary: 121 mg/dL — ABNORMAL HIGH (ref 70–99)
Glucose-Capillary: 131 mg/dL — ABNORMAL HIGH (ref 70–99)
Glucose-Capillary: 134 mg/dL — ABNORMAL HIGH (ref 70–99)
Glucose-Capillary: 169 mg/dL — ABNORMAL HIGH (ref 70–99)
Glucose-Capillary: 197 mg/dL — ABNORMAL HIGH (ref 70–99)

## 2021-07-08 NOTE — Progress Notes (Signed)
Found small bore TF pulled out upon entered room to administered bedtime medications while restrained patient very agitated, sliding down with bilateral legs over side rails. Stopped TF, intact bridle in nose removed.. Dr. Kieth Brightly paged and made aware. Leveda Anna, BSN, RN

## 2021-07-08 NOTE — Progress Notes (Signed)
Patient ID: Randall Girt., male   DOB: 02-Mar-1961, 60 y.o.   MRN: [id number]     Subjective: cc  Asleep, snoring. recently received klonopin, ativan, oxycodone, and seroquel. Per RN he has not been sleeping well for last 48 hours.  Objective: Vital signs in last 24 hours: Temp:  [97.6 F (36.4 C)-98.5 F (36.9 C)] 98.5 F (36.9 C) (10/10 0900) Pulse Rate:  [76-101] 87 (10/10 0825) Resp:  [13-22] 19 (10/10 0825) BP: (108-157)/(70-84) 146/71 (10/10 0825) SpO2:  [94 %-100 %] 99 % (10/10 0857) Weight:  [77.8 kg] 77.8 kg (10/10 0700) Last BM Date: 07/06/21  Intake/Output from previous day: 10/09 0701 - 10/10 0700 In: 1883 [NG/GT:1583; IV Piggyback:300] Out: 650 [Urine:650] Intake/Output this shift: Total I/O In: 251 [NG/GT:251] Out: -   General appearance: restrained. Asleep, snoring, cotrak in place Resp: clear to auscultation bilaterally Cardio: regular rate and rhythm GI: soft, NT Extremities: calves soft Neuro: see above;  soft restraints  Lab Results: CBC  Recent Labs    07/06/21 0210  WBC 12.5*  HGB 13.1  HCT 40.8  PLT 286   BMET No results for input(s): NA, K, CL, CO2, GLUCOSE, BUN, CREATININE, CALCIUM in the last 72 hours. PT/INR No results for input(s): LABPROT, INR in the last 72 hours. ABG No results for input(s): PHART, HCO3 in the last 72 hours.  Invalid input(s): PCO2, PO2  Studies/Results: No results found.  Anti-infectives: Anti-infectives (From admission, onward)    Start     Dose/Rate Route Frequency Ordered Stop   07/05/21 1200  ceFEPIme (MAXIPIME) 2 g in sodium chloride 0.9 % 100 mL IVPB        2 g 200 mL/hr over 30 Minutes Intravenous Every 8 hours 07/05/21 1052     07/05/21 1130  ceFEPIme (MAXIPIME) 2 g in sodium chloride 0.9 % 100 mL IVPB  Status:  Discontinued        2 g 200 mL/hr over 30 Minutes Intravenous Every 12 hours 07/05/21 1042 07/05/21 1052   06/24/21 1800  ceFAZolin (ANCEF) IVPB 2g/100 mL premix        2 g 200 mL/hr  over 30 Minutes Intravenous Every 8 hours 06/24/21 1116 06/28/21 1849   06/22/21 1100  ceFEPIme (MAXIPIME) 2 g in sodium chloride 0.9 % 100 mL IVPB  Status:  Discontinued        2 g 200 mL/hr over 30 Minutes Intravenous Every 8 hours 06/22/21 0957 06/24/21 1116       Assessment/Plan: Found down   SAH, IVH, parenchymal contusion - NSGY c/s, Dr. Kathyrn Sheriff, repeat [facility] 9/18 w/ progression of SAH. F/U CT H evolving contusions and decreased SAH, small B hygromas. Continue TBI team therapies..  Skull fracture - NSGY c/s, Dr. Kathyrn Sheriff Nondisplaced facial fractures - ENT c/s, Dr. Claudia Desanctis, nonop, head of bed elevation to help with swelling, follow up outpatient Alcohol abuse - 186 on admission. CIWA, NH4 WNL VDRF - tolerated extubation 10/4 Generalized lymphadenopthy - will need outpatient W/U ?CLL  ID - resp cx 9/23 with S pneumo, empiric cefepime 9/24, de-escalate to cefazolin, completed 7d course. WBC up to 13.1 and TM 101.9 -2 night ago. Will give Maxipime empiric and check resp CX - few GPC/GNR and U/A - +nitrite, mod leuk, many bacteria, 21-50 wbc, urine cx 10/8 NGTD FEN - NPO, TF VTE - SCDs, Lovenox Dispo - to 4NP, TBI team therapies, may need SNF   LOS: 23 days    Obie Dredge, PA-C [place]  [facility], [place]   07/08/2021

## 2021-07-08 NOTE — Plan of Care (Signed)
  Problem: Safety: Goal: Violent Restraint(s) Outcome: Progressing   Problem: Safety: Goal: Non-violent Restraint(s) Outcome: Progressing   Problem: Education: Goal: Knowledge of General Education information will improve Description: Including pain rating scale, medication(s)/side effects and non-pharmacologic comfort measures Outcome: Progressing   Problem: Health Behavior/Discharge Planning: Goal: Ability to manage health-related needs will improve Outcome: Progressing   Problem: Clinical Measurements: Goal: Ability to maintain clinical measurements within normal limits will improve Outcome: Progressing Goal: Will remain free from infection Outcome: Progressing Goal: Diagnostic test results will improve Outcome: Progressing Goal: Respiratory complications will improve Outcome: Progressing Goal: Cardiovascular complication will be avoided Outcome: Progressing   Problem: Activity: Goal: Risk for activity intolerance will decrease Outcome: Progressing   Problem: Nutrition: Goal: Adequate nutrition will be maintained Outcome: Progressing   Problem: Coping: Goal: Level of anxiety will decrease Outcome: Progressing   Problem: Elimination: Goal: Will not experience complications related to bowel motility Outcome: Progressing Goal: Will not experience complications related to urinary retention Outcome: Progressing   Problem: Pain Managment: Goal: General experience of comfort will improve Outcome: Progressing   Problem: Safety: Goal: Ability to remain free from injury will improve Outcome: Progressing   Problem: Skin Integrity: Goal: Risk for impaired skin integrity will decrease Outcome: Progressing   

## 2021-07-09 LAB — GLUCOSE, CAPILLARY
Glucose-Capillary: 118 mg/dL — ABNORMAL HIGH (ref 70–99)
Glucose-Capillary: 132 mg/dL — ABNORMAL HIGH (ref 70–99)
Glucose-Capillary: 125 mg/dL — ABNORMAL HIGH (ref 70–99)
Glucose-Capillary: 134 mg/dL — ABNORMAL HIGH (ref 70–99)
Glucose-Capillary: 127 mg/dL — ABNORMAL HIGH (ref 70–99)
Glucose-Capillary: 117 mg/dL — ABNORMAL HIGH (ref 70–99)

## 2021-07-09 MED ORDER — HALOPERIDOL LACTATE 5 MG/ML IJ SOLN
5.0000 mg | Freq: Four times a day (QID) | INTRAMUSCULAR | Status: DC | PRN
  Administered 2021-07-09 – 2021-07-18 (×11): 5 mg via INTRAVENOUS
  Filled 2021-07-09 (×12): qty 1

## 2021-07-09 MED ORDER — HALOPERIDOL LACTATE 5 MG/ML IJ SOLN
5.0000 mg | Freq: Four times a day (QID) | INTRAMUSCULAR | Status: DC | PRN
  Administered 2021-07-10: 5 mg via INTRAMUSCULAR
  Filled 2021-07-09 (×2): qty 1

## 2021-07-09 NOTE — Plan of Care (Signed)
  Problem: Safety: Goal: Non-violent Restraint(s) Outcome: Progressing   Problem: Clinical Measurements: Goal: Respiratory complications will improve Outcome: Progressing Goal: Cardiovascular complication will be avoided Outcome: Progressing   Problem: Nutrition: Goal: Adequate nutrition will be maintained Outcome: Progressing   Problem: Elimination: Goal: Will not experience complications related to bowel motility Outcome: Progressing Goal: Will not experience complications related to urinary retention Outcome: Progressing   Problem: Pain Managment: Goal: General experience of comfort will improve Outcome: Progressing   Problem: Safety: Goal: Ability to remain free from injury will improve Outcome: Progressing   Problem: Education: Goal: Knowledge of General Education information will improve Outcome: Not Progressing   Problem: Coping: Goal: Level of anxiety will decrease Outcome: Not Progressing

## 2021-07-09 NOTE — Progress Notes (Signed)
Speech Language Pathology Treatment: Cognitive-Linquistic  Patient Details Name: Randall Harvey. MRN: [id number] DOB: June 08, 1961 Today's Date: 07/09/2021 Time: 1000-1017 SLP Time Calculation (min) (ACUTE ONLY): 17 min  Assessment / Plan / Recommendation Clinical Impression  Pt pulled cortrak last night and is potentially scheduled for PEG today.  Restraints released and repositioned for oral care.  Pt verbalizing, generally incoherently with islands of clear speech that are marked by language of confusion. He is beginning to follow some simple commands ~25% of time when they take place in predictable contexts (washing face, brushing teeth).  He brushed his teeth with RUE with physical assist; attended to entire area of mouth well; needed verbal cues to complete task.  No spontaneous swallowing observed during oral care.  Intermittent throat-clearing observed but did not follow cues for coughing. Pt stated first and last name when asked, but offered no further biographical info, neither spontaneously or with choice of two.  Required max cues for overall attention.   Not ready for POs; still functioning at a Rancho Level IV during today's session.  SLP will follow for cognition/dysphagia.  Pt would benefit from OT/PT assessments.   HPI HPI: Pt is 60 yo male found down and inebriated with blood from L ear. Found to have L SAH and L temporal bone, L sphenoid, L maxillary fxs. PMH: COPD, HTN, PE, renal cell carcinoma, CVA, thrombocytopenia. Intubated 9/22-10/4      SLP Plan  Continue with current plan of care      Recommendations for follow up therapy are one component of a multi-disciplinary discharge planning process, led by the attending physician.  Recommendations may be updated based on patient status, additional functional criteria and insurance authorization.    Recommendations  Diet recommendations: NPO                Oral Care Recommendations: Oral care QID Plan: Continue with  current plan of care                   L. Tivis Ringer, [place] Office number [phone] Pager [phone]   Juan Quam Laurice  07/09/2021, 10:30 AM

## 2021-07-09 NOTE — Plan of Care (Signed)
  Problem: Safety: Goal: Violent Restraint(s) Outcome: Progressing   Problem: Safety: Goal: Non-violent Restraint(s) Outcome: Progressing   Problem: Education: Goal: Knowledge of General Education information will improve Description: Including pain rating scale, medication(s)/side effects and non-pharmacologic comfort measures Outcome: Progressing   Problem: Health Behavior/Discharge Planning: Goal: Ability to manage health-related needs will improve Outcome: Progressing   Problem: Clinical Measurements: Goal: Ability to maintain clinical measurements within normal limits will improve Outcome: Progressing Goal: Will remain free from infection Outcome: Progressing Goal: Diagnostic test results will improve Outcome: Progressing Goal: Respiratory complications will improve Outcome: Progressing Goal: Cardiovascular complication will be avoided Outcome: Progressing   Problem: Activity: Goal: Risk for activity intolerance will decrease Outcome: Progressing   Problem: Nutrition: Goal: Adequate nutrition will be maintained Outcome: Progressing   Problem: Coping: Goal: Level of anxiety will decrease Outcome: Progressing   Problem: Elimination: Goal: Will not experience complications related to bowel motility Outcome: Progressing Goal: Will not experience complications related to urinary retention Outcome: Progressing   Problem: Pain Managment: Goal: General experience of comfort will improve Outcome: Progressing   Problem: Safety: Goal: Ability to remain free from injury will improve Outcome: Progressing   Problem: Skin Integrity: Goal: Risk for impaired skin integrity will decrease Outcome: Progressing   

## 2021-07-09 NOTE — Progress Notes (Signed)
Patient pulled cortrak out last night and cortrak team not here today.  Orders received to place NGT.  Patient uncooperative and does not follow most commands.  Patient does not have a gag reflex at this time.  Attempted placement of NGT with Musician but unsuccessful after multiple tries.  Dr. Bobbye Morton made aware and ok not to attempt further, as she is trying to get him scheduled for placement of a PEG tube.  Will continue to monitor patient.

## 2021-07-09 NOTE — Progress Notes (Signed)
Trauma/Critical Care Follow Up Note  Subjective:    Overnight Issues:   Objective:  Vital signs for last 24 hours: Temp:  [97.7 F (36.5 C)-99 F (37.2 C)] 97.7 F (36.5 C) (10/11 0750) Pulse Rate:  [70-91] 84 (10/11 0750) Resp:  [12-22] 20 (10/11 0750) BP: (116-152)/(79-92) 130/79 (10/11 0750) SpO2:  [91 %-100 %] 94 % (10/11 0750) Weight:  [72.3 kg] 72.3 kg (10/11 0618)  Hemodynamic parameters for last 24 hours:    Intake/Output from previous day: 10/10 0701 - 10/11 0700 In: 1908.6 [I.V.:164.6; NG/GT:1444; IV Piggyback:300] Out: 300 [Urine:300]  Intake/Output this shift: No intake/output data recorded.  Vent settings for last 24 hours:    Physical Exam:  Gen: comfortable, no distress Neuro: interactive, not f/c HEENT: PERRL Neck: supple CV: RRR Pulm: unlabored breathing Abd: soft, NT GU: clear yellow urine Extr: wwp, no edema   Results for orders placed or performed during the hospital encounter of 06/15/21 (from the past 24 hour(s))  Glucose, capillary     Status: Abnormal   Collection Time: 07/08/21  4:47 PM  Result Value Ref Range   Glucose-Capillary 197 (H) 70 - 99 mg/dL   Comment 1 Notify RN    Comment 2 Document in Chart   Glucose, capillary     Status: Abnormal   Collection Time: 07/08/21  8:25 PM  Result Value Ref Range   Glucose-Capillary 118 (H) 70 - 99 mg/dL  Glucose, capillary     Status: Abnormal   Collection Time: 07/08/21 11:38 PM  Result Value Ref Range   Glucose-Capillary 131 (H) 70 - 99 mg/dL  Glucose, capillary     Status: Abnormal   Collection Time: 07/09/21  5:08 AM  Result Value Ref Range   Glucose-Capillary 132 (H) 70 - 99 mg/dL  Glucose, capillary     Status: Abnormal   Collection Time: 07/09/21  8:10 AM  Result Value Ref Range   Glucose-Capillary 125 (H) 70 - 99 mg/dL  Glucose, capillary     Status: Abnormal   Collection Time: 07/09/21 11:22 AM  Result Value Ref Range   Glucose-Capillary 134 (H) 70 - 99 mg/dL     Assessment & Plan: The plan of care was discussed with the bedside nurse for the day, who is in agreement with this plan and no additional concerns were raised.   Present on Admission:  Skull fracture ([place])    LOS: 24 days   Additional comments:I reviewed the patient's new clinical lab test results.   and I reviewed the patients new imaging test results.    Found down   SAH, IVH, parenchymal contusion - NSGY c/s, Dr. Kathyrn Sheriff, repeat [facility] 9/18 w/ progression of SAH. F/U CT H evolving contusions and decreased SAH, small B hygromas. Continue TBI team therapies..  Skull fracture - NSGY c/s, Dr. Kathyrn Sheriff Nondisplaced facial fractures - ENT c/s, Dr. Claudia Desanctis, nonop, head of bed elevation to help with swelling, follow up outpatient Alcohol abuse - 186 on admission. CIWA, NH4 WNL VDRF - tolerated extubation 10/4 Generalized lymphadenopthy - will need outpatient W/U ?CLL  ID - resp cx 9/23 with S pneumo, completed 7d course. Resp cx 10/7 with Enterbacter aerogenes, another 7d course of cefepime, ucx negative 10/8 FEN - NPO, TF VTE - SCDs, Lovenox Dispo - 4NP, PT/OT, likely need SNF, plan for PEG this week. Consent obtained from both son and daughter listed in the chart. All risks and benefits explained and questions answered.     Jesusita Oka, MD  Trauma & General Surgery Please use [email or web address] to contact on call provider  07/09/2021  *Care during the described time interval was provided by me. I have reviewed this patient's available data, including medical history, events of note, physical examination and test results as part of my evaluation.

## 2021-07-09 NOTE — Evaluation (Signed)
Physical Therapy Re Evaluation Patient Details Name: Randall Harvey. MRN: [id number] DOB: June 23, 1961 Today's Date: 07/09/2021  History of Present Illness  Pt is 60 yo male found down and inebriated with blood from L ear. Found to have L SAH and L temporal bone, L sphenoid, L maxillary fxs. Intubated 9/22, PT signed off and was reordered. PMH: COPD, HTN, PE, renal cell carcinoma, CVA, thrombocytopenia.  Clinical Impression  Pt admitted with above diagnosis. Pt intermittently lethargic on eval and with mostly unintelligible speech. Pt reaches for therapist any chance he gets which makes treatment complicated. RN present during re eval for +2. Pt had difficulty sequencing bed mobility and needed max A to come to EOB. Stood EOB with max A +2 but was unable to extend knees fully or step feet. After session pt returned to supine with restraints reapplied.  Pt currently with functional limitations due to the deficits listed below (see PT Problem List). Pt will benefit from skilled PT to increase their independence and safety with mobility to allow discharge to the venue listed below.          Recommendations for follow up therapy are one component of a multi-disciplinary discharge planning process, led by the attending physician.  Recommendations may be updated based on patient status, additional functional criteria and insurance authorization.  Follow Up Recommendations SNF;Supervision/Assistance - 24 hour    Equipment Recommendations  Other (comment) (TBD)    Recommendations for Other Services OT consult     Precautions / Restrictions Precautions Precautions: Fall Restrictions Weight Bearing Restrictions: No      Mobility  Bed Mobility Overal bed mobility: Needs Assistance Bed Mobility: Supine to Sit;Sit to Supine     Supine to sit: Max assist;HOB elevated Sit to supine: Max assist;+2 for physical assistance   General bed mobility comments: pt's LE's assisted off bed and then when  therapist would assist pt in elevation of trunk he would place LE's back onto bed preventing coming to sitting. Could not comprehend the task. Eventually was able to keep LE's down and pt came ti sitting with max A. Max A +2 for return to supine    Transfers Overall transfer level: Needs assistance Equipment used: 2 person hand held assist Transfers: Sit to/from Stand Sit to Stand: Max assist;+2 physical assistance         General transfer comment: feet blocked and pt unable to fully extend knees. Stood 5 secs with max A +2  Ambulation/Gait             General Gait Details: pt unable to step feet in standing  Stairs            Wheelchair Mobility    Modified Rankin (Stroke Patients Only) Modified Rankin (Stroke Patients Only) Pre-Morbid Rankin Score: No symptoms Modified Rankin: Severe disability     Balance Overall balance assessment: Needs assistance Sitting-balance support: No upper extremity supported;Feet unsupported Sitting balance-Leahy Scale: Poor Sitting balance - Comments: needed mod A to maintain sitting, would lean fwd to try to grab therapist and lose balance fwd   Standing balance support: Bilateral upper extremity supported Standing balance-Leahy Scale: Zero Standing balance comment: max A +2 to maintain standing                             Pertinent Vitals/Pain Pain Assessment: Faces Faces Pain Scale: Hurts little more Pain Location: generalized Pain Descriptors / Indicators: Grimacing;Guarding Pain Intervention(s): Limited activity within  patient's tolerance;Monitored during session    Home Living Family/patient expects to be discharged to:: Unsure                 Additional Comments: pt unable to answer questions meaningfully, was apparently homeless    Prior Function Level of Independence: Independent               Hand Dominance        Extremity/Trunk Assessment   Upper Extremity Assessment Upper  Extremity Assessment: Generalized weakness    Lower Extremity Assessment Lower Extremity Assessment: Generalized weakness    Cervical / Trunk Assessment Cervical / Trunk Assessment: Kyphotic  Communication   Communication: Other (comment) (much unintelligible verbalization)  Cognition Arousal/Alertness: Lethargic (intermittent) Behavior During Therapy: Impulsive;Restless Overall Cognitive Status: Impaired/Different from baseline Area of Impairment: Orientation;Attention;Memory;Following commands;Safety/judgement;Awareness;Problem solving;JFK Recovery Scale               Rancho Levels of Cognitive Functioning Ranch[place] Amigos Scales of Cognitive Functioning: Confused/agitated Orientation Level: Disoriented to;Situation;Time;Place Current Attention Level: Focused Memory: Decreased recall of precautions;Decreased short-term memory Following Commands: Follows one step commands inconsistently Safety/Judgement: Decreased awareness of safety;Decreased awareness of deficits Awareness: Intellectual Problem Solving: Decreased initiation;Difficulty sequencing;Requires verbal cues;Requires tactile cues;Slow processing General Comments: Incomprehensible speech during session. Grabs for therapist and nurse whenever he can reach,      General Comments General comments (skin integrity, edema, etc.): VSS    Exercises     Assessment/Plan    PT Assessment Patient needs continued PT services  PT Problem List Decreased strength;Decreased range of motion;Decreased activity tolerance;Decreased balance;Decreased mobility;Decreased coordination;Decreased cognition;Decreased knowledge of use of DME;Decreased safety awareness;Decreased knowledge of precautions;Cardiopulmonary status limiting activity       PT Treatment Interventions DME instruction;Gait training;Stair training;Functional mobility training;Therapeutic activities;Therapeutic exercise;Balance training;Neuromuscular  re-education;Cognitive remediation;Patient/family education    PT Goals (Current goals can be found in the Care Plan section)  Acute Rehab PT Goals Patient Stated Goal: none stated PT Goal Formulation: Patient unable to participate in goal setting Time For Goal Achievement: 07/16/21 Potential to Achieve Goals: Fair    Frequency Min 4X/week   Barriers to discharge        Co-evaluation               AM-PAC PT "6 Clicks" Mobility  Outcome Measure Help needed turning from your back to your side while in a flat bed without using bedrails?: A Lot Help needed moving from lying on your back to sitting on the side of a flat bed without using bedrails?: A Lot Help needed moving to and from a bed to a chair (including a wheelchair)?: A Lot Help needed standing up from a chair using your arms (e.g., wheelchair or bedside chair)?: Total Help needed to walk in hospital room?: Total Help needed climbing 3-5 steps with a railing? : Total 6 Click Score: 9    End of Session   Activity Tolerance: Patient tolerated treatment well Patient left: in bed;with call bell/phone within reach;with restraints reapplied;Other (comment) (RN present) Nurse Communication: Mobility status PT Visit Diagnosis: Difficulty in walking, not elsewhere classified (R26.2)    Time: 0092-3300 PT Time Calculation (min) (ACUTE ONLY): 26 min   Charges:   PT Evaluation $PT Re-evaluation: 1 Re-eval PT Treatments $Therapeutic Activity: 8-22 mins        Leighton Roach, PT  Acute Rehab Services  Pager [phone] Office [place] 07/09/2021, 2:13 PM

## 2021-07-10 LAB — GLUCOSE, CAPILLARY
Glucose-Capillary: 100 mg/dL — ABNORMAL HIGH (ref 70–99)
Glucose-Capillary: 105 mg/dL — ABNORMAL HIGH (ref 70–99)
Glucose-Capillary: 109 mg/dL — ABNORMAL HIGH (ref 70–99)
Glucose-Capillary: 111 mg/dL — ABNORMAL HIGH (ref 70–99)
Glucose-Capillary: 122 mg/dL — ABNORMAL HIGH (ref 70–99)
Glucose-Capillary: 127 mg/dL — ABNORMAL HIGH (ref 70–99)
Glucose-Capillary: 92 mg/dL (ref 70–99)

## 2021-07-10 MED ORDER — MORPHINE SULFATE (PF) 2 MG/ML IV SOLN
1.0000 mg | INTRAVENOUS | Status: DC | PRN
  Administered 2021-07-10 (×2): 2 mg via INTRAVENOUS
  Filled 2021-07-10 (×2): qty 1

## 2021-07-10 MED ORDER — PROSOURCE TF PO LIQD: 45.0000 mL | Freq: Two times a day (BID) | ORAL | Status: DC

## 2021-07-10 MED ORDER — VITAL 1.5 CAL PO LIQD
1000.0000 mL | ORAL | Status: DC
  Filled 2021-07-10: qty 1000

## 2021-07-10 NOTE — Progress Notes (Signed)
Trauma/Critical Care Follow Up Note  Subjective:    Overnight Issues:  Pulled cortrak out.  No other issues.  Doesn't really talk much this am except he says his head hurts. Objective:  Vital signs for last 24 hours: Temp:  [97.5 F (36.4 C)-98.6 F (37 C)] 97.5 F (36.4 C) (10/12 0711) Pulse Rate:  [73-109] 73 (10/12 0711) Resp:  [12-20] 12 (10/12 0711) BP: (129-153)/(63-91) 129/77 (10/12 0711) SpO2:  [96 %-99 %] 99 % (10/12 0711)    Intake/Output from previous day: 10/11 0701 - 10/12 0700 In: -  Out: 300 [Stool:300]  Intake/Output this shift: No intake/output data recorded.  Physical Exam:  Gen: comfortable, no distress, but a bit fidgety  Neuro: mildly interactive, does follow commands HEENT: PERRL Neck: supple CV: RRR Pulm: unlabored breathing Abd: soft, NT GU: clear yellow urine Extr: wwp, no edema, wrists in soft restraints   Results for orders placed or performed during the hospital encounter of 06/15/21 (from the past 24 hour(s))  Glucose, capillary     Status: Abnormal   Collection Time: 07/09/21 11:22 AM  Result Value Ref Range   Glucose-Capillary 134 (H) 70 - 99 mg/dL  Glucose, capillary     Status: Abnormal   Collection Time: 07/09/21  3:30 PM  Result Value Ref Range   Glucose-Capillary 127 (H) 70 - 99 mg/dL  Glucose, capillary     Status: Abnormal   Collection Time: 07/09/21  8:05 PM  Result Value Ref Range   Glucose-Capillary 117 (H) 70 - 99 mg/dL  Glucose, capillary     Status: Abnormal   Collection Time: 07/10/21 12:01 AM  Result Value Ref Range   Glucose-Capillary 109 (H) 70 - 99 mg/dL  Glucose, capillary     Status: Abnormal   Collection Time: 07/10/21  3:31 AM  Result Value Ref Range   Glucose-Capillary 111 (H) 70 - 99 mg/dL  Glucose, capillary     Status: Abnormal   Collection Time: 07/10/21  7:46 AM  Result Value Ref Range   Glucose-Capillary 127 (H) 70 - 99 mg/dL   Comment 1 Notify RN    Comment 2 Document in Chart      Assessment & Plan: The plan of care was discussed with the bedside nurse for the day, who is in agreement with this plan and no additional concerns were raised.   Present on Admission:  Skull fracture ([place])    LOS: 25 days   Found down   SAH, IVH, parenchymal contusion - NSGY c/s, Dr. Kathyrn Sheriff, repeat [facility] 9/18 w/ progression of SAH. F/U CT H evolving contusions and decreased SAH, small B hygromas. Continue TBI team therapies..  Skull fracture - NSGY c/s, Dr. Kathyrn Sheriff Nondisplaced facial fractures - ENT c/s, Dr. Claudia Desanctis, nonop, head of bed elevation to help with swelling, follow up outpatient Alcohol abuse - 186 on admission. CIWA, NH4 WNL VDRF - tolerated extubation 10/4 Generalized lymphadenopthy - will need outpatient W/U ?CLL  ID - resp cx 9/23 with S pneumo, completed 7d course. Resp cx 10/7 with Enterbacter aerogenes, another 7d course of cefepime, ucx negative 10/8 Dysphagia - hold on Cortrak replacement today, plan for PEG tomorrow.  Hold meds through tube and will add IV pain meds as needed until able to resume per tube meds tomorrow. FEN - NPO, resume TFs and meds per tube tomorrow after PEG.  Has some IV meds in place for now VTE - SCDs, Lovenox Dispo - 4NP, PT/OT, likely need SNF, plan for PEG tomorrow.  Consent obtained from both son and daughter listed in the chart.     Henreitta Cea, PA-C Trauma & General Surgery Please use [email or web address] to contact on call provider  07/10/2021

## 2021-07-10 NOTE — Progress Notes (Signed)
Physical Therapy Treatment Patient Details Name: Randall Harvey. MRN: [id number] DOB: December 26, 1960 Today's Date: 07/10/2021   History of Present Illness Pt is 59 yo male found down and inebriated with blood from L ear. Found to have L SAH and L temporal bone, L sphenoid, L maxillary fxs. Intubated 9/22, PT signed off and was reordered. PMH: COPD, HTN, PE, renal cell carcinoma, CVA, thrombocytopenia.    PT Comments    Pt remains both cognitive and functionally impaired. Pt continues to be restless, not aware of situation or place, appears to have visual impairment, impaired sequencing, impaired processing, impaired balance, and generalized weakness. Pt requiring maxAx2 for all mobility today including maintain EOB balance, pt with difficulty tracking with eyes. Pt perseverating on calling his dog "little girl", whistling and trying to find her. Continue to recommend SNF upon d/c. Acute PT to cont to follow.   Recommendations for follow up therapy are one component of a multi-disciplinary discharge planning process, led by the attending physician.  Recommendations may be updated based on patient status, additional functional criteria and insurance authorization.  Follow Up Recommendations  SNF;Supervision/Assistance - 24 hour     Equipment Recommendations  Other (comment) (TBD)    Recommendations for Other Services OT consult     Precautions / Restrictions Precautions Precautions: Fall Precaution Comments: condom cath Restrictions Weight Bearing Restrictions: No     Mobility  Bed Mobility Overal bed mobility: Needs Assistance Bed Mobility: Rolling;Sidelying to Sit Rolling: Max assist Sidelying to sit: Total assist;+2 for physical assistance Supine to sit: Total assist;Max assist     General bed mobility comments: exiting on the R side. pt needs (A) for R LE to eob. pt pushing up and needs (A) to coordinate movement and for trunk elevation, pt with report of dizziness upon  transfer, pt requiring constant tactile cues    Transfers Overall transfer level: Needs assistance Equipment used: 2 person hand held assist Transfers: Sit to/from Stand;Stand Pivot Transfers Sit to Stand: Max assist;+2 physical assistance Stand pivot transfers: Max assist;+2 physical assistance       General transfer comment: maxAx2 to power up, maxA to advance R LE towards chair with max tactile cues for weight shifting, pt stiffening legs and holding in extension  Ambulation/Gait             General Gait Details: pt unable   Stairs             Wheelchair Mobility    Modified Rankin (Stroke Patients Only) Modified Rankin (Stroke Patients Only) Pre-Morbid Rankin Score: No symptoms Modified Rankin: Severe disability     Balance Overall balance assessment: Needs assistance Sitting-balance support: Bilateral upper extremity supported;Feet supported Sitting balance-Leahy Scale: Poor Sitting balance - Comments: needed mod A to maintain sitting, would lean fwd to try to grab therapist and lose balance fwd   Standing balance support: Bilateral upper extremity supported;During functional activity Standing balance-Leahy Scale: Zero Standing balance comment: max A +2 to maintain standing                            Cognition Arousal/Alertness: Awake/alert Behavior During Therapy: Impulsive;Restless Overall Cognitive Status: Impaired/Different from baseline Area of Impairment: Orientation;Attention;Memory;Following commands;Safety/judgement;Awareness;Problem solving;JFK Recovery Scale               Rancho Levels of Cognitive Functioning Rancho Los Amigos Scales of Cognitive Functioning: Confused/inappropriate/non-agitated Orientation Level: Disoriented to;Situation;Time;Place Current Attention Level: Sustained Memory: Decreased recall of precautions;Decreased short-term  memory Following Commands: Follows one step commands  inconsistently Safety/Judgement: Decreased awareness of safety;Decreased awareness of deficits Awareness: Intellectual Problem Solving: Decreased initiation;Difficulty sequencing;Requires verbal cues;Requires tactile cues;Slow processing General Comments: pt at times talking about topics not related. pt calling and whistles for "little girl" his dog. Pt requesting his dog to come. Pt reading "[facility]" but not able to immediately able to verbalize information back to therapist, pt unable to initiate or sequence tasks asked, unsure pt's visual ability as pt unable to track but will see items      Exercises      General Comments General comments (skin integrity, edema, etc.): VSS      Pertinent Vitals/Pain Pain Assessment: Faces Faces Pain Scale: Hurts a little bit Pain Location: generalized Pain Descriptors / Indicators: Grimacing;Guarding Pain Intervention(s): Monitored during session    Home Living Family/patient expects to be discharged to:: Unsure                    Prior Function            PT Goals (current goals can now be found in the care plan section) Acute Rehab PT Goals Patient Stated Goal: wants to find his dog "little girl" PT Goal Formulation: Patient unable to participate in goal setting Time For Goal Achievement: 07/16/21 Potential to Achieve Goals: Fair Progress towards PT goals: Progressing toward goals    Frequency    Min 4X/week      PT Plan Current plan remains appropriate    Co-evaluation PT/OT/SLP Co-Evaluation/Treatment: Yes Reason for Co-Treatment: Complexity of the patient's impairments (multi-system involvement) PT goals addressed during session: Mobility/safety with mobility OT goals addressed during session: ADL's and self-care;Proper use of Adaptive equipment and DME;Strengthening/ROM SLP goals addressed during session: Swallowing;Cognition;Communication    AM-PAC PT "6 Clicks" Mobility   Outcome Measure  Help  needed turning from your back to your side while in a flat bed without using bedrails?: A Lot Help needed moving from lying on your back to sitting on the side of a flat bed without using bedrails?: A Lot Help needed moving to and from a bed to a chair (including a wheelchair)?: A Lot Help needed standing up from a chair using your arms (e.g., wheelchair or bedside chair)?: Total Help needed to walk in hospital room?: Total Help needed climbing 3-5 steps with a railing? : Total 6 Click Score: 9    End of Session Equipment Utilized During Treatment: Gait belt Activity Tolerance: Patient tolerated treatment well Patient left: with call bell/phone within reach;with restraints reapplied;Other (comment);in chair;with chair alarm set (SLP and OT present) Nurse Communication: Mobility status PT Visit Diagnosis: Difficulty in walking, not elsewhere classified (R26.2)     Time: 2542-7062 PT Time Calculation (min) (ACUTE ONLY): 16 min  Charges:  $Therapeutic Activity: 8-22 mins                     Kittie Plater, PT, DPT Acute Rehabilitation Services Pager #: [phone] Office #: [phone]    Berline Lopes 07/10/2021, 1:41 PM

## 2021-07-10 NOTE — Progress Notes (Signed)
Nutrition Follow-up  DOCUMENTATION CODES:   Non-severe (moderate) malnutrition in context of chronic illness  INTERVENTION:   Once PEG tube placed and cleared for use, resume tube feeds: - Vital 1.5 @ 65 ml/hr (1560 ml/day) - ProSource TF 45 ml BID  Tube feeding regimen will provide 2420 kcal, 127 grams of protein, and 1192 ml of H2O.   - Continue MVI with minerals daily per tube  NUTRITION DIAGNOSIS:   Moderate Malnutrition related to chronic illness (COPD) as evidenced by moderate fat depletion, moderate muscle depletion.  Ongoing  GOAL:   Patient will meet greater than or equal to 90% of their needs  Unmet at this time, pt is NPO without enteral access  MONITOR:   Diet advancement, Labs, Weight trends, TF tolerance, I & O's  REASON FOR ASSESSMENT:   Consult Enteral/tube feeding initiation and management, Assessment of nutrition requirement/status  ASSESSMENT:   60 year old male who presented to the ED on 9/17 after being found down with blood draining from L ear after unknown trauma. Pt was intoxicated. PMH of renal cell carcinoma, HTN, stroke, COPD. Pt found to have SAH, IVH, parenchymal contusion, skull fx, nondisplaced facial fx.  09/21 - Cortrak placed (tip gastric) 09/22 - intubated for airway protection  10/04 - extubated 10/05 - s/p BSE recommending NPO 10/10 - pt pulled out Cortrak tube 10/11 - RN attempted NG tube placement which was unsuccessful  Discussed pt with Trauma MD. Per MD, do not replace Cortrak today as pt is going to have PEG placed tomorrow. Pt has been without nutrition since Cortrak pulled out by pt on 10/10 and will likely not have PEG cleared for use for enteral nutrition until 10/14 (24 hours after placement). SLP continues to follow for potential for diet advancement.  Admit weight: 72.6 kg Current weight: 72.3 kg  Medications reviewed and include: folic acid, SSI q 4 hours, MVI with minerals daily, thiamine, IV abx  Labs  reviewed. CBG's: 109-127 x 24 hours  Diet Order:   Diet Order             Diet NPO time specified  Diet effective midnight           Diet NPO time specified  Diet effective now                   EDUCATION NEEDS:   Not appropriate for education at this time  Skin:  Skin Assessment: Reviewed RN Assessment  Last BM:  07/09/21 small type 7  Height:   Ht Readings from Last 1 Encounters:  06/15/21 5\' 9"  (1.753 m)    Weight:   Wt Readings from Last 1 Encounters:  07/09/21 72.3 kg    BMI:  Body mass index is 23.54 kg/m.  Estimated Nutritional Needs:   Kcal:  1610-9604  Protein:  115-135 grams  Fluid:  > 2 L    Gustavus Bryant, MS, RD, LDN Inpatient Clinical Dietitian Please see AMiON for contact information.

## 2021-07-10 NOTE — Progress Notes (Signed)
Speech Language Pathology Treatment: Dysphagia;Cognitive-Linquistic  Patient Details Name: Randall Harvey. MRN: [id number] DOB: May 10, 1961 Today's Date: 07/10/2021 Time: 1105-1140 SLP Time Calculation (min) (ACUTE ONLY): 35 min  Assessment / Plan / Recommendation Clinical Impression  Pt was seen in conjunction with PT and OT to address swallowing and cognition goals. Pt notably more alert and responsive this date. SLP assisted pt in completing oral care and trailed thin liquid from cup and pudding from spoon. Pt able to self feed but required assistance to obtain small spoonfuls from pudding cup. Across both consistencies, pt exhibited immediate and delayed weak cough despite cues to increase cough strength. Pt exhibiting impulsivity and deficits in orientation, awareness, short term memory, and problem solving; required intermittent reminders of location and often reverted to believing he was at home, calling for his dog "little girl". SLP placed visual reminder of location in pt room to help improve orientation. Recommend NPO, administering medications via alternative means. Pt is moving closer to readiness for an MBS when he can sustain alertness and ability to reach adequate PO intake, however pt having PEG procedure tomorrow and will remain NPO for procedure. SLP will follow for MBS as pt readiness continues to improve and for therapy targeting cognitive and swallowing goals.    HPI HPI: Pt is 60 yo male found down and inebriated with blood from L ear. Found to have L SAH and L temporal bone, L sphenoid, L maxillary fxs. PMH: COPD, HTN, PE, renal cell carcinoma, CVA, thrombocytopenia. Intubated 9/22-10/4      SLP Plan  Continue with current plan of care      Recommendations for follow up therapy are one component of a multi-disciplinary discharge planning process, led by the attending physician.  Recommendations may be updated based on patient status, additional functional criteria and  insurance authorization.    Recommendations  Diet recommendations: NPO Medication Administration: Via alternative means                General recommendations: Rehab consult Oral Care Recommendations: Oral care QID Follow up Recommendations: Inpatient Rehab SLP Visit Diagnosis: Dysphagia, unspecified (R13.10);Cognitive communication deficit [phone]) Plan: Continue with current plan of care       GO              Dewitt Rota, SLP-Student   Dewitt Rota  07/10/2021, 2:20 PM

## 2021-07-10 NOTE — Progress Notes (Addendum)
Occupational Therapy Re evaluation Patient Details Name: Randall Harvey "Call me [name]" MRN: [id number] DOB: April 09, 1961 Today's Date: 07/10/2021   History of present illness Pt is 60 yo male found down and inebriated with blood from L ear. Found to have L SAH and L temporal bone, L sphenoid, L maxillary fxs. Intubated 9/22, PT signed off and was reordered. PMH: COPD, HTN, PE, renal cell carcinoma, CVA, thrombocytopenia.   OT comments  Pt motivated to get oob this session with total +2 max (A) to pivot to chair. Pt needs pillows to sustain static sitting in chair safely with posey belt restraint. Pt fixated on calling his dog "little girl" Recommendation SNF at this time.    Recommendations for follow up therapy are one component of a multi-disciplinary discharge planning process, led by the attending physician.  Recommendations may be updated based on patient status, additional functional criteria and insurance authorization.    Follow Up Recommendations  SNF    Equipment Recommendations  Wheelchair (measurements OT);Wheelchair cushion (measurements OT)    Recommendations for Other Services      Precautions / Restrictions Precautions Precautions: Fall Precaution Comments: condom cath       Mobility Bed Mobility Overal bed mobility: Needs Assistance Bed Mobility: Rolling;Supine to Sit Rolling: Max assist   Supine to sit: Total assist;Max assist     General bed mobility comments: exiting on the R side. pt needs (A) for R LE to eob. pt pushing up and needs (A) to coordinate movement    Transfers                      Balance Overall balance assessment: Needs assistance Sitting-balance support: Bilateral upper extremity supported;Feet supported Sitting balance-Leahy Scale: Poor     Standing balance support: Bilateral upper extremity supported;During functional activity Standing balance-Leahy Scale: Zero                             ADL either  performed or assessed with clinical judgement   ADL Overall ADL's : Needs assistance/impaired Eating/Feeding: Minimal assistance;Sitting Eating/Feeding Details (indicate cue type and reason): eating with spoon with  bil UE use. pt decreased visual attention  and near spillage of container Grooming: Wash/dry face;Moderate assistance   Upper Body Bathing: Moderate assistance   Lower Body Bathing: Maximal assistance   Upper Body Dressing : Moderate assistance   Lower Body Dressing: Maximal assistance Lower Body Dressing Details (indicate cue type and reason): pt lifting leg in response to visual display of socks. pt takes "this goes hereArboriculturist: +2 for physical assistance;Maximal assistance;Cueing for safety;Stand-pivot             General ADL Comments: pt eager to get up. pt kissing therapists hands at times during sesison.pt liable during session and when asked why states "because i am not suppose"     Vision   Vision Assessment?: Vision impaired- to be further tested in functional context Additional Comments: pt using central vision. pt visually attending more to the R side. pt able to track with max cues to the L   Perception     Praxis      Cognition Arousal/Alertness: Awake/alert Behavior During Therapy: Impulsive;Restless Overall Cognitive Status: Impaired/Different from baseline Area of Impairment: Orientation;Attention;Memory;Following commands;Safety/judgement;Awareness;Problem solving;JFK Recovery Scale               Rancho Levels of Cognitive Functioning Rancho Los Amigos Scales of Cognitive Functioning:  Confused/inappropriate/non-agitated Orientation Level: Disoriented to;Situation;Time;Place Current Attention Level: Sustained Memory: Decreased recall of precautions;Decreased short-term memory Following Commands: Follows one step commands inconsistently Safety/Judgement: Decreased awareness of safety;Decreased awareness of deficits Awareness:  Intellectual Problem Solving: Decreased initiation;Difficulty sequencing;Requires verbal cues;Requires tactile cues;Slow processing General Comments: pt at times talking about topics not related. pt calling and whistles for "little girl" his dog. Pt requesting his dog to come. Pt reading "[facility]" but not able to immediately able to verbalize information back to therapist        Exercises     Shoulder Instructions       General Comments VSS    Pertinent Vitals/ Pain       Pain Assessment: No/denies pain  Home Living Family/patient expects to be discharged to:: Unsure                                        Prior Functioning/Environment              Frequency  Min 2X/week        Progress Toward Goals  OT Goals(current goals can now be found in the care plan section)  Progress towards OT goals: Progressing toward goals  Acute Rehab OT Goals Patient Stated Goal: little girl come!  Plan Discharge plan remains appropriate    Co-evaluation    PT/OT/SLP Co-Evaluation/Treatment: Yes Reason for Co-Treatment: Complexity of the patient's impairments (multi-system involvement);Necessary to address cognition/behavior during functional activity;To address functional/ADL transfers;For patient/therapist safety   OT goals addressed during session: ADL's and self-care;Proper use of Adaptive equipment and DME;Strengthening/ROM      AM-PAC OT "6 Clicks" Daily Activity     Outcome Measure   Help from another person eating meals?: A Little Help from another person taking care of personal grooming?: A Lot Help from another person toileting, which includes using toliet, bedpan, or urinal?: A Lot Help from another person bathing (including washing, rinsing, drying)?: A Lot Help from another person to put on and taking off regular upper body clothing?: A Lot Help from another person to put on and taking off regular lower body clothing?: A Lot 6 Click  Score: 13    End of Session Equipment Utilized During Treatment: Gait belt  OT Visit Diagnosis: Unsteadiness on feet (R26.81);Muscle weakness (generalized) (M62.81);Pain   Activity Tolerance Patient tolerated treatment well   Patient Left in chair;with call bell/phone within reach;with chair alarm set;with restraints reapplied   Nurse Communication Mobility status;Precautions        Time: 3151-7616 OT Time Calculation (min): 16 min  Charges: OT General Charges $OT Visit: 1 Visit OT Evaluation $OT Re-eval: 1 Re-eval   Brynn, OTR/L  Acute Rehabilitation Services Pager: [phone] Office: [phone] .   Jeri Modena 07/10/2021, 11:54 AM

## 2021-07-11 ENCOUNTER — Encounter (HOSPITAL_COMMUNITY): Admission: EM | Disposition: A | Discharge status: Skilled Nursing Facility | Payer: Self-pay | Source: Home / Self Care

## 2021-07-11 ENCOUNTER — Inpatient Hospital Stay (HOSPITAL_COMMUNITY): Payer: Medicare Other

## 2021-07-11 LAB — GLUCOSE, CAPILLARY
Glucose-Capillary: 97 mg/dL (ref 70–99)
Glucose-Capillary: 110 mg/dL — ABNORMAL HIGH (ref 70–99)
Glucose-Capillary: 147 mg/dL — ABNORMAL HIGH (ref 70–99)
Glucose-Capillary: 159 mg/dL — ABNORMAL HIGH (ref 70–99)
Glucose-Capillary: 112 mg/dL — ABNORMAL HIGH (ref 70–99)
Glucose-Capillary: 131 mg/dL — ABNORMAL HIGH (ref 70–99)

## 2021-07-11 SURGERY — CANCELLED PROCEDURE

## 2021-07-11 MED ORDER — FOLIC ACID 1 MG PO TABS
1.0000 mg | ORAL_TABLET | Freq: Every day | ORAL | Status: DC
  Administered 2021-07-12 – 2021-07-30 (×19): 1 mg via ORAL
  Filled 2021-07-11 (×19): qty 1

## 2021-07-11 MED ORDER — ADULT MULTIVITAMIN W/MINERALS CH
1.0000 | ORAL_TABLET | Freq: Every day | ORAL | Status: DC
  Administered 2021-07-12 – 2021-07-30 (×19): 1 via ORAL
  Filled 2021-07-11 (×19): qty 1

## 2021-07-11 MED ORDER — ACETAMINOPHEN 500 MG PO TABS
1000.0000 mg | ORAL_TABLET | Freq: Four times a day (QID) | ORAL | Status: DC
  Administered 2021-07-11 – 2021-07-30 (×55): 1000 mg via ORAL
  Filled 2021-07-11 (×58): qty 2

## 2021-07-11 MED ORDER — BETHANECHOL CHLORIDE 25 MG PO TABS
25.0000 mg | ORAL_TABLET | Freq: Three times a day (TID) | ORAL | Status: DC
  Administered 2021-07-11 – 2021-07-14 (×11): 25 mg via ORAL
  Filled 2021-07-11 (×12): qty 1

## 2021-07-11 MED ORDER — THIAMINE HCL 100 MG PO TABS
100.0000 mg | ORAL_TABLET | Freq: Every day | ORAL | Status: DC
  Administered 2021-07-12 – 2021-07-30 (×19): 100 mg via ORAL
  Filled 2021-07-11 (×19): qty 1

## 2021-07-11 MED ORDER — GUAIFENESIN 100 MG/5ML PO SOLN
20.0000 mL | ORAL | Status: DC | PRN
  Filled 2021-07-11: qty 10

## 2021-07-11 MED ORDER — OXYCODONE HCL 5 MG PO TABS
5.0000 mg | ORAL_TABLET | ORAL | Status: DC | PRN
  Administered 2021-07-12 – 2021-07-16 (×8): 10 mg via ORAL
  Filled 2021-07-11 (×8): qty 2

## 2021-07-11 MED ORDER — METHOCARBAMOL 500 MG PO TABS
1000.0000 mg | ORAL_TABLET | Freq: Three times a day (TID) | ORAL | Status: DC | PRN
  Administered 2021-07-14: 1000 mg via ORAL
  Filled 2021-07-11: qty 2

## 2021-07-11 NOTE — Progress Notes (Signed)
Trauma/Critical Care Follow Up Note  Subjective:    Overnight Issues:  Much more awake and alert today.  Oriented to self.  Initially guessed 2002 for year, but remembered 2022 when I told him and he repeated it over 5 minutes later to MD.  Knows his son's names, etc.  No new complaints. Objective:  Vital signs for last 24 hours: Temp:  [97.6 F (36.4 C)-98.3 F (36.8 C)] 98.3 F (36.8 C) (10/13 0815) Pulse Rate:  [68-98] 86 (10/13 0815) Resp:  [16-18] 16 (10/13 0815) BP: (122-155)/(71-88) 134/84 (10/13 0815) SpO2:  [94 %-98 %] 95 % (10/13 0815) Weight:  [70.8 kg] 70.8 kg (10/13 0500)    Intake/Output from previous day: 10/12 0701 - 10/13 0700 In: -  Out: 600 [Urine:600]  Intake/Output this shift: No intake/output data recorded.  Physical Exam:  Gen: comfortable, no distress, calm Neuro: more interactive and awake today, follows commands HEENT: right pupil slightly larger than left pupil but between 4-109mm in size Neck: supple CV: RRR Pulm: unlabored breathing Abd: soft, NT GU: clear yellow urine Extr: wwp, no edema, wrists in soft restraints   Results for orders placed or performed during the hospital encounter of 06/15/21 (from the past 24 hour(s))  Glucose, capillary     Status: Abnormal   Collection Time: 07/10/21 12:03 PM  Result Value Ref Range   Glucose-Capillary 122 (H) 70 - 99 mg/dL  Glucose, capillary     Status: Abnormal   Collection Time: 07/10/21  3:27 PM  Result Value Ref Range   Glucose-Capillary 105 (H) 70 - 99 mg/dL  Glucose, capillary     Status: Abnormal   Collection Time: 07/10/21  8:20 PM  Result Value Ref Range   Glucose-Capillary 100 (H) 70 - 99 mg/dL  Glucose, capillary     Status: None   Collection Time: 07/10/21 11:33 PM  Result Value Ref Range   Glucose-Capillary 92 70 - 99 mg/dL  Glucose, capillary     Status: None   Collection Time: 07/11/21  3:55 AM  Result Value Ref Range   Glucose-Capillary 97 70 - 99 mg/dL  Glucose,  capillary     Status: Abnormal   Collection Time: 07/11/21  8:14 AM  Result Value Ref Range   Glucose-Capillary 110 (H) 70 - 99 mg/dL    Assessment & Plan: The plan of care was discussed with the bedside nurse for the day, who is in agreement with this plan and no additional concerns were raised.   Present on Admission:  Skull fracture ([place])    LOS: 26 days   Found down   SAH, IVH, parenchymal contusion - NSGY c/s, Dr. Kathyrn Sheriff, repeat [facility] 9/18 w/ progression of SAH. F/U CT H evolving contusions and decreased SAH, small B hygromas. Continue TBI team therapies..  Skull fracture - NSGY c/s, Dr. Kathyrn Sheriff Nondisplaced facial fractures - ENT c/s, Dr. Claudia Desanctis, nonop, head of bed elevation to help with swelling, follow up outpatient Alcohol abuse - 186 on admission. CIWA, NH4 WNL VDRF - tolerated extubation 10/4 Generalized lymphadenopthy - will need outpatient W/U ?CLL  ID - resp cx 9/23 with S pneumo, completed 7d course. Resp cx 10/7 with Enterbacter aerogenes, another 7d course of cefepime, ucx negative 10/8 Dysphagia - will hold on PEG placement today as patient is much more awake and alert today.  Discussed with MD and SLP.  Will plan MBS today at 10:30.  If doesn't do well then can reschedule PEG prn FEN - NPO, MBS today  VTE - SCDs, Lovenox Dispo - 4NP, PT/OT, likely need SNF    Henreitta Cea, PA-C Trauma & General Surgery Please use [email or web address] to contact on call provider  07/11/2021

## 2021-07-11 NOTE — Progress Notes (Signed)
Modified Barium Swallow Progress Note  Patient Details  Name: Randall Harvey. MRN: [id number] Date of Birth: 02-17-1961  Today's Date: 07/11/2021  Modified Barium Swallow completed.  Full report located under Chart Review in the Imaging Section.  Brief recommendations include the following:  Clinical Impression  Pt was seen for an MBS. Overall, pt presents with mild oropharyngeal dysphagia; oral phase impairments c/b prolonged/impaired mastication with regular solids and pharyngeal phase impairments c/b penetration above the level of the vocal folds (PAS 2 x1/PAS 3 x2) from cup and straw and aspiration not ejected despite attempt to clear (PAS 7 x1) with thin liquids from straw.  Penetration/aspiration d/t timing of epiglottic inversion deficit during consecutive swallows; during second swallow from consecutive large sips, thin liquid from pyriform sinuses spilled into airway with delayed and diminished attempt to clear. Small sips of thin liquid intact, however pt cognitive impairments will prevent implementation of small sip technique. Small and large/consecutive sips of nectar thick liquid from straw WFL. Minimal vallecular residue across consistencies. Recommend Dys3/nectar thick liquid diet d/t decreased mastication efficiency and increased swallow safety using thickened liquids. Pt will require full supervision to implement small sips/bites strategy. Administer medications crushed in puree. SLP will continue to follow acutely for upgraded PO trials and implementation of compensatory strategies.   Swallow Evaluation Recommendations       SLP Diet Recommendations: Dysphagia 3 (Mech soft) solids;Nectar thick liquid   Liquid Administration via: Straw   Medication Administration: Crushed with puree   Supervision: Full assist for feeding;Full supervision/cueing for compensatory strategies   Compensations: Slow rate;Minimize environmental distractions;Small sips/bites;Other (Comment) (Do not  allow pt to chug/shovel drink/food. Pinch straw to prevent chugging.)   Postural Changes: Remain semi-upright after after feeds/meals (Comment);Seated upright at 90 degrees   Oral Care Recommendations: Oral care BID   Other Recommendations: Order thickener from pharmacy    [name] 07/11/2021,3:11 PM

## 2021-07-11 NOTE — TOC Progression Note (Signed)
Transition of Care (TOC) - Progression Note    Patient Details  Name: Randall Harvey. MRN: [id number] Date of Birth: Mar 01, 1961  Transition of Care [facility]) CM/SW Contact  Ella Bodo, RN Phone Number: 07/11/2021, 4:51 PM  Clinical Narrative:    FL-2 updated to reflect new diet order.  Refaxed patient information out for SNF placement; please note patient will need to be out of restraints 24 hours prior to discharge to SNF.  We will follow-up with family on bed offers when available.   Expected Discharge Plan: [place] Barriers to Discharge: Continued Medical Work up  Expected Discharge Plan and Services Expected Discharge Plan: [place]   Discharge Planning Services: CM Consult   Living arrangements for the past 2 months: Single Family Home                                       Social Determinants of Health (SDOH) Interventions    Readmission Risk Interventions No flowsheet data found.   ,Reinaldo Raddle, RN, BSN  Trauma/Neuro ICU Case Manager [phone]

## 2021-07-11 NOTE — Progress Notes (Signed)
Called patients son and he said he will visit him Monday. Patient is still trying to get out of bed and needing haldol and ativan

## 2021-07-11 NOTE — Progress Notes (Signed)
Physical Therapy Treatment Patient Details Name: Randall Harvey. MRN: [id number] DOB: 09-29-1961 Today's Date: 07/11/2021   History of Present Illness Pt is 60 yo male found down and inebriated with blood from L ear. Found to have L SAH and L temporal bone, L sphenoid, L maxillary fxs. Intubated 9/22, PT signed off and was reordered. PMH: COPD, HTN, PE, renal cell carcinoma, CVA, thrombocytopenia.    PT Comments    Patient progressing with mobility this session able to stand with +2 mod A and maintain long enough for changing brief.  Patient unable to take a step due to pain in L LE.  Patient unsafe for OOB due to pulling on lines/leads and impulsive in getting up.  Feel he will continue to progress with mobility with skilled PT in the acute setting and will need follow up SNF level rehab at d/c.     Recommendations for follow up therapy are one component of a multi-disciplinary discharge planning process, led by the attending physician.  Recommendations may be updated based on patient status, additional functional criteria and insurance authorization.  Follow Up Recommendations  SNF;Supervision/Assistance - 24 hour     Equipment Recommendations  Other (comment) (TBD)    Recommendations for Other Services       Precautions / Restrictions Precautions Precautions: Fall Precaution Comments: restraints     Mobility  Bed Mobility Overal bed mobility: Needs Assistance Bed Mobility: Supine to Sit Rolling: Mod assist;+2 for safety/equipment   Supine to sit: Mod assist;+2 for safety/equipment     General bed mobility comments: on R side, assist to lift trunk, pt impulsive; to supine assist for legs and trunk; noted nystagmus L beating upon returning to supine lasting about 15 seconds with pt c/o dizziness    Transfers Overall transfer level: Needs assistance Equipment used: 2 person hand held assist Transfers: Sit to/from Stand Sit to Stand: Mod assist;+2 safety/equipment          General transfer comment: eager to stand and stood x 2 after L foot placed in proper position in sitting; sat with uncontrolled descent, then stood second time from EOB; deferred OOB for pt safety  Ambulation/Gait Ambulation/Gait assistance: Mod assist;+2 physical assistance Gait Distance (Feet): 1 Feet Assistive device: 2 person hand held assist       General Gait Details: took step forward with R and L LE buckled and pt yelped in pain so assisted to sit on EOB   Stairs             Wheelchair Mobility    Modified Rankin (Stroke Patients Only)       Balance Overall balance assessment: Needs assistance Sitting-balance support: Feet supported Sitting balance-Leahy Scale: Poor Sitting balance - Comments: falling to L uncontrolled min to mod assist to prevent LOB   Standing balance support: Bilateral upper extremity supported Standing balance-Leahy Scale: Poor Standing balance comment: UE support for standing                            Cognition Arousal/Alertness: Awake/alert Behavior During Therapy: Impulsive;Restless Overall Cognitive Status: Impaired/Different from baseline Area of Impairment: Orientation;Attention;Memory;Following commands;Safety/judgement;Problem solving;Awareness;Rancho level               Rancho Levels of Cognitive Functioning [place] [employer] Scales of Cognitive Functioning: Confused/inappropriate/non-agitated Orientation Level: Disoriented to;Place;Time;Situation Current Attention Level: Focused Memory: Decreased short-term memory;Decreased recall of precautions Following Commands: Follows one step commands inconsistently Safety/Judgement: Decreased awareness of safety;Decreased  awareness of deficits Awareness: Intellectual Problem Solving: Slow processing;Requires verbal cues;Requires tactile cues General Comments: focused on removing ECG leads despite efforts at redirection, eager to get up asking to put rail down,  unaware of LOB to R in sitting and needing constant redirection for safety      Exercises      General Comments General comments (skin integrity, edema, etc.): VSS      Pertinent Vitals/Pain Pain Assessment: Faces Faces Pain Scale: Hurts even more Pain Location: L LE with weight bearing Pain Descriptors / Indicators: Grimacing;Moaning Pain Intervention(s): Monitored during session;Repositioned;Limited activity within patient's tolerance    Home Living                      Prior Function            PT Goals (current goals can now be found in the care plan section) Progress towards PT goals: Progressing toward goals    Frequency    Min 3X/week      PT Plan Current plan remains appropriate    Co-evaluation              AM-PAC PT "6 Clicks" Mobility   Outcome Measure  Help needed turning from your back to your side while in a flat bed without using bedrails?: A Lot Help needed moving from lying on your back to sitting on the side of a flat bed without using bedrails?: A Lot Help needed moving to and from a bed to a chair (including a wheelchair)?: A Lot Help needed standing up from a chair using your arms (e.g., wheelchair or bedside chair)?: A Lot Help needed to walk in hospital room?: Total Help needed climbing 3-5 steps with a railing? : Total 6 Click Score: 10    End of Session   Activity Tolerance: Patient limited by pain Patient left: in bed;with call bell/phone within reach;with bed alarm set;with restraints reapplied   PT Visit Diagnosis: Other abnormalities of gait and mobility (R26.89);Pain Pain - Right/Left: Left Pain - part of body: Knee     Time: 4680-3212 PT Time Calculation (min) (ACUTE ONLY): 29 min  Charges:  $Therapeutic Activity: 23-37 mins                     Magda Kiel, PT Acute Rehabilitation Services YQMGN:[phone] Office:[phone] 07/11/2021    Randall Harvey 07/11/2021, 5:22 PM

## 2021-07-11 NOTE — NC FL2 (Addendum)
[place] LEVEL OF CARE SCREENING TOOL     IDENTIFICATION  Patient Name: Randall Harvey. Birthdate: Oct 11, 1960 Sex: male Admission Date (Current Location): 06/15/2021  [facility] and [place] Number:  Herbalist and Address:  [facility], [place] [address] [place], [place] [address]      Provider Number: [id number]  Attending Physician Name and Address:  Md, Trauma, MD  Relative Name and Phone Number:       Current Level of Care: Hospital Recommended Level of Care: [place] Prior Approval Number:    Date Approved/Denied:   PASRR Number: [id number]  Discharge Plan: SNF    Current Diagnoses: Patient Active Problem List   Diagnosis Date Noted   Malnutrition of moderate degree 06/20/2021   Skull fracture ([place]) 06/15/2021    Orientation RESPIRATION BLADDER Height & Weight     Self  Normal Incontinent Weight: 70.8 kg Height:  5\' 9"  (175.3 cm)  BEHAVIORAL SYMPTOMS/MOOD NEUROLOGICAL BOWEL NUTRITION STATUS      Continent Diet (Dysphagia 3 (Mech soft) solids;Nectar thick liquid)  AMBULATORY STATUS COMMUNICATION OF NEEDS Skin   Limited Assist Verbally Normal                       Personal Care Assistance Level of Assistance  Bathing, Feeding, Dressing Bathing Assistance: Limited assistance Feeding assistance: Limited assistance Dressing Assistance: Limited assistance     Functional Limitations Info             [place]  PT (By licensed PT), OT (By licensed OT), Speech therapy     PT Frequency: 5x weekly OT Frequency: 5x weekly     Speech Therapy Frequency: 5x weekly      Contractures Contractures Info: Not present    Additional Factors Info  Code Status, Allergies Code Status Info: Full Allergies Info: NKDA           Current Medications (07/11/2021):  This is the current hospital active medication list Current Facility-Administered Medications  Medication Dose  Route Frequency Provider Last Rate Last Admin   0.9 %  sodium chloride infusion   Intravenous PRN Georganna Skeans, MD 10 mL/hr at 07/08/21 1746 Infusion Verify at 07/08/21 1746   acetaminophen (TYLENOL) tablet 1,000 mg  1,000 mg Oral Q6H Saverio Danker, PA-C       bethanechol (URECHOLINE) tablet 25 mg  25 mg Oral TID Saverio Danker, PA-C       ceFEPIme (MAXIPIME) 2 g in sodium chloride 0.9 % 100 mL IVPB  2 g Intravenous Q8H Jesusita Oka, MD 200 mL/hr at 07/11/21 1257 New Bag at 07/11/21 1257   Chlorhexidine Gluconate Cloth 2 % PADS 6 each  6 each Topical Daily Jesusita Oka, MD   6 each at 07/11/21 1220   enoxaparin (LOVENOX) injection 30 mg  30 mg Subcutaneous Q12H Jesusita Oka, MD   30 mg at 07/11/21 1100   [START ON 76/19/5093] folic acid (FOLVITE) tablet 1 mg  1 mg Oral Daily Saverio Danker, PA-C       guaiFENesin (ROBITUSSIN) 100 MG/5ML solution 400 mg  20 mL Oral Q4H PRN Saverio Danker, PA-C       haloperidol lactate (HALDOL) injection 5 mg  5 mg Intramuscular Q6H PRN Jesusita Oka, MD   5 mg at 07/10/21 2671   Or   haloperidol lactate (HALDOL) injection 5 mg  5 mg Intravenous Q6H PRN Jesusita Oka, MD  5 mg at 07/11/21 1303   insulin aspart (novoLOG) injection 0-20 Units  0-20 Units Subcutaneous Q4H Georganna Skeans, MD   3 Units at 07/11/21 1248   LORazepam (ATIVAN) injection 0.5-1 mg  0.5-1 mg Intravenous Q4H PRN Jesusita Oka, MD   1 mg at 07/10/21 0920   MEDLINE mouth rinse  15 mL Mouth Rinse BID Georganna Skeans, MD   15 mL at 07/11/21 1000   methocarbamol (ROBAXIN) tablet 1,000 mg  1,000 mg Oral Q8H PRN Saverio Danker, PA-C       [START ON 07/12/2021] multivitamin with minerals tablet 1 tablet  1 tablet Oral Daily Saverio Danker, PA-C       ondansetron [facility]) injection 4 mg  4 mg Intravenous Q6H PRN Stechschulte, Nickola Major, MD   4 mg at 06/16/21 0359   oxyCODONE (Oxy IR/ROXICODONE) immediate release tablet 5-10 mg  5-10 mg Oral Q4H PRN Saverio Danker, PA-C        [START ON 07/12/2021] thiamine tablet 100 mg  100 mg Oral Daily Saverio Danker, PA-C         Discharge Medications: Please see discharge summary for a list of discharge medications.  Reinaldo Raddle, RN, BSN  Trauma/Neuro ICU Case Manager [phone]   Relevant Imaging Results:  Relevant Lab Results:   Additional Information SSN: [id number]  Reinaldo Raddle, RN, BSN  Trauma/Neuro ICU Case Manager [phone]

## 2021-07-12 LAB — GLUCOSE, CAPILLARY
Glucose-Capillary: 106 mg/dL — ABNORMAL HIGH (ref 70–99)
Glucose-Capillary: 150 mg/dL — ABNORMAL HIGH (ref 70–99)

## 2021-07-12 NOTE — Progress Notes (Signed)
Occupational Therapy Treatment Patient Details Name: Randall Harvey. MRN: [id number] DOB: [birth year] Today's Date: 07/12/2021   History of present illness Pt is 60 yo male found down and inebriated with blood from L ear. Found to have L SAH and L temporal bone, L sphenoid, L maxillary fxs. Intubated 9/22, PT signed off and was reordered. PMH: COPD, HTN, PE, renal cell carcinoma, CVA, thrombocytopenia.   OT comments  Pt showing good progression toward goals this session with a toilet transfer + for stool. Pt initiates hygiene reaching for toilet paper.Pt requires total +2 max (A) for transfer. Recommendation for SNF at this time. If family could provide increased care at home could increase recommendation to CIR.    Recommendations for follow up therapy are one component of a multi-disciplinary discharge planning process, led by the attending physician.  Recommendations may be updated based on patient status, additional functional criteria and insurance authorization.    Follow Up Recommendations  SNF    Equipment Recommendations  Wheelchair (measurements OT);Wheelchair cushion (measurements OT)    Recommendations for Other Services Speech consult    Precautions / Restrictions Precautions Precautions: Fall Precaution Comments: restraints       Mobility Bed Mobility Overal bed mobility: Needs Assistance Bed Mobility: Supine to Sit Rolling: Mod assist Sidelying to sit: Mod assist       General bed mobility comments: requires (A) to elevate trunk from bed surface and sustain static sitting    Transfers Overall transfer level: Needs assistance Equipment used: 2 person hand held assist Transfers: Sit to/from Stand Sit to Stand: Max assist;+2 physical assistance;+2 safety/equipment Stand pivot transfers: Max assist;+2 physical assistance;+2 safety/equipment       General transfer comment: pt not stepping toward chair or toilet. pt keeping feet in a narrowed base of  support.    Balance Overall balance assessment: Needs assistance Sitting-balance support: Bilateral upper extremity supported;Feet supported Sitting balance-Leahy Scale: Poor     Standing balance support: Bilateral upper extremity supported;During functional activity Standing balance-Leahy Scale: Poor                             ADL either performed or assessed with clinical judgement   ADL Overall ADL's : Needs assistance/impaired Eating/Feeding: Moderate assistance;Sitting Eating/Feeding Details (indicate cue type and reason): hand over hand to control speed of drink Grooming: Wash/dry face;Moderate assistance               Lower Body Dressing: Maximal assistance Lower Body Dressing Details (indicate cue type and reason): figure 4 to don socks in backward chaining attempt Toilet Transfer: Maximal assistance;+2 for physical assistance;+2 for safety/equipment;Stand-pivot;Regular Toilet;Grab bars Toilet Transfer Details (indicate cue type and reason): pt on first attempt sustains grasp on grab bars and needs hand over hand to release for safe transfer. pt sitting attempting ot stand and pull down underwear that are not present Toileting- Clothing Manipulation and Hygiene: Total assistance Toileting - Clothing Manipulation Details (indicate cue type and reason): static standing total +3 (A)  with total (A) for peri care       General ADL Comments: transfered from bed to chair <>chair to toilet and back to chair. restraints applied in chair     Vision       Perception     Praxis      Cognition Arousal/Alertness: Awake/alert Behavior During Therapy: Impulsive;Restless Overall Cognitive Status: Impaired/Different from baseline Area of Impairment: Orientation;Attention;Memory;Following commands;Safety/judgement;Problem solving;Awareness;Rancho level  Rancho Levels of Cognitive Functioning [place] [employer] Scales of Cognitive Functioning:  Confused/inappropriate/non-agitated Orientation Level: Disoriented to;Place;Time;Situation Current Attention Level: Focused Memory: Decreased short-term memory;Decreased recall of precautions Following Commands: Follows one step commands inconsistently Safety/Judgement: Decreased awareness of safety;Decreased awareness of deficits Awareness: Intellectual Problem Solving: Slow processing;Requires verbal cues;Requires tactile cues General Comments: pt did void bowel on toilet appropriately and reaching for toilet paper appropriately upon conclusion. pt does reach for tissue to blow nose. pt does not recognize the LOB by attempting this task and fall risk to himself. Pt unable to answer whom named people on flowers in room are. pt does follow commands for hair hygiene this session. pt demonstrates expressive language deficits but remains engaged in task. pt attempting to kiss hands and leg of staff in an appreciative way during session but without permission or cues its about to occur.        Exercises     Shoulder Instructions       General Comments VSS    Pertinent Vitals/ Pain       Pain Assessment: No/denies pain  Home Living                                          Prior Functioning/Environment              Frequency  Min 2X/week        Progress Toward Goals  OT Goals(current goals can now be found in the care plan section)  Progress towards OT goals: Progressing toward goals  Acute Rehab OT Goals Patient Stated Goal: none directly stated. pt in agreement to having hair combed out and washed in chair. pt states "thank you". OT states sorry we got your gown wet but we changed it. Pt states "you cant help that" ADL Goals Pt Will Perform Grooming: with min guard assist;sitting Pt Will Perform Upper Body Bathing: with min assist;sitting Pt Will Transfer to Toilet: with mod assist;ambulating;bedside commode Additional ADL Goal #1: pt will follow 2 step  command 50% of session  Plan Discharge plan remains appropriate    Co-evaluation                 AM-PAC OT "6 Clicks" Daily Activity     Outcome Measure   Help from another person eating meals?: A Little Help from another person taking care of personal grooming?: A Lot Help from another person toileting, which includes using toliet, bedpan, or urinal?: A Lot Help from another person bathing (including washing, rinsing, drying)?: A Lot Help from another person to put on and taking off regular upper body clothing?: A Lot Help from another person to put on and taking off regular lower body clothing?: A Lot 6 Click Score: 13    End of Session Equipment Utilized During Treatment: Gait belt  OT Visit Diagnosis: Unsteadiness on feet (R26.81);Muscle weakness (generalized) (M62.81);Pain   Activity Tolerance Patient tolerated treatment well   Patient Left in chair;with call bell/phone within reach;with chair alarm set;with restraints reapplied   Nurse Communication Mobility status;Precautions        Time: 6270-3500 OT Time Calculation (min): 49 min  Charges: OT General Charges $OT Visit: 1 Visit OT Treatments $Self Care/Home Management : 38-52 mins   [place], OTR/L  Acute Rehabilitation Services Pager: [phone] Office: [phone] .   Jeri Modena 07/12/2021, 2:35 PM

## 2021-07-12 NOTE — Progress Notes (Signed)
   Trauma/Critical Care Follow Up Note  Subjective:    Overnight Issues:  Sleeping as he recently received ativan as he was trying to get out of bed overnight.  Passed for D3 diet yesterday Objective:  Vital signs for last 24 hours: Temp:  [97.5 F (36.4 C)-98.4 F (36.9 C)] 97.5 F (36.4 C) (10/14 0741) Pulse Rate:  [64-99] 64 (10/14 0741) Resp:  [10-18] 10 (10/14 0741) BP: (101-176)/(62-100) 144/65 (10/14 0741) SpO2:  [92 %-100 %] 100 % (10/14 0741) Weight:  [69.1 kg] 69.1 kg (10/14 0500)    Intake/Output from previous day: 10/13 0701 - 10/14 0700 In: 720 [P.O.:720] Out: 500 [Urine:500]  Intake/Output this shift: No intake/output data recorded.  Physical Exam:  Gen: sleeping Neuro: sleeping HEENT: no masses or lesions Neck: supple CV: RRR Pulm: unlabored breathing Abd: soft, NT GU: clear yellow urine Extr: wwp, no edema, wrists in soft restraints and mittens, posey in place   Results for orders placed or performed during the hospital encounter of 06/15/21 (from the past 24 hour(s))  Glucose, capillary     Status: Abnormal   Collection Time: 07/11/21 11:13 AM  Result Value Ref Range   Glucose-Capillary 147 (H) 70 - 99 mg/dL  Glucose, capillary     Status: Abnormal   Collection Time: 07/11/21  3:35 PM  Result Value Ref Range   Glucose-Capillary 159 (H) 70 - 99 mg/dL  Glucose, capillary     Status: Abnormal   Collection Time: 07/11/21  4:47 PM  Result Value Ref Range   Glucose-Capillary 112 (H) 70 - 99 mg/dL  Glucose, capillary     Status: Abnormal   Collection Time: 07/11/21  7:26 PM  Result Value Ref Range   Glucose-Capillary 131 (H) 70 - 99 mg/dL  Glucose, capillary     Status: Abnormal   Collection Time: 07/12/21 12:09 AM  Result Value Ref Range   Glucose-Capillary 150 (H) 70 - 99 mg/dL  Glucose, capillary     Status: Abnormal   Collection Time: 07/12/21  3:20 AM  Result Value Ref Range   Glucose-Capillary 106 (H) 70 - 99 mg/dL    Assessment &  Plan: The plan of care was discussed with the bedside nurse for the day, who is in agreement with this plan and no additional concerns were raised.   Present on Admission:  Skull fracture ([place])    LOS: 27 days   Found down   SAH, IVH, parenchymal contusion - NSGY c/s, Dr. Kathyrn Sheriff, repeat [facility] 9/18 w/ progression of SAH. F/U CT H evolving contusions and decreased SAH, small B hygromas. Continue TBI team therapies.  Klonopin and seroquel DC.  Will see how he continues, but may need to add low dose back if having behavior issues at night  Skull fracture - NSGY c/s, Dr. Kathyrn Sheriff Nondisplaced facial fractures - ENT c/s, Dr. Claudia Desanctis, nonop, head of bed elevation to help with swelling, follow up outpatient Alcohol abuse - 186 on admission. CIWA, NH4 WNL VDRF - tolerated extubation 10/4 Generalized lymphadenopthy - will need outpatient W/U ?CLL  ID - resp cx 9/23 with S pneumo, completed 7d course. Resp cx 10/7 with Enterbacter aerogenes, another 7d course of cefepime, ucx negative 10/8 Dysphagia - improved.  Passed for D3 diet FEN - D3 diet VTE - SCDs, Lovenox Dispo - 4NP, PT/OT, likely need SNF    Henreitta Cea, PA-C Trauma & General Surgery Please use [email or web address] to contact on call provider  07/12/2021
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
## 2021-07-12 NOTE — Progress Notes (Addendum)
Speech Language Pathology Treatment: Dysphagia;Cognitive-Linquistic  Patient Details Name: Randall Harvey. MRN: [id number] DOB: 08/04/1961 Today's Date: 07/12/2021 Time: 4917-9150 SLP Time Calculation (min) (ACUTE ONLY): 24 min  Assessment / Plan / Recommendation Clinical Impression  Randall Harvey was alert and cooperative with girlfriend and sister present for therapy targeting cognition and dysphagia. Family educated re: MBS yesterday and recommendations.  He consumed nectar thick juice and graham cracker requiring hand over hand assist and tactile cues to remove straw from mouth after multiple verbal reminders were ineffective. Overall he tolerated with minimal throat clearing after larger sips. Min reminders to ensure oral cavity cleared. Continue Dys 3, nectar thick and full supervision given pt's impulsivity due to brain injury.   He was able to sustain attention to conversation re: his dog and independently looked at sign on wall for spatial orientation. He required max assist to recall reason for admission and length of stay. Exhibited behaviors in conjunction with Ranch[place] V   HPI HPI: Pt is 60 yo male found down and inebriated with blood from L ear. Found to have L SAH and L temporal bone, L sphenoid, L maxillary fxs. PMH: COPD, HTN, PE, renal cell carcinoma, CVA, thrombocytopenia. Intubated 9/22-10/4      SLP Plan  Continue with current plan of care      Recommendations for follow up therapy are one component of a multi-disciplinary discharge planning process, led by the attending physician.  Recommendations may be updated based on patient status, additional functional criteria and insurance authorization.    Recommendations  Diet recommendations: Dysphagia 3 (mechanical soft);Nectar-thick liquid Liquids provided via: Straw;Cup Medication Administration: Whole meds with puree Supervision: Staff to assist with self feeding;Full supervision/cueing for compensatory strategies Compensations:  Slow rate;Minimize environmental distractions;Small sips/bites;Other (Comment) Postural Changes and/or Swallow Maneuvers: Seated upright 90 degrees                Oral Care Recommendations: Oral care BID Follow up Recommendations: Inpatient Rehab SLP Visit Diagnosis: Dysphagia, oropharyngeal phase (R13.12);Cognitive communication deficit (V69.794) Plan: Continue with current plan of care       [place],  [place]  07/12/2021, 4:41 PM

## 2021-07-13 NOTE — Progress Notes (Signed)
   Trauma/Critical Care Follow Up Note  Subjective:    Overnight Issues:  No acute changes. Alert, resting comfortably.  Objective:  Vital signs for last 24 hours: Temp:  [97.8 F (36.6 C)-98.7 F (37.1 C)] 98.2 F (36.8 C) (10/15 0300) Pulse Rate:  [80-95] 88 (10/15 0300) Resp:  [12-20] 18 (10/15 0300) BP: (121-153)/(79-107) 121/88 (10/15 0300) SpO2:  [96 %-100 %] 100 % (10/14 1546) Weight:  [69 kg] 69 kg (10/15 0500)    Intake/Output from previous day: 10/14 0701 - 10/15 0700 In: 960 [P.O.:960] Out: 200 [Urine:200]  Intake/Output this shift: No intake/output data recorded.  Physical Exam:  Gen: NAD Neuro: alert, answers questions HEENT: no masses or lesions Neck: supple CV: RRR Pulm: unlabored breathing Abd: soft, NT GU: clear yellow urine Extr: wwp, no edema, wrists in soft restraints and mittens, posey in place   No results found for this or any previous visit (from the past 24 hour(s)).   Assessment & Plan:  Present on Admission:  Skull fracture ([place])    LOS: 28 days   Found down   SAH, IVH, parenchymal contusion - NSGY c/s, Dr. Kathyrn Sheriff, repeat [facility] 9/18 w/ progression of SAH. F/U CT H evolving contusions and decreased SAH, small B hygromas. Continue TBI team therapies.  Klonopin and seroquel DC.  PRN haldol and ativan for agitation, required a dose of ativan overnight. Skull fracture - NSGY c/s, Dr. Kathyrn Sheriff Nondisplaced facial fractures - ENT c/s, Dr. Claudia Desanctis, nonop, head of bed elevation to help with swelling, follow up outpatient Alcohol abuse - 186 on admission. CIWA, NH4 WNL VDRF - tolerated extubation 10/4 Generalized lymphadenopthy - will need outpatient W/U ?CLL  ID - resp cx 9/23 with S pneumo, completed 7d course. Resp cx 10/7 with Enterbacter aerogenes, another 7d course of cefepime completed, ucx negative 10/8 Dysphagia - improved.  On D3 diet. FEN - D3 diet VTE - SCDs, Lovenox Dispo - 4NP, PT/OT, likely need SNF    Dwan Bolt,  MD Trauma & General Surgery Please use [email or web address] to contact on call provider  07/13/2021

## 2021-07-14 NOTE — Progress Notes (Signed)
   Trauma/Critical Care Follow Up Note  Subjective:    Overnight Issues:  No acute changes.   Objective:  Vital signs for last 24 hours: Temp:  [97.7 F (36.5 C)-98.7 F (37.1 C)] 98.2 F (36.8 C) (10/16 0324) Pulse Rate:  [81-96] 81 (10/16 0324) Resp:  [17-20] 18 (10/16 0324) BP: (130-147)/(79-108) 144/92 (10/16 0324) SpO2:  [100 %] 100 % (10/16 0324) Weight:  [69.1 kg] 69.1 kg (10/16 0500)    Intake/Output from previous day: No intake/output data recorded.  Intake/Output this shift: No intake/output data recorded.  Physical Exam:  Gen: NAD Neuro: alert HEENT: no masses or lesions Neck: supple CV: RRR Pulm: unlabored breathing Abd: soft, NT GU: clear yellow urine Extr: wwp, no edema, wrists in soft restraints and mittens, posey in place   No results found for this or any previous visit (from the past 24 hour(s)).   Assessment & Plan:  Present on Admission:  Skull fracture ([place])    LOS: 29 days   Found down   SAH, IVH, parenchymal contusion - NSGY c/s, Dr. Kathyrn Sheriff, repeat [facility] 9/18 w/ progression of SAH. F/U CT H evolving contusions and decreased SAH, small B hygromas. Continue TBI team therapies.  Klonopin and seroquel DC.  PRN haldol and ativan for agitation. Skull fracture - NSGY c/s, Dr. Kathyrn Sheriff Nondisplaced facial fractures - ENT c/s, Dr. Claudia Desanctis, nonop, head of bed elevation to help with swelling, follow up outpatient Alcohol abuse - 186 on admission. CIWA, NH4 WNL Generalized lymphadenopthy - will need outpatient W/U ?CLL  ID - resp cx 9/23 with S pneumo, completed 7d course. Resp cx 10/7 with Enterbacter aerogenes, another 7d course of cefepime completed, ucx negative 10/8 Dysphagia - improved.  On D3 diet. FEN - D3 diet VTE - SCDs, Lovenox Dispo - 4NP, PT/OT, likely need SNF    Dwan Bolt, MD Trauma & General Surgery Please use [email or web address] to contact on call provider  07/14/2021

## 2021-07-14 NOTE — Plan of Care (Signed)
  Problem: Education: Goal: Knowledge of General Education information will improve Description: Including pain rating scale, medication(s)/side effects and non-pharmacologic comfort measures Outcome: Not Progressing   

## 2021-07-15 LAB — BASIC METABOLIC PANEL
Anion gap: 6 (ref 5–15)
BUN: 14 mg/dL (ref 8–23)
CO2: 25 mmol/L (ref 22–32)
Calcium: 9.1 mg/dL (ref 8.9–10.3)
Chloride: 104 mmol/L (ref 98–111)
Creatinine, Ser: 0.74 mg/dL (ref 0.61–1.24)
GFR, Estimated: >60 mL/min (ref >60)
Glucose, Bld: 102 mg/dL — ABNORMAL HIGH (ref 70–99)
Potassium: 4 mmol/L (ref 3.5–5.1)
Sodium: 135 mmol/L (ref 135–145)

## 2021-07-15 LAB — CBC
HCT: 45.5 % (ref 39.0–52.0)
Hemoglobin: 15 g/dL (ref 13.0–17.0)
MCH: 31.2 pg (ref 26.0–34.0)
MCHC: 33 g/dL (ref 30.0–36.0)
MCV: 94.6 fL (ref 80.0–100.0)
Platelets: 243 10*3/uL (ref 150–400)
RBC: 4.81 MIL/uL (ref 4.22–5.81)
RDW: 11.3 % — ABNORMAL LOW (ref 11.5–15.5)
WBC: 9.8 10*3/uL (ref 4.0–10.5)
nRBC: 0 % (ref 0.0–0.2)

## 2021-07-15 MED ORDER — QUETIAPINE FUMARATE 50 MG PO TABS
25.0000 mg | ORAL_TABLET | Freq: Every day | ORAL | Status: DC
  Administered 2021-07-15: 25 mg via ORAL
  Filled 2021-07-15: qty 1

## 2021-07-15 MED ORDER — CLONAZEPAM 0.25 MG PO TBDP
0.2500 mg | ORAL_TABLET | Freq: Two times a day (BID) | ORAL | Status: DC
  Administered 2021-07-15: 0.25 mg via ORAL
  Filled 2021-07-15: qty 1

## 2021-07-15 MED ORDER — BETHANECHOL CHLORIDE 10 MG PO TABS
10.0000 mg | ORAL_TABLET | Freq: Three times a day (TID) | ORAL | Status: DC
  Administered 2021-07-15 (×3): 10 mg via ORAL
  Filled 2021-07-15: qty 1

## 2021-07-15 MED ORDER — CLONAZEPAM 0.5 MG PO TABS: 0.2500 mg | ORAL_TABLET | Freq: Two times a day (BID) | ORAL | Status: DC
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
## 2021-07-15 NOTE — TOC Progression Note (Signed)
Transition of Care (TOC) - Progression Note    Patient Details  Name: Randall Harvey. MRN: [id number] Date of Birth: June 05, 1961  Transition of Care [facility]) CM/SW Contact  Oren Section Cleta Alberts, RN Phone Number: 07/15/2021, 4:26 PM  Clinical Narrative:    [facility] case manager continues to follow regarding need for skilled nursing facility placement.  Patient currently remains in restraints; noted fall this afternoon.  Patient has two bed offers, but must be out of restraints 24h prior to dc.  [facility] has no bed availability this week, and RN Case Manager has left message for [facility].  Will follow progress with removal of restraints.     Expected Discharge Plan: [place] Barriers to Discharge: Continued Medical Work up  Expected Discharge Plan and Services Expected Discharge Plan: [place]   Discharge Planning Services: CM Consult   Living arrangements for the past 2 months: Single Family Home                                       Social Determinants of Health (SDOH) Interventions    Readmission Risk Interventions No flowsheet data found.  Reinaldo Raddle, RN, BSN  Trauma/Neuro ICU Case Manager [phone]
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
## 2021-07-15 NOTE — Progress Notes (Signed)
Progress Note  4 Days Post-Op  Subjective: Patient reports mild headache this AM. He is calm but wants to get out of restraints. He is tolerating diet and voiding appropriately. No family at bedside this AM.   Objective: Vital signs in last 24 hours: Temp:  [97.6 F (36.4 C)-98.3 F (36.8 C)] 97.6 F (36.4 C) (10/17 0758) Pulse Rate:  [64-92] 64 (10/17 0758) Resp:  [11-18] 11 (10/17 0758) BP: (137-157)/(81-99) 150/81 (10/17 0758) SpO2:  [96 %-100 %] 97 % (10/17 0758) Weight:  [69.1 kg] 69.1 kg (10/17 0500) Last BM Date: 07/12/21  Intake/Output from previous day: 10/16 0701 - 10/17 0700 In: 660 [P.O.:660] Out: -  Intake/Output this shift: No intake/output data recorded.  PE: Gen: NAD Neuro: alert and appropriate, following commands HEENT: no masses or lesions Neck: supple CV: RRR Pulm: unlabored breathing Abd: soft, NT Extr: wwp, no edema, wrists in soft restraints and mittens, posey in place   Lab Results:  Recent Labs    07/15/21 0829  WBC 9.8  HGB 15.0  HCT 45.5  PLT 243   BMET No results for input(s): NA, K, CL, CO2, GLUCOSE, BUN, CREATININE, CALCIUM in the last 72 hours. PT/INR No results for input(s): LABPROT, INR in the last 72 hours. CMP     Component Value Date/Time   NA 140 07/05/2021 0251   K 4.2 07/05/2021 0251   CL 103 07/05/2021 0251   CO2 29 07/05/2021 0251   GLUCOSE 152 (H) 07/05/2021 0251   BUN 19 07/05/2021 0251   CREATININE 0.77 07/05/2021 0251   CALCIUM 9.3 07/05/2021 0251   PROT 6.5 06/15/2021 2240   ALBUMIN 3.6 06/15/2021 2240   AST 38 06/15/2021 2240   ALT 37 06/15/2021 2240   ALKPHOS 52 06/15/2021 2240   BILITOT 0.7 06/15/2021 2240   GFRNONAA >60 07/05/2021 0251   Lipase  No results found for: LIPASE     Studies/Results: No results found.  Anti-infectives: Anti-infectives (From admission, onward)    Start     Dose/Rate Route Frequency Ordered Stop   07/05/21 1200  ceFEPIme (MAXIPIME) 2 g in sodium chloride  0.9 % 100 mL IVPB        2 g 200 mL/hr over 30 Minutes Intravenous Every 8 hours 07/05/21 1052 07/11/21 1957   07/05/21 1130  ceFEPIme (MAXIPIME) 2 g in sodium chloride 0.9 % 100 mL IVPB  Status:  Discontinued        2 g 200 mL/hr over 30 Minutes Intravenous Every 12 hours 07/05/21 1042 07/05/21 1052   06/24/21 1800  ceFAZolin (ANCEF) IVPB 2g/100 mL premix        2 g 200 mL/hr over 30 Minutes Intravenous Every 8 hours 06/24/21 1116 06/28/21 1849   06/22/21 1100  ceFEPIme (MAXIPIME) 2 g in sodium chloride 0.9 % 100 mL IVPB  Status:  Discontinued        2 g 200 mL/hr over 30 Minutes Intravenous Every 8 hours 06/22/21 0957 06/24/21 1116        Assessment/Plan Found down SAH, IVH, parenchymal contusion - NSGY c/s, Dr. Kathyrn Sheriff, repeat [facility] 9/18 w/ progression of SAH. F/U CT H evolving contusions and decreased SAH, small B hygromas. Continue TBI team therapies. PRN haldol and ativan for agitation. - may end up needing scheduled haldol for impulsivity, will monitor today  Skull fracture - NSGY c/s, Dr. Kathyrn Sheriff Nondisplaced facial fractures - ENT c/s, Dr. Claudia Desanctis, nonop, head of bed elevation to help with swelling, follow up outpatient Alcohol  abuse - 186 on admission. CIWA, NH4 WNL Generalized lymphadenopthy - will need outpatient W/U ?CLL   FEN - D3 diet VTE - SCDs, Lovenox ID - no current abx, no leukocytosis, afebrile  Dispo - 4NP, PT/OT, eventual SNF  LOS: 30 days    Norm Parcel, [facility] Surgery 07/15/2021, 9:06 AM Please see Amion for pager number during day hours 7:00am-4:30pm

## 2021-07-15 NOTE — Progress Notes (Signed)
   07/15/21 1614  What Happened  Was fall witnessed? No  Was patient injured? Unsure  Patient found on floor  Found by Staff-comment Barnett Applebaum)  Stated prior activity other (comment) (sitting in chair)  Follow Up  MD notified Barkley Boards, PA  Time MD notified [phone]  Family notified Yes - comment  Time family notified [phone]  Adult Fall Risk Assessment  Risk Factor Category (scoring not indicated) High fall risk per protocol (document High fall risk)  Patient Fall Risk Level High fall risk  Adult Fall Risk Interventions  Required Bundle Interventions *See Row Information* High fall risk - low, moderate, and high requirements implemented  Screening for Fall Injury Risk (To be completed on HIGH fall risk patients) - Assessing Need for Floor Mats  Risk For Fall Injury- Criteria for Floor Mats Noncompliant with safety precautions  Vitals  BP (!) 163/88  MAP (mmHg) 102  BP Location Right Arm  BP Method Automatic  Patient Position (if appropriate) Lying  Pulse Rate 96  Pulse Rate Source Monitor  ECG Heart Rate 94  Resp 16  Oxygen Therapy  SpO2 100 %  O2 Device Room Air

## 2021-07-16 MED ORDER — CLONAZEPAM 0.25 MG PO TBDP
0.5000 mg | ORAL_TABLET | Freq: Two times a day (BID) | ORAL | Status: DC
  Administered 2021-07-16 – 2021-07-18 (×6): 0.5 mg via ORAL
  Filled 2021-07-16 (×6): qty 2

## 2021-07-16 MED ORDER — BETHANECHOL CHLORIDE 10 MG PO TABS
5.0000 mg | ORAL_TABLET | Freq: Three times a day (TID) | ORAL | Status: DC
  Administered 2021-07-16 – 2021-07-17 (×4): 5 mg via ORAL
  Filled 2021-07-16 (×4): qty 1

## 2021-07-16 MED ORDER — QUETIAPINE FUMARATE 50 MG PO TABS
25.0000 mg | ORAL_TABLET | Freq: Two times a day (BID) | ORAL | Status: DC
  Administered 2021-07-16: 25 mg via ORAL
  Filled 2021-07-16: qty 1

## 2021-07-16 MED ORDER — IBUPROFEN 200 MG PO TABS
200.0000 mg | ORAL_TABLET | Freq: Four times a day (QID) | ORAL | Status: DC | PRN
  Administered 2021-07-19 – 2021-07-30 (×7): 200 mg via ORAL
  Filled 2021-07-16 (×7): qty 1

## 2021-07-16 NOTE — Progress Notes (Signed)
Speech Language Pathology Treatment: Dysphagia;Cognitive-Linquistic  Patient Details Name: Randall Harvey. MRN: [id number] DOB: [birth year] Today's Date: 07/16/2021 Time: 1411-1440 SLP Time Calculation (min) (ACUTE ONLY): 29 min  Assessment / Plan / Recommendation Clinical Impression  Followed up for dysphagia and cognitive intervention. Pt at bedside, perseverating on wanting restraints off. Medical team following, pt unfortunately with fall yesterday and continues to exhibit poor safety awareness and insight to situation. Continued reorientation tasks and reasoning tasks however pt with moments of agitation and exhibits reduced executive function skills.  Assessed with thin liquids via cup and straw. Pt consumed water without overt s/sx of aspiration including consecutive swallows. Pt declined solid PO snack despite encouragement stating not hungry. Recommend advance liquids to thin liquids and continue mechanical soft textures with meds as tolerated. SLP to continue to follow .     HPI HPI: Pt is 60 yo male found down and inebriated with blood from L ear. Found to have L SAH and L temporal bone, L sphenoid, L maxillary fxs. PMH: COPD, HTN, PE, renal cell carcinoma, CVA, thrombocytopenia. Intubated 9/22-10/4      SLP Plan  Continue with current plan of care      Recommendations for follow up therapy are one component of a multi-disciplinary discharge planning process, led by the attending physician.  Recommendations may be updated based on patient status, additional functional criteria and insurance authorization.    Recommendations  Diet recommendations: Dysphagia 3 (mechanical soft);Thin liquid Liquids provided via: Cup;Straw Medication Administration: Whole meds with puree Supervision: Staff to assist with self feeding;Full supervision/cueing for compensatory strategies Compensations: Slow rate;Minimize environmental distractions;Small sips/bites;Other (Comment) Postural Changes  and/or Swallow Maneuvers: Seated upright 90 degrees                Oral Care Recommendations: Oral care BID Follow up Recommendations: Skilled Nursing facility Plan: Continue with current plan of care       [place], CCC-SLP Acute Rehabilitation Services    07/16/2021, 3:05 PM

## 2021-07-16 NOTE — Progress Notes (Signed)
Occupational Therapy Treatment Patient Details Name: Randall Harvey. MRN: [id number] DOB: [birth year] Today's Date: 07/16/2021   History of present illness Pt is 60 yo male found down and inebriated with blood from L ear. Found to have L SAH and L temporal bone, L sphenoid, L maxillary fxs. Intubated 9/22, PT signed off and was reordered. PMH: COPD, HTN, PE, renal cell carcinoma, CVA, thrombocytopenia.   OT comments  Pt progressed from supine to sink level grooming this session and chair positioning. 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 Pt requires steady (A) for all standing task with LOB no correctly reactions. Pt very detailed oral care appropriately. Pt washing face and then returning to chair level singing music / playing air guitar. Pt reports previously play guitar and very much enjoyed having access to music. Recommendation remains SNf at this time. Pt verbalize "L LE" feeling stiff and decreased knee flexion with transfer. Pt holding leg very straight and circumduction like pattern.   Recommendations for follow up therapy are one component of a multi-disciplinary discharge planning process, led by the attending physician.  Recommendations may be updated based on patient status, additional functional criteria and insurance authorization.    Follow Up Recommendations  SNF    Equipment Recommendations  Wheelchair (measurements OT);Wheelchair cushion (measurements OT);Other (comment);3 in 1 bedside commode (RW)    Recommendations for Other Services Speech consult    Precautions / Restrictions Precautions Precautions: Fall Precaution Comments: restraints       Mobility Bed Mobility Overal bed mobility: Needs Assistance Bed Mobility: Supine to Sit Rolling: Mod assist   Supine to sit: Mod assist     General bed mobility comments: pt immediately dizzy upon sitting. question need for vestibular eval     Transfers Overall transfer level: Needs assistance   Transfers: Sit to/from Stand Sit to Stand: Min assist         General transfer comment: requires RW for safety    Balance Overall balance assessment: Needs assistance Sitting-balance support: Bilateral upper extremity supported;Feet supported Sitting balance-Leahy Scale: Fair       Standing balance-Leahy Scale: Poor                             ADL either performed or assessed with clinical judgement   ADL Overall ADL's : Needs assistance/impaired     Grooming: Wash/dry face;Oral care;Moderate assistance;Standing Grooming Details (indicate cue type and reason): max (A) for steady during task. pt leaning chest again sink surface for steady. pt needs bil Ue to apply paste to brush due to L hand decreased grasp                 Toilet Transfer: Moderate assistance;Ambulation;RW             General ADL Comments: needs pacing of RW and OT holding RW back to keep it close to patient. Pt with scissoring at times during transfer. pt report LLE is "stiff" "locking up"     Vision       Perception     Praxis      Cognition Arousal/Alertness: Awake/alert Behavior During Therapy: Impulsive;Restless Overall Cognitive Status: Impaired/Different from baseline                 Rancho Levels of Cognitive Functioning Rancho Los Amigos Scales of Cognitive Functioning: Confused/appropriate  General Comments: after 5 minutes was able to RN what occured in session, expressing like the OT "i am keeping her" , pt expressing his need of a tissue with incorrect word use but enough the OT was able to figure out his needs. pt saying "yes thank you" pt happy and sining music on the computer. pt playing air guitar.        Exercises     Shoulder Instructions       General Comments VSS    Pertinent Vitals/ Pain       Pain Assessment: No/denies pain  Home Living                                           Prior Functioning/Environment              Frequency  Min 2X/week        Progress Toward Goals  OT Goals(current goals can now be found in the care plan section)  Progress towards OT goals: Progressing toward goals  Acute Rehab OT Goals Patient Stated Goal: to go to concert ADL Goals Pt Will Perform Grooming: with min guard assist;sitting Pt Will Perform Upper Body Bathing: with min assist;sitting Pt Will Transfer to Toilet: with mod assist;ambulating;bedside commode Additional ADL Goal #1: pt will follow 2 step command 75% of session  Plan Discharge plan remains appropriate    Co-evaluation                 AM-PAC OT "6 Clicks" Daily Activity     Outcome Measure   Help from another person eating meals?: A Little Help from another person taking care of personal grooming?: A Little Help from another person toileting, which includes using toliet, bedpan, or urinal?: A Little Help from another person bathing (including washing, rinsing, drying)?: A Little Help from another person to put on and taking off regular upper body clothing?: A Little Help from another person to put on and taking off regular lower body clothing?: A Lot 6 Click Score: 17    End of Session Equipment Utilized During Treatment: Gait belt;Rolling walker  OT Visit Diagnosis: Unsteadiness on feet (R26.81);Muscle weakness (generalized) (M62.81);Pain   Activity Tolerance Patient tolerated treatment well   Patient Left in chair;with call bell/phone within reach;with chair alarm set;with restraints reapplied;Other (comment) (RN charting while patient sings in chair playing air guitar)   Nurse Communication Mobility status;Precautions        Time: [phone] OT Time Calculation (min): 44 min  Charges: OT General Charges $OT Visit: 1 Visit OT Treatments $Self Care/Home Management : 38-52 mins   [place], OTR/L  Acute Rehabilitation Services Pager:  [phone] Office: [phone] .   Jeri Modena 07/16/2021, 2:01 PM

## 2021-07-16 NOTE — Progress Notes (Signed)
Progress Note  5 Days Post-Op  Subjective: Patient reports mild headache again this AM. He did have a fall yesterday afternoon over the side of the armchair. Reportedly landed on right side but no signs of injury and patient remained at baseline mental status. He is complaining of wanting wrist restraints off this AM.   Objective: Vital signs in last 24 hours: Temp:  [97.9 F (36.6 C)-98.7 F (37.1 C)] 98 F (36.7 C) (10/18 0717) Pulse Rate:  [70-96] 70 (10/18 0717) Resp:  [10-18] 16 (10/18 0717) BP: (122-163)/(71-90) 133/84 (10/18 0717) SpO2:  [96 %-100 %] 100 % (10/18 0717) Weight:  [70.4 kg] 70.4 kg (10/18 0456) Last BM Date: 07/12/21  Intake/Output from previous day: 10/17 0701 - 10/18 0700 In: 470 [P.O.:470] Out: -  Intake/Output this shift: No intake/output data recorded.  PE: Gen: NAD Neuro: alert and appropriate, following commands HEENT: no masses or lesions Neck: supple CV: RRR Pulm: unlabored breathing Abd: soft, NT Extr: wwp, no edema, wrists in soft restraints and mittens, posey in place   Lab Results:  Recent Labs    07/15/21 0829  WBC 9.8  HGB 15.0  HCT 45.5  PLT 243   BMET Recent Labs    07/15/21 0829  NA 135  K 4.0  CL 104  CO2 25  GLUCOSE 102*  BUN 14  CREATININE 0.74  CALCIUM 9.1   PT/INR No results for input(s): LABPROT, INR in the last 72 hours. CMP     Component Value Date/Time   NA 135 07/15/2021 0829   K 4.0 07/15/2021 0829   CL 104 07/15/2021 0829   CO2 25 07/15/2021 0829   GLUCOSE 102 (H) 07/15/2021 0829   BUN 14 07/15/2021 0829   CREATININE 0.74 07/15/2021 0829   CALCIUM 9.1 07/15/2021 0829   PROT 6.5 06/15/2021 2240   ALBUMIN 3.6 06/15/2021 2240   AST 38 06/15/2021 2240   ALT 37 06/15/2021 2240   ALKPHOS 52 06/15/2021 2240   BILITOT 0.7 06/15/2021 2240   GFRNONAA >60 07/15/2021 0829   Lipase  No results found for: LIPASE     Studies/Results: No results found.  Anti-infectives: Anti-infectives  (From admission, onward)    Start     Dose/Rate Route Frequency Ordered Stop   07/05/21 1200  ceFEPIme (MAXIPIME) 2 g in sodium chloride 0.9 % 100 mL IVPB        2 g 200 mL/hr over 30 Minutes Intravenous Every 8 hours 07/05/21 1052 07/11/21 1957   07/05/21 1130  ceFEPIme (MAXIPIME) 2 g in sodium chloride 0.9 % 100 mL IVPB  Status:  Discontinued        2 g 200 mL/hr over 30 Minutes Intravenous Every 12 hours 07/05/21 1042 07/05/21 1052   06/24/21 1800  ceFAZolin (ANCEF) IVPB 2g/100 mL premix        2 g 200 mL/hr over 30 Minutes Intravenous Every 8 hours 06/24/21 1116 06/28/21 1849   06/22/21 1100  ceFEPIme (MAXIPIME) 2 g in sodium chloride 0.9 % 100 mL IVPB  Status:  Discontinued        2 g 200 mL/hr over 30 Minutes Intravenous Every 8 hours 06/22/21 0957 06/24/21 1116        Assessment/Plan Found down SAH, IVH, parenchymal contusion - NSGY c/s, Dr. Kathyrn Sheriff, repeat [facility] 9/18 w/ progression of SAH. F/U CT H evolving contusions and decreased SAH, small B hygromas. Continue TBI team therapies. Increased scheduled klonopin this AM, continue seroquel qhs. Will monitor needs for prn  ativan/haldol and adjust meds accordingly  Skull fracture - NSGY c/s, Dr. Kathyrn Sheriff Nondisplaced facial fractures - ENT c/s, Dr. Claudia Desanctis, nonop, head of bed elevation to help with swelling, follow up outpatient Alcohol abuse - 186 on admission. CIWA, NH4 WNL Generalized lymphadenopthy - will need outpatient W/U ?CLL  Fall 10/17 - no signs of injury, continue q4 neuro checks    FEN - D3 diet VTE - SCDs, Lovenox ID - no current abx, no leukocytosis, afebrile   Dispo - 4NP, PT/OT, eventual SNF  LOS: 31 days    Norm Parcel, [facility] Surgery 07/16/2021, 8:20 AM Please see Amion for pager number during day hours 7:00am-4:30pm

## 2021-07-17 LAB — CBC WITH DIFFERENTIAL/PLATELET
Abs Immature Granulocytes: 0 10*3/uL (ref 0.00–0.07)
Basophils Absolute: 0.1 10*3/uL (ref 0.0–0.1)
Basophils Relative: 1 %
Eosinophils Absolute: 0.8 10*3/uL — ABNORMAL HIGH (ref 0.0–0.5)
Eosinophils Relative: 8 %
HCT: 46.5 % (ref 39.0–52.0)
Hemoglobin: 15.8 g/dL (ref 13.0–17.0)
Lymphocytes Relative: 39 %
Lymphs Abs: 3.7 10*3/uL (ref 0.7–4.0)
MCH: 32 pg (ref 26.0–34.0)
MCHC: 34 g/dL (ref 30.0–36.0)
MCV: 94.1 fL (ref 80.0–100.0)
Monocytes Absolute: 0.7 10*3/uL (ref 0.1–1.0)
Monocytes Relative: 7 %
Neutro Abs: 4.3 10*3/uL (ref 1.7–7.7)
Neutrophils Relative %: 45 %
Platelets: 224 10*3/uL (ref 150–400)
RBC: 4.94 MIL/uL (ref 4.22–5.81)
RDW: 11.3 % — ABNORMAL LOW (ref 11.5–15.5)
WBC: 9.5 10*3/uL (ref 4.0–10.5)
nRBC: 0 % (ref 0.0–0.2)
nRBC: 0 /100 WBC

## 2021-07-17 MED ORDER — QUETIAPINE FUMARATE 50 MG PO TABS: 50.0000 mg | ORAL_TABLET | Freq: Every day | ORAL | Status: DC

## 2021-07-17 MED ORDER — BETHANECHOL CHLORIDE 10 MG PO TABS
5.0000 mg | ORAL_TABLET | Freq: Two times a day (BID) | ORAL | Status: AC
  Administered 2021-07-17 – 2021-07-18 (×2): 5 mg via ORAL
  Filled 2021-07-17 (×2): qty 1

## 2021-07-17 MED ORDER — OXYCODONE HCL 5 MG PO TABS
5.0000 mg | ORAL_TABLET | ORAL | Status: DC | PRN
  Administered 2021-07-18 – 2021-07-30 (×31): 5 mg via ORAL
  Filled 2021-07-17 (×31): qty 1

## 2021-07-17 MED ORDER — QUETIAPINE FUMARATE 50 MG PO TABS
25.0000 mg | ORAL_TABLET | Freq: Every morning | ORAL | Status: DC
  Administered 2021-07-17: 25 mg via ORAL
  Filled 2021-07-17: qty 1

## 2021-07-17 MED ORDER — QUETIAPINE FUMARATE 50 MG PO TABS
50.0000 mg | ORAL_TABLET | Freq: Two times a day (BID) | ORAL | Status: DC
  Administered 2021-07-17 (×2): 50 mg via ORAL
  Filled 2021-07-17 (×2): qty 1

## 2021-07-17 NOTE — Progress Notes (Signed)
Physical Therapy Treatment Patient Details Name: Randall Harvey. MRN: [id number] DOB: 06-18-1961 Today's Date: 07/17/2021   History of Present Illness Pt is 60 yo male found down and inebriated with blood from L ear. Found to have L SAH and L temporal bone, L sphenoid, L maxillary fxs. Intubated 9/22, PT signed off and was reordered. PMH: COPD, HTN, PE, renal cell carcinoma, CVA, thrombocytopenia.    PT Comments    Pt resting upon arrival to room, agreeable to OOB mobility. Pt difficult to motivate to progress to standing today, benefits from being given functional task (go to the bathroom, open blinds of window). Pt very unsteady and complaining of severe L knee pain in standing, LOB with near fall x3 all corrected by PT with max assist. Pt returned to bed, in restraints given high fall risk and unpredictability. Will continue to follow.      Recommendations for follow up therapy are one component of a multi-disciplinary discharge planning process, led by the attending physician.  Recommendations may be updated based on patient status, additional functional criteria and insurance authorization.  Follow Up Recommendations  SNF;Supervision/Assistance - 24 hour     Equipment Recommendations  Rolling walker with 5" wheels    Recommendations for Other Services       Precautions / Restrictions Precautions Precautions: Fall Precaution Comments: waist and bilat wrist restraints     Mobility  Bed Mobility Overal bed mobility: Needs Assistance Bed Mobility: Supine to Sit     Supine to sit: Mod assist Sit to supine: Mod assist   General bed mobility comments: mod assist for trunk and LE management, scooting to/from EOB with very increased time and unsafe anterior leaning.    Transfers Overall transfer level: Needs assistance Equipment used: 1 person hand held assist Transfers: Sit to/from Stand Sit to Stand: Mod assist         General transfer comment: mod assist for power  up, rise, steady. STS x4 throughout session.  Ambulation/Gait Ambulation/Gait assistance: Mod assist;Max assist Gait Distance (Feet): 10 Feet (+2) Assistive device: 1 person hand held assist Gait Pattern/deviations: Step-through pattern;Decreased stride length;Shuffle;Trunk flexed;Narrow base of support Gait velocity: decr   General Gait Details: mod assist to steady via HHA, requiring max assist to correct pt LOB that would've most definitely led to fall x3. Pt with little to no awareness of this   Stairs             Wheelchair Mobility    Modified Rankin (Stroke Patients Only)       Balance Overall balance assessment: Needs assistance   Sitting balance-Leahy Scale: Fair Sitting balance - Comments: able to sit EOB or in recliner without PT support     Standing balance-Leahy Scale: Poor Standing balance comment: correcting LOB x3                            Cognition Arousal/Alertness: Awake/alert Behavior During Therapy: Impulsive;Restless Overall Cognitive Status: Impaired/Different from baseline Area of Impairment: Orientation;Attention;Memory;Following commands;Safety/judgement;Problem solving;Awareness;Rancho level               Rancho Levels of Cognitive Functioning Ranch[employer] Scales of Cognitive Functioning: Confused/inappropriate/non-agitated Orientation Level: Disoriented to;Place;Time;Situation Current Attention Level: Sustained Memory: Decreased short-term memory Following Commands: Follows one step commands with increased time;Follows multi-step commands with increased time Safety/Judgement: Decreased awareness of safety;Decreased awareness of deficits Awareness: Intellectual Problem Solving: Slow processing;Requires verbal cues;Requires tactile cues;Difficulty sequencing General Comments: Pt able  to state name, thinks he is in [place] and then states he is in [employer]. 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 Pt with zero safety awareness in standing, PT corrects LOB that would've led to fall x3. Pt completed x2 functional tasks - rolling up blinds and urinating in urinal with cuing. Pt tries to hug and kiss PT, difficult to redirect pt off of this      Exercises      General Comments        Pertinent Vitals/Pain Pain Assessment: Faces Faces Pain Scale: Hurts little more Pain Location: L knee Pain Descriptors / Indicators: Sore;Discomfort Pain Intervention(s): Limited activity within patient's tolerance;Monitored during session;Repositioned    Home Living                      Prior Function            PT Goals (current goals can now be found in the care plan section) Acute Rehab PT Goals Patient Stated Goal: go home PT Goal Formulation: Patient unable to participate in goal setting Time For Goal Achievement: 07/23/21 Potential to Achieve Goals: Fair Progress towards PT goals: Progressing toward goals    Frequency    Min 3X/week      PT Plan Current plan remains appropriate    Co-evaluation              AM-PAC PT "6 Clicks" Mobility   Outcome Measure  Help needed turning from your back to your side while in a flat bed without using bedrails?: A Little Help needed moving from lying on your back to sitting on the side of a flat bed without using bedrails?: A Lot Help needed moving to and from a bed to a chair (including a wheelchair)?: A Lot Help needed standing up from a chair using your arms (e.g., wheelchair or bedside chair)?: A Lot Help needed to walk in hospital room?: A Lot Help needed climbing 3-5 steps with a railing? : Total 6 Click Score: 12    End of Session Equipment Utilized During Treatment: Gait belt Activity Tolerance: Patient tolerated treatment well Patient left: in bed;with call bell/phone within reach;with bed alarm set;with restraints reapplied Nurse Communication: Mobility  status PT Visit Diagnosis: Other abnormalities of gait and mobility (R26.89);Pain Pain - Right/Left: Left Pain - part of body: Knee     Time: 0912-0945 PT Time Calculation (min) (ACUTE ONLY): 33 min  Charges:  $Gait Training: 8-22 mins $Therapeutic Activity: 8-22 mins                     Stacie Glaze, PT DPT Acute Rehabilitation Services Pager [phone]  Office [phone]    [place] 07/17/2021, 10:00 AM

## 2021-07-17 NOTE — Progress Notes (Signed)
Progress Note  6 Days Post-Op  Subjective: Patient reports pain all over this AM but then states he's doing fine. He is somewhat oriented to self and could tell me his first name but last name reported as Sharen Counter. He was not oriented to place, time or situation this AM. Per RN enclosure bed is not currently available.   Objective: Vital signs in last 24 hours: Temp:  [97.5 F (36.4 C)-98.5 F (36.9 C)] 97.8 F (36.6 C) (10/19 0459) Pulse Rate:  [83-99] 99 (10/18 1553) Resp:  [12-19] 12 (10/19 0459) BP: (118-142)/(82-97) 129/82 (10/19 0459) SpO2:  [93 %-97 %] 93 % (10/18 1553) Weight:  [67.3 kg] 67.3 kg (10/19 0500) Last BM Date: 07/12/21  Intake/Output from previous day: 10/18 0701 - 10/19 0700 In: 300 [P.O.:300] Out: 300 [Urine:300] Intake/Output this shift: No intake/output data recorded.  PE: Gen: NAD Neuro: alert, not oriented, able to follow commands HEENT: no masses or lesions Neck: supple CV: RRR Pulm: unlabored breathing Abd: soft, NT, soft waist restraint is present  Extr: wwp, no edema, soft wrist restraints present without concern for pressure injury    Lab Results:  Recent Labs    07/15/21 0829 07/17/21 0351  WBC 9.8 9.5  HGB 15.0 15.8  HCT 45.5 46.5  PLT 243 224   BMET Recent Labs    07/15/21 0829  NA 135  K 4.0  CL 104  CO2 25  GLUCOSE 102*  BUN 14  CREATININE 0.74  CALCIUM 9.1   PT/INR No results for input(s): LABPROT, INR in the last 72 hours. CMP     Component Value Date/Time   NA 135 07/15/2021 0829   K 4.0 07/15/2021 0829   CL 104 07/15/2021 0829   CO2 25 07/15/2021 0829   GLUCOSE 102 (H) 07/15/2021 0829   BUN 14 07/15/2021 0829   CREATININE 0.74 07/15/2021 0829   CALCIUM 9.1 07/15/2021 0829   PROT 6.5 06/15/2021 2240   ALBUMIN 3.6 06/15/2021 2240   AST 38 06/15/2021 2240   ALT 37 06/15/2021 2240   ALKPHOS 52 06/15/2021 2240   BILITOT 0.7 06/15/2021 2240   GFRNONAA >60 07/15/2021 0829   Lipase  No results found  for: LIPASE     Studies/Results: No results found.  Anti-infectives: Anti-infectives (From admission, onward)    Start     Dose/Rate Route Frequency Ordered Stop   07/05/21 1200  ceFEPIme (MAXIPIME) 2 g in sodium chloride 0.9 % 100 mL IVPB        2 g 200 mL/hr over 30 Minutes Intravenous Every 8 hours 07/05/21 1052 07/11/21 1957   07/05/21 1130  ceFEPIme (MAXIPIME) 2 g in sodium chloride 0.9 % 100 mL IVPB  Status:  Discontinued        2 g 200 mL/hr over 30 Minutes Intravenous Every 12 hours 07/05/21 1042 07/05/21 1052   06/24/21 1800  ceFAZolin (ANCEF) IVPB 2g/100 mL premix        2 g 200 mL/hr over 30 Minutes Intravenous Every 8 hours 06/24/21 1116 06/28/21 1849   06/22/21 1100  ceFEPIme (MAXIPIME) 2 g in sodium chloride 0.9 % 100 mL IVPB  Status:  Discontinued        2 g 200 mL/hr over 30 Minutes Intravenous Every 8 hours 06/22/21 0957 06/24/21 1116        Assessment/Plan Found down SAH, IVH, parenchymal contusion - NSGY c/s, Dr. Kathyrn Sheriff, repeat [facility] 9/18 w/ progression of SAH. F/U CT H evolving contusions and decreased SAH, small  B hygromas. Continue TBI team therapies. Continue scheduled klonopin, Continue BID seroquel but increased QHS dose. Will monitor needs for prn ativan/haldol and adjust meds accordingly  Skull fracture - NSGY c/s, Dr. Kathyrn Sheriff Nondisplaced facial fractures - ENT c/s, Dr. Claudia Desanctis, nonop, head of bed elevation to help with swelling, follow up outpatient Alcohol abuse - 186 on admission. CIWA Generalized lymphadenopthy - will need outpatient W/U ?CLL  Fall 10/17 - no signs of injury, continue q4 neuro checks    FEN - D3 diet VTE - SCDs, Lovenox ID - no current abx, no leukocytosis, afebrile   Dispo - 4NP, PT/OT, eventual SNF I called and updated patient's son over the phone.   LOS: 32 days    Norm Parcel, [facility] 07/17/2021, 7:44 AM Please see Amion for pager number during day hours 7:00am-4:30pm

## 2021-07-17 NOTE — TOC Progression Note (Signed)
Transition of Care (TOC) - Progression Note    Patient Details  Name: Randall Harvey. MRN: [id number] Date of Birth: [birth year]  Transition of Care [facility]) CM/SW Contact  Oren Section Cleta Alberts, RN Phone Number: 07/17/2021, 3:15 PM  Clinical Narrative:    Spoke with patient's son, Grayland Ormond by phone to discuss SNF bed offers; patient remains restrained, and will need to be out of restraints for 24 hours prior to discharge to SNF.  Son requests bed offers to be emailed to [email or web address].  Emailed bed offers to son; also Futures trader requesting that son can pick up patient belongings at [employer] as patient is incapacitated.   Expected Discharge Plan: [place] Barriers to Discharge: Continued Medical Work up  Expected Discharge Plan and Services Expected Discharge Plan: [place]   Discharge Planning Services: CM Consult Post Acute Care Choice: [place] Living arrangements for the past 2 months: Single Family Home                                       Social Determinants of Health (SDOH) Interventions    Readmission Risk Interventions No flowsheet data found.  Reinaldo Raddle, RN, BSN  Trauma/Neuro ICU Randall Manager [phone]

## 2021-07-17 NOTE — Progress Notes (Signed)
SLP Cancellation Note  Patient Details Name: Randall Harvey. MRN: [id number] DOB: [birth year]   Cancelled treatment:       Reason Eval/Treat Not Completed: Fatigue/lethargy limiting ability to participate. RN reports lethargy after administration of morning meds. SLP to f/u as able.    Ellwood Dense, [place], [place] Acute Rehabilitation Services Office Number: [phone]  Acie Fredrickson 07/17/2021, 11:00 AM

## 2021-07-17 NOTE — Progress Notes (Signed)
Speech Language Pathology Treatment: Dysphagia;Cognitive-Linquistic  Patient Details Name: Randall Harvey. MRN: [id number] DOB: 12-Jun-1961 Today's Date: 07/17/2021 Time: 8882-8003 SLP Time Calculation (min) (ACUTE ONLY): 18 min  Assessment / Plan / Recommendation Clinical Impression  Pt sitting upright in bed, with nursing staff present, upon SLP arrival. Pt impulsively self-feeding dysphagia 3, thin liquid meal tray despite maximal verbal and visual cueing from clinician for small bites/sips at slow rate. Bites of solid textures, though in large volumes, resulted in no overt s/sx of aspiration. Thin liquids via consecutive straw sips in large volumes resulted in overt coughing x1 and throat clear x1, with quick recovery. Pt became agitated with manual removal of straw to aid in small sips, one at a time, which eliminated s/sx of aspiration. Attention and reduced safety awareness continue to increase pt risk for aspiration and it is recommended that he receive full supervision for all meals to monitor rate of intake. Educated Therapist, sports and family, who arrived in room at close of session, in regards to diet recommendations and swallow strategies as it relates to results of instrumental testing completed 10/13. SLP to f/u for tolerance, training in strategies and advanced trials as appropriate. Treatment for cognitive functions also to continue.    HPI HPI: Pt is 60 yo male found down and inebriated with blood from L ear. Found to have L SAH and L temporal bone, L sphenoid, L maxillary fxs. PMH: COPD, HTN, PE, renal cell carcinoma, CVA, thrombocytopenia. Intubated 9/22-10/4      SLP Plan  Continue with current plan of care      Recommendations for follow up therapy are one component of a multi-disciplinary discharge planning process, led by the attending physician.  Recommendations may be updated based on patient status, additional functional criteria and insurance authorization.    Recommendations   Diet recommendations: Dysphagia 3 (mechanical soft);Thin liquid Liquids provided via: Cup;Straw Medication Administration: Whole meds with puree Supervision: Staff to assist with self feeding;Full supervision/cueing for compensatory strategies Compensations: Minimize environmental distractions;Slow rate;Small sips/bites Postural Changes and/or Swallow Maneuvers: Seated upright 90 degrees                Oral Care Recommendations: Oral care BID Follow up Recommendations: Skilled Nursing facility SLP Visit Diagnosis: Dysphagia, oropharyngeal phase (R13.12);Cognitive communication deficit (R41.841) Plan: Continue with current plan of care       [place], [place], [place] Office Number: [phone]  Acie Fredrickson  07/17/2021, 1:58 PM

## 2021-07-18 MED ORDER — QUETIAPINE FUMARATE 50 MG PO TABS
75.0000 mg | ORAL_TABLET | Freq: Two times a day (BID) | ORAL | Status: DC
  Administered 2021-07-18 – 2021-07-20 (×5): 75 mg via ORAL
  Filled 2021-07-18 (×5): qty 2

## 2021-07-18 MED ORDER — ENSURE ENLIVE PO LIQD
237.0000 mL | Freq: Two times a day (BID) | ORAL | Status: DC
  Administered 2021-07-18 – 2021-07-30 (×25): 237 mL via ORAL
  Filled 2021-07-18: qty 237

## 2021-07-18 NOTE — Progress Notes (Signed)
Nutrition Follow-up  DOCUMENTATION CODES:   Non-severe (moderate) malnutrition in context of chronic illness  INTERVENTION:  - Continue MVI with minerals daily  - Continue Magic cup TID with meals, each supplement provides 290 kcal and 9 grams of protein  - Ensure Enlive po BID, each supplement provides 350 kcal and 20 grams of protein  NUTRITION DIAGNOSIS:   Moderate Malnutrition related to chronic illness (COPD) as evidenced by moderate fat depletion, moderate muscle depletion.  On-going  GOAL:   Patient will meet greater than or equal to 90% of their needs  Progressing, addressing need through PO intake and nutritional supplement  MONITOR:   Diet advancement, Labs, Weight trends, TF tolerance, I & O's  REASON FOR ASSESSMENT:   Consult Enteral/tube feeding initiation and management, Assessment of nutrition requirement/status  ASSESSMENT:   60 year old male who presented to the ED on 9/17 after being found down with blood draining from L ear after unknown trauma. Pt was intoxicated. PMH of renal cell carcinoma, HTN, stroke, COPD. Pt found to have [place], IVH, parenchymal contusion, skull fx, nondisplaced facial fx.  09/21 - Cortrak placed (tip gastric) 09/22 - intubated for airway protection  10/04 - extubated 10/05 - s/p BSE recommending NPO 10/10 - pt pulled out Cortrak tube 10/11 - RN attempted NG tube placement which was unsuccessful 10/13 - s/p MBS, diet advanced to dysphagia 3 with nectar-thick liquids 10/18- SLP re-evaluation, advanced to thin liquids, continue mechanical soft textures  Pt reports feeling well today and having a good appetite. Observed lunch tray 100% eaten on side table.  Noted meal completions:  10/18: 90% x1 recorded meal 10/20: 100% x1 recorded meal  Given continued weight loss and malnutrition, will continue to send supplements to help meet calorie and protein needs.  Medications: folvite, MVI with minerals, thiamine  Labs:  reviewed  Diet Order:   Diet Order             DIET DYS 3 Room service appropriate? Yes with Assist; Fluid consistency: Thin  Diet effective now                   EDUCATION NEEDS:   Not appropriate for education at this time  Skin:  Skin Assessment: Reviewed RN Assessment Skin Integrity Issues:: Other (Comment) Other: -  Last BM:  07/16/21  Height:   Ht Readings from Last 1 Encounters:  06/15/21 5\' 9"  (1.753 m)    Weight:   Wt Readings from Last 1 Encounters:  07/18/21 67.7 kg    BMI:  Body mass index is 22.04 kg/m.  Estimated Nutritional Needs:   Kcal:  1800-2000  Protein:  90-100g  Fluid:  >1.8L  Clayborne Dana, RDN, LDN Clinical Nutrition

## 2021-07-18 NOTE — Progress Notes (Signed)
Progress Note  7 Days Post-Op  Subjective: Patient resting this AM. Oriented to self and could tell me that the year is 2020 something. When asked if we were in a hospital he said yes but did not have any idea why he might be in the hospital. Per RN, wrist restraints have been off since yesterday. He got 1 dose of prn ativan and 1 dose of prn haldol yesterday.   Objective: Vital signs in last 24 hours: Temp:  [97.9 F (36.6 C)-98.2 F (36.8 C)] 98.1 F (36.7 C) (10/20 0759) Pulse Rate:  [81-107] 89 (10/20 0759) Resp:  [16-18] 18 (10/20 0759) BP: (99-128)/(69-94) 128/92 (10/20 0759) SpO2:  [96 %-100 %] 96 % (10/20 0759) Weight:  [67.7 kg] 67.7 kg (10/20 0500) Last BM Date: 07/12/21  Intake/Output from previous day: No intake/output data recorded. Intake/Output this shift: No intake/output data recorded.  PE: Gen: NAD Neuro: alert, not oriented, able to follow commands HEENT: no masses or lesions Neck: supple CV: RRR Pulm: unlabored breathing Abd: soft, NT, soft waist restraint is present  Extr: wwp, no edema   Lab Results:  Recent Labs    07/15/21 0829 07/17/21 0351  WBC 9.8 9.5  HGB 15.0 15.8  HCT 45.5 46.5  PLT 243 224   BMET Recent Labs    07/15/21 0829  NA 135  K 4.0  CL 104  CO2 25  GLUCOSE 102*  BUN 14  CREATININE 0.74  CALCIUM 9.1   PT/INR No results for input(s): LABPROT, INR in the last 72 hours. CMP     Component Value Date/Time   NA 135 07/15/2021 0829   K 4.0 07/15/2021 0829   CL 104 07/15/2021 0829   CO2 25 07/15/2021 0829   GLUCOSE 102 (H) 07/15/2021 0829   BUN 14 07/15/2021 0829   CREATININE 0.74 07/15/2021 0829   CALCIUM 9.1 07/15/2021 0829   PROT 6.5 06/15/2021 2240   ALBUMIN 3.6 06/15/2021 2240   AST 38 06/15/2021 2240   ALT 37 06/15/2021 2240   ALKPHOS 52 06/15/2021 2240   BILITOT 0.7 06/15/2021 2240   GFRNONAA >60 07/15/2021 0829   Lipase  No results found for: LIPASE     Studies/Results: No results  found.  Anti-infectives: Anti-infectives (From admission, onward)    Start     Dose/Rate Route Frequency Ordered Stop   07/05/21 1200  ceFEPIme (MAXIPIME) 2 g in sodium chloride 0.9 % 100 mL IVPB        2 g 200 mL/hr over 30 Minutes Intravenous Every 8 hours 07/05/21 1052 07/11/21 1957   07/05/21 1130  ceFEPIme (MAXIPIME) 2 g in sodium chloride 0.9 % 100 mL IVPB  Status:  Discontinued        2 g 200 mL/hr over 30 Minutes Intravenous Every 12 hours 07/05/21 1042 07/05/21 1052   06/24/21 1800  ceFAZolin (ANCEF) IVPB 2g/100 mL premix        2 g 200 mL/hr over 30 Minutes Intravenous Every 8 hours 06/24/21 1116 06/28/21 1849   06/22/21 1100  ceFEPIme (MAXIPIME) 2 g in sodium chloride 0.9 % 100 mL IVPB  Status:  Discontinued        2 g 200 mL/hr over 30 Minutes Intravenous Every 8 hours 06/22/21 0957 06/24/21 1116        Assessment/Plan Found down SAH, IVH, parenchymal contusion - NSGY c/s, Dr. Kathyrn Sheriff, repeat [facility] 9/18 w/ progression of SAH. F/U CT H evolving contusions and decreased SAH, small B hygromas. Continue TBI  team therapies. Continue scheduled BID klonopin, Increased BID seroquel. Will monitor needs for prn ativan/haldol and adjust meds accordingly  Skull fracture - NSGY c/s, Dr. Kathyrn Sheriff Nondisplaced facial fractures - ENT c/s, Dr. Claudia Desanctis, nonop, head of bed elevation to help with swelling, follow up outpatient Alcohol abuse - 186 on admission. CIWA Generalized lymphadenopthy - will need outpatient W/U ?CLL  Fall 10/17 - no signs of injury, continue q4 neuro checks    FEN - D3 diet VTE - SCDs, Lovenox ID - no current abx, no leukocytosis, afebrile   Dispo - 4NP, PT/OT, eventual SNF   LOS: 33 days    Norm Parcel, [facility] 07/18/2021, 8:23 AM Please see Amion for pager number during day hours 7:00am-4:30pm

## 2021-07-19 ENCOUNTER — Inpatient Hospital Stay (HOSPITAL_COMMUNITY): Payer: Medicare Other

## 2021-07-19 MED ORDER — POLYETHYLENE GLYCOL 3350 17 G PO PACK
17.0000 g | PACK | Freq: Every day | ORAL | Status: DC
  Administered 2021-07-19 – 2021-07-29 (×9): 17 g via ORAL
  Filled 2021-07-19 (×9): qty 1

## 2021-07-19 MED ORDER — CLONAZEPAM 0.25 MG PO TBDP
0.5000 mg | ORAL_TABLET | Freq: Three times a day (TID) | ORAL | Status: DC
  Administered 2021-07-19 – 2021-07-30 (×34): 0.5 mg via ORAL
  Filled 2021-07-19 (×34): qty 2

## 2021-07-19 MED ORDER — NICOTINE 14 MG/24HR TD PT24
14.0000 mg | MEDICATED_PATCH | Freq: Every day | TRANSDERMAL | Status: DC
  Administered 2021-07-19 – 2021-07-30 (×12): 14 mg via TRANSDERMAL
  Filled 2021-07-19 (×12): qty 1

## 2021-07-19 MED ORDER — DOCUSATE SODIUM 100 MG PO CAPS
100.0000 mg | ORAL_CAPSULE | Freq: Two times a day (BID) | ORAL | Status: DC
  Administered 2021-07-19 – 2021-07-30 (×22): 100 mg via ORAL
  Filled 2021-07-19 (×23): qty 1

## 2021-07-19 NOTE — Progress Notes (Signed)
Physical Therapy Treatment Patient Details Name: Randall Harvey. MRN: [id number] DOB: 08/18/1961 Today's Date: 07/19/2021   History of Present Illness Pt is 60 yo male found down and inebriated with blood from L ear. Found to have L SAH and L temporal bone, L sphenoid, L maxillary fxs. Intubated 9/22, PT signed off and was reordered. xray L knee shows Pronounced worsening of degenerative disease of the medial  compartment, questionable baker's cyst, small joint effusion. PMH: COPD, HTN, PE, renal cell carcinoma, CVA, thrombocytopenia.    PT Comments    Pt very distractible throughout session, requires simple cues to stay on task. Pt ambulatory for x2 short hallway distances with RW, overall requiring min-mod assist for steady and assist pt in RW use. Pt unpredictably and impulsively attempts to sit down during gait with no chair behind him, requiring RN to bring chair for pt. PT to continue to follow.     Recommendations for follow up therapy are one component of a multi-disciplinary discharge planning process, led by the attending physician.  Recommendations may be updated based on patient status, additional functional criteria and insurance authorization.  Follow Up Recommendations  SNF;Supervision/Assistance - 24 hour     Equipment Recommendations  Rolling walker with 5" wheels    Recommendations for Other Services       Precautions / Restrictions Precautions Precautions: Fall Restrictions Weight Bearing Restrictions: No     Mobility  Bed Mobility Overal bed mobility: Needs Assistance Bed Mobility: Supine to Sit;Sit to Supine     Supine to sit: Min assist;HOB elevated Sit to supine: Min assist;HOB elevated   General bed mobility comments: for truncal management, boost up in bed upon return to supine.    Transfers Overall transfer level: Needs assistance Equipment used: Rolling walker (2 wheeled) Transfers: Sit to/from Stand Sit to Stand: Mod assist          General transfer comment: mod assist for power up, rise, steadying. STS x4 throughout session.  Ambulation/Gait Ambulation/Gait assistance: Min assist;Mod assist Gait Distance (Feet): 30 Feet (x2) Assistive device: Rolling walker (2 wheeled) Gait Pattern/deviations: Step-through pattern;Decreased stride length;Shuffle;Trunk flexed;Narrow base of support Gait velocity: decr   General Gait Details: mod assist for keeping RW close to pt, steadying, correcting LOB and pt's impulsive and unexpected attempts to sit. RN brought chair for pt to have seated rest break x2 minutes in hallway.   Stairs             Wheelchair Mobility    Modified Rankin (Stroke Patients Only)       Balance Overall balance assessment: Needs assistance   Sitting balance-Leahy Scale: Fair Sitting balance - Comments: able to sit EOB  without PT support     Standing balance-Leahy Scale: Poor Standing balance comment: reliant on external assist, very high fall risk                            Cognition Arousal/Alertness: Awake/alert Behavior During Therapy: Impulsive;Restless Overall Cognitive Status: Impaired/Different from baseline Area of Impairment: Orientation;Attention;Memory;Following commands;Safety/judgement;Problem solving;Awareness;Rancho level               Rancho Levels of Cognitive Functioning [place] [employer] Scales of Cognitive Functioning: Confused/inappropriate/non-agitated Orientation Level: Situation;Disoriented to Current Attention Level: Sustained Memory: Decreased short-term memory Following Commands: Follows one step commands with increased time;Follows multi-step commands inconsistently Safety/Judgement: Decreased awareness of safety;Decreased awareness of deficits Awareness: Emergent Problem Solving: Slow processing;Requires verbal cues;Requires tactile cues;Difficulty sequencing  General Comments: Pt very difficult to keep on task today, pt's sister and  brother in law are present. Pt making several sexually suggestive comments, able to be redirected. Pt with little to no safety awareness with RW, does not respond well to cues. Pt able to state he is in [place], in the hospital, and he spoke with policemen today but does not know why he is here.      Exercises      General Comments        Pertinent Vitals/Pain Pain Assessment: Faces Faces Pain Scale: Hurts little more Pain Location: L knee Pain Descriptors / Indicators: Sore;Discomfort Pain Intervention(s): Limited activity within patient's tolerance;Monitored during session;Repositioned    Home Living                      Prior Function            PT Goals (current goals can now be found in the care plan section) Acute Rehab PT Goals Patient Stated Goal: go home PT Goal Formulation: Patient unable to participate in goal setting Time For Goal Achievement: 07/23/21 Potential to Achieve Goals: Fair Progress towards PT goals: Progressing toward goals    Frequency    Min 3X/week      PT Plan Current plan remains appropriate    Co-evaluation              AM-PAC PT "6 Clicks" Mobility   Outcome Measure  Help needed turning from your back to your side while in a flat bed without using bedrails?: A Little Help needed moving from lying on your back to sitting on the side of a flat bed without using bedrails?: A Lot Help needed moving to and from a bed to a chair (including a wheelchair)?: A Lot Help needed standing up from a chair using your arms (e.g., wheelchair or bedside chair)?: A Lot Help needed to walk in hospital room?: A Lot Help needed climbing 3-5 steps with a railing? : Total 6 Click Score: 12    End of Session Equipment Utilized During Treatment: Gait belt Activity Tolerance: Patient tolerated treatment well Patient left: in bed;with call bell/phone within reach;with bed alarm set;with family/visitor present (sister and brother in law at  bedside) Nurse Communication: Mobility status PT Visit Diagnosis: Other abnormalities of gait and mobility (R26.89);Pain Pain - Right/Left: Left Pain - part of body: Knee     Time: 7106-2694 PT Time Calculation (min) (ACUTE ONLY): 21 min  Charges:  $Gait Training: 8-22 mins                     Stacie Glaze, PT DPT Acute Rehabilitation Services Pager [phone]  Office [phone]    Roxine Caddy E Ruffin Pyo 07/19/2021, 2:41 PM

## 2021-07-19 NOTE — Progress Notes (Signed)
Pt posey belt off and restraints discontinued as verbally ordered by PA Claiborne Billings. Will continue to closely monitor pt and redirect and orient. Bed alarm on. Delia Heady RN

## 2021-07-19 NOTE — TOC Progression Note (Addendum)
Transition of Care (TOC) - Progression Note    Patient Details  Name: Randall Harvey. MRN: [id number] Date of Birth: 1961/05/19  Transition of Care [facility]) CM/SW Contact  Oren Section Cleta Alberts, RN Phone Number: 07/19/2021, 3:36 PM  Clinical Narrative:    Multiple attempts made today to contact patient's son, Grayland Ormond, to discuss bed offers.  Noted patient out of restraints today.  Will ask weekend social worker to follow-up with patient's son regarding bed offers.   Expected Discharge Plan: [place] Barriers to Discharge: Continued Medical Work up  Expected Discharge Plan and Services Expected Discharge Plan: [place]   Discharge Planning Services: CM Consult Post Acute Care Choice: [place] Living arrangements for the past 2 months: Single Family Home                                       Social Determinants of Health (SDOH) Interventions    Readmission Risk Interventions No flowsheet data found.  Reinaldo Raddle, RN, BSN  Trauma/Neuro ICU Case Manager [phone]
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
## 2021-07-19 NOTE — Progress Notes (Signed)
Pt transported off unit to x-ray. Delia Heady RN
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
## 2021-07-20 MED ORDER — QUETIAPINE FUMARATE 100 MG PO TABS
100.0000 mg | ORAL_TABLET | Freq: Two times a day (BID) | ORAL | Status: DC
  Administered 2021-07-20 – 2021-07-30 (×20): 100 mg via ORAL
  Filled 2021-07-20 (×20): qty 1

## 2021-07-20 MED ORDER — HALOPERIDOL LACTATE 5 MG/ML IJ SOLN
10.0000 mg | Freq: Once | INTRAMUSCULAR | Status: AC
  Administered 2021-07-20: 10 mg via INTRAVENOUS
  Filled 2021-07-20: qty 2

## 2021-07-20 NOTE — Progress Notes (Signed)
   Trauma/Critical Care Follow Up Note  Subjective:    Overnight Issues:   Objective:  Vital signs for last 24 hours: Temp:  [97.3 F (36.3 C)-98.7 F (37.1 C)] 97.5 F (36.4 C) (10/22 1101) Pulse Rate:  [80-105] 98 (10/22 1101) Resp:  [16-19] 16 (10/22 1101) BP: (89-147)/(78-98) 113/83 (10/22 1101) SpO2:  [94 %-99 %] 96 % (10/22 1101) Weight:  [69.7 kg] 69.7 kg (10/22 0624)  Hemodynamic parameters for last 24 hours:    Intake/Output from previous day: 10/21 0701 - 10/22 0700 In: -  Out: 500 [Urine:500]  Intake/Output this shift: No intake/output data recorded.  Vent settings for last 24 hours:    Physical Exam:  Gen: comfortable, no distress Neuro: non-focal exam HEENT: PERRL Neck: supple CV: RRR Pulm: unlabored breathing Abd: soft, NT GU: clear yellow urine Extr: wwp, no edema   No results found for this or any previous visit (from the past 24 hour(s)).  Assessment & Plan:  Present on Admission:  Skull fracture ([place])    LOS: 35 days   Additional comments:I reviewed the patient's new clinical lab test results.   and I reviewed the patients new imaging test results.    Found down  SAH, IVH, parenchymal contusion - NSGY c/s, Dr. Kathyrn Sheriff, repeat [facility] 9/18 w/ progression of SAH. F/U CT H evolving contusions and decreased SAH, small B hygromas. Continue TBI team therapies. Increased scheduled klonopin to TID, continue BID seroquel. Will monitor needs for prn ativan and adjust meds accordingly  Skull fracture - NSGY c/s, Dr. Kathyrn Sheriff Nondisplaced facial fractures - ENT c/s, Dr. Claudia Desanctis, nonop, head of bed elevation to help with swelling, follow up outpatient Alcohol abuse - 186 on admission. CIWA Generalized lymphadenopthy - will need outpatient W/U ?CLL  Fall 10/17 - no signs of injury, continue q4 neuro checks    FEN - D3 diet VTE - SCDs, Lovenox ID - no current abx, no leukocytosis, afebrile   Dispo - 4NP, PT/OT, eventual SNF  Jesusita Oka,  MD Trauma & General Surgery Please use [email or web address] to contact on call provider  07/20/2021  *Care during the described time interval was provided by me. I have reviewed this patient's available data, including medical history, events of note, physical examination and test results as part of my evaluation.

## 2021-07-20 NOTE — Progress Notes (Signed)
Pt keeps on trying to get out of bed, when asked why and where he's going, pt stated "I'm trying to get to my cocaine", RN reorient pt to room and surroundings. Not yet time for prn medication, MD notified and new orders received. Will continue to closely monitor. Bed alarm on. Delia Heady RN

## 2021-07-21 NOTE — Progress Notes (Signed)
   Trauma/Critical Care Follow Up Note  Subjective:    Overnight Issues:  Needed increased psych meds   Objective:  Vital signs for last 24 hours: Temp:  [97.4 F (36.3 C)-98 F (36.7 C)] 97.6 F (36.4 C) (10/23 0800) Pulse Rate:  [73-98] 87 (10/23 0800) Resp:  [15-19] 16 (10/23 0800) BP: (108-136)/(75-119) 136/119 (10/23 0800) SpO2:  [96 %-100 %] 100 % (10/23 0800) Weight:  [70.9 kg] 70.9 kg (10/23 0554)   Intake/Output from previous day: 10/22 0701 - 10/23 0700 In: -  Out: 1000 [Urine:1000]  Intake/Output this shift: No intake/output data recorded.  Vent settings for last 24 hours:    Physical Exam:  Gen: comfortable, no distress Neuro: non-focal exam HEENT: PERRL Neck: supple CV: regular  Pulm: unlabored breathing Abd: soft, NT, ND GU: clear yellow urine Extr: wwp, no edema   No results found for this or any previous visit (from the past 24 hour(s)).  Assessment & Plan:  Present on Admission:  Skull fracture ([place])    LOS: 36 days   Additional comments:I reviewed the patient's new clinical lab test results.   and I reviewed the patients new imaging test results.    Found down  SAH, IVH, parenchymal contusion - NSGY c/s, Dr. Kathyrn Sheriff, repeat [facility] 9/18 w/ progression of SAH. F/U CT H evolving contusions and decreased SAH, small B hygromas. Continue TBI team therapies. Scheduled klonopin to TID, continue BID seroquel. Will monitor needs for prn ativan and adjust meds accordingly  Skull fracture - NSGY c/s, Dr. Kathyrn Sheriff Nondisplaced facial fractures - ENT c/s, Dr. Claudia Desanctis, nonop, head of bed elevation to help with swelling, follow up outpatient Alcohol abuse - 186 on admission. CIWA Generalized lymphadenopthy - will need outpatient W/U ?CLL  Fall 10/17 - no signs of injury, continue q4 neuro checks    FEN - D3 diet VTE - SCDs, Lovenox ID - no current abx, no leukocytosis, afebrile   Dispo - 4NP, PT/OT, eventual SNF  Milus Height, MD FACS Surgical  Oncology, General Surgery, Trauma and [place] Surgery, [place] for weekday/non holidays Check [email or web address] for coverage night/weekend/holidays  Do not use SecureChat as it is not reliable for timely patient care.     07/21/2021  *Care during the described time interval was provided by me. I have reviewed this patient's available data, including medical history, events of note, physical examination and test results as part of my evaluation.

## 2021-07-21 NOTE — TOC Progression Note (Signed)
Transition of Care (TOC) - Progression Note    Patient Details  Name: Randall Harvey. MRN: [id number] Date of Birth: August 15, 1961  Transition of Care [facility]) CM/SW [place], [place] Phone Number: 07/21/2021, 10:55 AM  Clinical Narrative:    CSW has been attempting to reach patient's son regarding bed offers. At this time patient has a bed offer from [place] and [facility]. Will continue to try and reach family.    Expected Discharge Plan: [place] Barriers to Discharge: Continued Medical Work up  Expected Discharge Plan and Services Expected Discharge Plan: [place]   Discharge Planning Services: CM Consult Post Acute Care Choice: [place] Living arrangements for the past 2 months: Single Family Home                                       Social Determinants of Health (SDOH) Interventions    Readmission Risk Interventions No flowsheet data found.

## 2021-07-22 LAB — SARS CORONAVIRUS 2 (TAT 6-24 HRS): SARS Coronavirus 2: NEGATIVE

## 2021-07-22 MED ORDER — HALOPERIDOL LACTATE 5 MG/ML IJ SOLN
5.0000 mg | Freq: Once | INTRAMUSCULAR | Status: AC
  Administered 2021-07-22: 5 mg via INTRAVENOUS
  Filled 2021-07-22: qty 1
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
## 2021-07-22 NOTE — Progress Notes (Signed)
11 Days Post-Op  Subjective: CC: Calm and cooperative this am. Out of restraints. Tolerating diet without n/v. Last BM documented 10/21. Voiding. Working with therapies.   Objective: Vital signs in last 24 hours: Temp:  [97.7 F (36.5 C)-98.3 F (36.8 C)] 98.3 F (36.8 C) (10/24 0716) Pulse Rate:  [78-100] 90 (10/24 0716) Resp:  [18-20] 20 (10/24 0716) BP: (109-133)/(66-95) 120/84 (10/24 0716) SpO2:  [96 %-100 %] 98 % (10/24 0716) Last BM Date: 07/19/21  Intake/Output from previous day: 10/23 0701 - 10/24 0700 In: 600 [P.O.:600] Out: 2000 [Urine:2000] Intake/Output this shift: No intake/output data recorded.  PE: Gen: comfortable, no distress Neuro: non-focal exam HEENT: PERRL Neck: supple CV: regular  Pulm: unlabored breathing Abd: soft, NT, ND Extr: wwp, no edema  Lab Results:  No results for input(s): WBC, HGB, HCT, PLT in the last 72 hours. BMET No results for input(s): NA, K, CL, CO2, GLUCOSE, BUN, CREATININE, CALCIUM in the last 72 hours. PT/INR No results for input(s): LABPROT, INR in the last 72 hours. CMP     Component Value Date/Time   NA 135 07/15/2021 0829   K 4.0 07/15/2021 0829   CL 104 07/15/2021 0829   CO2 25 07/15/2021 0829   GLUCOSE 102 (H) 07/15/2021 0829   BUN 14 07/15/2021 0829   CREATININE 0.74 07/15/2021 0829   CALCIUM 9.1 07/15/2021 0829   PROT 6.5 06/15/2021 2240   ALBUMIN 3.6 06/15/2021 2240   AST 38 06/15/2021 2240   ALT 37 06/15/2021 2240   ALKPHOS 52 06/15/2021 2240   BILITOT 0.7 06/15/2021 2240   GFRNONAA >60 07/15/2021 0829   Lipase  No results found for: LIPASE  Studies/Results: No results found.  Anti-infectives: Anti-infectives (From admission, onward)    Start     Dose/Rate Route Frequency Ordered Stop   07/05/21 1200  ceFEPIme (MAXIPIME) 2 g in sodium chloride 0.9 % 100 mL IVPB        2 g 200 mL/hr over 30 Minutes Intravenous Every 8 hours 07/05/21 1052 07/11/21 1957   07/05/21 1130  ceFEPIme (MAXIPIME)  2 g in sodium chloride 0.9 % 100 mL IVPB  Status:  Discontinued        2 g 200 mL/hr over 30 Minutes Intravenous Every 12 hours 07/05/21 1042 07/05/21 1052   06/24/21 1800  ceFAZolin (ANCEF) IVPB 2g/100 mL premix        2 g 200 mL/hr over 30 Minutes Intravenous Every 8 hours 06/24/21 1116 06/28/21 1849   06/22/21 1100  ceFEPIme (MAXIPIME) 2 g in sodium chloride 0.9 % 100 mL IVPB  Status:  Discontinued        2 g 200 mL/hr over 30 Minutes Intravenous Every 8 hours 06/22/21 0957 06/24/21 1116        Assessment/Plan Found down SAH, IVH, parenchymal contusion - NSGY c/s, Dr. Kathyrn Sheriff, repeat [facility] 9/18 w/ progression of SAH. F/U CT H evolving contusions and decreased SAH, small B hygromas. Continue TBI team therapies. Scheduled klonopin to TID, continue BID seroquel. Ativan prn Skull fracture - NSGY c/s, Dr. Kathyrn Sheriff Nondisplaced facial fractures - ENT c/s, Dr. Claudia Desanctis, nonop, head of bed elevation to help with swelling, follow up outpatient Alcohol abuse - 186 on admission. CIWA Generalized lymphadenopthy - will need outpatient W/U ?CLL  Fall 10/17 - no signs of injury FEN - D3 diet VTE - SCDs, Lovenox ID - no current abx, afebrile Dispo - 4NP, PT/OT, SNF (has bed offer per TOC note).    LOS:  37 days    [facility] 07/22/2021, 11:10 AM Please see Amion for pager number during day hours 7:00am-4:30pm

## 2021-07-23 NOTE — Progress Notes (Signed)
12 Days Post-Op  Subjective: CC: Notes reviewed.  No acute changes overnight.  Tolerating diet without nausea or vomiting.  RN reports BM yesterday.  Voiding.  Denies any complaints currently.  Objective: Vital signs in last 24 hours: Temp:  [97.3 F (36.3 C)-98.5 F (36.9 C)] 97.6 F (36.4 C) (10/25 0700) Pulse Rate:  [88] 88 (10/24 1500) Resp:  [20] 20 (10/24 2001) BP: (112-126)/(60-77) 120/60 (10/25 0700) SpO2:  [97 %-98 %] 97 % (10/24 2001) Weight:  [72.1 kg] 72.1 kg (10/25 0500) Last BM Date: 07/19/21  Intake/Output from previous day: 10/24 0701 - 10/25 0700 In: 600 [P.O.:600] Out: 1377 [Urine:1375; Stool:2] Intake/Output this shift: No intake/output data recorded.  PE: Gen: comfortable, no distress Neuro: non-focal exam HEENT: PERRL Neck: supple CV: regular  Pulm: unlabored breathing Abd: soft, NT, ND Extr: wwp, no edema  Lab Results:  No results for input(s): WBC, HGB, HCT, PLT in the last 72 hours. BMET No results for input(s): NA, K, CL, CO2, GLUCOSE, BUN, CREATININE, CALCIUM in the last 72 hours. PT/INR No results for input(s): LABPROT, INR in the last 72 hours. CMP     Component Value Date/Time   NA 135 07/15/2021 0829   K 4.0 07/15/2021 0829   CL 104 07/15/2021 0829   CO2 25 07/15/2021 0829   GLUCOSE 102 (H) 07/15/2021 0829   BUN 14 07/15/2021 0829   CREATININE 0.74 07/15/2021 0829   CALCIUM 9.1 07/15/2021 0829   PROT 6.5 06/15/2021 2240   ALBUMIN 3.6 06/15/2021 2240   AST 38 06/15/2021 2240   ALT 37 06/15/2021 2240   ALKPHOS 52 06/15/2021 2240   BILITOT 0.7 06/15/2021 2240   GFRNONAA >60 07/15/2021 0829   Lipase  No results found for: LIPASE  Studies/Results: No results found.  Anti-infectives: Anti-infectives (From admission, onward)    Start     Dose/Rate Route Frequency Ordered Stop   07/05/21 1200  ceFEPIme (MAXIPIME) 2 g in sodium chloride 0.9 % 100 mL IVPB        2 g 200 mL/hr over 30 Minutes Intravenous Every 8 hours  07/05/21 1052 07/11/21 1957   07/05/21 1130  ceFEPIme (MAXIPIME) 2 g in sodium chloride 0.9 % 100 mL IVPB  Status:  Discontinued        2 g 200 mL/hr over 30 Minutes Intravenous Every 12 hours 07/05/21 1042 07/05/21 1052   06/24/21 1800  ceFAZolin (ANCEF) IVPB 2g/100 mL premix        2 g 200 mL/hr over 30 Minutes Intravenous Every 8 hours 06/24/21 1116 06/28/21 1849   06/22/21 1100  ceFEPIme (MAXIPIME) 2 g in sodium chloride 0.9 % 100 mL IVPB  Status:  Discontinued        2 g 200 mL/hr over 30 Minutes Intravenous Every 8 hours 06/22/21 0957 06/24/21 1116        Assessment/Plan Found down SAH, IVH, parenchymal contusion - NSGY c/s, Dr. Kathyrn Sheriff, repeat [facility] 9/18 w/ progression of SAH. F/U CT H evolving contusions and decreased SAH, small B hygromas. Continue TBI team therapies. Scheduled klonopin to TID, continue BID seroquel. Ativan prn Skull fracture - NSGY c/s, Dr. Kathyrn Sheriff Nondisplaced facial fractures - ENT c/s, Dr. Claudia Desanctis, nonop, head of bed elevation to help with swelling, follow up outpatient Alcohol abuse - 186 on admission. CIWA Generalized lymphadenopthy - will need outpatient W/U ?CLL  Fall 10/17 - no signs of injury FEN - D3 diet VTE - SCDs, Lovenox ID - no current abx, afebrile Dispo -  4NP, PT/OT, SNF (has bed offer per TOC note).    LOS: 38 days    Randall Harvey , [facility] 07/23/2021, 9:41 AM Please see Amion for pager number during day hours 7:00am-4:30pm

## 2021-07-23 NOTE — TOC Progression Note (Addendum)
Transition of Care (TOC) - Progression Note    Patient Details  Name: Quientin Jent. MRN: [id number] Date of Birth: September 26, 1961  Transition of Care [facility]) CM/SW Contact  Oren Section Cleta Alberts, RN Phone Number: 07/23/2021, 2:38 PM  Clinical Narrative:    Multiple attempts made to reach patient's son Grayland Ormond to discuss SNF bed offers, without success.  Voicemail box is full, and unable to leave a message.  Spoke with patient's daughter, Estill Bamberg: She states she was unaware that her father is medically ready for discharge, and that her brother has not communicated the information about bed offers to her.  Bed offers given to Dominga Ferry, patient's daughter.  She states she will reach out to her brother and call me back.  I have communicated to daughter that there are no facilities in the [place] area that have accepted patient, and these are our only offers.  She verbalizes understanding of this.  Hopeful for family to make a decision on SNF bed ASAP.  1608 Addendum:  Patient's daughter Estill Bamberg called back; states she has spoken with her brother, and they are not satisfied with SNF bed offers.  Estill Bamberg states she is interested in a couple of facilities in the [place] area; she states she will email me the facility information and see if they will accept patients referral.  Stressed the importance of making a decision promptly, as patient is medically ready for discharge.   Expected Discharge Plan: [place] Barriers to Discharge: Continued Medical Work up  Expected Discharge Plan and Services Expected Discharge Plan: [place]   Discharge Planning Services: CM Consult Post Acute Care Choice: [place] Living arrangements for the past 2 months: Single Family Home                                       Social Determinants of Health (SDOH) Interventions    Readmission Risk Interventions No flowsheet data found.  Reinaldo Raddle,  RN, BSN  Trauma/Neuro ICU Case Manager [phone]

## 2021-07-23 NOTE — Discharge Instructions (Signed)
Your MRI incidentally showed Numerous prominent to enlarged bilateral cervical chain and supraclavicular lymph nodes. Please establish care with a primary care provider as an outpatient to have this worked up.

## 2021-07-24 MED ORDER — HALOPERIDOL LACTATE 5 MG/ML IJ SOLN
5.0000 mg | Freq: Once | INTRAMUSCULAR | Status: AC
  Administered 2021-07-24: 5 mg via INTRAVENOUS
  Filled 2021-07-24: qty 1
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
## 2021-07-24 NOTE — Progress Notes (Signed)
13 Days Post-Op  Subjective: CC: No acute changes overnight. Tolerating diet without n/v. BM documented on I/O yesterday. Voiding.   Objective: Vital signs in last 24 hours: Temp:  [97.5 F (36.4 C)-98 F (36.7 C)] 97.5 F (36.4 C) (10/26 0707) Pulse Rate:  [88-102] 101 (10/26 0707) Resp:  [15-20] 15 (10/26 0707) BP: (125-138)/(78-94) 135/86 (10/26 0707) SpO2:  [96 %-100 %] 96 % (10/26 0707) Weight:  [72.4 kg] 72.4 kg (10/26 0500) Last BM Date: 07/19/21  Intake/Output from previous day: 10/25 0701 - 10/26 0700 In: 640 [P.O.:640] Out: 1201 [Urine:1200; Stool:1] Intake/Output this shift: No intake/output data recorded.  PE: Gen: comfortable, no distress Neuro: non-focal exam, MAE HEENT: PERRL CV: regular  Pulm: unlabored breathing, CTA b/l Abd: soft, NT, ND, +BS Extr: wwp, no edema  Lab Results:  No results for input(s): WBC, HGB, HCT, PLT in the last 72 hours. BMET No results for input(s): NA, K, CL, CO2, GLUCOSE, BUN, CREATININE, CALCIUM in the last 72 hours. PT/INR No results for input(s): LABPROT, INR in the last 72 hours. CMP     Component Value Date/Time   NA 135 07/15/2021 0829   K 4.0 07/15/2021 0829   CL 104 07/15/2021 0829   CO2 25 07/15/2021 0829   GLUCOSE 102 (H) 07/15/2021 0829   BUN 14 07/15/2021 0829   CREATININE 0.74 07/15/2021 0829   CALCIUM 9.1 07/15/2021 0829   PROT 6.5 06/15/2021 2240   ALBUMIN 3.6 06/15/2021 2240   AST 38 06/15/2021 2240   ALT 37 06/15/2021 2240   ALKPHOS 52 06/15/2021 2240   BILITOT 0.7 06/15/2021 2240   GFRNONAA >60 07/15/2021 0829   Lipase  No results found for: LIPASE  Studies/Results: No results found.  Anti-infectives: Anti-infectives (From admission, onward)    Start     Dose/Rate Route Frequency Ordered Stop   07/05/21 1200  ceFEPIme (MAXIPIME) 2 g in sodium chloride 0.9 % 100 mL IVPB        2 g 200 mL/hr over 30 Minutes Intravenous Every 8 hours 07/05/21 1052 07/11/21 1957   07/05/21 1130   ceFEPIme (MAXIPIME) 2 g in sodium chloride 0.9 % 100 mL IVPB  Status:  Discontinued        2 g 200 mL/hr over 30 Minutes Intravenous Every 12 hours 07/05/21 1042 07/05/21 1052   06/24/21 1800  ceFAZolin (ANCEF) IVPB 2g/100 mL premix        2 g 200 mL/hr over 30 Minutes Intravenous Every 8 hours 06/24/21 1116 06/28/21 1849   06/22/21 1100  ceFEPIme (MAXIPIME) 2 g in sodium chloride 0.9 % 100 mL IVPB  Status:  Discontinued        2 g 200 mL/hr over 30 Minutes Intravenous Every 8 hours 06/22/21 0957 06/24/21 1116        Assessment/Plan Found down SAH, IVH, parenchymal contusion - NSGY c/s, Dr. Kathyrn Sheriff, repeat [facility] 9/18 w/ progression of SAH. F/U CT H evolving contusions and decreased SAH, small B hygromas. Continue TBI team therapies. Scheduled klonopin to TID, continue BID seroquel. Ativan prn Skull fracture - NSGY c/s, Dr. Kathyrn Sheriff Nondisplaced facial fractures - ENT c/s, Dr. Claudia Desanctis, nonop, head of bed elevation to help with swelling, follow up outpatient Alcohol abuse - 186 on admission. CIWA Generalized lymphadenopthy - will need outpatient W/U ?CLL  Fall 10/17 - no signs of injury FEN - D3 diet VTE - SCDs, Lovenox ID - no current abx, afebrile Dispo - 4NP, PT/OT, SNF. TOC note reviewed. Had bed  offers for [facility] and [facility]. Family wishes to eval options in [place] area. He is medically stable for discharge.    LOS: 39 days    Jillyn Ledger , [facility] 07/24/2021, 8:27 AM Please see Amion for pager number during day hours 7:00am-4:30pm
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
## 2021-07-24 NOTE — Progress Notes (Signed)
Occupational Therapy Treatment Patient Details Name: Randall Harvey. MRN: [id number] DOB: [birth year] Today's Date: 07/24/2021   History of present illness Pt is 60 yo male found down and inebriated with blood from L ear. Found to have L SAH and L temporal bone, L sphenoid, L maxillary fxs. Intubated 9/22, PT signed off and was reordered. xray L knee shows Pronounced worsening of degenerative disease of the medial  compartment, questionable baker's cyst, small joint effusion. xray LLE shows baker cyst PMH: COPD, HTN, PE, renal cell carcinoma, CVA, thrombocytopenia.   OT comments  Pt with extreme knee pain this session bil joints. Pt denies gout hx and does not feel warm to touch. Pt however goes from no pain to instant sudden buckle pain with transfers. Pt with heat applied per patient request and ace wraps to hold the heat in place. Pt with improved L hand 3rd 4th 5th digit movement into flexion with deficits noted at 2nd digit. Pt with some arthritis changes noted at this joint as well. Recommendation remains SNF  Cell phone found broken on arrival and pt using a knife to try to remove from case. OT assisting pt to look over phone. Phone placed on shelf at this time. Pt upset about phone damage and inability to drive to buy a new one.    Recommendations for follow up therapy are one component of a multi-disciplinary discharge planning process, led by the attending physician.  Recommendations may be updated based on patient status, additional functional criteria and insurance authorization.    Follow Up Recommendations  Skilled nursing-short term rehab (<3 hours/day)    Assistance Recommended at Discharge Frequent or constant Supervision/Assistance  Equipment Recommendations  BSC;Wheelchair (measurements OT);Wheelchair cushion (measurements OT);Hospital bed    Recommendations for Other Services Speech consult    Precautions / Restrictions Precautions Precautions: Fall Restrictions Weight  Bearing Restrictions: No       Mobility Bed Mobility Overal bed mobility: Needs Assistance Bed Mobility: Supine to Sit;Sit to Supine Rolling: Supervision Sidelying to sit: Supervision Supine to sit: Supervision Sit to supine: Supervision   General bed mobility comments: mod assist for trunk elevation, LE translation to EOB, return to supine, and boost up in bed.    Transfers Overall transfer level: Needs assistance Equipment used: Rolling walker (2 wheels) Transfers: Sit to/from Stand Sit to Stand: Min assist           General transfer comment: pt with sudden changes in pain that affect balance     Balance Overall balance assessment: Needs assistance Sitting-balance support: Bilateral upper extremity supported;Feet supported Sitting balance-Leahy Scale: Fair Sitting balance - Comments: Reaching onto the floor to pick up a scrap, "let me get that dollar bill" needing Mod A to prevent fall.   Standing balance support: Bilateral upper extremity supported;During functional activity;Reliant on assistive device for balance Standing balance-Leahy Scale: Poor Standing balance comment: reliant on external assist, very high fall risk                           ADL either performed or assessed with clinical judgement   ADL Overall ADL's : Needs assistance/impaired                         Toilet Transfer: Minimal assistance;Grab bars;Stand-pivot   Toileting- Clothing Manipulation and Hygiene: Min guard;Sit to/from stand       Functional mobility during ADLs: Maximal assistance;Rolling walker (2 wheels) General  ADL Comments: pt reports extreme pain bil knees. ace wraps applied per patients request. pt reports "OH THEY POPPED" "oh they feel like they are shifting" pt walking normally then with sudden onset yelling and buckles of pain. pt when asked to flex and extend knee able but when weight bearing will have sudden changes in pain     Vision        Perception     Praxis      Cognition Arousal/Alertness: Awake/alert Behavior During Therapy: Impulsive;Restless Overall Cognitive Status: Impaired/Different from baseline Area of Impairment: Attention;Memory;Following commands;Safety/judgement;Awareness;Problem solving               [place]ancho Levels of Cognitive Functioning [place] Los Amigos Scales of Cognitive Functioning: Confused/appropriate Orientation Level: Disoriented to;Time;Situation Current Attention Level: Sustained Memory: Decreased short-term memory Following Commands: Follows one step commands with increased time;Follows multi-step commands inconsistently Safety/Judgement: Decreased awareness of safety;Decreased awareness of deficits Awareness: Emergent Problem Solving: Slow processing General Comments: crying at start of session. pt upset phone is broken. pt was trying use knife to "fix the phone"   [place] of Cognitive Functioning: Confused/appropriate      Exercises     Shoulder Instructions       General Comments      Pertinent Vitals/ Pain       Pain Assessment: Faces Pain Score: 10-Worst pain ever Faces Pain Scale: Hurts even more Breathing: normal Negative Vocalization: none Facial Expression: facial grimacing Body Language: rigid, fists clenched, knees up, pushing/pulling away, strikes out Pain Location: bil knees Pain Descriptors / Indicators: Grimacing;Discomfort Pain Intervention(s): Monitored during session;Premedicated before session;Repositioned;Heat applied  Home Living                                          Prior Functioning/Environment              Frequency  Min 2X/week        Progress Toward Goals  OT Goals(current goals can now be found in the care plan section)  Progress towards OT goals: Progressing toward goals  ADL Goals Pt Will Perform Grooming: with min guard assist;sitting Pt Will Perform Upper Body Bathing: with min  assist;sitting Pt Will Transfer to Toilet: with mod assist;ambulating;bedside commode Additional ADL Goal #1: pt will follow 2 step command 75% of session  Plan Discharge plan remains appropriate    Co-evaluation                 AM-PAC OT "6 Clicks" Daily Activity     Outcome Measure   Help from another person eating meals?: A Little Help from another person taking care of personal grooming?: A Little Help from another person toileting, which includes using toliet, bedpan, or urinal?: A Little Help from another person bathing (including washing, rinsing, drying)?: A Little Help from another person to put on and taking off regular upper body clothing?: A Little Help from another person to put on and taking off regular lower body clothing?: A Lot 6 Click Score: 17    End of Session Equipment Utilized During Treatment: Gait belt;Rolling walker (2 wheels)  OT Visit Diagnosis: Unsteadiness on feet (R26.81);Muscle weakness (generalized) (M62.81);Pain Pain - Right/Left: Left Pain - part of body: Leg   Activity Tolerance Patient limited by pain   Patient Left in bed;with call bell/phone within reach;with bed alarm set   Nurse Communication Mobility status;Precautions  Time: 7342-8768 OT Time Calculation (min): 28 min  Charges: OT General Charges $OT Visit: 1 Visit OT Treatments $Self Care/Home Management : 23-37 mins   Brynn, OTR/L  Acute Rehabilitation Services Pager: [phone] Office: [phone] .   Jeri Modena 07/24/2021, 4:58 PM

## 2021-07-24 NOTE — Progress Notes (Signed)
Physical Therapy Treatment Patient Details Name: Randall Harvey. MRN: [id number] DOB: [birth year] Today's Date: 07/24/2021   History of Present Illness Pt is 60 yo male found down and inebriated with blood from L ear. Found to have L SAH and L temporal bone, L sphenoid, L maxillary fxs. Intubated 9/22, PT signed off and was reordered. xray L knee shows Pronounced worsening of degenerative disease of the medial  compartment, questionable baker's cyst, small joint effusion. xray LLE shows baker cyst PMH: COPD, HTN, PE, renal cell carcinoma, CVA, thrombocytopenia.    PT Comments    Pt more tearful and upset this session, bringing up incident that "crushed my skull in", his family not visiting him, and bilat knee pain. Pt ambulatory to/from bathroom only, declines further mobility due to bilat knee pain, RN notified pt requesting pain medication. Pt following one-step commands today, but is still impulsive and lacks insight into safety and deficits. PT to continue to follow.      Recommendations for follow up therapy are one component of a multi-disciplinary discharge planning process, led by the attending physician.  Recommendations may be updated based on patient status, additional functional criteria and insurance authorization.  Follow Up Recommendations  Skilled nursing-short term rehab (<3 hours/day)     Assistance Recommended at Discharge Frequent or constant Supervision/Assistance  Equipment Recommendations  Rolling walker (2 wheels)    Recommendations for Other Services OT consult     Precautions / Restrictions Precautions Precautions: Fall Restrictions Weight Bearing Restrictions: No     Mobility  Bed Mobility Overal bed mobility: Needs Assistance Bed Mobility: Supine to Sit;Sit to Supine   Sidelying to sit: Mod assist Supine to sit: Min assist     General bed mobility comments: mod assist for trunk elevation, LE translation to EOB, return to supine, and boost up in  bed.    Transfers Overall transfer level: Needs assistance Equipment used: Rolling walker (2 wheels) Transfers: Sit to/from Stand Sit to Stand: Min assist           General transfer comment: min assist to boost and steady, STS x3 from EOB x2 and toilet x1.    Ambulation/Gait Ambulation/Gait assistance: Min assist Gait Distance (Feet): 25 Feet (to and from toilet) Assistive device: Rolling walker (2 wheels) Gait Pattern/deviations: Step-through pattern;Decreased stride length;Shuffle;Trunk flexed;Narrow base of support Gait velocity: decr   General Gait Details: light steadying assist, RW management and maneuvering. MAX cues for staying inside RW and keeping RW close to self. Pt crying out in pain over knee pain while up.   Stairs             Wheelchair Mobility    Modified Rankin (Stroke Patients Only)       Balance Overall balance assessment: Needs assistance Sitting-balance support: Feet supported;No upper extremity supported Sitting balance-Leahy Scale: Good Sitting balance - Comments: Reaching onto the floor to pick up a scrap, "let me get that dollar bill" needing Mod A to prevent fall.   Standing balance support: During functional activity Standing balance-Leahy Scale: Poor Standing balance comment: reliant on external assist, very high fall risk                            Cognition Arousal/Alertness: Awake/alert Behavior During Therapy: Impulsive;Restless Overall Cognitive Status: Impaired/Different from baseline Area of Impairment: Attention;Memory;Following commands;Safety/judgement;Awareness;Problem solving               [place] Levels of Cognitive Functioning [place]  Los Amigos Scales of Cognitive Functioning: Confused/appropriate   Current Attention Level: Sustained Memory: Decreased short-term memory Following Commands: Follows one step commands with increased time;Follows multi-step commands inconsistently Safety/Judgement:  Decreased awareness of safety;Decreased awareness of deficits Awareness: Emergent Problem Solving: Slow processing;Requires verbal cues;Requires tactile cues;Difficulty sequencing General Comments: Pt labile today, crying on and off throughout session about his family not visiting, knee pain, reason for admission. Pt moving impulsively, requires cues to wait for PT assist.   [facility] Scales of Cognitive Functioning: Confused/appropriate    Exercises      General Comments        Pertinent Vitals/Pain Pain Assessment: Faces Faces Pain Scale: Hurts even more Pain Location: bilat knees L>R Pain Descriptors / Indicators: Grimacing;Discomfort Pain Intervention(s): Limited activity within patient's tolerance;Monitored during session;Repositioned    Home Living                          Prior Function            PT Goals (current goals can now be found in the care plan section) Acute Rehab PT Goals Patient Stated Goal: go home today PT Goal Formulation: Patient unable to participate in goal setting Time For Goal Achievement: 08/05/21 Potential to Achieve Goals: Fair Progress towards PT goals: Progressing toward goals    Frequency    Min 3X/week      PT Plan Current plan remains appropriate    Co-evaluation              AM-PAC PT "6 Clicks" Mobility   Outcome Measure  Help needed turning from your back to your side while in a flat bed without using bedrails?: A Little Help needed moving from lying on your back to sitting on the side of a flat bed without using bedrails?: A Lot Help needed moving to and from a bed to a chair (including a wheelchair)?: A Lot Help needed standing up from a chair using your arms (e.g., wheelchair or bedside chair)?: A Little Help needed to walk in hospital room?: A Little Help needed climbing 3-5 steps with a railing? : A Lot 6 Click Score: 15    End of Session Equipment Utilized During Treatment: Gait  belt Activity Tolerance: Patient limited by pain Patient left: in bed;with call bell/phone within reach;Other (comment) (OT dovetailing PT session, bed alarm to be set by OT) Nurse Communication: Mobility status PT Visit Diagnosis: Other abnormalities of gait and mobility (R26.89);Pain Pain - Right/Left: Left (L>R) Pain - part of body: Knee     Time: 5170-0174 PT Time Calculation (min) (ACUTE ONLY): 20 min  Charges:  $Gait Training: 8-22 mins                    Stacie Glaze, PT DPT Acute Rehabilitation Services Pager [phone]  Office [phone]   Roxine Caddy E Ruffin Pyo 07/24/2021, 3:16 PM

## 2021-07-24 NOTE — TOC Progression Note (Addendum)
Transition of Care (TOC) - Progression Note    Patient Details  Name: Randall Harvey. MRN: [id number] Date of Birth: [birth year]  [facility]) CM/SW Contact  Ella Bodo, RN Phone Number: 07/24/2021, 12:14 PM  Clinical Narrative:    Damaris Schooner with Estill Bamberg, patient's daughter, regarding potential facilities in [place] area.  She states they are in the process of calling facilities, and will have a list emailed to me by this afternoon.  I again stressed the importance of making a decision, as patient is medically stable for discharge.  Will follow up this afternoon.   Addendum: 4239 I have yet to receive list of preferred facilities from patient's daughter.  Should list not be received by tomorrow AM, family will have to make decision on SNF by tomorrow, as patient is medically stable and appropriate for dc.     Expected Discharge Plan: [place] Barriers to Discharge: Continued Medical Work up  Expected Discharge Plan and Services Expected Discharge Plan: [place]   Discharge Planning Services: CM Consult Post Acute Care Choice: [place] Living arrangements for the past 2 months: Single Family Home                                       Social Determinants of Health (SDOH) Interventions    Readmission Risk Interventions No flowsheet data found.  Reinaldo Raddle, RN, BSN  Trauma/Neuro ICU Case Manager [phone]

## 2021-07-25 ENCOUNTER — Inpatient Hospital Stay (HOSPITAL_COMMUNITY): Payer: Medicare Other

## 2021-07-25 MED ORDER — WHITE PETROLATUM EX OINT
TOPICAL_OINTMENT | CUTANEOUS | Status: AC
  Administered 2021-07-25: 0.2
  Filled 2021-07-25: qty 28.35

## 2021-07-25 MED ORDER — MUSCLE RUB 10-15 % EX CREA
TOPICAL_CREAM | CUTANEOUS | Status: DC | PRN
  Filled 2021-07-25: qty 85

## 2021-07-25 NOTE — Progress Notes (Signed)
Occupational Therapy Treatment Patient Details Name: Randall Harvey. MRN: [id number] DOB: 03-07-1961 Today's Date: 07/25/2021   History of present illness Pt is 60 yo male found down and inebriated with blood from L ear. Found to have L SAH and L temporal bone, L sphenoid, L maxillary fxs. Intubated 9/22, PT signed off and was reordered. xray L knee shows Pronounced worsening of degenerative disease of the medial  compartment, questionable baker's cyst, small joint effusion. xray LLE shows baker cyst PMH: COPD, HTN, PE, renal cell carcinoma, CVA, thrombocytopenia.   OT comments   Pt demonstrates [place] V level with strong desire to d/c today and making phone calls after session on hospital phone. Pt reports "what time are you getting off? " "Randall Harvey ill be gone by then" pt redirected several times that he is not pending d/c at this time and pt states he has a ride. Recommendation for SNF. Pt now dressed as pt was found in the hall with RW without any assistance on arrival.     Recommendations for follow up therapy are one component of a multi-disciplinary discharge planning process, led by the attending physician.  Recommendations may be updated based on patient status, additional functional criteria and insurance authorization.    Follow Up Recommendations  Skilled nursing-short term rehab (<3 hours/day)    Assistance Recommended at Discharge Frequent or constant Supervision/Assistance  Equipment Recommendations  BSC;Wheelchair (measurements OT);Wheelchair cushion (measurements OT);Hospital bed    Recommendations for Other Services Speech consult    Precautions / Restrictions Precautions Precautions: Fall       Mobility Bed Mobility Overal bed mobility: Needs Assistance Bed Mobility: Supine to Sit Rolling: Supervision   Supine to sit: Supervision Sit to supine: Supervision        Transfers Overall transfer level: Needs assistance Equipment used: Rolling walker (2  wheels) Transfers: Sit to/from Stand Sit to Stand: Min assist           General transfer comment: pt pushign RW too far ahead and needs redirection     Balance                                           ADL either performed or assessed with clinical judgement   ADL Overall ADL's : Needs assistance/impaired                 Upper Body Dressing : Minimal assistance;Sitting   Lower Body Dressing: Moderate assistance;Sit to/from stand Lower Body Dressing Details (indicate cue type and reason): don paper scrubs             Functional mobility during ADLs: Moderate assistance;Rolling walker (2 wheels) General ADL Comments: pt found in hall half dressedand expressing need to leave. pt redirected back to the room. pt noted to have large amount of cream applied to bil LE for discomfort at knees     Vision       Perception     Praxis      Cognition Arousal/Alertness: Awake/alert Behavior During Therapy: Impulsive;Restless Overall Cognitive Status: Impaired/Different from baseline Area of Impairment: Attention;Memory;Following commands;Safety/judgement;Awareness;Problem solving               [place]anch[place] Levels of Cognitive Functioning [place] Los Amigos Scales of Cognitive Functioning: Confused/inappropriate/non-agitated Orientation Level: Disoriented to;Time;Situation Current Attention Level: Sustained Memory: Decreased short-term memory Following Commands: Follows one step commands with increased time;Follows multi-step commands  inconsistently Safety/Judgement: Decreased awareness of safety;Decreased awareness of deficits Awareness: Emergent Problem Solving: Slow processing General Comments: reports he is leaving today. pt in hall with buttock showing and lacks awareness. pt (A) back to room for dressing in paper scrubs   [place] [employer] Scales of Cognitive Functioning: Confused/inappropriate/non-agitated      Exercises     Shoulder  Instructions       General Comments      Pertinent Vitals/ Pain       Pain Assessment: Faces Pain Score: 7  Pain Location: bil knees Pain Descriptors / Indicators: Grimacing;Discomfort  Home Living                                          Prior Functioning/Environment              Frequency  Min 2X/week        Progress Toward Goals  OT Goals(current goals can now be found in the care plan section)  Progress towards OT goals: Progressing toward goals  ADL Goals Pt Will Perform Grooming: with min guard assist;sitting Pt Will Perform Upper Body Bathing: with min assist;sitting Pt Will Transfer to Toilet: ambulating;bedside commode;with min assist Additional ADL Goal #1: pt will follow 2 step command 75% of session  Plan Discharge plan remains appropriate    Co-evaluation                 AM-PAC OT "6 Clicks" Daily Activity     Outcome Measure   Help from another person eating meals?: A Little Help from another person taking care of personal grooming?: A Little Help from another person toileting, which includes using toliet, bedpan, or urinal?: A Little Help from another person bathing (including washing, rinsing, drying)?: A Little Help from another person to put on and taking off regular upper body clothing?: A Little Help from another person to put on and taking off regular lower body clothing?: A Lot 6 Click Score: 17    End of Session Equipment Utilized During Treatment: Rolling walker (2 wheels)  OT Visit Diagnosis: Unsteadiness on feet (R26.81);Muscle weakness (generalized) (M62.81);Pain Pain - Right/Left: Left Pain - part of body: Leg   Activity Tolerance Patient tolerated treatment well   Patient Left in bed;with call bell/phone within reach;with bed alarm set   Nurse Communication Mobility status;Precautions        Time: 4585-9292 OT Time Calculation (min): 8 min  Charges: OT General Charges $OT Visit: 1 Visit OT  Treatments $Self Care/Home Management : 8-22 mins   Randall Harvey, OTR/L  Acute Rehabilitation Services Pager: [phone] Office: [phone] .   Randall Harvey 07/25/2021, 4:15 PM

## 2021-07-25 NOTE — Progress Notes (Signed)
Speech Language Pathology Treatment: Dysphagia;Cognitive-Linquistic  Patient Details Name: Randall Harvey. MRN: [id number] DOB: 1961-05-07 Today's Date: 07/25/2021 Time: 7253-6644 SLP Time Calculation (min) (ACUTE ONLY): 21 min  Assessment / Plan / Recommendation Clinical Impression  Pt was seen for therapy targeting swallowing and cognition goals. SLP aided pt in calling partner during session. Pt cognition improving overall with orientation x4, however sustained attention moderately-severely impaired. To complete phone call task, pt required constant redirection and reminder to follow hospital phone instructions. Pt problem solving improving, requiring one verbal cue to move remote out of the bed. Once pt attention is redirected, pt able to return to task with relative ease. Pt attention deficits c/b impulsivity re: topic discussion, speaking rapidly about interests and events that have recently taken place over task completion. SLP observed pt with thin liquid, noting no oral phase impairment and no overt s/s of penetration or aspiration. Recommend regular/thin diet d/t pt increased alertness. SLP will continue to follow for management of diet tolerance and therapy targeting sustained attention.    HPI HPI: Pt is 60 yo male found down and inebriated with blood from L ear. Found to have L SAH and L temporal bone, L sphenoid, L maxillary fxs. PMH: COPD, HTN, PE, renal cell carcinoma, CVA, thrombocytopenia. Intubated 9/22-10/4      SLP Plan  Continue with current plan of care      Recommendations for follow up therapy are one component of a multi-disciplinary discharge planning process, led by the attending physician.  Recommendations may be updated based on patient status, additional functional criteria and insurance authorization.    Recommendations  Diet recommendations: Thin liquid;Dysphagia 3 (mechanical soft) Liquids provided via: Cup;Straw Medication Administration: Whole meds with  puree Supervision: Full supervision/cueing for compensatory strategies;Staff to assist with self feeding Compensations: Minimize environmental distractions;Slow rate;Small sips/bites Postural Changes and/or Swallow Maneuvers: Seated upright 90 degrees                General recommendations: Rehab consult Oral Care Recommendations: Oral care BID Follow up Recommendations: Inpatient Rehab SLP Visit Diagnosis: Dysphagia, unspecified (R13.10);Cognitive communication deficit [phone]) Plan: Continue with current plan of care       GO              Dewitt Rota, SLP-Student   Dewitt Rota  07/25/2021, 1:22 PM

## 2021-07-25 NOTE — Progress Notes (Signed)
14 Days Post-Op  Subjective: CC: No acute changes overnight. Tolerating diet without n/v. Last BM 10/26. Per PT/OT notes, he has been having b/l knee pain and L hand pain. When asked about this he does complain of L hand pain that is worse when making a first. It is diffuse and not in one location. He complains of knee pain that is on the medial aspect of his left knee when he ambulates. He had xrays of the L knee on 10/21  Objective: Vital signs in last 24 hours: Temp:  [97.4 F (36.3 C)-98.5 F (36.9 C)] 98.5 F (36.9 C) (10/27 0757) Pulse Rate:  [90-106] 106 (10/27 0757) Resp:  [16-20] 18 (10/27 0757) BP: (106-132)/(75-93) 117/86 (10/27 0757) SpO2:  [93 %-98 %] 93 % (10/27 0757) Weight:  [74.6 kg] 74.6 kg (10/27 0500) Last BM Date: 07/24/21  Intake/Output from previous day: 10/26 0701 - 10/27 0700 In: 240 [P.O.:240] Out: 700 [Urine:700] Intake/Output this shift: No intake/output data recorded.  PE: Gen: comfortable, no distress Neuro: non-focal exam, MAE HEENT: PERRL CV: regular  Pulm: unlabored breathing, CTA b/l Abd: soft, NT, ND, +BS Extr: wwp, no edema. L hand with no gross swelling. Reports pain when attempting to make a fist. He reports generalized pain with palpation to any area of his hand without point tenderness over one area. He denies any palpation to the wrist. R knee NT to palpation. Able rom. No gross swelling. L knee with some medial joint line ttp but otherwise nt. Able tom. Seen ambulating with walker in hallways.   Lab Results:  No results for input(s): WBC, HGB, HCT, PLT in the last 72 hours. BMET No results for input(s): NA, K, CL, CO2, GLUCOSE, BUN, CREATININE, CALCIUM in the last 72 hours. PT/INR No results for input(s): LABPROT, INR in the last 72 hours. CMP     Component Value Date/Time   NA 135 07/15/2021 0829   K 4.0 07/15/2021 0829   CL 104 07/15/2021 0829   CO2 25 07/15/2021 0829   GLUCOSE 102 (H) 07/15/2021 0829   BUN 14  07/15/2021 0829   CREATININE 0.74 07/15/2021 0829   CALCIUM 9.1 07/15/2021 0829   PROT 6.5 06/15/2021 2240   ALBUMIN 3.6 06/15/2021 2240   AST 38 06/15/2021 2240   ALT 37 06/15/2021 2240   ALKPHOS 52 06/15/2021 2240   BILITOT 0.7 06/15/2021 2240   GFRNONAA >60 07/15/2021 0829   Lipase  No results found for: LIPASE  Studies/Results: No results found.  Anti-infectives: Anti-infectives (From admission, onward)    Start     Dose/Rate Route Frequency Ordered Stop   07/05/21 1200  ceFEPIme (MAXIPIME) 2 g in sodium chloride 0.9 % 100 mL IVPB        2 g 200 mL/hr over 30 Minutes Intravenous Every 8 hours 07/05/21 1052 07/11/21 1957   07/05/21 1130  ceFEPIme (MAXIPIME) 2 g in sodium chloride 0.9 % 100 mL IVPB  Status:  Discontinued        2 g 200 mL/hr over 30 Minutes Intravenous Every 12 hours 07/05/21 1042 07/05/21 1052   06/24/21 1800  ceFAZolin (ANCEF) IVPB 2g/100 mL premix        2 g 200 mL/hr over 30 Minutes Intravenous Every 8 hours 06/24/21 1116 06/28/21 1849   06/22/21 1100  ceFEPIme (MAXIPIME) 2 g in sodium chloride 0.9 % 100 mL IVPB  Status:  Discontinued        2 g 200 mL/hr over 30 Minutes Intravenous  Every 8 hours 06/22/21 0957 06/24/21 1116        Assessment/Plan Found down SAH, IVH, parenchymal contusion - NSGY c/s, Dr. Kathyrn Sheriff, repeat [facility] 9/18 w/ progression of SAH. F/U CT H evolving contusions and decreased SAH, small B hygromas. Continue TBI team therapies. Scheduled klonopin to TID, continue BID seroquel. Ativan prn Skull fracture - NSGY c/s, Dr. Kathyrn Sheriff Nondisplaced facial fractures - ENT c/s, Dr. Claudia Desanctis, nonop, head of bed elevation to help with swelling, follow up outpatient Alcohol abuse - 186 on admission. CIWA Generalized lymphadenopthy - will need outpatient W/U ?CLL  Fall 10/17 - no signs of injury L hand pain - xray today L knee pain - xray negative for fracture  FEN - D3 diet VTE - SCDs, Lovenox ID - no current abx, afebrile Dispo - 4NP,  PT/OT, SNF. TOC note reviewed. Had bed offers for [facility] and [facility]. Family wishes to eval options in [place] area. He is medically stable for discharge.    LOS: 40 days    Jillyn Ledger , [facility] Surgery 07/25/2021, 8:58 AM Please see Amion for pager number during day hours 7:00am-4:30pm

## 2021-07-25 NOTE — TOC Progression Note (Signed)
Transition of Care (TOC) - Progression Note    Patient Details  Name: Randall Harvey. MRN: [id number] Date of Birth: [birth year]  Transition of Care [facility]) CM/SW Contact  Ella Bodo, RN Phone Number: 07/25/2021, 1010am  Clinical Narrative:    Spoke with patient's daughter, Estill Bamberg, regarding bed offers and disposition.  Estill Bamberg states they are still not satisfied with current bed offers, and would like to see patient placed in the [place] area.  She has emailed trauma case manager a list of potential facilities in this area.  Addendum: 1700 Left multiple messages with [place] SNF facilities, and faxed referrals to multiple facilities in this area.  Will provide updates to family and team as they are available.   Expected Discharge Plan: [place] Barriers to Discharge: Continued Medical Work up  Expected Discharge Plan and Services Expected Discharge Plan: [place]   Discharge Planning Services: CM Consult Post Acute Care Choice: [place] Living arrangements for the past 2 months: Single Family Home                                       Social Determinants of Health (SDOH) Interventions    Readmission Risk Interventions No flowsheet data found.  Reinaldo Raddle, RN, BSN  Trauma/Neuro ICU Case Manager [phone]

## 2021-07-26 ENCOUNTER — Inpatient Hospital Stay (HOSPITAL_COMMUNITY): Payer: Medicare Other

## 2021-07-26 MED ORDER — HALOPERIDOL LACTATE 5 MG/ML IJ SOLN
5.0000 mg | Freq: Three times a day (TID) | INTRAMUSCULAR | Status: DC | PRN
  Administered 2021-07-27: 5 mg via INTRAVENOUS
  Filled 2021-07-26: qty 1

## 2021-07-26 MED ORDER — HALOPERIDOL LACTATE 5 MG/ML IJ SOLN
5.0000 mg | Freq: Once | INTRAMUSCULAR | Status: AC
  Administered 2021-07-26: 5 mg via INTRAVENOUS
  Filled 2021-07-26: qty 1

## 2021-07-26 NOTE — Progress Notes (Signed)
Pt irritable, anxious and non compliant, wandering in hall way attempting to go into other patient's room. Scheduled anti-anxiety and prn meds given with little to no effect. MD notified order for Haldol  iv  x 1. Order initiated.

## 2021-07-26 NOTE — Progress Notes (Signed)
Physical Therapy Treatment Patient Details Name: Randall Harvey. MRN: [id number] DOB: 30-Jul-1961 Today's Date: 07/26/2021   History of Present Illness Pt is 60 yo male found down and inebriated with blood from L ear. Found to have L SAH and L temporal bone, L sphenoid, L maxillary fxs. Intubated 9/22, PT signed off and was reordered. xray L knee shows Pronounced worsening of degenerative disease of the medial  compartment, questionable baker's cyst, small joint effusion. xray LLE shows baker cyst PMH: COPD, HTN, PE, renal cell carcinoma, CVA, thrombocytopenia.    PT Comments    Pt restless upon arrival to room, eager to walk. Pt overall requiring min assist for bed mobility, repeated transfers, and short-distance hallway gait, overall distance limited by pt fatigue and bilat knee pain although pain appears better today vs last session. PT ready for d/c for SNF from a PT perspective, awaiting placement.     Recommendations for follow up therapy are one component of a multi-disciplinary discharge planning process, led by the attending physician.  Recommendations may be updated based on patient status, additional functional criteria and insurance authorization.  Follow Up Recommendations  Skilled nursing-short term rehab (<3 hours/day)     Assistance Recommended at Discharge Frequent or constant Supervision/Assistance  Equipment Recommendations  Rolling walker (2 wheels)    Recommendations for Other Services OT consult     Precautions / Restrictions Precautions Precautions: Fall Restrictions Weight Bearing Restrictions: No     Mobility  Bed Mobility Overal bed mobility: Needs Assistance Bed Mobility: Supine to Sit;Sit to Supine     Supine to sit: Min assist Sit to supine: Min assist   General bed mobility comments: min assist for trunk elevation and lowering, cues for scooting to/from EOB. Pt able to pull self up in bed with HOB flat and bedrails.    Transfers Overall  transfer level: Needs assistance Equipment used: Rolling walker (2 wheels) Transfers: Sit to/from Stand Sit to Stand: Min assist           General transfer comment: min assist for power up, steadying, transitioning from stand>sit safely as pt sits down too early and must have assist to bring buttocks onto bed. STS x2, from EOB and chair in hallway.    Ambulation/Gait Ambulation/Gait assistance: Min assist Gait Distance (Feet): 20 Feet (x2) Assistive device: Rolling walker (2 wheels) Gait Pattern/deviations: Step-through pattern;Decreased stride length;Shuffle;Trunk flexed;Wide base of support Gait velocity: decr   General Gait Details: steadying and RW assist, cues for placement in RW (not followed). Pt needing seated rest break at halfway point of gait. Pt refuses further distance because PT would not provide pt with a cigarette.   Stairs             Wheelchair Mobility    Modified Rankin (Stroke Patients Only)       Balance Overall balance assessment: Needs assistance Sitting-balance support: Bilateral upper extremity supported;Feet supported Sitting balance-Leahy Scale: Fair     Standing balance support: Bilateral upper extremity supported;During functional activity;Reliant on assistive device for balance Standing balance-Leahy Scale: Poor                              Cognition Arousal/Alertness: Awake/alert Behavior During Therapy: Impulsive;Restless Overall Cognitive Status: Impaired/Different from baseline Area of Impairment: Attention;Memory;Following commands;Safety/judgement;Awareness;Problem solving               Rancho Levels of Cognitive Functioning Ranch[place] Los Amigos Scales of Cognitive Functioning: Confused/inappropriate/non-agitated  Current Attention Level: Sustained Memory: Decreased short-term memory Following Commands: Follows one step commands with increased time;Follows multi-step commands  inconsistently Safety/Judgement: Decreased awareness of safety;Decreased awareness of deficits Awareness: Emergent Problem Solving: Slow processing;Requires verbal cues;Requires tactile cues General Comments: very poor safety awareness, does not apply cues for safe RW use. Pt continues to make sexually inappropriate comments during session, responds well to redirection.   Rancho [employer] Scales of Cognitive Functioning: Confused/inappropriate/non-agitated    Exercises General Exercises - Lower Extremity Long Arc Quad: AAROM;Both;5 reps;Seated    General Comments        Pertinent Vitals/Pain Pain Assessment: Faces Faces Pain Scale: Hurts little more Pain Location: bilat knees Pain Descriptors / Indicators: Grimacing;Discomfort Pain Intervention(s): Limited activity within patient's tolerance;Monitored during session;Repositioned    Home Living                          Prior Function            PT Goals (current goals can now be found in the care plan section) Acute Rehab PT Goals Patient Stated Goal: go home today PT Goal Formulation: Patient unable to participate in goal setting Time For Goal Achievement: 08/05/21 Potential to Achieve Goals: Fair Progress towards PT goals: Progressing toward goals    Frequency    Min 3X/week      PT Plan Current plan remains appropriate    Co-evaluation              AM-PAC PT "6 Clicks" Mobility   Outcome Measure  Help needed turning from your back to your side while in a flat bed without using bedrails?: A Little Help needed moving from lying on your back to sitting on the side of a flat bed without using bedrails?: A Little Help needed moving to and from a bed to a chair (including a wheelchair)?: A Little Help needed standing up from a chair using your arms (e.g., wheelchair or bedside chair)?: A Little Help needed to walk in hospital room?: A Little Help needed climbing 3-5 steps with a railing? : A  Lot 6 Click Score: 17    End of Session Equipment Utilized During Treatment: Gait belt Activity Tolerance: Patient limited by pain Patient left: in bed;with call bell/phone within reach;with bed alarm set;with nursing/sitter in room Nurse Communication: Mobility status PT Visit Diagnosis: Other abnormalities of gait and mobility (R26.89);Pain Pain - Right/Left: Left (L>R) Pain - part of body: Knee     Time: 5329-9242 PT Time Calculation (min) (ACUTE ONLY): 12 min  Charges:  $Gait Training: 8-22 mins                     Stacie Glaze, PT DPT Acute Rehabilitation Services Pager [phone]  Office [phone]    Louis Matte 07/26/2021, 3:48 PM

## 2021-07-26 NOTE — Progress Notes (Signed)
15 Days Post-Op  Subjective: CC: Reviewed RN notes. Given haldol last night. Sleepy this morning but awakes and opens eyes to verbal commands and MAE's.   Objective: Vital signs in last 24 hours: Temp:  [97.6 F (36.4 C)-98.3 F (36.8 C)] 97.7 F (36.5 C) (10/28 0000) Pulse Rate:  [94-120] 94 (10/28 0000) Resp:  [18-20] 18 (10/28 0000) BP: (124-133)/(78-91) 124/82 (10/28 0000) SpO2:  [97 %-99 %] 99 % (10/28 0000) Weight:  [71.6 kg] 71.6 kg (10/28 0500) Last BM Date: 07/24/21  Intake/Output from previous day: 10/27 0701 - 10/28 0700 In: 600 [P.O.:600] Out: -  Intake/Output this shift: No intake/output data recorded.  PE: Gen: comfortable, no distress Neuro: non-focal exam, MAE HEENT: PERRL CV: regular  Pulm: unlabored breathing, CTA b/l Abd: soft, NT, ND, +BS Extr: No LE edema  Lab Results:  No results for input(s): WBC, HGB, HCT, PLT in the last 72 hours. BMET No results for input(s): NA, K, CL, CO2, GLUCOSE, BUN, CREATININE, CALCIUM in the last 72 hours. PT/INR No results for input(s): LABPROT, INR in the last 72 hours. CMP     Component Value Date/Time   NA 135 07/15/2021 0829   K 4.0 07/15/2021 0829   CL 104 07/15/2021 0829   CO2 25 07/15/2021 0829   GLUCOSE 102 (H) 07/15/2021 0829   BUN 14 07/15/2021 0829   CREATININE 0.74 07/15/2021 0829   CALCIUM 9.1 07/15/2021 0829   PROT 6.5 06/15/2021 2240   ALBUMIN 3.6 06/15/2021 2240   AST 38 06/15/2021 2240   ALT 37 06/15/2021 2240   ALKPHOS 52 06/15/2021 2240   BILITOT 0.7 06/15/2021 2240   GFRNONAA >60 07/15/2021 0829   Lipase  No results found for: LIPASE  Studies/Results: DG Hand Complete Left  Result Date: 07/25/2021 CLINICAL DATA:  Left hand pain. EXAM: LEFT HAND - COMPLETE 3+ VIEW COMPARISON:  Left hand x-ray 02/22/2014. FINDINGS: There is no evidence for acute fracture or dislocation. There is increased and degenerative narrowing of the radiocarpal joint and ulnar carpal joint. There is  stable small well corticated density adjacent to the lateral carpal bones, likely related to old injury. There is mild interphalangeal degenerative change diffusely with joint space narrowing and osteophyte formation. There also stable mild degenerative changes at the first carpometacarpal joint, unchanged. Soft tissues are within normal limits. IMPRESSION: 1. Progression of wrist degenerative changes. 2. Stable degenerative changes of interphalangeal joints and first metacarpophalangeal joint favored as osteoarthritis. Electronically Signed   By: Ronney Asters M.D.   On: 07/25/2021 15:21    Anti-infectives: Anti-infectives (From admission, onward)    Start     Dose/Rate Route Frequency Ordered Stop   07/05/21 1200  ceFEPIme (MAXIPIME) 2 g in sodium chloride 0.9 % 100 mL IVPB        2 g 200 mL/hr over 30 Minutes Intravenous Every 8 hours 07/05/21 1052 07/11/21 1957   07/05/21 1130  ceFEPIme (MAXIPIME) 2 g in sodium chloride 0.9 % 100 mL IVPB  Status:  Discontinued        2 g 200 mL/hr over 30 Minutes Intravenous Every 12 hours 07/05/21 1042 07/05/21 1052   06/24/21 1800  ceFAZolin (ANCEF) IVPB 2g/100 mL premix        2 g 200 mL/hr over 30 Minutes Intravenous Every 8 hours 06/24/21 1116 06/28/21 1849   06/22/21 1100  ceFEPIme (MAXIPIME) 2 g in sodium chloride 0.9 % 100 mL IVPB  Status:  Discontinued  2 g 200 mL/hr over 30 Minutes Intravenous Every 8 hours 06/22/21 0957 06/24/21 1116        Assessment/Plan Found down SAH, IVH, parenchymal contusion - NSGY c/s, Dr. Kathyrn Sheriff, repeat [facility] 9/18 w/ progression of SAH. F/U CT H evolving contusions and decreased SAH, small B hygromas. Continue TBI team therapies. Scheduled klonopin to TID, continue BID seroquel. Ativan prn Skull fracture - NSGY c/s, Dr. Kathyrn Sheriff Nondisplaced facial fractures - ENT c/s, Dr. Claudia Desanctis, nonop, head of bed elevation to help with swelling, follow up outpatient Alcohol abuse - 186 on admission. CIWA Generalized  lymphadenopthy - will need outpatient W/U ?CLL  Fall 10/17 - no signs of injury L hand pain - xray negative for fracture  L knee pain - xray negative for fracture  FEN - D3 diet VTE - SCDs, Lovenox ID - no current abx, afebrile Dispo - 4NP, PT/OT, SNF. TOC note reviewed. Has bed offers for [facility] and [facility]. Family wishes to eval options in [place] area. He is medically stable for discharge.    LOS: 41 days    Jillyn Ledger , [facility] Surgery 07/26/2021, 9:01 AM Please see Amion for pager number during day hours 7:00am-4:30pm

## 2021-07-26 NOTE — TOC Progression Note (Signed)
Transition of Care (TOC) - Progression Note    Patient Details  Name: Randall Harvey. MRN: [id number] Date of Birth: July 20, 1961  Transition of Care [facility]) CM/SW Contact  Oren Section Cleta Alberts, RN Phone Number: 07/26/2021, 4:44 PM  Clinical Narrative:   Case manager has faxed referrals to all preferred [facility] area facilities.  Hopeful for additional bed offer in this area, as preferred by family.  Will follow up on Monday.  Family will have to make a decision early next week.     Expected Discharge Plan: [place] Barriers to Discharge: Continued Medical Work up  Expected Discharge Plan and Services Expected Discharge Plan: [place]   Discharge Planning Services: CM Consult Post Acute Care Choice: [place] Living arrangements for the past 2 months: Single Family Home                                       Social Determinants of Health (SDOH) Interventions    Readmission Risk Interventions No flowsheet data found.  Reinaldo Raddle, RN, BSN  Trauma/Neuro ICU Case Manager [phone]

## 2021-07-26 NOTE — Progress Notes (Signed)
RN had just received bedside report and left to go check on her other patients when pt bed alarm went off, RN and other nurses on the floor went and found pt lying on the floor. Pt denies hitting his head but endorsed back and buttock pain mostly to the left buttock and left backside noted to be be bruised. Vitals was taken and pt assisted back to bed. Dr. Redmond Pulling was paged and notified with no response. Pt kept on trying to get out of bed and as nurses kept on redirecting pt to remain in bed pt became unresponsive in bed with right side gaze preference, pt assessed to be breathing with a pulse but not responsive to stimuli. Pt pupils were reactive when checked. Rapid response RN was called and at bedside. Dr. Redmond Pulling paged and notified again and CT stat order received. Pt taken to CT with Rapid response RN. Pt back to bed. Pt still keeps on trying to get out of bed. Bed alarm on, call light within reach and floor mats remain on floors. Will continue to closely monitor pt. Delia Heady RN    07/26/21 1935  Vitals  Temp 97.8 F (36.6 C)  Temp Source Oral  BP 113/72  BP Location Left Arm  BP Method Automatic  Patient Position (if appropriate) Lying  Pulse Rate (!) 104  Pulse Rate Source Dinamap  Resp 20  MEWS COLOR  MEWS Score Color Green  Oxygen Therapy  SpO2 98 %  O2 Device Room Air  MEWS Score  MEWS Temp 0  MEWS Systolic 0  MEWS Pulse 1  MEWS RR 0  MEWS LOC 0  MEWS Score 1

## 2021-07-27 MED ORDER — LORAZEPAM 1 MG PO TABS
1.0000 mg | ORAL_TABLET | Freq: Four times a day (QID) | ORAL | Status: DC | PRN
  Administered 2021-07-27 – 2021-07-30 (×6): 1 mg via ORAL
  Filled 2021-07-27 (×7): qty 1

## 2021-07-27 MED ORDER — HALOPERIDOL 1 MG PO TABS
5.0000 mg | ORAL_TABLET | Freq: Four times a day (QID) | ORAL | Status: DC | PRN
  Administered 2021-07-27 – 2021-07-30 (×7): 5 mg via ORAL
  Filled 2021-07-27 (×8): qty 5

## 2021-07-27 MED ORDER — HALOPERIDOL LACTATE 5 MG/ML IJ SOLN
5.0000 mg | Freq: Four times a day (QID) | INTRAMUSCULAR | Status: DC | PRN
  Filled 2021-07-27: qty 1

## 2021-07-27 NOTE — Progress Notes (Signed)
16 Days Post-Op  Subjective: CC: Events from overnight noted.  He feels well this morning except his left buttock is sore.  He is otherwise very eager to get out of here.  Objective: Vital signs in last 24 hours: Temp:  [97.4 F (36.3 C)-98 F (36.7 C)] 97.4 F (36.3 C) (10/29 0718) Pulse Rate:  [89-104] 89 (10/29 0718) Resp:  [18-20] 18 (10/29 0718) BP: (104-135)/(63-89) 104/63 (10/29 0718) SpO2:  [95 %-99 %] 95 % (10/29 0718) Weight:  [73 kg] 73 kg (10/29 0500) Last BM Date: 07/26/21  Intake/Output from previous day: 10/28 0701 - 10/29 0700 In: 840 [P.O.:840] Out: -  Intake/Output this shift: Total I/O In: 120 [P.O.:120] Out: 475 [Urine:475]  PE: Gen: comfortable, no distress Neuro: non-focal exam, MAE HEENT: PERRL CV: regular  Pulm: unlabored breathing, CTA b/l Abd: soft, NT, ND, +BS Extr: No LE edema.  Some soreness to R buttock, but no erythema, edema, or ecchymosis noted.  Normal ROM  Lab Results:  No results for input(s): WBC, HGB, HCT, PLT in the last 72 hours. BMET No results for input(s): NA, K, CL, CO2, GLUCOSE, BUN, CREATININE, CALCIUM in the last 72 hours. PT/INR No results for input(s): LABPROT, INR in the last 72 hours. CMP     Component Value Date/Time   NA 135 07/15/2021 0829   K 4.0 07/15/2021 0829   CL 104 07/15/2021 0829   CO2 25 07/15/2021 0829   GLUCOSE 102 (H) 07/15/2021 0829   BUN 14 07/15/2021 0829   CREATININE 0.74 07/15/2021 0829   CALCIUM 9.1 07/15/2021 0829   PROT 6.5 06/15/2021 2240   ALBUMIN 3.6 06/15/2021 2240   AST 38 06/15/2021 2240   ALT 37 06/15/2021 2240   ALKPHOS 52 06/15/2021 2240   BILITOT 0.7 06/15/2021 2240   GFRNONAA >60 07/15/2021 0829   Lipase  No results found for: LIPASE  Studies/Results: CT HEAD WO CONTRAST (5MM)  Result Date: 07/26/2021 CLINICAL DATA:  Head trauma.  Skull fracture or hematoma. EXAM: CT HEAD WITHOUT CONTRAST TECHNIQUE: Contiguous axial images were obtained from the base of the  skull through the vertex without intravenous contrast. COMPARISON:  MR head without contrast FINDINGS: Brain: Bilateral intermediate density extra-axial fluid collections are again seen. Measured on coronal re-formatted images, the right-sided collection is 6 mm. The left-sided collection is 5 mm. This is stable from the prior exam. No midline shift is present. No new hemorrhage is present. Ventricles are of normal size. A remote lacunar infarct is noted within the posterior right cerebellum. Vascular: Atherosclerotic calcifications are present the cavernous internal carotid arteries, more prominent on the left. No hyperdense vessel is present. Skull: Calvarium is intact. Calvarium is intact. No focal lytic or blastic lesions are present. Sinuses/Orbits: Left maxillary sinus surgery noted. The left maxillary sinus is opacified and shrunken. Polyp or mucous retention cyst present posterior right maxillary sinus. The globes and orbits are within normal limits. IMPRESSION: 1. Stable appearance of bilateral intermediate density extra-axial fluid collections, subacute subdural hematomas. 2. No new hemorrhage or significant mass effect. 3. Remote lacunar infarct of the posterior right cerebellum. 4. Atherosclerosis. 5. Chronic left maxillary sinus disease. Electronically Signed   By: San Morelle M.D.   On: 07/26/2021 20:46   DG Hand Complete Left  Result Date: 07/25/2021 CLINICAL DATA:  Left hand pain. EXAM: LEFT HAND - COMPLETE 3+ VIEW COMPARISON:  Left hand x-ray 02/22/2014. FINDINGS: There is no evidence for acute fracture or dislocation. There is increased and degenerative  narrowing of the radiocarpal joint and ulnar carpal joint. There is stable small well corticated density adjacent to the lateral carpal bones, likely related to old injury. There is mild interphalangeal degenerative change diffusely with joint space narrowing and osteophyte formation. There also stable mild degenerative changes at the  first carpometacarpal joint, unchanged. Soft tissues are within normal limits. IMPRESSION: 1. Progression of wrist degenerative changes. 2. Stable degenerative changes of interphalangeal joints and first metacarpophalangeal joint favored as osteoarthritis. Electronically Signed   By: Ronney Asters M.D.   On: 07/25/2021 15:21    Anti-infectives: Anti-infectives (From admission, onward)    Start     Dose/Rate Route Frequency Ordered Stop   07/05/21 1200  ceFEPIme (MAXIPIME) 2 g in sodium chloride 0.9 % 100 mL IVPB        2 g 200 mL/hr over 30 Minutes Intravenous Every 8 hours 07/05/21 1052 07/11/21 1957   07/05/21 1130  ceFEPIme (MAXIPIME) 2 g in sodium chloride 0.9 % 100 mL IVPB  Status:  Discontinued        2 g 200 mL/hr over 30 Minutes Intravenous Every 12 hours 07/05/21 1042 07/05/21 1052   06/24/21 1800  ceFAZolin (ANCEF) IVPB 2g/100 mL premix        2 g 200 mL/hr over 30 Minutes Intravenous Every 8 hours 06/24/21 1116 06/28/21 1849   06/22/21 1100  ceFEPIme (MAXIPIME) 2 g in sodium chloride 0.9 % 100 mL IVPB  Status:  Discontinued        2 g 200 mL/hr over 30 Minutes Intravenous Every 8 hours 06/22/21 0957 06/24/21 1116        Assessment/Plan Found down SAH, IVH, parenchymal contusion - NSGY c/s, Dr. Kathyrn Sheriff, repeat [facility] 9/18 w/ progression of SAH. F/U CT H evolving contusions and decreased SAH, small B hygromas. Continue TBI team therapies. Scheduled klonopin to TID, continue BID seroquel. Ativan prn Skull fracture - NSGY c/s, Dr. Kathyrn Sheriff Nondisplaced facial fractures - ENT c/s, Dr. Claudia Desanctis, nonop, head of bed elevation to help with swelling, follow up outpatient Alcohol abuse - 186 on admission. CIWA Generalized lymphadenopthy - will need outpatient W/U ?CLL  Fall 10/17 - no signs of injury L hand pain - xray negative for fracture  L knee pain - xray negative for fracture  FEN - D3 diet VTE - SCDs, Lovenox ID - no current abx, afebrile Dispo - 4NP, PT/OT, SNF. Long  discussion with Grayland Ormond, patient's son this morning.  He has picked [facility] facility for his father.  I have let SW know so they can go ahead and send insurance auth so he can discharge as soon as that is back.   LOS: 42 days    Henreitta Cea , [facility] Surgery 07/27/2021, 9:32 AM Please see Amion for pager number during day hours 7:00am-4:30pm

## 2021-07-27 NOTE — TOC Progression Note (Signed)
Transition of Care (TOC) - Progression Note    Patient Details  Name: Randall Harvey. MRN: [id number] Date of Birth: January 17, 1961  Transition of Care [facility]) CM/SW [place], [place] Phone Number: 07/27/2021, 11:40 AM  Clinical Narrative:    CSW discussed with admissions at [facility] and anticipate they will have a bed available Monday. CSW will begin auth process in addition.    Expected Discharge Plan: [place] Barriers to Discharge: Continued Medical Work up  Expected Discharge Plan and Services Expected Discharge Plan: [place]   Discharge Planning Services: CM Consult Post Acute Care Choice: [place] Living arrangements for the past 2 months: Single Family Home                                       Social Determinants of Health (SDOH) Interventions    Readmission Risk Interventions No flowsheet data found.

## 2021-07-28 NOTE — Progress Notes (Signed)
17 Days Post-Op  Subjective: CC: Patient much better today.  Got to speak to his son yesterday and was tearful and grateful today that he feels like his son really does care about him. He is more settled as he knows his things are being taken care of while being here.  Objective: Vital signs in last 24 hours: Temp:  [97.7 F (36.5 C)-98.7 F (37.1 C)] 98 F (36.7 C) (10/30 0821) Pulse Rate:  [83-118] 99 (10/30 0821) Resp:  [16-20] 16 (10/30 0429) BP: (115-146)/(79-90) 146/85 (10/30 0821) SpO2:  [94 %-98 %] 98 % (10/30 0821) Last BM Date: 07/26/21  Intake/Output from previous day: 10/29 0701 - 10/30 0700 In: 960 [P.O.:960] Out: 1150 [Urine:1150] Intake/Output this shift: No intake/output data recorded.  PE: Gen: comfortable, no distress Neuro: non-focal exam, MAE HEENT: PERRL CV: regular  Pulm: unlabored breathing, CTA b/l Abd: soft, NT, ND, +BS Extr: No LE edema.    Lab Results:  No results for input(s): WBC, HGB, HCT, PLT in the last 72 hours. BMET No results for input(s): NA, K, CL, CO2, GLUCOSE, BUN, CREATININE, CALCIUM in the last 72 hours. PT/INR No results for input(s): LABPROT, INR in the last 72 hours. CMP     Component Value Date/Time   NA 135 07/15/2021 0829   K 4.0 07/15/2021 0829   CL 104 07/15/2021 0829   CO2 25 07/15/2021 0829   GLUCOSE 102 (H) 07/15/2021 0829   BUN 14 07/15/2021 0829   CREATININE 0.74 07/15/2021 0829   CALCIUM 9.1 07/15/2021 0829   PROT 6.5 06/15/2021 2240   ALBUMIN 3.6 06/15/2021 2240   AST 38 06/15/2021 2240   ALT 37 06/15/2021 2240   ALKPHOS 52 06/15/2021 2240   BILITOT 0.7 06/15/2021 2240   GFRNONAA >60 07/15/2021 0829   Lipase  No results found for: LIPASE  Studies/Results: CT HEAD WO CONTRAST (5MM)  Result Date: 07/26/2021 CLINICAL DATA:  Head trauma.  Skull fracture or hematoma. EXAM: CT HEAD WITHOUT CONTRAST TECHNIQUE: Contiguous axial images were obtained from the base of the skull through the vertex  without intravenous contrast. COMPARISON:  MR head without contrast FINDINGS: Brain: Bilateral intermediate density extra-axial fluid collections are again seen. Measured on coronal re-formatted images, the right-sided collection is 6 mm. The left-sided collection is 5 mm. This is stable from the prior exam. No midline shift is present. No new hemorrhage is present. Ventricles are of normal size. A remote lacunar infarct is noted within the posterior right cerebellum. Vascular: Atherosclerotic calcifications are present the cavernous internal carotid arteries, more prominent on the left. No hyperdense vessel is present. Skull: Calvarium is intact. Calvarium is intact. No focal lytic or blastic lesions are present. Sinuses/Orbits: Left maxillary sinus surgery noted. The left maxillary sinus is opacified and shrunken. Polyp or mucous retention cyst present posterior right maxillary sinus. The globes and orbits are within normal limits. IMPRESSION: 1. Stable appearance of bilateral intermediate density extra-axial fluid collections, subacute subdural hematomas. 2. No new hemorrhage or significant mass effect. 3. Remote lacunar infarct of the posterior right cerebellum. 4. Atherosclerosis. 5. Chronic left maxillary sinus disease. Electronically Signed   By: San Morelle M.D.   On: 07/26/2021 20:46    Anti-infectives: Anti-infectives (From admission, onward)    Start     Dose/Rate Route Frequency Ordered Stop   07/05/21 1200  ceFEPIme (MAXIPIME) 2 g in sodium chloride 0.9 % 100 mL IVPB        2 g 200 mL/hr over 30  Minutes Intravenous Every 8 hours 07/05/21 1052 07/11/21 1957   07/05/21 1130  ceFEPIme (MAXIPIME) 2 g in sodium chloride 0.9 % 100 mL IVPB  Status:  Discontinued        2 g 200 mL/hr over 30 Minutes Intravenous Every 12 hours 07/05/21 1042 07/05/21 1052   06/24/21 1800  ceFAZolin (ANCEF) IVPB 2g/100 mL premix        2 g 200 mL/hr over 30 Minutes Intravenous Every 8 hours 06/24/21 1116  06/28/21 1849   06/22/21 1100  ceFEPIme (MAXIPIME) 2 g in sodium chloride 0.9 % 100 mL IVPB  Status:  Discontinued        2 g 200 mL/hr over 30 Minutes Intravenous Every 8 hours 06/22/21 0957 06/24/21 1116        Assessment/Plan Found down SAH, IVH, parenchymal contusion - NSGY c/s, Dr. Kathyrn Sheriff, repeat [facility] 9/18 w/ progression of SAH. F/U CT H evolving contusions and decreased SAH, small B hygromas. Continue TBI team therapies. Scheduled klonopin to TID, continue BID seroquel. Ativan prn Skull fracture - NSGY c/s, Dr. Kathyrn Sheriff Nondisplaced facial fractures - ENT c/s, Dr. Claudia Desanctis, nonop, head of bed elevation to help with swelling, follow up outpatient Alcohol abuse - 186 on admission. CIWA Generalized lymphadenopthy - will need outpatient W/U ?CLL  Fall 10/17 - no signs of injury L hand pain - xray negative for fracture  L knee pain - xray negative for fracture  FEN - D3 diet VTE - SCDs, Lovenox ID - no current abx, afebrile Dispo - 4NP, PT/OT, SNF. Likely to have a bed at [place] tomorrow after insurance auth completed   LOS: 69 days    Randall Harvey , [facility] 07/28/2021, 10:25 AM Please see Amion for pager number during day hours 7:00am-4:30pm

## 2021-07-29 LAB — RESP PANEL BY RT-PCR (FLU A&B, COVID) ARPGX2
Influenza A by PCR: NEGATIVE
Influenza B by PCR: NEGATIVE
SARS Coronavirus 2 by RT PCR: NEGATIVE

## 2021-07-29 MED ORDER — HALOPERIDOL LACTATE 5 MG/ML IJ SOLN
10.0000 mg | Freq: Once | INTRAMUSCULAR | Status: AC
  Administered 2021-07-29: 10 mg via INTRAMUSCULAR

## 2021-07-29 NOTE — TOC Progression Note (Addendum)
Transition of Care (TOC) - Progression Note    Patient Details  Name: Randall Harvey. MRN: [id number] Date of Birth: Jun 28, 1961  Transition of Care [facility]) CM/SW Contact  Joanne Chars, LCSW Phone Number: 07/29/2021, 9:07 AM  Clinical Narrative:  Pt needs SNF auth, no recent PT note, spoke with PT and pt will be seen this AM.   Auth submitted and approved immediately in [place]: [id number], 3 days, 10/31-11/2.  Message left with Helene Kelp at [facility].  Need covid test.     Expected Discharge Plan: [place] Barriers to Discharge: Continued Medical Work up  Expected Discharge Plan and Services Expected Discharge Plan: [place]   Discharge Planning Services: CM Consult Post Acute Care Choice: [place] Living arrangements for the past 2 months: Single Family Home                                       Social Determinants of Health (SDOH) Interventions    Readmission Risk Interventions No flowsheet data found.

## 2021-07-29 NOTE — TOC Progression Note (Addendum)
Transition of Care (TOC) - Progression Note    Patient Details  Name: Randall Harvey. MRN: [id number] Date of Birth: 13-Jun-1961  Transition of Care [facility]) CM/SW Contact  Ella Bodo, RN Phone Number: 07/29/2021, 1330  Clinical Narrative:    Insurance authorization has been received for SNF admission, and bed available at [facility] on Tuesday, November 1, as confirmed by [place] in admissions.  Will arrange PTAR transport in a.m.; plan discharge to SNF tomorrow. COVID test done today, and is negative.   Expected Discharge Plan: [place] Barriers to Discharge: Continued Medical Work up  Expected Discharge Plan and Services Expected Discharge Plan: [place]   Discharge Planning Services: CM Consult Post Acute Care Choice: [place] Living arrangements for the past 2 months: Single Family Home                                       Social Determinants of Health (SDOH) Interventions    Readmission Risk Interventions No flowsheet data found.  Reinaldo Raddle, RN, BSN  Trauma/Neuro ICU Case Manager [phone]

## 2021-07-29 NOTE — Progress Notes (Signed)
18 Days Post-Op  Subjective: CC: No new complaints. Hopeful to go to rehab today. Tolerating diet without n/v. BM yesterday. Voiding.   Objective: Vital signs in last 24 hours: Temp:  [97.4 F (36.3 C)-98.6 F (37 C)] 97.6 F (36.4 C) (10/31 0753) Pulse Rate:  [88-105] 96 (10/31 0753) Resp:  [16] 16 (10/31 0306) BP: (124-150)/(74-83) 128/83 (10/31 0753) SpO2:  [96 %-99 %] 98 % (10/31 0753) Last BM Date: 07/28/21  Intake/Output from previous day: No intake/output data recorded. Intake/Output this shift: No intake/output data recorded.  PE: Gen: comfortable, no distress Neuro: non-focal exam, MAE HEENT: PERRL CV: regular  Pulm: unlabored breathing, CTA b/l Abd: soft, NT, ND, +BS Extr: No LE edema.    Lab Results:  No results for input(s): WBC, HGB, HCT, PLT in the last 72 hours. BMET No results for input(s): NA, K, CL, CO2, GLUCOSE, BUN, CREATININE, CALCIUM in the last 72 hours. PT/INR No results for input(s): LABPROT, INR in the last 72 hours. CMP     Component Value Date/Time   NA 135 07/15/2021 0829   K 4.0 07/15/2021 0829   CL 104 07/15/2021 0829   CO2 25 07/15/2021 0829   GLUCOSE 102 (H) 07/15/2021 0829   BUN 14 07/15/2021 0829   CREATININE 0.74 07/15/2021 0829   CALCIUM 9.1 07/15/2021 0829   PROT 6.5 06/15/2021 2240   ALBUMIN 3.6 06/15/2021 2240   AST 38 06/15/2021 2240   ALT 37 06/15/2021 2240   ALKPHOS 52 06/15/2021 2240   BILITOT 0.7 06/15/2021 2240   GFRNONAA >60 07/15/2021 0829   Lipase  No results found for: LIPASE  Studies/Results: No results found.  Anti-infectives: Anti-infectives (From admission, onward)    Start     Dose/Rate Route Frequency Ordered Stop   07/05/21 1200  ceFEPIme (MAXIPIME) 2 g in sodium chloride 0.9 % 100 mL IVPB        2 g 200 mL/hr over 30 Minutes Intravenous Every 8 hours 07/05/21 1052 07/11/21 1957   07/05/21 1130  ceFEPIme (MAXIPIME) 2 g in sodium chloride 0.9 % 100 mL IVPB  Status:  Discontinued         2 g 200 mL/hr over 30 Minutes Intravenous Every 12 hours 07/05/21 1042 07/05/21 1052   06/24/21 1800  ceFAZolin (ANCEF) IVPB 2g/100 mL premix        2 g 200 mL/hr over 30 Minutes Intravenous Every 8 hours 06/24/21 1116 06/28/21 1849   06/22/21 1100  ceFEPIme (MAXIPIME) 2 g in sodium chloride 0.9 % 100 mL IVPB  Status:  Discontinued        2 g 200 mL/hr over 30 Minutes Intravenous Every 8 hours 06/22/21 0957 06/24/21 1116        Assessment/Plan Found down SAH, IVH, parenchymal contusion - NSGY c/s, Dr. Kathyrn Sheriff, repeat [facility] 9/18 w/ progression of SAH. F/U CT H evolving contusions and decreased SAH, small B hygromas. Continue TBI team therapies. Scheduled klonopin to TID, continue BID seroquel. Ativan prn Skull fracture - NSGY c/s, Dr. Kathyrn Sheriff Nondisplaced facial fractures - ENT c/s, Dr. Claudia Desanctis, nonop, head of bed elevation to help with swelling, follow up outpatient Alcohol abuse - 186 on admission. CIWA Generalized lymphadenopthy - will need outpatient W/U ?CLL  Fall 10/17 - no signs of injury L hand pain - xray negative for fracture  L knee pain - xray negative for fracture  FEN - D3 diet VTE - SCDs, Lovenox ID - no current abx, afebrile Dispo - 4NP, PT/OT,  SNF. Likely to have a bed at [place] today after insurance auth completed   LOS: 44 days    Jillyn Ledger , [facility] Surgery 07/29/2021, 9:11 AM Please see Amion for pager number during day hours 7:00am-4:30pm
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
## 2021-07-30 MED ORDER — CLONAZEPAM 0.5 MG PO TBDP: 0.5000 mg | ORAL_TABLET | Freq: Three times a day (TID) | ORAL | 0 refills | Status: DC

## 2021-07-30 MED ORDER — FOLIC ACID 1 MG PO TABS: 1.0000 mg | ORAL_TABLET | Freq: Every day | ORAL | 0 refills | Status: DC

## 2021-07-30 MED ORDER — ACETAMINOPHEN 500 MG PO TABS: 1000.0000 mg | ORAL_TABLET | Freq: Three times a day (TID) | ORAL | 0 refills | Status: DC | PRN

## 2021-07-30 MED ORDER — QUETIAPINE FUMARATE 100 MG PO TABS: 100.0000 mg | ORAL_TABLET | Freq: Two times a day (BID) | ORAL | 0 refills | Status: DC

## 2021-07-30 MED ORDER — THIAMINE HCL 100 MG PO TABS: 100.0000 mg | ORAL_TABLET | Freq: Every day | ORAL | 0 refills | Status: DC

## 2021-07-30 MED ORDER — ADULT MULTIVITAMIN W/MINERALS CH: 1.0000 | ORAL_TABLET | Freq: Every day | ORAL | 0 refills | Status: DC

## 2021-07-30 MED ORDER — POLYETHYLENE GLYCOL 3350 17 G PO PACK: 17.0000 g | PACK | Freq: Every day | ORAL | 0 refills | Status: DC

## 2021-07-30 MED ORDER — OXYCODONE HCL 5 MG PO TABS: 5.0000 mg | ORAL_TABLET | Freq: Four times a day (QID) | ORAL | 0 refills | Status: DC | PRN

## 2021-07-30 NOTE — Plan of Care (Addendum)
Patient is being discharge to [facility]. I do not think he wants to go there but nobody in the family can take care of him at this time.
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
## 2021-07-30 NOTE — Care Management Important Message (Signed)
Important Message  Patient Details  Name: Randall Harvey. MRN: [id number] Date of Birth: November 14, 1960   Medicare Important Message Given:  Yes       07/30/2021, 4:31 PM

## 2021-07-30 NOTE — TOC Transition Note (Signed)
Transition of Care [facility]) - CM/SW Discharge Note   Patient Details  Name: Randall Harvey. MRN: [id number] Date of Birth: March 21, 1961  Transition of Care [facility]) CM/SW Contact:  Ella Bodo, RN Phone Number: 07/30/2021, 12:08 PM   Clinical Narrative:    Pt medically stable for discharge to [facility]; [employer] authorization # [id number].  Discharge summary sent to [facility] in admissions; bedside nurse to call report to [phone].  PTAR notified for transport at 12:11pm; patient's son, Grayland Ormond notified of dc today to facility.  Please notify son when PTAR picks up.     Final next level of care: Skilled Nursing Facility Barriers to Discharge: Barriers Resolved   Patient Goals and CMS Choice Patient states their goals for this hospitalization and ongoing recovery are:: to go home CMS Medicare.gov Compare Post Acute Care list provided to:: Patient Represenative (must comment) (son, Grayland Ormond) Choice offered to / list presented to : Adult Children  Discharge Placement PASRR number recieved: 07/25/21            Patient chooses bed at: Other - please specify in the comment section below: Patient to be transferred to facility by: [facility] Name of family member notified: son, Grayland Ormond Patient and family notified of of transfer: 07/30/21  Discharge Plan and Services   Discharge Planning Services: CM Consult Post Acute Care Choice: [facility]                               Social Determinants of Health (SDOH) Interventions     Readmission Risk Interventions No flowsheet data found.  Reinaldo Raddle, RN, BSN  Trauma/Neuro ICU Case Manager [phone]

## 2021-07-31 ENCOUNTER — Encounter: Payer: Self-pay | Admitting: Intensive Care

## 2021-08-02 ENCOUNTER — Emergency Department (HOSPITAL_COMMUNITY)
Admission: EM | Admit: 2021-08-02 | Discharge: 2021-08-02 | Disposition: A | Discharge status: Home / Self Care | Payer: Medicare Other | Attending: Emergency Medicine | Admitting: Emergency Medicine

## 2021-08-02 ENCOUNTER — Other Ambulatory Visit: Payer: Self-pay

## 2021-08-02 ENCOUNTER — Encounter (HOSPITAL_COMMUNITY): Payer: Self-pay | Admitting: Emergency Medicine

## 2021-08-02 DIAGNOSIS — S0990XS Unspecified injury of head, sequela: Secondary | ICD-10-CM | POA: Diagnosis present

## 2021-08-02 DIAGNOSIS — J449 Chronic obstructive pulmonary disease, unspecified: Secondary | ICD-10-CM | POA: Insufficient documentation

## 2021-08-02 DIAGNOSIS — Z85528 Personal history of other malignant neoplasm of kidney: Secondary | ICD-10-CM | POA: Insufficient documentation

## 2021-08-02 DIAGNOSIS — I10 Essential (primary) hypertension: Secondary | ICD-10-CM | POA: Diagnosis not present

## 2021-08-02 DIAGNOSIS — Z7901 Long term (current) use of anticoagulants: Secondary | ICD-10-CM | POA: Diagnosis not present

## 2021-08-02 DIAGNOSIS — F17210 Nicotine dependence, cigarettes, uncomplicated: Secondary | ICD-10-CM | POA: Insufficient documentation

## 2021-08-02 DIAGNOSIS — E119 Type 2 diabetes mellitus without complications: Secondary | ICD-10-CM | POA: Insufficient documentation

## 2021-08-02 DIAGNOSIS — S069X9S Unspecified intracranial injury with loss of consciousness of unspecified duration, sequela: Secondary | ICD-10-CM | POA: Diagnosis not present
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
## 2021-08-02 NOTE — ED Notes (Signed)
Pt given burgundy scrubs to change into. Instructed pt to place clothing and belongings into pt belongings bag. Pt currently in the bathroom. Pt cooperative at this time.

## 2021-08-02 NOTE — ED Notes (Signed)
Pts now changed into burgundy scrubs. Clothing and belongings in belongings bag labeled with pt sticker.

## 2021-08-02 NOTE — ED Provider Notes (Signed)
[facility] EMERGENCY DEPARTMENT Provider Note   CSN: [id number] Arrival date & time: 08/02/21  1122     History Chief Complaint  Patient presents with   IVC    Randall Harvey. is a 60 y.o. male presents to the ED via [employer] for evaluation of the IVC.  Patient was recently discharged from a called [facility] for treatment completion. Patient placed there after being discharged from [facility] for a TBI per therapy recommendations. Per the IVC, the patient has a drug and alcohol problem, although the patient denies any use for months. The IVC also states that the patient was being aggressive to staff and fellow residents. He denies any SI, HI, aggression, hallucinations, or any other symptoms. He reports he would like to go home.   HPI     Past Medical History:  Diagnosis Date   Anticoagulant long-term use    lifetime use, coumadin therapy, then Xarelto   Anxiety    Arthritis    Cancer ([place])    renal   Carotid atherosclerosis    <50% left and right   Clotting disorder ([place])    COPD (chronic obstructive pulmonary disease) ([place])    Depression    Elevated liver enzymes March 2015   Fatty liver    H/O drug abuse ([place])    H/O ETOH abuse    Hepatitis    Hypertension    Obesity    Personal history of DVT (deep vein thrombosis) Oct 2014   Pre-diabetes    Pulmonary embolism and infarction [facility]) Oct 2014   Pulmonary embolism and infarction [facility]) March 2015   Pulmonary embolus with infarction [facility])    Renal cell carcinoma ([place])    Renal mass, left Oct 2014   with ureteral obstruction   Stroke ([place])    Thrombocythemia    Thrombocytopenia ([place])    Tobacco abuse     Patient Active Problem List   Diagnosis Date Noted   Malnutrition of moderate degree 06/20/2021   Skull fracture ([place]) 06/15/2021   Type 2 diabetes mellitus with obesity ([place]) 10/05/2018   Hypertriglyceridemia 09/27/2018   DVT of lower limb, acute ([place]) 01/05/2018   Marijuana use 08/08/2016    Anxiety disorder 09/01/2015   Obesity 09/01/2015   Tobacco abuse 09/01/2015   Fatty liver 09/01/2015   Ganglion cyst of flexor tendon sheath of finger 08/28/2015   Knee pain, bilateral 08/28/2015   Pulmonary embolus ([place]) 08/28/2015   Erectile dysfunction 08/28/2015   Positive 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 03/21/2015   Renal cell carcinoma ([place]) 02/22/2015   COPD, mild ([place]) 06/06/2014    Past Surgical History:  Procedure Laterality Date   ANKLE FRACTURE SURGERY Right 12/2012   ANKLE SURGERY     EXCISION METACARPAL MASS Right 09/18/2016   Procedure: EXCISION METACARPAL MASS;  Surgeon: Hessie Knows, MD;  Location: [facility] ORS;  Service: Orthopedics;  Laterality: Right;  5th digit    HERNIA REPAIR     Umbilical Hernia   ORBITAL FRACTURE SURGERY Left    ROBOTIC ASSITED PARTIAL NEPHRECTOMY Left Oct 2014       Family History  Problem Relation Age of Onset   Cancer Mother        pancreatic   Cancer Father        colon   Diabetes Father    Cancer Sister        colon   COPD Sister    Seizures Sister    Cancer Paternal Grandmother  bone   Stroke Paternal Grandfather    Heart disease Paternal Grandfather    Cancer Sister        ovarian   Hypertension Neg Hx     Social History   Tobacco Use   Smoking status: Every Day    Packs/day: 0.50    Types: Cigarettes   Smokeless tobacco: Never  Vaping Use   Vaping Use: Never used  Substance Use Topics   Alcohol use: Yes    Comment: occasionally   Drug use: Yes    Types: Marijuana    Comment: occ    Home Medications Prior to Admission medications   Medication Sig Start Date End Date Taking? Authorizing Provider  acetaminophen (TYLENOL) 500 MG tablet Take 2 tablets (1,000 mg total) by mouth every 8 (eight) hours as needed. 07/30/21   Maczis, Barth Kirks, PA-C  clonazePAM (KLONOPIN) 0.5 MG disintegrating tablet Take 1 tablet (0.5 mg total) by mouth 3 (three) times daily. 07/30/21   Maczis, Barth Kirks, PA-C  docusate sodium  (COLACE) 100 MG capsule Take 1 tablet once or twice daily as needed for constipation while taking narcotic pain medicine Patient not taking: Reported on 09/27/2018 08/16/18   Hinda Kehr, MD  folic acid (FOLVITE) 1 MG tablet Take 1 tablet (1 mg total) by mouth daily. 07/31/21   Maczis, Barth Kirks, PA-C  Multiple Vitamin (MULTIVITAMIN WITH MINERALS) TABS tablet Take 1 tablet by mouth daily. 07/31/21   Maczis, Barth Kirks, PA-C  oxyCODONE (OXY IR/ROXICODONE) 5 MG immediate release tablet Take 1 tablet (5 mg total) by mouth every 6 (six) hours as needed for severe pain. 07/30/21   Maczis, Barth Kirks, PA-C  polyethylene glycol (MIRALAX / GLYCOLAX) 17 g packet Take 17 g by mouth daily. 07/31/21   Maczis, Barth Kirks, PA-C  QUEtiapine (SEROQUEL) 100 MG tablet Take 1 tablet (100 mg total) by mouth 2 (two) times daily. 07/30/21   Maczis, Barth Kirks, PA-C  rivaroxaban (XARELTO) 20 MG TABS tablet TAKE ONE TABLET BY MOUTH ONCE DAILY WITH  SUPPER 09/27/18   Lada, Satira Anis, MD  Rivaroxaban 15 & 20 MG TBPK Take as directed on package: Start with one 15mg  tablet by mouth twice a day with food. On Day 22, switch to one 20mg  tablet once a day with food. 02/22/19   Gregor Hams, MD  thiamine 100 MG tablet Take 1 tablet (100 mg total) by mouth daily. 07/31/21   Maczis, Barth Kirks, PA-C    Allergies    Patient has no known allergies.  Review of Systems   Review of Systems  Constitutional:  Negative for chills and fever.  HENT:  Negative for ear pain and sore throat.   Eyes:  Negative for pain and visual disturbance.  Respiratory:  Negative for cough and shortness of breath.   Cardiovascular:  Negative for chest pain and palpitations.  Gastrointestinal:  Negative for abdominal pain and vomiting.  Genitourinary:  Negative for dysuria and hematuria.  Musculoskeletal:  Negative for arthralgias and back pain.  Skin:  Negative for color change and rash.  Neurological:  Negative for seizures and syncope.   Psychiatric/Behavioral:  Negative for agitation, confusion, dysphoric mood, hallucinations, self-injury and suicidal ideas. The patient is not nervous/anxious.   All other systems reviewed and are negative.  Physical Exam Updated Vital Signs BP 123/83   Pulse 60   Temp 98 F (36.7 C) (Oral)   Resp 15   SpO2 97%   Physical Exam Constitutional:  General: He is not in acute distress.    Appearance: Normal appearance. He is not toxic-appearing.  Eyes:     General: No scleral icterus. Pulmonary:     Effort: Pulmonary effort is normal. No respiratory distress.  Skin:    General: Skin is dry.     Findings: No rash.  Neurological:     General: No focal deficit present.     Mental Status: He is alert. Mental status is at baseline.     Comments: Patient is oriented to person, place, time, and situation.   Psychiatric:        Attention and Perception: Attention normal.        Mood and Affect: Mood normal.        Behavior: Behavior normal.        Judgment: Judgment normal.    ED Results / Procedures / Treatments   Labs (all labs ordered are listed, but only abnormal results are displayed) Labs Reviewed - No data to display  EKG None  Radiology No results found.  Procedures Procedures   Medications Ordered in ED Medications - No data to display  ED Course  I have reviewed the triage vital signs and the nursing notes.  Pertinent labs & imaging results that were available during my care of the patient were reviewed by me and considered in my medical decision making (see chart for details).  The patient is here sitting comfortably appearing calm. He is conversational with staff and myself. He is aware of person, place, and time, as well as situation. He reports that the last time he used drugs or alcohol was months ago. He reports he is renting a place in [place] from a friend that has running water, electricity, and his belongings at the place. He denies any suicidal  ideations or homicidal ideations. He denies any agressive feelings towards anyone. He has not displayed any agressive behaviors to the officers, staff, or myself during his course here.   I spoke to Clarisse Gouge, an Scientist, physiological at [employer], who mentioned the behaviors the patient displayed to get home. These sound like behavioral issues, and no acute psychotic issues.   Patient does not meet criteria for IVC. Denies any HI/SI. Does not appear to be responding to internal stimuli. Does not appear acutely psychotic. He reports that he has a place to go to after discharge. While staying here, the patient does not present to be a threat to himself or others.  Discussed discharge with patient. Patient ready to go home.  Officers will escort patient home.  Return precautions discussed.  Patient is stable being discharged home with a condition.  I discussed this case with my attending physician who cosigned this note including patient's presenting symptoms, physical exam, and planned diagnostics and interventions. Attending physician stated agreement with plan or made changes to plan which were implemented.     MDM Rules/Calculators/A&P                          Final Clinical Impression(s) / ED Diagnoses Final diagnoses:  Traumatic brain injury with loss of consciousness, sequela [facility])    Rx / DC Orders ED Discharge Orders     None        Sherrell Puller, PA-C 08/02/21 1859    Daleen Bo, MD 08/03/21 1731

## 2021-08-19 ENCOUNTER — Ambulatory Visit: Payer: Self-pay | Admitting: *Deleted
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
## 2021-08-20 ENCOUNTER — Encounter: Payer: Self-pay | Admitting: Family Medicine

## 2021-08-20 ENCOUNTER — Ambulatory Visit (INDEPENDENT_AMBULATORY_CARE_PROVIDER_SITE_OTHER): Payer: Medicare Other | Admitting: Family Medicine

## 2021-08-20 ENCOUNTER — Other Ambulatory Visit: Payer: Self-pay

## 2021-08-20 VITALS — BP 102/62 | HR 112 | Temp 97.9°F | Resp 16 | Ht 65.0 in | Wt 158.3 lb

## 2021-08-20 DIAGNOSIS — E440 Moderate protein-calorie malnutrition: Secondary | ICD-10-CM

## 2021-08-20 DIAGNOSIS — I609 Nontraumatic subarachnoid hemorrhage, unspecified: Secondary | ICD-10-CM | POA: Insufficient documentation

## 2021-08-20 DIAGNOSIS — S0291XA Unspecified fracture of skull, initial encounter for closed fracture: Secondary | ICD-10-CM | POA: Diagnosis not present

## 2021-08-20 DIAGNOSIS — E1169 Type 2 diabetes mellitus with other specified complication: Secondary | ICD-10-CM | POA: Diagnosis not present

## 2021-08-20 DIAGNOSIS — R591 Generalized enlarged lymph nodes: Secondary | ICD-10-CM

## 2021-08-20 DIAGNOSIS — E669 Obesity, unspecified: Secondary | ICD-10-CM

## 2021-08-20 DIAGNOSIS — S069X9A Unspecified intracranial injury with loss of consciousness of unspecified duration, initial encounter: Secondary | ICD-10-CM | POA: Insufficient documentation

## 2021-08-20 NOTE — Assessment & Plan Note (Signed)
With ongoing memory loss and neuro deficits from [facility] and IVH, refer to neurosurgery for recommendations on ongoing monitoring/treatment. Referral placed for HHPT/OT. Obtaining labs today. F/u in 1 month.

## 2021-08-20 NOTE — Assessment & Plan Note (Signed)
Seen incidentally on imaging. Cervical, axillary, and supraclavicular homogeneous enlargement with concern for CLL per radiology report. Recommend oncologic w/u once stable.

## 2021-08-20 NOTE — Assessment & Plan Note (Signed)
Recheck a1c. F/u in 1 month for chronic health maintenance.

## 2021-08-20 NOTE — Patient Instructions (Signed)
It was great to see you!  Our plans for today:  - We are referring you to neurosurgery. Let us know if you don't hear about an appointment.  - Someone will call you to set up physical therapy at home. - Someone will call you to help you with housing.  - Come back in 1 month.   We are checking some labs today, we will release these results to your MyChart.  Take care and seek immediate care sooner if you develop any concerns.   Dr. Ky Barban
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
## 2021-08-20 NOTE — Assessment & Plan Note (Signed)
History of alcohol use d/o and with current housing instability complicating care, referred to [facility] care coordination.

## 2021-08-23 ENCOUNTER — Emergency Department
Admission: EM | Admit: 2021-08-23 | Discharge: 2021-08-23 | Discharge status: Against Medical Advice | Payer: Medicare Other | Attending: Emergency Medicine | Admitting: Emergency Medicine

## 2021-08-23 ENCOUNTER — Other Ambulatory Visit: Payer: Self-pay

## 2021-08-23 DIAGNOSIS — R519 Headache, unspecified: Secondary | ICD-10-CM | POA: Insufficient documentation

## 2021-08-23 DIAGNOSIS — F17210 Nicotine dependence, cigarettes, uncomplicated: Secondary | ICD-10-CM | POA: Insufficient documentation

## 2021-08-23 DIAGNOSIS — I10 Essential (primary) hypertension: Secondary | ICD-10-CM | POA: Diagnosis not present

## 2021-08-23 DIAGNOSIS — E119 Type 2 diabetes mellitus without complications: Secondary | ICD-10-CM | POA: Insufficient documentation

## 2021-08-23 DIAGNOSIS — J449 Chronic obstructive pulmonary disease, unspecified: Secondary | ICD-10-CM | POA: Diagnosis not present

## 2021-08-23 DIAGNOSIS — Z5321 Procedure and treatment not carried out due to patient leaving prior to being seen by health care provider: Secondary | ICD-10-CM | POA: Diagnosis not present

## 2021-08-23 DIAGNOSIS — Z85528 Personal history of other malignant neoplasm of kidney: Secondary | ICD-10-CM | POA: Diagnosis not present
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
## 2021-08-26 LAB — CBC WITH DIFFERENTIAL/PLATELET
Absolute Monocytes: 441 cells/uL (ref 200–950)
Basophils Absolute: 32 cells/uL (ref 0–200)
Basophils Relative: 0.5 %
Eosinophils Absolute: 50 cells/uL (ref 15–500)
Eosinophils Relative: 0.8 %
HCT: 44.7 % (ref 38.5–50.0)
Hemoglobin: 14.9 g/dL (ref 13.2–17.1)
Lymphs Abs: 3188 cells/uL (ref 850–3900)
MCH: 30.8 pg (ref 27.0–33.0)
MCHC: 33.3 g/dL (ref 32.0–36.0)
MCV: 92.4 fL (ref 80.0–100.0)
MPV: 10.2 fL (ref 7.5–12.5)
Monocytes Relative: 7 %
Neutro Abs: 2589 cells/uL (ref 1500–7800)
Neutrophils Relative %: 41.1 %
Platelets: 182 10*3/uL (ref 140–400)
RBC: 4.84 10*6/uL (ref 4.20–5.80)
RDW: 12.2 % (ref 11.0–15.0)
Total Lymphocyte: 50.6 %
WBC: 6.3 10*3/uL (ref 3.8–10.8)

## 2021-08-26 LAB — COMPREHENSIVE METABOLIC PANEL
AG Ratio: 1.6 (calc) (ref 1.0–2.5)
ALT: 13 U/L (ref 9–46)
AST: 12 U/L (ref 10–35)
Albumin: 3.9 g/dL (ref 3.6–5.1)
Alkaline phosphatase (APISO): 101 U/L (ref 35–144)
BUN/Creatinine Ratio: 14 (calc) (ref 6–22)
BUN: 9 mg/dL (ref 7–25)
CO2: 23 mmol/L (ref 20–32)
Calcium: 8.7 mg/dL (ref 8.6–10.3)
Chloride: 103 mmol/L (ref 98–110)
Creat: 0.63 mg/dL — ABNORMAL LOW (ref 0.70–1.35)
Globulin: 2.5 g/dL (calc) (ref 1.9–3.7)
Glucose, Bld: 127 mg/dL — ABNORMAL HIGH (ref 65–99)
Potassium: 3.6 mmol/L (ref 3.5–5.3)
Sodium: 138 mmol/L (ref 135–146)
Total Bilirubin: 0.5 mg/dL (ref 0.2–1.2)
Total Protein: 6.4 g/dL (ref 6.1–8.1)

## 2021-08-26 LAB — VITAMIN B12: Vitamin B-12: 523 pg/mL (ref 200–1100)

## 2021-08-26 LAB — HEMOGLOBIN A1C
Hgb A1c MFr Bld: 5.6 % of total Hgb (ref <5.7)
Mean Plasma Glucose: 114 mg/dL
eAG (mmol/L): 6.3 mmol/L

## 2021-08-26 LAB — VITAMIN B1: Vitamin B1 (Thiamine): 9 nmol/L (ref 8–30)

## 2021-08-27 ENCOUNTER — Telehealth: Payer: Self-pay | Admitting: *Deleted

## 2021-08-27 NOTE — Chronic Care Management (AMB) (Signed)
  Chronic Care Management   Note  08/27/2021 Name: Emmet Messer. MRN: [id number] DOB: March 02, 1961  [name] is a 60 y.o. year old male who is a primary care patient of Teodora Medici, DO. I reached out to [employer]. by phone today in response to a referral sent by [name]'s PCP.  Mr. Handlin was given information about Chronic Care Management services today including:  CCM service includes personalized support from designated clinical staff supervised by his physician, including individualized plan of care and coordination with other care providers 24/7 contact phone numbers for assistance for urgent and routine care needs. Service will only be billed when office clinical staff spend 20 minutes or more in a month to coordinate care. Only one practitioner may furnish and bill the service in a calendar month. The patient may stop CCM services at any time (effective at the end of the month) by phone call to the office staff. The patient is responsible for co-pay (up to 20% after annual deductible is met) if co-pay is required by the individual health plan.   Patient agreed to services and verbal consent obtained.   Follow up plan: Telephone appointment with care management team member scheduled for: 09/06/2021  Julian Hy, [place] Management  Direct Dial: [phone]

## 2021-08-27 NOTE — Chronic Care Management (AMB) (Signed)
  Chronic Care Management   Outreach Note  08/27/2021 Name: Randall Harvey. MRN: [id number] DOB: 1961-09-05  [name] is a 60 y.o. year old male who is a primary care patient of Teodora Medici, DO. I reached out to [employer]. by phone today in response to a referral sent by [name]'s primary care provider.  An unsuccessful telephone outreach was attempted today. The patient was referred to the case management team for assistance with care management and care coordination.   Follow Up Plan: A HIPAA compliant phone message was left for the patient providing contact information and requesting a return call.  If patient returns call to provider office, please advise to call Embedded Care Management Care Guide   at [place], [place] Management  Direct Dial: [phone]

## 2021-09-06 ENCOUNTER — Telehealth: Payer: Medicare HMO | Admitting: *Deleted

## 2021-09-06 ENCOUNTER — Telehealth: Payer: Self-pay | Admitting: *Deleted

## 2021-09-06 NOTE — Telephone Encounter (Signed)
  Care Management   Follow Up Note   09/06/2021 Name: [name]. MRN: [id number] DOB: 1960-11-25   Referred by: Teodora Medici, DO Reason for referral : Care Coordination   An unsuccessful telephone outreach was attempted today. The patient was referred to the case management team for assistance with care management and care coordination.   Follow Up Plan: Telephone follow up appointment with care management team member to be re-scheduled by care guide  Elliot Gurney, [place] Worker  [place] Care Management [phone]

## 2021-09-09 ENCOUNTER — Telehealth: Payer: Self-pay | Admitting: *Deleted

## 2021-09-09 NOTE — Chronic Care Management (AMB) (Signed)
  Care Management   Note  09/09/2021 Name: Randall Harvey. MRN: [id number] DOB: Sep 18, 1961  Randall Girt. is a 60 y.o. year old male who is a primary care patient of Teodora Medici, DO and is actively engaged with the care management team. I reached out to Randall Girt. by phone today to assist with re-scheduling an initial visit with the Licensed Clinical Social Worker  Follow up plan: Unsuccessful telephone outreach attempt made. A HIPAA compliant phone message was left for the patient providing contact information and requesting a return call.   Julian Hy, [place] Management  Direct Dial: [phone]

## 2021-09-12 NOTE — Chronic Care Management (AMB) (Signed)
°  Care Management   Note  09/12/2021 Name: Raymundo Rout. MRN: [id number] DOB: 1961/03/31  [name] is a 60 y.o. year old male who is a primary care patient of Teodora Medici, DO and is actively engaged with the care management team. I reached out to [name] by phone today to assist with re-scheduling an initial visit with the Licensed Clinical Social Worker  Follow up plan: 2nd Unsuccessful telephone outreach attempt made. A HIPAA compliant phone message was left for the patient providing contact information and requesting a return call.   Julian Hy, [place] Management  Direct Dial: [phone]

## 2021-09-16 NOTE — Chronic Care Management (AMB) (Signed)
°  Care Management   Note  09/16/2021 Name: Randall Harvey. MRN: [id number] DOB: 1961-02-13  Randall Girt. is a 60 y.o. year old male who is a primary care patient of Teodora Medici, DO and is actively engaged with the care management team. I reached out to Randall Girt. by phone today to assist with re-scheduling an initial visit with the Licensed Clinical Social Worker  Follow up plan: We have been unable to make contact with the patient for follow up. The care management team is available to follow up with the patient after provider conversation with the patient regarding recommendation for care management engagement and subsequent re-referral to the care management team.   Julian Hy, [place] Management  Direct Dial: [phone]

## 2021-09-17 ENCOUNTER — Ambulatory Visit: Payer: Medicare HMO | Admitting: Internal Medicine

## 2021-09-17 NOTE — Progress Notes (Incomplete)
Established Patient Office Visit  Subjective:  Patient ID: Randall Harvey., male    DOB: 1961/09/02  Age: 60 y.o. MRN: [id number]  CC: No chief complaint on file.   HPI Randall Harvey. presents for follow up on chronic medical conditions. He was recently in the hospital after he was found down and ultimately diagnosed with a TBI, SAH with IVH and skull fracture after being intoxicated. He was following with neurosurgery for ongoing memory loss and residual symptoms from [facility]. He was also referred to community services due to history of alcohol use and housing instability. Their outreach to the patient was unsuccessful and was referred to case management.   Diabetes, Type 2: -Last A1c 5.6% 11/22 -Medications: None -Patient is compliant with the above medications and reports no side effects. *** -Checking BG at home: *** -Fasting home BG: *** -Post-prandial home BG: *** -Highest home BG since last visit: *** -Lowest home BG since last visit: *** -Diet: *** -Exercise: *** -Eye exam: *** -Foot exam: *** -Microalbumin: *** -Statin: *** -PNA vaccine: *** -Denies symptoms of hypoglycemia, polyuria, polydipsia, numbness extremities, foot ulcers/trauma. ***  COPD: -COPD status: {Blank single:19197::"controlled","uncontrolled","better","worse","exacerbated","stable"} -Current medications: *** -Satisfied with current treatment?: {Blank single:19197::"yes","no"} -Oxygen use: {Blank single:19197::"yes","no"} -Dyspnea frequency:  -Cough frequency:  -Rescue inhaler frequency:   -Limitation of activity: {Blank single:19197::"yes","no"} -Productive cough:  -Last Spirometry/PFTs:  -Pneumovax: {Blank single:19197::"Up to Date","Not up to Date","unknown"} -Influenza: {Blank single:19197::"Up to Date","Not up to Date","unknown"}  Hx of DVT/PE: -Had been on blood thinner previously but this was discontinued after SAH  Alcohol Use Disorder: -Currently on folic acid, thiamine  Generalized  Lymphadenopathy: -Found incidentally on imagining - cervical, axillary and supraclavicular homogenous enlargement with concern for CLL per radiology report.  -Last CBC in November stable  Past Medical History:  Diagnosis Date   Anticoagulant long-term use    lifetime use, coumadin therapy, then Xarelto   Anxiety    Arthritis    Cancer ([place])    renal   Carotid atherosclerosis    <50% left and right   Clotting disorder (HCC)    COPD (chronic obstructive pulmonary disease) ([place])    Depression    Elevated liver enzymes March 2015   Fatty liver    H/O drug abuse ([place])    H/O ETOH abuse    Hepatitis    Hypertension    Obesity    Personal history of DVT (deep vein thrombosis) Oct 2014   Pre-diabetes    Pulmonary embolism and infarction [facility]) Oct 2014   Pulmonary embolism and infarction [facility]) March 2015   Pulmonary embolus with infarction [facility])    Renal cell carcinoma ([place])    Renal mass, left Oct 2014   with ureteral obstruction   Stroke ([place])    Thrombocythemia    Thrombocytopenia ([place])    Tobacco abuse     Past Surgical History:  Procedure Laterality Date   ANKLE FRACTURE SURGERY Right 12/2012   ANKLE SURGERY     EXCISION METACARPAL MASS Right 09/18/2016   Procedure: EXCISION METACARPAL MASS;  Surgeon: Hessie Knows, MD;  Location: [facility] ORS;  Service: Orthopedics;  Laterality: Right;  5th digit    HERNIA REPAIR     Umbilical Hernia   ORBITAL FRACTURE SURGERY Left    ROBOTIC ASSITED PARTIAL NEPHRECTOMY Left Oct 2014    Family History  Problem Relation Age of Onset   Cancer Mother        pancreatic   Cancer Father  colon   Diabetes Father    Cancer Sister        colon   COPD Sister    Seizures Sister    Cancer Paternal Grandmother        bone   Stroke Paternal Grandfather    Heart disease Paternal Grandfather    Cancer Sister        ovarian   Hypertension Neg Hx     Social History   Socioeconomic History   Marital status: Divorced    Spouse  name: Not on file   Number of children: Not on file   Years of education: Not on file   Highest education level: Not on file  Occupational History   Not on file  Tobacco Use   Smoking status: Every Day    Packs/day: 0.50    Types: Cigarettes   Smokeless tobacco: Never  Vaping Use   Vaping Use: Never used  Substance and Sexual Activity   Alcohol use: Yes    Comment: occasionally   Drug use: Yes    Types: Marijuana    Comment: occ   Sexual activity: Yes  Other Topics Concern   Not on file  Social History Narrative   ** Merged History Encounter **       Social Determinants of Health   Financial Resource Strain: Not on file  Food Insecurity: Not on file  Transportation Needs: Not on file  Physical Activity: Not on file  Stress: Not on file  Social Connections: Not on file  Intimate Partner Violence: Not on file    Outpatient Medications Prior to Visit  Medication Sig Dispense Refill   acetaminophen (TYLENOL) 500 MG tablet Take 2 tablets (1,000 mg total) by mouth every 8 (eight) hours as needed. 30 tablet 0   docusate sodium (COLACE) 100 MG capsule Take 1 tablet once or twice daily as needed for constipation while taking narcotic pain medicine (Patient not taking: Reported on 09/27/2018) 30 capsule 0   folic acid (FOLVITE) 1 MG tablet Take 1 tablet (1 mg total) by mouth daily. (Patient not taking: Reported on 08/20/2021) 30 tablet 0   Multiple Vitamin (MULTIVITAMIN WITH MINERALS) TABS tablet Take 1 tablet by mouth daily. (Patient not taking: Reported on 08/20/2021) 30 tablet 0   polyethylene glycol (MIRALAX / GLYCOLAX) 17 g packet Take 17 g by mouth daily. (Patient not taking: Reported on 08/20/2021) 14 each 0   thiamine 100 MG tablet Take 1 tablet (100 mg total) by mouth daily. (Patient not taking: Reported on 08/20/2021) 30 tablet 0   No facility-administered medications prior to visit.    No Known Allergies  ROS Review of Systems    Objective:    Physical  Exam  There were no vitals taken for this visit. Wt Readings from Last 3 Encounters:  08/23/21 158 lb (71.7 kg)  08/20/21 158 lb 4.8 oz (71.8 kg)  07/30/21 165 lb 9.1 oz (75.1 kg)     Health Maintenance Due  Topic Date Due   COVID-19 Vaccine (1) Never done   FOOT EXAM  Never done   OPHTHALMOLOGY EXAM  Never done   URINE MICROALBUMIN  Never done   Hepatitis C Screening  Never done   Zoster Vaccines- Shingrix (1 of 2) Never done   COLONOSCOPY (Pts 45-37yrs Insurance coverage will need to be confirmed)  04/05/2014   Pneumococcal Vaccine 39-72 Years old (2 - PCV) 04/12/2015   INFLUENZA VACCINE  04/29/2021    There are no preventive care  reminders to display for this patient.  Lab Results  Component Value Date   TSH 4.05 08/08/2014   Lab Results  Component Value Date   WBC 6.3 08/20/2021   HGB 14.9 08/20/2021   HCT 44.7 08/20/2021   MCV 92.4 08/20/2021   PLT 182 08/20/2021   Lab Results  Component Value Date   NA 138 08/20/2021   K 3.6 08/20/2021   CO2 23 08/20/2021   GLUCOSE 127 (H) 08/20/2021   BUN 9 08/20/2021   CREATININE 0.63 (L) 08/20/2021   BILITOT 0.5 08/20/2021   ALKPHOS 52 06/15/2021   AST 12 08/20/2021   ALT 13 08/20/2021   PROT 6.4 08/20/2021   ALBUMIN 3.6 06/15/2021   CALCIUM 8.7 08/20/2021   ANIONGAP 6 07/15/2021   Lab Results  Component Value Date   CHOL 169 09/27/2018   Lab Results  Component Value Date   HDL 36 (L) 09/27/2018   Lab Results  Component Value Date   LDLCALC 98 09/27/2018   Lab Results  Component Value Date   TRIG 78 06/27/2021   Lab Results  Component Value Date   CHOLHDL 4.7 09/27/2018   Lab Results  Component Value Date   HGBA1C 5.6 08/20/2021      Assessment & Plan:   Problem List Items Addressed This Visit   None   No orders of the defined types were placed in this encounter.   Follow-up: No follow-ups on file.    Teodora Medici, DO

## 2021-09-19 ENCOUNTER — Ambulatory Visit (INDEPENDENT_AMBULATORY_CARE_PROVIDER_SITE_OTHER): Payer: Medicare Other | Admitting: Internal Medicine

## 2021-09-19 ENCOUNTER — Encounter: Payer: Self-pay | Admitting: Internal Medicine

## 2021-09-19 VITALS — BP 138/70 | HR 106 | Temp 97.6°F | Resp 16 | Ht 65.0 in | Wt 163.6 lb

## 2021-09-19 DIAGNOSIS — R29898 Other symptoms and signs involving the musculoskeletal system: Secondary | ICD-10-CM | POA: Diagnosis not present

## 2021-09-19 DIAGNOSIS — E1169 Type 2 diabetes mellitus with other specified complication: Secondary | ICD-10-CM

## 2021-09-19 DIAGNOSIS — I609 Nontraumatic subarachnoid hemorrhage, unspecified: Secondary | ICD-10-CM

## 2021-09-19 DIAGNOSIS — H6981 Other specified disorders of Eustachian tube, right ear: Secondary | ICD-10-CM

## 2021-09-19 DIAGNOSIS — S069X9D Unspecified intracranial injury with loss of consciousness of unspecified duration, subsequent encounter: Secondary | ICD-10-CM

## 2021-09-19 DIAGNOSIS — J449 Chronic obstructive pulmonary disease, unspecified: Secondary | ICD-10-CM

## 2021-09-19 DIAGNOSIS — E669 Obesity, unspecified: Secondary | ICD-10-CM

## 2021-09-19 DIAGNOSIS — R591 Generalized enlarged lymph nodes: Secondary | ICD-10-CM

## 2021-09-19 DIAGNOSIS — Z1159 Encounter for screening for other viral diseases: Secondary | ICD-10-CM

## 2021-09-19 DIAGNOSIS — Z13220 Encounter for screening for lipoid disorders: Secondary | ICD-10-CM

## 2021-09-19 MED ORDER — FLUTICASONE PROPIONATE 50 MCG/ACT NA SUSP: 2.0000 | Freq: Every day | NASAL | 6 refills | Status: DC

## 2021-09-19 MED ORDER — ALBUTEROL SULFATE HFA 108 (90 BASE) MCG/ACT IN AERS: 2.0000 | INHALATION_SPRAY | Freq: Four times a day (QID) | RESPIRATORY_TRACT | 0 refills | Status: DC | PRN

## 2021-09-19 NOTE — Patient Instructions (Addendum)
It was great seeing you today!  Plan discussed at today's visit: -Blood work ordered today, results will be uploaded to [place] sent to pharmacy, Flonase (nasal steroid) for ear pain -Referral Neurosurgeon placed:  They tried to reach out to schedule, please call them to setup an appointment at  [facility]:  P: [phone]  Hematology/oncology referral placed  Follow up in: 1 month   Take care and let us know if you have any questions or concerns prior to your next visit.  Dr. Rosana Berger

## 2021-09-19 NOTE — Progress Notes (Signed)
Established Patient Office Visit  Subjective:  Patient ID: Randall Harvey., male    DOB: [birth year]  Age: 60 y.o. MRN: [id number]  CC:  Chief Complaint  Patient presents with   Follow-up   Diabetes    HPI Randall Harvey. presents for follow up on chronic medical conditions. He is accompanied by his partner. The patient is a poor historian. He was recently hospitalized for from September to November of this year for TBI, [facility] with IVH, skull fracture and alcohol intoxication. He was to follow up with Neurosurgery as well as community services, both of which tried to reach out to him without success. He does have a history of DVT/PE and had been on aspirin and a blood thinner and these were discontinued. He has been started on thiamine and folic acid as well as a multivitamin. Complaining of on and off headaches, numbness in left hand with decreased grip strength since hospitalization.   Diabetes, Type 2: -Last A1c 5.6 11/22, diagnosed 2 years ago -Medications: No medications -PNA vaccine: UTD -Denies symptoms of hypoglycemia, polyuria, polydipsia, numbness extremities, foot ulcers/trauma.   COPD: -COPD status: stable -Current medications: None currently -Satisfied with current treatment?:  no current treatment -Oxygen use: no -Dyspnea frequency: occasionally with going up stairs -Cough frequency: occasionally  -Wheezing: sometimes at night  -Rescue inhaler frequency: none -Limitation of activity: no -Productive cough: no -Pneumovax: Up to Date -Influenza: Not up to Date,politely declines  HLD: -Medications: None -Patient is compliant with above medications and reports no side effects.  -Last lipid panel: 12/19 TC 169, HDL 36, triglycerides 230, LDL 98, needs repeat  Lymphadenopathy: Seen incidentally on imaging. Cervical, axillary, and supraclavicular homogeneous enlargement with concern for CLL.    Past Medical History:  Diagnosis Date   Anticoagulant long-term use     lifetime use, coumadin therapy, then Xarelto   Anxiety    Arthritis    Cancer ([place])    renal   Carotid atherosclerosis    <50% left and right   Clotting disorder ([place])    COPD (chronic obstructive pulmonary disease) ([place])    Depression    Elevated liver enzymes March 2015   Fatty liver    H/O drug abuse ([place])    H/O ETOH abuse    Hepatitis    Hypertension    Obesity    Personal history of DVT (deep vein thrombosis) Oct 2014   Pre-diabetes    Pulmonary embolism and infarction [facility]) Oct 2014   Pulmonary embolism and infarction [facility]) March 2015   Pulmonary embolus with infarction [facility])    Renal cell carcinoma ([place])    Renal mass, left Oct 2014   with ureteral obstruction   Stroke ([place])    Thrombocythemia    Thrombocytopenia ([place])    Tobacco abuse     Past Surgical History:  Procedure Laterality Date   ANKLE FRACTURE SURGERY Right 12/2012   ANKLE SURGERY     EXCISION METACARPAL MASS Right 09/18/2016   Procedure: EXCISION METACARPAL MASS;  Surgeon: Hessie Knows, MD;  Location: [facility] ORS;  Service: Orthopedics;  Laterality: Right;  5th digit    HERNIA REPAIR     Umbilical Hernia   ORBITAL FRACTURE SURGERY Left    ROBOTIC ASSITED PARTIAL NEPHRECTOMY Left Oct 2014    Family History  Problem Relation Age of Onset   Cancer Mother        pancreatic   Cancer Father        colon  Diabetes Father    Cancer Sister        colon   COPD Sister    Seizures Sister    Cancer Paternal Grandmother        bone   Stroke Paternal Grandfather    Heart disease Paternal Grandfather    Cancer Sister        ovarian   Hypertension Neg Hx     Social History   Socioeconomic History   Marital status: Divorced    Spouse name: Not on file   Number of children: Not on file   Years of education: Not on file   Highest education level: Not on file  Occupational History   Not on file  Tobacco Use   Smoking status: Every Day    Packs/day: 0.50    Types: Cigarettes   Smokeless  tobacco: Never  Vaping Use   Vaping Use: Never used  Substance and Sexual Activity   Alcohol use: Yes    Comment: occasionally   Drug use: Yes    Types: Marijuana    Comment: occ   Sexual activity: Yes  Other Topics Concern   Not on file  Social History Narrative   ** Merged History Encounter **       Social Determinants of Health   Financial Resource Strain: Not on file  Food Insecurity: Not on file  Transportation Needs: Not on file  Physical Activity: Not on file  Stress: Not on file  Social Connections: Not on file  Intimate Partner Violence: Not on file    Outpatient Medications Prior to Visit  Medication Sig Dispense Refill   acetaminophen (TYLENOL) 500 MG tablet Take 2 tablets (1,000 mg total) by mouth every 8 (eight) hours as needed. 30 tablet 0   docusate sodium (COLACE) 100 MG capsule Take 1 tablet once or twice daily as needed for constipation while taking narcotic pain medicine (Patient not taking: Reported on 09/27/2018) 30 capsule 0   folic acid (FOLVITE) 1 MG tablet Take 1 tablet (1 mg total) by mouth daily. (Patient not taking: Reported on 08/20/2021) 30 tablet 0   Multiple Vitamin (MULTIVITAMIN WITH MINERALS) TABS tablet Take 1 tablet by mouth daily. (Patient not taking: Reported on 08/20/2021) 30 tablet 0   polyethylene glycol (MIRALAX / GLYCOLAX) 17 g packet Take 17 g by mouth daily. (Patient not taking: Reported on 08/20/2021) 14 each 0   thiamine 100 MG tablet Take 1 tablet (100 mg total) by mouth daily. (Patient not taking: Reported on 08/20/2021) 30 tablet 0   No facility-administered medications prior to visit.    No Known Allergies  ROS Review of Systems  Constitutional:  Negative for chills and fever.  Respiratory:  Positive for cough and shortness of breath. Negative for wheezing.   Cardiovascular:  Negative for chest pain.  Gastrointestinal:  Negative for abdominal pain.  Neurological:  Positive for weakness, numbness and headaches.      Objective:    Physical Exam Constitutional:      Appearance: Normal appearance.  HENT:     Head: Normocephalic and atraumatic.     Right Ear: Ear canal and external ear normal.     Left Ear: Tympanic membrane and ear canal normal.     Ears:     Comments: Blood in left ear canal with TM retraction on right.    Nose:     Comments: Left deviated septum Eyes:     Conjunctiva/sclera: Conjunctivae normal.  Cardiovascular:     Rate  and Rhythm: Normal rate and regular rhythm.  Pulmonary:     Effort: Pulmonary effort is normal.     Comments: Inspiratory wheezing throughout Musculoskeletal:     Right lower leg: No edema.     Left lower leg: No edema.     Comments: BUE 5/5 strength with weak left grip strength and thenar eminence atrophy  Skin:    General: Skin is warm and dry.  Neurological:     Mental Status: He is alert and oriented to person, place, and time. Mental status is at baseline.     Cranial Nerves: No cranial nerve deficit.     Sensory: No sensory deficit.     Motor: Weakness present.     Coordination: Coordination abnormal.     Gait: Gait normal.  Psychiatric:     Comments: Tearful    BP 138/70    Pulse (!) 106    Temp 97.6 F (36.4 C)    Resp 16    Ht 5\' 5"  (1.651 m)    Wt 163 lb 9.6 oz (74.2 kg)    SpO2 99%    BMI 27.22 kg/m  Wt Readings from Last 3 Encounters:  08/23/21 158 lb (71.7 kg)  08/20/21 158 lb 4.8 oz (71.8 kg)  07/30/21 165 lb 9.1 oz (75.1 kg)     Health Maintenance Due  Topic Date Due   COVID-19 Vaccine (1) Never done   FOOT EXAM  Never done   OPHTHALMOLOGY EXAM  Never done   URINE MICROALBUMIN  Never done   Hepatitis C Screening  Never done   Zoster Vaccines- Shingrix (1 of 2) Never done   COLONOSCOPY (Pts 45-18yrs Insurance coverage will need to be confirmed)  04/05/2014   Pneumococcal Vaccine 87-25 Years old (2 - PCV) 04/12/2015   INFLUENZA VACCINE  04/29/2021    There are no preventive care reminders to display for this  patient.  Lab Results  Component Value Date   TSH 4.05 08/08/2014   Lab Results  Component Value Date   WBC 6.3 08/20/2021   HGB 14.9 08/20/2021   HCT 44.7 08/20/2021   MCV 92.4 08/20/2021   PLT 182 08/20/2021   Lab Results  Component Value Date   NA 138 08/20/2021   K 3.6 08/20/2021   CO2 23 08/20/2021   GLUCOSE 127 (H) 08/20/2021   BUN 9 08/20/2021   CREATININE 0.63 (L) 08/20/2021   BILITOT 0.5 08/20/2021   ALKPHOS 52 06/15/2021   AST 12 08/20/2021   ALT 13 08/20/2021   PROT 6.4 08/20/2021   ALBUMIN 3.6 06/15/2021   CALCIUM 8.7 08/20/2021   ANIONGAP 6 07/15/2021   Lab Results  Component Value Date   CHOL 169 09/27/2018   Lab Results  Component Value Date   HDL 36 (L) 09/27/2018   Lab Results  Component Value Date   LDLCALC 98 09/27/2018   Lab Results  Component Value Date   TRIG 78 06/27/2021   Lab Results  Component Value Date   CHOLHDL 4.7 09/27/2018   Lab Results  Component Value Date   HGBA1C 5.6 08/20/2021      Assessment & Plan:   1. Subarachnoid hemorrhage (HCC)Traumatic brain injury with loss of consciousness, subsequent encounter/Decreased grip strength of left hand: Had been referred to Neurosurgery previously but never scheduled appointment. He was given the phone number today to schedule an appointment for follow up as he continues to complain of headaches. If he has a severe headache, he must return to  ED for repeat scan. The patient understands and is agreeable. We also discussed that weakness in his left hand may be permanent, can consider EMG.  2. Type 2 diabetes mellitus with obesity [facility]): Reviewed last A1c, stable at 5.6.  3. COPD, mild ([place]): Currently on no treatment, start with Albuterol as needed, follow up in 1 month.   - albuterol (VENTOLIN HFA) 108 (90 Base) MCG/ACT inhaler; Inhale 2 puffs into the lungs every 6 (six) hours as needed for wheezing or shortness of breath.  Dispense: 8 g; Refill: 0  4. Lipid screening/Need  for hepatitis C screening test: Lipid and Hep C screening today.  - Lipid Profile - Hepatitis C Antibody  5. Generalized lymphadenopathy: Per CTA 10/22. Referral to Heme/Onc placed. No abnormalities on CBC in November.   - Ambulatory referral to Hematology / Oncology  6. Acute dysfunction of right eustachian tube: Nasal steroid for right eustachian tube dysfunction.   - fluticasone (FLONASE) 50 MCG/ACT nasal spray; Place 2 sprays into both nostrils daily.  Dispense: 16 g; Refill: 6   Follow-up: Return in about 4 weeks (around 10/17/2021).    Teodora Medici, DO

## 2021-09-24 ENCOUNTER — Telehealth (INDEPENDENT_AMBULATORY_CARE_PROVIDER_SITE_OTHER): Payer: Medicare Other | Admitting: Internal Medicine

## 2021-09-24 ENCOUNTER — Ambulatory Visit: Payer: Self-pay

## 2021-09-24 DIAGNOSIS — F332 Major depressive disorder, recurrent severe without psychotic features: Secondary | ICD-10-CM | POA: Diagnosis not present

## 2021-09-24 MED ORDER — SERTRALINE HCL 25 MG PO TABS: 25.0000 mg | ORAL_TABLET | Freq: Every day | ORAL | 3 refills | Status: DC

## 2021-09-24 NOTE — Telephone Encounter (Signed)
Chief Complaint: Crying, hate people, I need help with feeling like this, need something to calm my nerves Symptoms: Crying, headache Frequency: Constant Pertinent Negatives: Patient denies CP, SOB, no other symptoms Disposition: [] ED /[] Urgent Care (no appt availability in office) / [x] Appointment(In office/virtual)/ []   Virtual Care/ [] Home Care/ [] Refused Recommended Disposition  Additional Notes: Denies thoughts of suicide, but did say he's had thoughts before wondering how, but no active plan, no weapons in the home. He says he just needs medicine to calm him down from feeling like this and doesn't want to continue to feel this way. He says if he could he would kill the people who hurt him in the past that caused him to be like this. He says he doesn't want to go to the mental hospital, just needs medicine to calm him down so that he can be around the people he loves and not curse them out. Patient is crying throughout the triage and says he doesn't have enough gas to come to the office. Advised to call 911 if thoughts of suicide, he says he will not do that because he is not having thoughts.    Reason for Disposition  Patient sounds very upset or troubled to the triager  Answer Assessment - Initial Assessment Questions 1. CONCERN: "Did anything happen that prompted you to call today?"      I don't want to hate people, but I hate the one's who done what they done to me. 2. ANXIETY SYMPTOMS: "Can you describe how you (your loved one; patient) have been feeling?" (e.g., tense, restless, panicky, anxious, keyed up, overwhelmed, sense of impending doom).      I hate people, don't want to be around people, making me a bad person around the people that I love. All I do is cry. 3. ONSET: "How long have you been feeling this way?" (e.g., hours, days, weeks)     Every since I came out of the coma 4. SEVERITY: "How would you rate the level of anxiety?" (e.g., 0 - 10; or mild, moderate,  severe).     10 (crying all the time, crying now) 5. FUNCTIONAL IMPAIRMENT: "How have these feelings affected your ability to do daily activities?" "Have you had more difficulty than usual doing your normal daily activities?" (e.g., getting better, same, worse; self-care, school, work, interactions)     N/A 6. HISTORY: "Have you felt this way before?" "Have you ever been diagnosed with an anxiety problem in the past?" (e.g., generalized anxiety disorder, panic attacks, PTSD). If Yes, ask: "How was this problem treated?" (e.g., medicines, counseling, etc.)     No 7. RISK OF HARM - SUICIDAL IDEATION: "Do you ever have thoughts of hurting or killing yourself?" If Yes, ask:  "Do you have these feelings now?" "Do you have a plan on how you would do this?"     I don't care if I live or die, but I don't have a plan 8. TREATMENT:  "What has been done so far to treat this anxiety?" (e.g., medicines, relaxation strategies). "What has helped?"     N/A 9. TREATMENT - THERAPIST: "Do you have a counselor or therapist? Name?"     N/A 10. POTENTIAL TRIGGERS: "Do you drink caffeinated beverages (e.g., coffee, colas, teas), and how much daily?" "Do you drink alcohol or use any drugs?" "Have you started any new medicines recently?"     N/A 10. PATIENT SUPPORT: "Who is with you now?" "Who do you live with?" "Do you  have family or friends who you can talk to?"        N/A 11. OTHER SYMPTOMS: "Do you have any other symptoms?" (e.g., feeling depressed, trouble concentrating, trouble sleeping, trouble breathing, palpitations or fast heartbeat, chest pain, sweating, nausea, or diarrhea)       Feeling depressed 12. PREGNANCY: "Is there any chance you are pregnant?" "When was your last menstrual period?"       N/A  Protocols used: Anxiety and Panic Attack-A-AH
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
## 2021-09-24 NOTE — Patient Instructions (Addendum)
It was great seeing you today!  Plan discussed at today's visit: -Zoloft sent to pharmacy, take this everyday  -Referral sent to start counseling, they will call you to set this up  Follow up in: 1 month   Take care and let us know if you have any questions or concerns prior to your next visit.  Dr. Rosana Berger

## 2021-09-25 ENCOUNTER — Ambulatory Visit: Payer: Self-pay | Admitting: Licensed Clinical Social Worker

## 2021-09-25 DIAGNOSIS — F411 Generalized anxiety disorder: Secondary | ICD-10-CM

## 2021-09-25 DIAGNOSIS — J449 Chronic obstructive pulmonary disease, unspecified: Secondary | ICD-10-CM

## 2021-09-25 DIAGNOSIS — E1169 Type 2 diabetes mellitus with other specified complication: Secondary | ICD-10-CM

## 2021-09-26 LAB — HEPATITIS C RNA QUANTITATIVE
HCV Quantitative Log: <1.18 Log IU/mL
HCV RNA, PCR, QN: <15 IU/mL

## 2021-09-26 LAB — LIPID PANEL
Cholesterol: 169 mg/dL (ref <200)
HDL: 49 mg/dL (ref >=40)
LDL Cholesterol (Calc): 83 mg/dL (calc)
Non-HDL Cholesterol (Calc): 120 mg/dL (calc) (ref <130)
Total CHOL/HDL Ratio: 3.4 (calc) (ref <5.0)
Triglycerides: 307 mg/dL — ABNORMAL HIGH (ref <150)

## 2021-09-26 LAB — HEPATITIS C ANTIBODY
Hepatitis C Ab: REACTIVE — AB
SIGNAL TO CUT-OFF: 5.17 — ABNORMAL HIGH (ref <1.00)
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
## 2021-10-04 ENCOUNTER — Encounter: Payer: Self-pay | Admitting: *Deleted

## 2021-10-09 ENCOUNTER — Inpatient Hospital Stay: Payer: Commercial Managed Care - HMO | Admitting: Oncology

## 2021-10-09 ENCOUNTER — Inpatient Hospital Stay: Payer: Commercial Managed Care - HMO

## 2021-10-14 ENCOUNTER — Telehealth: Payer: Self-pay | Admitting: Internal Medicine

## 2021-10-14 ENCOUNTER — Ambulatory Visit: Payer: Medicare Other | Admitting: *Deleted

## 2021-10-14 DIAGNOSIS — J449 Chronic obstructive pulmonary disease, unspecified: Secondary | ICD-10-CM

## 2021-10-14 DIAGNOSIS — F332 Major depressive disorder, recurrent severe without psychotic features: Secondary | ICD-10-CM

## 2021-10-14 DIAGNOSIS — E669 Obesity, unspecified: Secondary | ICD-10-CM

## 2021-10-14 DIAGNOSIS — E1169 Type 2 diabetes mellitus with other specified complication: Secondary | ICD-10-CM

## 2021-10-14 DIAGNOSIS — F411 Generalized anxiety disorder: Secondary | ICD-10-CM

## 2021-10-14 NOTE — Patient Instructions (Signed)
Visit Information  Thank you for taking time to visit with me today. Please don't hesitate to contact me if I can be of assistance to you before our next scheduled telephone appointment.  Following are the goals we discussed today:  Attend scheduled appointments with providers Utilize healthy coping skills and crisis resource information provided during visit Contact PCP with any questions or concerns  Our next appointment is by telephone on 10/25/21 at 1pm  Please call the care guide team at [phone] if you need to cancel or reschedule your appointment.   If you are experiencing a Mental Health or [place] or need someone to talk to, please call the Suicide and Crisis Lifeline: 988   The patient verbalized understanding of instructions, educational materials, and care plan provided today and declined offer to receive copy of patient instructions, educational materials, and care plan.   Telephone follow up appointment with care management team member scheduled for:10/25/21  Elliot Gurney, [place] Worker  [facility] Care Management [phone]

## 2021-10-14 NOTE — Chronic Care Management (AMB) (Signed)
Chronic Care Management    Clinical Social Work Note  10/14/2021 Name: Randall Harvey. MRN: [id number] DOB: 05/12/1961  Randall Harvey. is a 61 y.o. year old male who is a primary care patient of Randall Medici, DO. The CCM team was consulted to assist the patient with chronic disease management and/or care coordination needs related to: [employer]  and [place] and Resources.   Engaged with patient by telephone for follow up visit in response to provider referral for social work chronic care management and care coordination services.   Consent to Services:  The patient was given information about Chronic Care Management services, agreed to services, and gave verbal consent prior to initiation of services.  Please see initial visit note for detailed documentation.   Patient agreed to services and consent obtained.   Assessment: Review of patient past medical history, allergies, medications, and health status, including review of relevant consultants reports was performed today as part of a comprehensive evaluation and provision of chronic care management and care coordination services.     SDOH (Social Determinants of Health) assessments and interventions performed:    Advanced Directives Status: Not addressed in this encounter.  CCM Care Plan  No Known Allergies  Outpatient Encounter Medications as of 10/14/2021  Medication Sig   acetaminophen (TYLENOL) 500 MG tablet Take 2 tablets (1,000 mg total) by mouth every 8 (eight) hours as needed.   albuterol (VENTOLIN HFA) 108 (90 Base) MCG/ACT inhaler Inhale 2 puffs into the lungs every 6 (six) hours as needed for wheezing or shortness of breath.   docusate sodium (COLACE) 100 MG capsule Take 1 tablet once or twice daily as needed for constipation while taking narcotic pain medicine   fluticasone (FLONASE) 50 MCG/ACT nasal spray Place 2 sprays into both nostrils daily.   folic acid (FOLVITE) 1 MG tablet Take 1  tablet (1 mg total) by mouth daily.   Multiple Vitamin (MULTIVITAMIN WITH MINERALS) TABS tablet Take 1 tablet by mouth daily.   polyethylene glycol (MIRALAX / GLYCOLAX) 17 g packet Take 17 g by mouth daily.   sertraline (ZOLOFT) 25 MG tablet Take 1 tablet (25 mg total) by mouth daily.   thiamine 100 MG tablet Take 1 tablet (100 mg total) by mouth daily.   No facility-administered encounter medications on file as of 10/14/2021.    Patient Active Problem List   Diagnosis Date Noted   Subarachnoid hemorrhage ([place]) 08/20/2021   Traumatic brain injury with loss of consciousness ([place]) 08/20/2021   Generalized lymphadenopathy 08/20/2021   Malnutrition of moderate degree 06/20/2021   Skull fracture ([place]) 06/15/2021   Type 2 diabetes mellitus with obesity ([place]) 10/05/2018   Hypertriglyceridemia 09/27/2018   DVT of lower limb, acute ([place]) 01/05/2018   Marijuana use 08/08/2016   Anxiety disorder 09/01/2015   Obesity 09/01/2015   Tobacco abuse 09/01/2015   Fatty liver 09/01/2015   Ganglion cyst of flexor tendon sheath of finger 08/28/2015   Knee pain, bilateral 08/28/2015   Pulmonary embolus ([place]) 08/28/2015   Erectile dysfunction 08/28/2015   Positive 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 03/21/2015   Renal cell carcinoma ([place]) 02/22/2015   COPD, mild ([place]) 06/06/2014    Conditions to be addressed/monitored:  Anxiety and Depression; Limited social support, Mental Health Concerns , and Family and relationship dysfunction  Care Plan : LCSW Plan of Care  Updates made by Randall Claude, LCSW since 10/14/2021 12:00 AM     Problem: Coping Skills (General Plan of Care)  Long-Range Goal: Coping Skills Enhanced   Start Date: 09/25/2021  Recent Progress: On track  Priority: High  Note:   Current barriers:   Severe Persistent Mental Health needs related to Depression and Anxiety Limited social support, Mental Health Concerns , and Family and relationship dysfunction Needs Support, Education, and  Care Coordination in order to meet unmet mental health needs. Clinical Goal(s): demonstrate a reduction in symptoms related to :Anxiety with hx of TBI   Clinical Interventions:  Assessed patient's previous and current treatment, coping skills, support system and barriers to care Patient confirmed plan to cancel appointment with the [facility] due to lack of transportation-due to late notice-this social worker made attempts but was not able to arrange transportation due to short notice Transportation options discussed including use of transportation benefit through his [facility], as well as [facility] transport and possible transport through the [employer] Patient continues to report difficulty managing depression and anxiety symptoms triggered by stress and extensive hx of trauma. Patient's symptoms, however have reportedly improved, patient discussed decease in crying spells, irritability, and feeling overwhelmed-sleep has improved Patient's support system explored, patient confirms that he has a sister he confides in, went to church with son last week who were very supportive Patient participates in medication management through PCP. He has filled medication however reports that he stopped taking the medication due to side effects experienced. Patient encouraged to discuss medication concerns with provider CCM LCSW provided validation and encouragement. Discussed follow up and referral for ongoing mental health counseling to process trauma history. Patient declined referral for mental health follow up at this time but is willing to focus on transportation resources to get to medical appointments Active listening / Reflection utilized  Emotional Support Provided Provided psychoeducation for mental health needs  Provided brief CBT   Participation in counseling encouraged  Verbalization of feelings encouraged   Review resources, discussed options and provided patient information about   Options for mental health treatment based on need and insurance 1:1 collaboration with primary care provider regarding development and update of comprehensive plan of care as evidenced by provider attestation and co-signature Inter-disciplinary care team collaboration (see longitudinal plan of care) Patient Goals/Self-Care Activities: Over the next 120 days Attend scheduled appointments with providers Utilize healthy coping skills and crisis resource information provided during visit Contact PCP with any questions or concerns        Follow Up Plan: SW will follow up with patient by phone over the next 7-14 business days      [place], [place] Worker  [facility] Care Management [phone]

## 2021-10-14 NOTE — Telephone Encounter (Signed)
Pt is calling Chronic Care Management.  Pt had to d/c the call during his appt today. Please advise [phone]

## 2021-10-15 ENCOUNTER — Inpatient Hospital Stay: Payer: Commercial Managed Care - HMO | Admitting: Oncology

## 2021-10-15 ENCOUNTER — Inpatient Hospital Stay: Payer: Commercial Managed Care - HMO

## 2021-10-21 ENCOUNTER — Ambulatory Visit: Payer: Medicare Other | Admitting: Internal Medicine

## 2021-10-21 NOTE — Progress Notes (Incomplete)
Established Patient Office Visit  Subjective:  Patient ID: Randall Harvey., male    DOB: 1961/09/13  Age: 61 y.o. MRN: [id number]  CC: No chief complaint on file.   HPI [name] presents for follow up.   Hx of Subarachnoid hemorrhage/TBI: Referral placed to Neurosurgery - ???    Diabetes, Type 2: -Last A1c 5.6 11/22, diagnosed 2 years ago -Medications: No medications -PNA vaccine: UTD -Denies symptoms of hypoglycemia, polyuria, polydipsia, numbness extremities, foot ulcers/trauma.    COPD: -COPD status: stable -Current medications: Albuterol PRN -Satisfied with current treatment?:  no current treatment -Oxygen use: no -Dyspnea frequency: occasionally with going up stairs -Cough frequency: occasionally  -Wheezing: sometimes at night  -Rescue inhaler frequency: none -Limitation of activity: no -Productive cough: no -Pneumovax: Up to Date -Influenza: Not up to Date,politely declines   HLD: -Medications: None -Patient is compliant with above medications and reports no side effects.  -Last lipid panel: 12/22 TC 169, HDL 49, triglycerides 307, LDL 83  The 10-year ASCVD risk score (Arnett DK, et al., 2019) is: 27.2%   Values used to calculate the score:     Age: 49 years     Sex: Male     Is Non-Hispanic African American: No     Diabetic: Yes     Tobacco smoker: Yes     Systolic Blood Pressure: 329 mmHg     Is BP treated: No     HDL Cholesterol: 49 mg/dL     Total Cholesterol: 169 mg/dL   Lymphadenopathy: Seen incidentally on imaging. Cervical, axillary, and supraclavicular homogeneous enlargement with concern for CLL. Referral placed to Heme/Onc ?   MDD: -Mood status: {Blank single:19197::"controlled","uncontrolled","better","worse","exacerbated","stable"} -Current treatment: Zoloft 25 -Satisfied with current treatment?: {Blank single:19197::"yes","no"} -Symptom severity: {Blank single:19197::"mild","moderate","severe"}  -Duration of current treatment :  {Blank single:19197::"chronic","months","years"} -Side effects: {Blank single:19197::"yes","no"} Medication compliance: {Blank single:19197::"excellent compliance","good compliance","fair compliance","poor compliance"} Psychotherapy/counseling: {Blank single:19197::"yes","no"} {Blank single:19197::"current","in the past"} Previous psychiatric medications: {Blank multiple:19196::"abilify","amitryptiline","buspar","celexa","cymbalta","depakote","effexor","lamictal","lexapro","lithium","nortryptiline","paxil","prozac","pristiq (desvenlafaxine","seroquel","wellbutrin","zoloft","zyprexa"} Depressed mood: {Blank single:19197::"yes","no"} Anxious mood: {Blank single:19197::"yes","no"} Anhedonia: {Blank single:19197::"yes","no"} Significant weight loss or gain: {Blank single:19197::"yes","no"} Insomnia: {Blank single:19197::"yes","no"} {Blank single:19197::"hard to fall asleep","hard to stay asleep"} Fatigue: {Blank single:19197::"yes","no"} Feelings of worthlessness or guilt: {Blank single:19197::"yes","no"} Impaired concentration/indecisiveness: {Blank single:19197::"yes","no"} Suicidal ideations: {Blank single:19197::"yes","no"} Hopelessness: {Blank single:19197::"yes","no"} Crying spells: {Blank single:19197::"yes","no"} Depression screen [facility] 2/9 09/24/2021 09/19/2021 08/20/2021 09/27/2018 08/23/2018  Decreased Interest 3 0 0 1 0  Down, Depressed, Hopeless 3 0 0 1 0  PHQ - 2 Score 6 0 0 2 0  Altered sleeping 1 0 0 1 1  Tired, decreased energy 1 0 0 1 0  Change in appetite 0 0 0 0 0  Feeling bad or failure about yourself  3 0 0 0 0  Trouble concentrating 0 0 0 0 0  Moving slowly or fidgety/restless 0 0 0 0 0  Suicidal thoughts 1 0 0 0 0  PHQ-9 Score 12 0 0 4 1  Difficult doing work/chores Extremely dIfficult Not difficult at all Not difficult at all Very difficult Not difficult at all     Past Medical History:  Diagnosis Date   Alcohol intoxication ([place])    Anticoagulant long-term use     lifetime use, coumadin therapy, then Xarelto   Anxiety    Arthritis    Cancer ([place])    renal   Carotid atherosclerosis    <50% left and right   Clotting disorder ([place])    COPD (chronic obstructive pulmonary disease) ([place])    Depression  Elevated liver enzymes 11/2013   Family history of cancer    Fatty liver    H/O drug abuse ([place])    H/O ETOH abuse    Hepatitis    History of traumatic brain injury    Hypertension    Lymphadenopathy    Obesity    Personal history of DVT (deep vein thrombosis) 06/2013   Pre-diabetes    Primary osteoarthritis of left knee    Pulmonary embolism and infarction ([place]) 06/2013   Pulmonary embolism and infarction ([place]) 11/2013   Pulmonary embolus with infarction [facility])    Renal cell carcinoma ([place])    Renal mass, left 06/2013   with ureteral obstruction   Stroke ([place])    Subarachnoid hemorrhage following injury    Thrombocythemia    Thrombocytopenia ([place])    Tobacco abuse     Past Surgical History:  Procedure Laterality Date   ANKLE FRACTURE SURGERY Right 12/2012   ANKLE SURGERY     EXCISION METACARPAL MASS Right 09/18/2016   Procedure: EXCISION METACARPAL MASS;  Surgeon: Hessie Knows, MD;  Location: [facility] ORS;  Service: Orthopedics;  Laterality: Right;  5th digit    HERNIA REPAIR     Umbilical Hernia   ORBITAL FRACTURE SURGERY Left    ROBOTIC ASSITED PARTIAL NEPHRECTOMY Left Oct 2014    Family History  Problem Relation Age of Onset   Pancreatic cancer Mother    Colon cancer Father    Diabetes Father    Colon cancer Sister    COPD Sister    Seizures Sister    Ovarian cancer Sister    Cancer Paternal Grandmother        bone   Stroke Paternal Grandfather    Heart disease Paternal Grandfather    Hypertension Neg Hx     Social History   Socioeconomic History   Marital status: Divorced    Spouse name: Not on file   Number of children: Not on file   Years of education: Not on file   Highest education level: Not on file   Occupational History   Not on file  Tobacco Use   Smoking status: Every Day    Packs/day: 0.50    Types: Cigarettes   Smokeless tobacco: Never  Vaping Use   Vaping Use: Never used  Substance and Sexual Activity   Alcohol use: Yes    Comment: occasionally   Drug use: Yes    Types: Marijuana    Comment: occ   Sexual activity: Yes  Other Topics Concern   Not on file  Social History Narrative   ** Merged History Encounter **       Social Determinants of Health   Financial Resource Strain: Not on file  Food Insecurity: No Food Insecurity   Worried About Charity fundraiser in the Last Year: Never true   Ran Out of Food in the Last Year: Never true  Transportation Needs: No Transportation Needs   Lack of Transportation (Medical): No   Lack of Transportation (Non-Medical): No  Physical Activity: Not on file  Stress: Not on file  Social Connections: Not on file  Intimate Partner Violence: Unknown   Fear of Current or Ex-Partner: No   Emotionally Abused: No   Physically Abused: No   Sexually Abused: Not on file    Outpatient Medications Prior to Visit  Medication Sig Dispense Refill   acetaminophen (TYLENOL) 500 MG tablet Take 2 tablets (1,000 mg total) by mouth every 8 (eight) hours as needed.  30 tablet 0   albuterol (VENTOLIN HFA) 108 (90 Base) MCG/ACT inhaler Inhale 2 puffs into the lungs every 6 (six) hours as needed for wheezing or shortness of breath. 8 g 0   docusate sodium (COLACE) 100 MG capsule Take 1 tablet once or twice daily as needed for constipation while taking narcotic pain medicine 30 capsule 0   fluticasone (FLONASE) 50 MCG/ACT nasal spray Place 2 sprays into both nostrils daily. 16 g 6   folic acid (FOLVITE) 1 MG tablet Take 1 tablet (1 mg total) by mouth daily. 30 tablet 0   Multiple Vitamin (MULTIVITAMIN WITH MINERALS) TABS tablet Take 1 tablet by mouth daily. 30 tablet 0   polyethylene glycol (MIRALAX / GLYCOLAX) 17 g packet Take 17 g by mouth daily.  14 each 0   sertraline (ZOLOFT) 25 MG tablet Take 1 tablet (25 mg total) by mouth daily. 30 tablet 3   thiamine 100 MG tablet Take 1 tablet (100 mg total) by mouth daily. 30 tablet 0   No facility-administered medications prior to visit.    No Known Allergies  ROS Review of Systems    Objective:    Physical Exam  There were no vitals taken for this visit. Wt Readings from Last 3 Encounters:  09/19/21 163 lb 9.6 oz (74.2 kg)  08/23/21 158 lb (71.7 kg)  08/20/21 158 lb 4.8 oz (71.8 kg)     Health Maintenance Due  Topic Date Due   COVID-19 Vaccine (1) Never done   FOOT EXAM  Never done   OPHTHALMOLOGY EXAM  Never done   URINE MICROALBUMIN  Never done   Zoster Vaccines- Shingrix (1 of 2) Never done   COLONOSCOPY (Pts 45-78yrs Insurance coverage will need to be confirmed)  04/05/2014    There are no preventive care reminders to display for this patient.  Lab Results  Component Value Date   TSH 4.05 08/08/2014   Lab Results  Component Value Date   WBC 6.3 08/20/2021   HGB 14.9 08/20/2021   HCT 44.7 08/20/2021   MCV 92.4 08/20/2021   PLT 182 08/20/2021   Lab Results  Component Value Date   NA 138 08/20/2021   K 3.6 08/20/2021   CO2 23 08/20/2021   GLUCOSE 127 (H) 08/20/2021   BUN 9 08/20/2021   CREATININE 0.63 (L) 08/20/2021   BILITOT 0.5 08/20/2021   ALKPHOS 52 06/15/2021   AST 12 08/20/2021   ALT 13 08/20/2021   PROT 6.4 08/20/2021   ALBUMIN 3.6 06/15/2021   CALCIUM 8.7 08/20/2021   ANIONGAP 6 07/15/2021   Lab Results  Component Value Date   CHOL 169 09/19/2021   Lab Results  Component Value Date   HDL 49 09/19/2021   Lab Results  Component Value Date   LDLCALC 83 09/19/2021   Lab Results  Component Value Date   TRIG 307 (H) 09/19/2021   Lab Results  Component Value Date   CHOLHDL 3.4 09/19/2021   Lab Results  Component Value Date   HGBA1C 5.6 08/20/2021      Assessment & Plan:   Problem List Items Addressed This Visit    None   No orders of the defined types were placed in this encounter.   Follow-up: No follow-ups on file.    Teodora Medici, DO

## 2021-10-25 ENCOUNTER — Telehealth: Payer: Self-pay | Admitting: *Deleted

## 2021-10-25 ENCOUNTER — Telehealth: Payer: Medicare Other

## 2021-10-25 NOTE — Telephone Encounter (Signed)
°  Care Management   Follow Up Note   10/25/2021 Name: Randall Harvey. MRN: [id number] DOB: [birth year]   Referred by: Teodora Medici, DO Reason for referral : Care Coordination   An unsuccessful telephone outreach was attempted today. The patient was referred to the case management team for assistance with care management and care coordination.   Follow Up Plan: Telephone follow up appointment with care management team member  re-scheduled for 10/28/21  Elliot Gurney, [place] Worker  [facility] Care Management [phone]

## 2021-10-28 ENCOUNTER — Telehealth: Payer: Self-pay | Admitting: *Deleted

## 2021-10-28 ENCOUNTER — Telehealth: Payer: Medicare Other | Admitting: *Deleted

## 2021-10-28 NOTE — Telephone Encounter (Signed)
°  Care Management   Follow Up Note   10/28/2021 Name: Randall Harvey. MRN: [id number] DOB: [birth year]   Referred by: Teodora Medici, DO Reason for referral : Care Coordination   A second unsuccessful telephone outreach was attempted today. The patient was referred to the case management team for assistance with care management and care coordination.   Follow Up Plan: Telephone follow up appointment with care management team member scheduled for: 11/04/21  .Elliot Gurney, [place] Administrator, [facility] [phone]

## 2021-10-29 ENCOUNTER — Other Ambulatory Visit: Payer: Self-pay

## 2021-10-29 DIAGNOSIS — Z1211 Encounter for screening for malignant neoplasm of colon: Secondary | ICD-10-CM

## 2021-11-04 ENCOUNTER — Telehealth: Payer: Medicare Other | Admitting: *Deleted

## 2021-11-04 ENCOUNTER — Telehealth: Payer: Self-pay | Admitting: *Deleted

## 2021-11-04 ENCOUNTER — Inpatient Hospital Stay: Payer: Medicare Other | Admitting: Oncology

## 2021-11-04 ENCOUNTER — Inpatient Hospital Stay: Payer: Medicare Other

## 2021-11-04 NOTE — Telephone Encounter (Addendum)
°  Care Management   Follow Up Note   11/04/2021 Name: Randall Harvey. MRN: [id number] DOB: 02/14/1961   Referred by: Teodora Medici, DO Reason for referral : Care Coordination   Third unsuccessful telephone outreach was attempted today. The patient was referred to the case management team for assistance with care management and care coordination. The patient's primary care provider has been notified of our unsuccessful attempts to make or maintain contact with the patient. The care management team is pleased to engage with this patient at any time in the future should he/she be interested in assistance from the care management team.   Follow Up Plan: We have been unable to make contact with the patient for follow up. The care management team is available to follow up with the patient after provider conversation with the patient regarding recommendation for care management engagement and subsequent re-referral to the care management team.   Elliot Gurney, [place] [facility] Care Management [phone]

## 2021-11-13 ENCOUNTER — Telehealth: Payer: Self-pay | Admitting: Internal Medicine

## 2021-11-13 NOTE — Telephone Encounter (Signed)
Copied from [place] [phone]. Topic: Medicare AWV >> Nov 13, 2021 10:42 AM Cher Nakai R wrote: Reason for CRM:   Left message for patient to call back and schedule Medicare Annual Wellness Visit (AWV) in office.   If unable to come into the office for AWV,  please offer to do virtually or by telephone.  No hx of AWV eligible for AWVI as of 06/29/2016 per [facility]  Please schedule at anytime with Lilly.      40 Minutes appointment   Any questions, please call me at [phone]

## 2021-12-17 ENCOUNTER — Other Ambulatory Visit: Payer: Self-pay | Admitting: Family Medicine

## 2021-12-17 DIAGNOSIS — I609 Nontraumatic subarachnoid hemorrhage, unspecified: Secondary | ICD-10-CM

## 2021-12-17 DIAGNOSIS — S069X9D Unspecified intracranial injury with loss of consciousness of unspecified duration, subsequent encounter: Secondary | ICD-10-CM

## 2021-12-17 DIAGNOSIS — J449 Chronic obstructive pulmonary disease, unspecified: Secondary | ICD-10-CM

## 2022-01-16 IMAGING — CR DG CHEST 2V
1 series · 2 of 2 positions shown · non-contrast
Comparison: 02/22/2019

CLINICAL DATA: Shortness of breath and fever

EXAM:
CHEST - 2 VIEW

[Series 1: dg chest 2 view · 0.14mm/px · 2 of 2 slices shown]
[im 1/2]
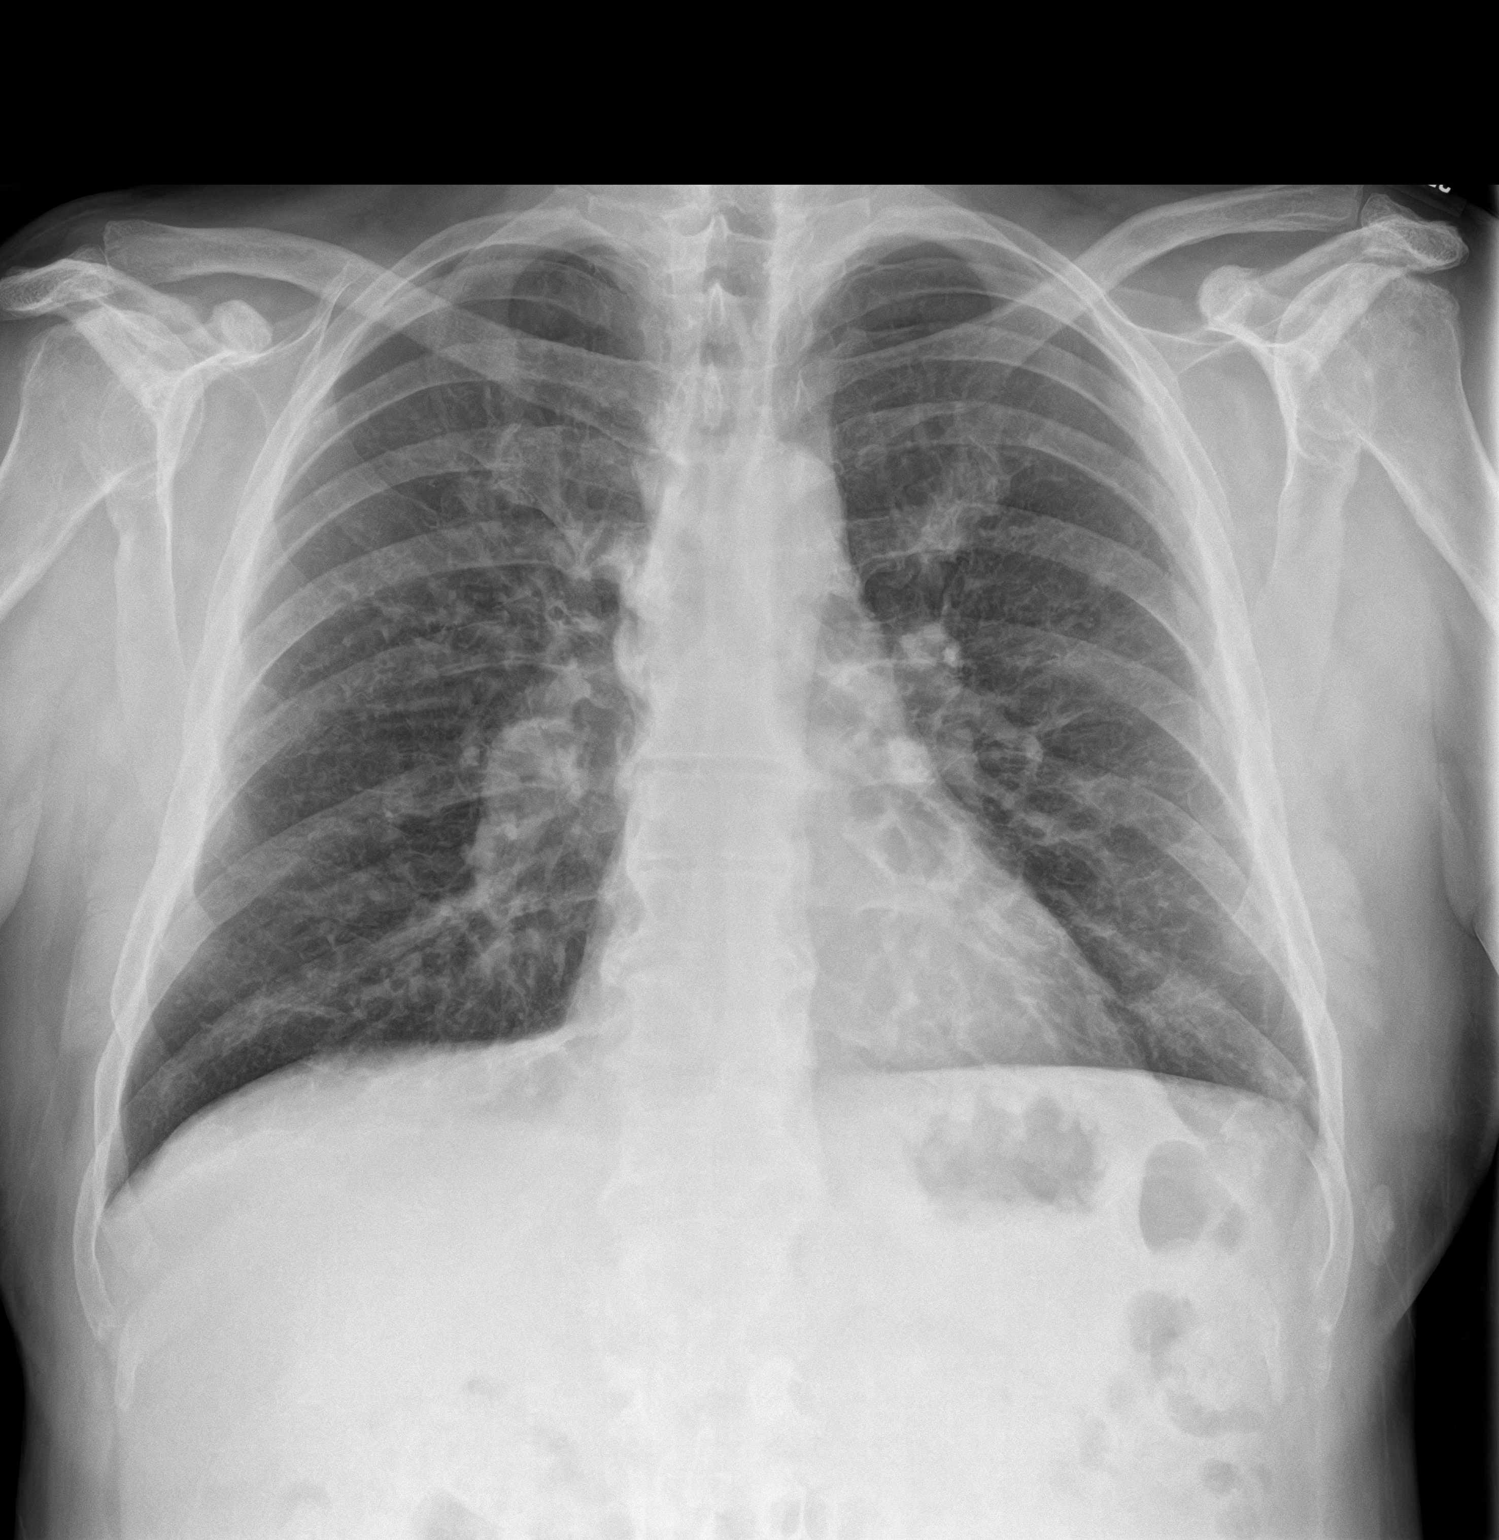
[im 2/2]
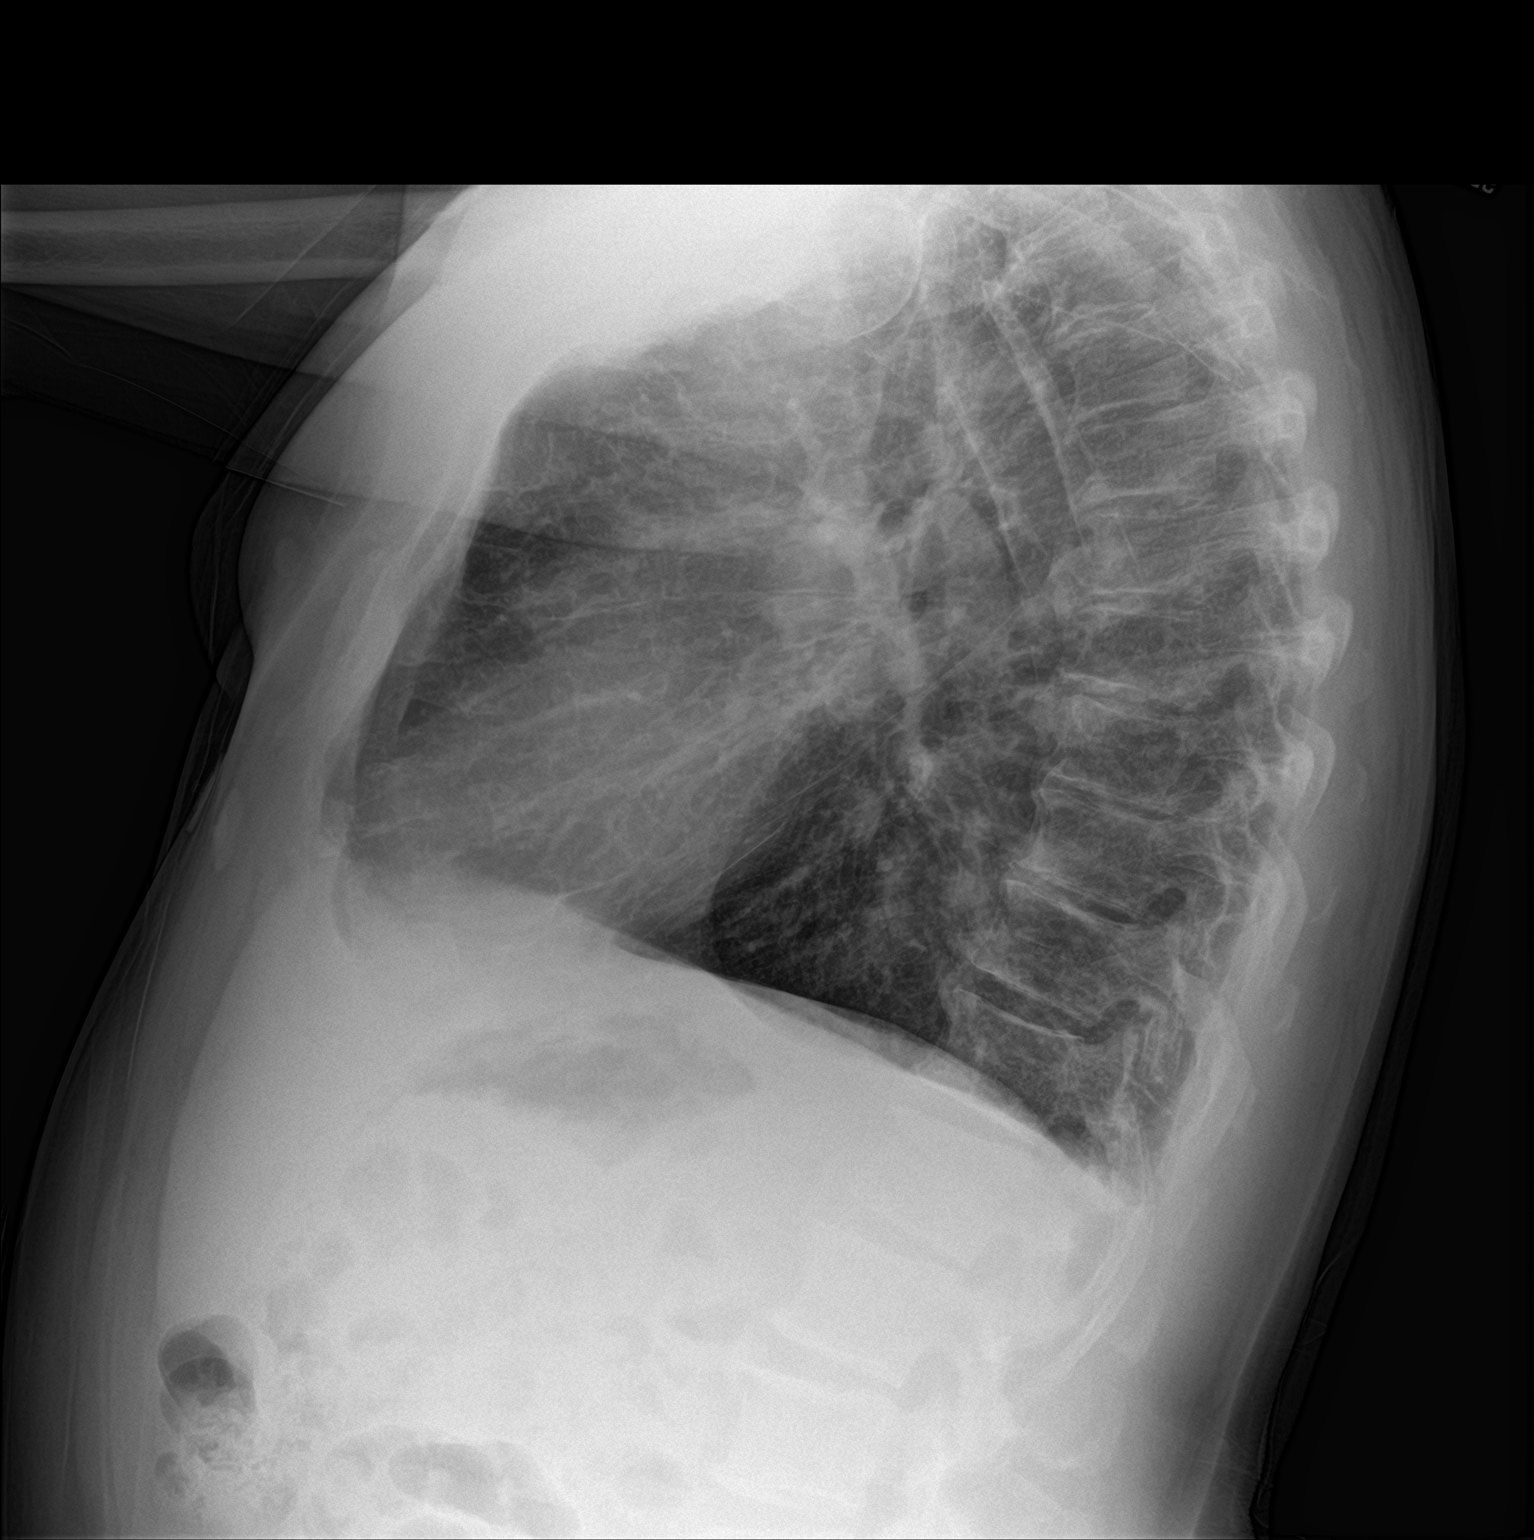

[2 of 2 positions shown; findings below may reference images not displayed]

FINDINGS: Cardiac shadow is within normal limits. The lungs are well aerated
bilaterally. Exuberant calcification at the first costochondral
margin is noted. No focal infiltrate or sizable effusion is seen. No
bony abnormality is noted.
IMPRESSION: No active cardiopulmonary disease.

## 2022-01-17 ENCOUNTER — Emergency Department
Admission: EM | Admit: 2022-01-17 | Discharge: 2022-01-18 | Disposition: A | Discharge status: Home / Self Care | Payer: Medicare Other | Attending: Emergency Medicine | Admitting: Emergency Medicine

## 2022-01-17 DIAGNOSIS — Z20822 Contact with and (suspected) exposure to covid-19: Secondary | ICD-10-CM | POA: Insufficient documentation

## 2022-01-17 DIAGNOSIS — J449 Chronic obstructive pulmonary disease, unspecified: Secondary | ICD-10-CM | POA: Insufficient documentation

## 2022-01-17 DIAGNOSIS — R45850 Homicidal ideations: Secondary | ICD-10-CM | POA: Insufficient documentation

## 2022-01-17 DIAGNOSIS — F1094 Alcohol use, unspecified with alcohol-induced mood disorder: Secondary | ICD-10-CM | POA: Diagnosis not present

## 2022-01-17 DIAGNOSIS — R456 Violent behavior: Secondary | ICD-10-CM | POA: Diagnosis not present

## 2022-01-17 DIAGNOSIS — F1024 Alcohol dependence with alcohol-induced mood disorder: Secondary | ICD-10-CM | POA: Diagnosis not present

## 2022-01-17 DIAGNOSIS — E119 Type 2 diabetes mellitus without complications: Secondary | ICD-10-CM | POA: Insufficient documentation

## 2022-01-17 DIAGNOSIS — R4689 Other symptoms and signs involving appearance and behavior: Secondary | ICD-10-CM

## 2022-01-17 DIAGNOSIS — Y908 Blood alcohol level of 240 mg/100 ml or more: Secondary | ICD-10-CM | POA: Insufficient documentation

## 2022-01-17 DIAGNOSIS — F1410 Cocaine abuse, uncomplicated: Secondary | ICD-10-CM

## 2022-01-17 LAB — COMPREHENSIVE METABOLIC PANEL
ALT: 35 U/L (ref 0–44)
AST: 43 U/L — ABNORMAL HIGH (ref 15–41)
Albumin: 3.9 g/dL (ref 3.5–5.0)
Alkaline Phosphatase: 61 U/L (ref 38–126)
Anion gap: 12 (ref 5–15)
BUN: 18 mg/dL (ref 8–23)
CO2: 22 mmol/L (ref 22–32)
Calcium: 8.8 mg/dL — ABNORMAL LOW (ref 8.9–10.3)
Chloride: 104 mmol/L (ref 98–111)
Creatinine, Ser: 0.99 mg/dL (ref 0.61–1.24)
GFR, Estimated: >60 mL/min (ref >60)
Glucose, Bld: 141 mg/dL — ABNORMAL HIGH (ref 70–99)
Potassium: 3.9 mmol/L (ref 3.5–5.1)
Sodium: 138 mmol/L (ref 135–145)
Total Bilirubin: 1 mg/dL (ref 0.3–1.2)
Total Protein: 7.5 g/dL (ref 6.5–8.1)

## 2022-01-17 LAB — CBC
HCT: 45.7 % (ref 39.0–52.0)
Hemoglobin: 15.5 g/dL (ref 13.0–17.0)
MCH: 33.8 pg (ref 26.0–34.0)
MCHC: 33.9 g/dL (ref 30.0–36.0)
MCV: 99.8 fL (ref 80.0–100.0)
Platelets: 197 10*3/uL (ref 150–400)
RBC: 4.58 MIL/uL (ref 4.22–5.81)
RDW: 12.5 % (ref 11.5–15.5)
WBC: 10 10*3/uL (ref 4.0–10.5)
nRBC: 0 % (ref 0.0–0.2)

## 2022-01-17 LAB — ACETAMINOPHEN LEVEL: Acetaminophen (Tylenol), Serum: <10 ug/mL — ABNORMAL LOW (ref 10–30)

## 2022-01-17 LAB — RESP PANEL BY RT-PCR (FLU A&B, COVID) ARPGX2
Influenza A by PCR: NEGATIVE
Influenza B by PCR: NEGATIVE
SARS Coronavirus 2 by RT PCR: NEGATIVE

## 2022-01-17 LAB — ETHANOL: Alcohol, Ethyl (B): 249 mg/dL — ABNORMAL HIGH (ref <10)

## 2022-01-17 LAB — SALICYLATE LEVEL: Salicylate Lvl: <7 mg/dL — ABNORMAL LOW (ref 7.0–30.0)

## 2022-01-17 MED ORDER — HALOPERIDOL LACTATE 5 MG/ML IJ SOLN
5.0000 mg | Freq: Once | INTRAMUSCULAR | Status: AC
  Administered 2022-01-17: 5 mg via INTRAMUSCULAR
  Filled 2022-01-17: qty 1

## 2022-01-17 MED ORDER — ADULT MULTIVITAMIN W/MINERALS CH
1.0000 | ORAL_TABLET | Freq: Every day | ORAL | Status: DC
  Administered 2022-01-18: 1 via ORAL
  Filled 2022-01-17: qty 1

## 2022-01-17 MED ORDER — SERTRALINE HCL 50 MG PO TABS
25.0000 mg | ORAL_TABLET | Freq: Every day | ORAL | Status: DC
  Administered 2022-01-18: 25 mg via ORAL
  Filled 2022-01-17: qty 1

## 2022-01-17 MED ORDER — ALBUTEROL SULFATE HFA 108 (90 BASE) MCG/ACT IN AERS: 2.0000 | INHALATION_SPRAY | Freq: Four times a day (QID) | RESPIRATORY_TRACT | Status: DC | PRN

## 2022-01-17 MED ORDER — THIAMINE HCL 100 MG PO TABS
100.0000 mg | ORAL_TABLET | Freq: Every day | ORAL | Status: DC
  Administered 2022-01-18: 100 mg via ORAL
  Filled 2022-01-17: qty 1

## 2022-01-17 MED ORDER — FLUTICASONE PROPIONATE 50 MCG/ACT NA SUSP: 2.0000 | Freq: Every day | NASAL | Status: DC | PRN

## 2022-01-17 MED ORDER — DIPHENHYDRAMINE HCL 50 MG/ML IJ SOLN
50.0000 mg | Freq: Once | INTRAMUSCULAR | Status: AC
Start: 2022-01-17 — End: 2022-01-17
  Administered 2022-01-17: 50 mg via INTRAMUSCULAR
  Filled 2022-01-17: qty 1

## 2022-01-17 MED ORDER — LORAZEPAM 2 MG/ML IJ SOLN
2.0000 mg | Freq: Once | INTRAMUSCULAR | Status: AC
  Administered 2022-01-17: 2 mg via INTRAMUSCULAR
  Filled 2022-01-17: qty 1

## 2022-01-17 MED ORDER — FOLIC ACID 1 MG PO TABS
1.0000 mg | ORAL_TABLET | Freq: Every day | ORAL | Status: DC
  Administered 2022-01-18: 1 mg via ORAL
  Filled 2022-01-17: qty 1

## 2022-01-17 NOTE — ED Notes (Signed)
Dinner placed at side. ?

## 2022-01-17 NOTE — ED Notes (Signed)
Pt acting belligerent, yelling, cursing and threatening staff. Pt continues to yell racial slurs and states that he is going to kill the people "who did this to me".  ?EDP notified. ?

## 2022-01-17 NOTE — ED Provider Notes (Signed)
? ?[facility] ?Provider Note ? ? ? Event Date/Time  ? First MD Initiated Contact with Patient 01/17/22 1549   ?  (approximate) ? ? ?History  ? ?Aggressive Behavior ? ? ?HPI ? ?Randall Harvey. is a 61 y.o. male  who, per discharge summary dated 07/30/2021 had admission for [facility] with skull fracture, who presents to the emergency department for unclear reasons. He was found in the waiting room yelling and on my exam is agitated and stating he wants to kill the people who hurt him. He is unable to give any significant history.  ? ?Physical Exam  ? ?Triage Vital Signs: ?ED Triage Vitals  ?Enc Vitals Group  ?   BP 01/17/22 1548 (!) 142/111  ?   Pulse Rate 01/17/22 1548 (!) 115  ?   Resp 01/17/22 1548 (!) 26  ?   Temp 01/17/22 1548 98 ?F (36.7 ?C)  ?   Temp Source 01/17/22 1548 Oral  ?   SpO2 01/17/22 1548 98 %  ?   Weight --   ?   Height 01/17/22 1552 '5\' 5"'$  (1.651 m)  ?   Head Circumference --   ?   Peak Flow --   ?   Pain Score 01/17/22 1552 0  ? ?Most recent vital signs: ?Vitals:  ? 01/17/22 1548  ?BP: (!) 142/111  ?Pulse: (!) 115  ?Resp: (!) 26  ?Temp: 98 ?F (36.7 ?C)  ?SpO2: 98%  ? ?General: Awake, agitated, yelling. ?CV:  Good peripheral perfusion.  ?Resp:  Normal effort.  ?Abd:  No distention.  ?Psych:  Agitated. Endorsing HI. ? ? ?ED Results / Procedures / Treatments  ? ?Labs ?(all labs ordered are listed, but only abnormal results are displayed) ?Labs Reviewed  ?COMPREHENSIVE METABOLIC PANEL - Abnormal; Notable for the following components:  ?    Result Value  ? Glucose, Bld 141 (*)   ? Calcium 8.8 (*)   ? AST 43 (*)   ? All other components within normal limits  ?ETHANOL - Abnormal; Notable for the following components:  ? Alcohol, Ethyl (B) 249 (*)   ? All other components within normal limits  ?SALICYLATE LEVEL - Abnormal; Notable for the following components:  ? Salicylate Lvl <5.2 (*)   ? All other components within normal limits  ?ACETAMINOPHEN LEVEL - Abnormal; Notable for the following  components:  ? Acetaminophen (Tylenol), Serum <10 (*)   ? All other components within normal limits  ?RESP PANEL BY RT-PCR (FLU A&B, COVID) ARPGX2  ?CBC  ?URINE DRUG SCREEN, QUALITATIVE ([facility] ONLY)  ? ? ? ?EKG ? ?None ? ? ?RADIOLOGY ?None ? ? ?PROCEDURES: ? ?Critical Care performed: No ? ?Procedures ? ? ?MEDICATIONS ORDERED IN ED: ?Medications  ?diphenhydrAMINE (BENADRYL) injection 50 mg (50 mg Intramuscular Given 01/17/22 1604)  ?LORazepam (ATIVAN) injection 2 mg (2 mg Intramuscular Given 01/17/22 1604)  ?haloperidol lactate (HALDOL) injection 5 mg (5 mg Intramuscular Given 01/17/22 1604)  ? ? ? ?IMPRESSION / MDM / ASSESSMENT AND PLAN / ED COURSE  ?I reviewed the triage vital signs and the nursing notes. ?             ?               ? ?Differential diagnosis includes, but is not limited to, substance abuse, psychiatric illness, anger. ? ?Patient presented to the emergency department for unclear reasons, was aggressive on initial evaluation. Was yelling about wanting to kill people. Given concern for safety  of patient and staff he was given medication to help calm him. Blood work was notable for elevated ethanol which could be contributing to the patient's behavior. Given HI patient was placed under IVC. Will have psychiatry evaluate.  ? ?The patient has been placed in psychiatric observation due to the need to provide a safe environment for the patient while obtaining psychiatric consultation and evaluation, as well as ongoing medical and medication management to treat the patient's condition.  The patient has been placed under full IVC at this time. ? ? ? ?FINAL CLINICAL IMPRESSION(S) / ED DIAGNOSES  ? ?Final diagnoses:  ?Homicidal ideation  ? ? ? ?Note:  This document was prepared using Dragon voice recognition software and may include unintentional dictation errors. ? ?  ?Nance Pear, MD ?01/17/22 1936 ? ?

## 2022-01-17 NOTE — ED Triage Notes (Signed)
Patient to ER, patient visualized sitting down in the lobby, yelling "God damn I fucking hate people, you are all useless pieces of shit". Patient then asked why he is here, states he wants to shoot everyone, that he "doesn't deserve any of this shit" and that he wants to "kill all these N words".  ? ?Patient walked back to the quad with security presence, patient continuously stating that people are trying to hurt him and threatens to kill staff. Unable to answer any triage questions due to cursing and threats.  ?

## 2022-01-17 NOTE — ED Notes (Signed)
Report from [facility]. Patient sleeping, respirations regular and unlabored. Q15 minute rounds and observation by Engineer, drilling to continue. ? ?

## 2022-01-17 NOTE — ED Notes (Signed)
Pt dressed out by this tech and April, security. Belongings as follows: ?1 white tshirt ?1 grey shorts ?1 blue underwear ?2 white socks ?2 black sandals ?1 green visor hat ?1 black sunglasses ?1 black wallet ?1 set of keys ?1 black smartphone ?

## 2022-01-17 NOTE — Consult Note (Signed)
Client will be properly assessed when he is sober as he is currently saying inappropriate things.  His current behaviors and language are not tolerated at [facility] and offensive to our team members.   ? ? ?

## 2022-01-17 NOTE — ED Notes (Addendum)
Pt voluntarily takes IM injections due to agitation after RN explained plan of care and of involuntary commitment. Pt lying in bed and lets arms lie at sides to administer injections by this RN and Levada Dy, Therapist, sports. Security standing at bedside. ?

## 2022-01-18 ENCOUNTER — Emergency Department
Admission: EM | Admit: 2022-01-18 | Discharge: 2022-01-18 | Disposition: A | Discharge status: Home / Self Care | Payer: Medicare Other | Source: Home / Self Care | Attending: Emergency Medicine | Admitting: Emergency Medicine

## 2022-01-18 ENCOUNTER — Other Ambulatory Visit: Payer: Self-pay

## 2022-01-18 DIAGNOSIS — J449 Chronic obstructive pulmonary disease, unspecified: Secondary | ICD-10-CM | POA: Insufficient documentation

## 2022-01-18 DIAGNOSIS — F32A Depression, unspecified: Secondary | ICD-10-CM

## 2022-01-18 DIAGNOSIS — I10 Essential (primary) hypertension: Secondary | ICD-10-CM | POA: Insufficient documentation

## 2022-01-18 DIAGNOSIS — E119 Type 2 diabetes mellitus without complications: Secondary | ICD-10-CM | POA: Insufficient documentation

## 2022-01-18 DIAGNOSIS — Z8553 Personal history of malignant neoplasm of renal pelvis: Secondary | ICD-10-CM | POA: Insufficient documentation

## 2022-01-18 DIAGNOSIS — F1094 Alcohol use, unspecified with alcohol-induced mood disorder: Secondary | ICD-10-CM | POA: Diagnosis present

## 2022-01-18 LAB — URINE DRUG SCREEN, QUALITATIVE (ARMC ONLY)
Amphetamines, Ur Screen: NOT DETECTED
Barbiturates, Ur Screen: NOT DETECTED
Benzodiazepine, Ur Scrn: POSITIVE — AB
Cannabinoid 50 Ng, Ur ~~LOC~~: NOT DETECTED
Cocaine Metabolite,Ur ~~LOC~~: POSITIVE — AB
MDMA (Ecstasy)Ur Screen: NOT DETECTED
Methadone Scn, Ur: NOT DETECTED
Opiate, Ur Screen: NOT DETECTED
Phencyclidine (PCP) Ur S: NOT DETECTED
Tricyclic, Ur Screen: NOT DETECTED

## 2022-01-18 MED ORDER — ONDANSETRON 4 MG PO TBDP: 4.0000 mg | ORAL_TABLET | Freq: Once | ORAL | Status: DC

## 2022-01-18 NOTE — ED Notes (Addendum)
Patient dressed out at this time, belongings include: ?Green cap ?White t-shirt ?Grey shorts ?White socks ?Brown sandals ?Cell phone ?Black underwear ?Broken black glasses ?
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
## 2022-01-18 NOTE — ED Notes (Signed)
Belongings returned to pt who is getting dressed to leave. Belongings as follows: ?1 white tshirt ?1 grey shorts ?1 blue underwear ?2 white socks ?2 black sandals ?1 green visor hat ?1 black sunglasses ?1 black wallet ?1 set of keys ?1 black smartphone ?

## 2022-01-18 NOTE — Discharge Instructions (Addendum)
[facility]  Mental health service in [place], [place]  Address: [address], [place], [place] [address] Hours:  Closed ? Opens 8?AM Mon Phone: [phone] 

## 2022-01-18 NOTE — ED Notes (Addendum)
Emergency contact [place] [phone] ?

## 2022-01-18 NOTE — ED Notes (Signed)
Pt told registration he is hungry and thirsty. Pt yelling in room. No words, only agitated sounds.  ?

## 2022-01-18 NOTE — ED Notes (Signed)
Pt yelling in room. Informed pt will provide water and food. Also this RN had entered diet order for pt. Pt then stating "I want to go outside". Told pt he can either stay or leave but cannot be in and out of hospital. Provider went to room and talked again with pt who states he wants to leave. Provider printing AVS now. ?

## 2022-01-18 NOTE — ED Notes (Signed)
Pt belongings given back to pt: ? ?Green cap ?White t-shirt ?Grey shorts ?White socks ?Brown sandals ?Cell phone ?Black underwear ?Broken black glasses ? ?AVS handed to pt. Pt is getting dressed and will walk out.  ?

## 2022-01-18 NOTE — ED Notes (Signed)
IVC/Psych Consult still pending ?
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
## 2022-01-18 NOTE — ED Provider Notes (Signed)
Emergency Medicine Observation Re-evaluation Note ? ?Randall Harvey. is a 61 y.o. male, seen on rounds today.  Pt initially presented to the ED for complaints of Aggressive Behavior ?Currently, the patient is sleeping. ? ?Physical Exam  ?BP 119/73 (BP Location: Right Arm)   Pulse 86   Temp 98.4 ?F (36.9 ?C)   Resp 20   Ht '5\' 5"'$  (1.651 m)   SpO2 98%   BMI 27.22 kg/m?  ?Physical Exam ?Gen: No acute distress  ?Resp: Normal rise and fall of chest ?Neuro: Moving all four extremities ?Psych: Resting currently, calm and cooperative when awake ? ? ? ?ED Course / MDM  ?EKG:  ? ?I have reviewed the labs performed to date as well as medications administered while in observation.  Recent changes in the last 24 hours include no acute events overnight. ? ?Plan  ?Current plan is for psychiatric disposition. ?Randall Harvey. is under involuntary commitment. ?  ? ?  ?, Randall Bison, DO ?01/18/22 0441 ? ?

## 2022-01-18 NOTE — ED Triage Notes (Signed)
Patient to ER with complaints of suicidal ideation. Reports he was discharged this morning, but states that he "does not need to be out in society", tearful in triage.  ?

## 2022-01-27 ENCOUNTER — Telehealth: Payer: Self-pay | Admitting: Internal Medicine

## 2022-01-27 NOTE — Telephone Encounter (Signed)
Copied from [place]. Topic: Medicare AWV ?>> Jan 27, 2022  2:55 PM Cher Nakai R wrote: ?Reason for CRM:  ? ?Left message for patient to call back and schedule Medicare Annual Wellness Visit (AWV) in office.  ? ?If unable to come into the office for AWV,  please offer to do virtually or by telephone. ? ?No hx of AWV eligible for AWVI per [employer] as of 06/29/2016  ? ?Please schedule at anytime with [place].     ? ?45 minute appointment  ? ?Any questions, please call me at [phone] ?

## 2022-02-04 ENCOUNTER — Emergency Department
Admission: EM | Admit: 2022-02-04 | Discharge: 2022-02-04 | Discharge status: Against Medical Advice | Payer: Medicare Other

## 2022-02-04 ENCOUNTER — Ambulatory Visit: Payer: Medicare Other

## 2022-02-04 ENCOUNTER — Other Ambulatory Visit: Payer: Self-pay

## 2022-02-04 ENCOUNTER — Emergency Department
Admission: EM | Admit: 2022-02-04 | Discharge: 2022-02-04 | Disposition: A | Discharge status: Home / Self Care | Payer: Medicare Other | Attending: Emergency Medicine | Admitting: Emergency Medicine

## 2022-02-04 DIAGNOSIS — S0512XA Contusion of eyeball and orbital tissues, left eye, initial encounter: Secondary | ICD-10-CM | POA: Insufficient documentation

## 2022-02-04 DIAGNOSIS — S0592XA Unspecified injury of left eye and orbit, initial encounter: Secondary | ICD-10-CM | POA: Diagnosis present

## 2022-02-04 DIAGNOSIS — F10920 Alcohol use, unspecified with intoxication, uncomplicated: Secondary | ICD-10-CM | POA: Insufficient documentation

## 2022-02-04 DIAGNOSIS — W1830XA Fall on same level, unspecified, initial encounter: Secondary | ICD-10-CM | POA: Insufficient documentation

## 2022-02-04 DIAGNOSIS — R519 Headache, unspecified: Secondary | ICD-10-CM | POA: Diagnosis not present

## 2022-02-04 DIAGNOSIS — Y9301 Activity, walking, marching and hiking: Secondary | ICD-10-CM | POA: Diagnosis not present

## 2022-02-04 DIAGNOSIS — R6889 Other general symptoms and signs: Secondary | ICD-10-CM | POA: Diagnosis not present

## 2022-02-04 DIAGNOSIS — I499 Cardiac arrhythmia, unspecified: Secondary | ICD-10-CM | POA: Diagnosis not present

## 2022-02-04 DIAGNOSIS — W19XXXA Unspecified fall, initial encounter: Secondary | ICD-10-CM | POA: Diagnosis not present

## 2022-02-04 DIAGNOSIS — Z743 Need for continuous supervision: Secondary | ICD-10-CM | POA: Diagnosis not present
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
## 2022-02-04 NOTE — ED Notes (Signed)
Pt's sisters in lobby to check on patient.  Pt gave officer in quad permission to speak with his family.  ?

## 2022-02-04 NOTE — ED Notes (Signed)
Pt verbally aggressive with Animal nutritionist. ?

## 2022-02-04 NOTE — ED Notes (Addendum)
Pt verbally aggressive, using racial slurs,  threatening and belligerent.   Refusing to stay in bed.  ? ?EDP arrived at bedside and determined patient needed to be removed from the ER.   ? ?

## 2022-02-04 NOTE — ED Notes (Signed)
Pt escorted out of ER by quad officer. ?

## 2022-02-12 ENCOUNTER — Emergency Department: Payer: Medicare Other

## 2022-02-12 ENCOUNTER — Other Ambulatory Visit: Payer: Self-pay

## 2022-02-12 ENCOUNTER — Emergency Department
Admission: EM | Admit: 2022-02-12 | Discharge: 2022-02-12 | Disposition: A | Discharge status: Home / Self Care | Payer: Medicare Other | Attending: Emergency Medicine | Admitting: Emergency Medicine

## 2022-02-12 DIAGNOSIS — S065X0A Traumatic subdural hemorrhage without loss of consciousness, initial encounter: Secondary | ICD-10-CM | POA: Insufficient documentation

## 2022-02-12 DIAGNOSIS — I10 Essential (primary) hypertension: Secondary | ICD-10-CM | POA: Diagnosis not present

## 2022-02-12 DIAGNOSIS — W01198A Fall on same level from slipping, tripping and stumbling with subsequent striking against other object, initial encounter: Secondary | ICD-10-CM | POA: Insufficient documentation

## 2022-02-12 DIAGNOSIS — S065XAA Traumatic subdural hemorrhage with loss of consciousness status unknown, initial encounter: Secondary | ICD-10-CM

## 2022-02-12 DIAGNOSIS — Y92003 Bedroom of unspecified non-institutional (private) residence as the place of occurrence of the external cause: Secondary | ICD-10-CM | POA: Insufficient documentation

## 2022-02-12 DIAGNOSIS — R7989 Other specified abnormal findings of blood chemistry: Secondary | ICD-10-CM | POA: Insufficient documentation

## 2022-02-12 DIAGNOSIS — S0990XA Unspecified injury of head, initial encounter: Secondary | ICD-10-CM | POA: Diagnosis present

## 2022-02-12 DIAGNOSIS — R519 Headache, unspecified: Secondary | ICD-10-CM | POA: Diagnosis not present

## 2022-02-12 LAB — COMPREHENSIVE METABOLIC PANEL
ALT: 47 U/L — ABNORMAL HIGH (ref 0–44)
AST: 69 U/L — ABNORMAL HIGH (ref 15–41)
Albumin: 3.9 g/dL (ref 3.5–5.0)
Alkaline Phosphatase: 76 U/L (ref 38–126)
Anion gap: 12 (ref 5–15)
BUN: 15 mg/dL (ref 8–23)
CO2: 22 mmol/L (ref 22–32)
Calcium: 9 mg/dL (ref 8.9–10.3)
Chloride: 103 mmol/L (ref 98–111)
Creatinine, Ser: 0.86 mg/dL (ref 0.61–1.24)
GFR, Estimated: >60 mL/min (ref >60)
Glucose, Bld: 128 mg/dL — ABNORMAL HIGH (ref 70–99)
Potassium: 4.2 mmol/L (ref 3.5–5.1)
Sodium: 137 mmol/L (ref 135–145)
Total Bilirubin: 0.7 mg/dL (ref 0.3–1.2)
Total Protein: 7.6 g/dL (ref 6.5–8.1)

## 2022-02-12 LAB — CBC
HCT: 48.5 % (ref 39.0–52.0)
Hemoglobin: 16.1 g/dL (ref 13.0–17.0)
MCH: 34 pg (ref 26.0–34.0)
MCHC: 33.2 g/dL (ref 30.0–36.0)
MCV: 102.3 fL — ABNORMAL HIGH (ref 80.0–100.0)
Platelets: 182 10*3/uL (ref 150–400)
RBC: 4.74 MIL/uL (ref 4.22–5.81)
RDW: 11.9 % (ref 11.5–15.5)
WBC: 7.4 10*3/uL (ref 4.0–10.5)
nRBC: 0 % (ref 0.0–0.2)

## 2022-02-12 MED ORDER — BUTALBITAL-APAP-CAFFEINE 50-325-40 MG PO TABS: 1.0000 | ORAL_TABLET | Freq: Four times a day (QID) | ORAL | 0 refills | Status: AC | PRN

## 2022-02-12 NOTE — ED Notes (Signed)
ED Provider at bedside. 
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
## 2022-02-12 NOTE — ED Notes (Signed)
Pt. In CT. 

## 2022-02-12 NOTE — ED Notes (Signed)
Pt. Requested urinal, given urinal, 241m urine output. Pt. Resting comfortably. Denies further need at this time. ?

## 2022-02-12 NOTE — ED Notes (Signed)
Pt. Provided pillow, and warm blankets per request. Pt. Notified he is NPO until after his second CT scan. ?

## 2022-02-12 NOTE — Discharge Instructions (Addendum)
Please call the number provided for neurosurgery to arrange a follow-up appointment for recheck/reevaluation.  You have been prescribed a migraine medication.  Please take this medicine as needed for headache as written.  Do not drink alcohol while taking this medication.  Return to the emergency department for any further head injury, any weakness numbness confusion slurred speech or any other symptom personally concerning to yourself. ?

## 2022-02-12 NOTE — ED Notes (Signed)
Pt. Given [place] sandwich and drink, finished without difficulty. ?
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
## 2022-02-12 NOTE — ED Provider Notes (Signed)
Patient received in signout from Dr. Kerman Passey pending repeat CT head for evaluation of stability of subdural hematoma.  Repeat CT is reviewed by me with no evidence of expansion of his acute on chronic subdural hematoma.  Per original plan of care, we will discharge with referral to neurosurgery and return precautions for the ED. ? ?.Critical Care ?Performed by: Vladimir Crofts, MD ?Authorized by: Vladimir Crofts, MD  ? ?Critical care provider statement:  ?  Critical care time (minutes):  30 ?  Critical care time was exclusive of:  Separately billable procedures and treating other patients ?  Critical care was necessary to treat or prevent imminent or life-threatening deterioration of the following conditions:  CNS failure or compromise ?  Critical care was time spent personally by me on the following activities:  Development of treatment plan with patient or surrogate, discussions with consultants, evaluation of patient's response to treatment, examination of patient, ordering and review of laboratory studies, ordering and review of radiographic studies, ordering and performing treatments and interventions, pulse oximetry, re-evaluation of patient's condition and review of old charts ?.1-3 Lead EKG Interpretation ?Performed by: Vladimir Crofts, MD ?Authorized by: Vladimir Crofts, MD  ? ?  Interpretation: normal   ?  ECG rate:  80 ?  ECG rate assessment: normal   ?  Rhythm: sinus rhythm   ?  Ectopy: none   ?  Conduction: normal   ? ?  ?Vladimir Crofts, MD ?02/12/22 1826 ? ?

## 2022-02-20 ENCOUNTER — Other Ambulatory Visit: Payer: Self-pay

## 2022-02-20 ENCOUNTER — Emergency Department
Admission: EM | Admit: 2022-02-20 | Discharge: 2022-02-21 | Disposition: A | Discharge status: Home / Self Care | Payer: Medicare Other | Attending: Emergency Medicine | Admitting: Emergency Medicine

## 2022-02-20 ENCOUNTER — Encounter: Payer: Self-pay | Admitting: *Deleted

## 2022-02-20 DIAGNOSIS — F1094 Alcohol use, unspecified with alcohol-induced mood disorder: Secondary | ICD-10-CM | POA: Diagnosis present

## 2022-02-20 DIAGNOSIS — E119 Type 2 diabetes mellitus without complications: Secondary | ICD-10-CM | POA: Diagnosis not present

## 2022-02-20 DIAGNOSIS — F10120 Alcohol abuse with intoxication, uncomplicated: Secondary | ICD-10-CM | POA: Diagnosis not present

## 2022-02-20 DIAGNOSIS — R45851 Suicidal ideations: Secondary | ICD-10-CM | POA: Diagnosis not present

## 2022-02-20 DIAGNOSIS — F10920 Alcohol use, unspecified with intoxication, uncomplicated: Secondary | ICD-10-CM

## 2022-02-20 DIAGNOSIS — J449 Chronic obstructive pulmonary disease, unspecified: Secondary | ICD-10-CM | POA: Diagnosis not present

## 2022-02-20 DIAGNOSIS — F1014 Alcohol abuse with alcohol-induced mood disorder: Secondary | ICD-10-CM | POA: Diagnosis present

## 2022-02-20 DIAGNOSIS — Y908 Blood alcohol level of 240 mg/100 ml or more: Secondary | ICD-10-CM | POA: Diagnosis not present

## 2022-02-20 LAB — COMPREHENSIVE METABOLIC PANEL
ALT: 39 U/L (ref 0–44)
AST: 71 U/L — ABNORMAL HIGH (ref 15–41)
Albumin: 4.2 g/dL (ref 3.5–5.0)
Alkaline Phosphatase: 63 U/L (ref 38–126)
Anion gap: 7 (ref 5–15)
BUN: 14 mg/dL (ref 8–23)
CO2: 25 mmol/L (ref 22–32)
Calcium: 8.7 mg/dL — ABNORMAL LOW (ref 8.9–10.3)
Chloride: 106 mmol/L (ref 98–111)
Creatinine, Ser: 0.94 mg/dL (ref 0.61–1.24)
GFR, Estimated: >60 mL/min (ref >60)
Glucose, Bld: 106 mg/dL — ABNORMAL HIGH (ref 70–99)
Potassium: 3.9 mmol/L (ref 3.5–5.1)
Sodium: 138 mmol/L (ref 135–145)
Total Bilirubin: 0.7 mg/dL (ref 0.3–1.2)
Total Protein: 7.8 g/dL (ref 6.5–8.1)

## 2022-02-20 LAB — CBC
HCT: 48.8 % (ref 39.0–52.0)
Hemoglobin: 16 g/dL (ref 13.0–17.0)
MCH: 33.5 pg (ref 26.0–34.0)
MCHC: 32.8 g/dL (ref 30.0–36.0)
MCV: 102.3 fL — ABNORMAL HIGH (ref 80.0–100.0)
Platelets: 200 10*3/uL (ref 150–400)
RBC: 4.77 MIL/uL (ref 4.22–5.81)
RDW: 11.9 % (ref 11.5–15.5)
WBC: 9.4 10*3/uL (ref 4.0–10.5)
nRBC: 0 % (ref 0.0–0.2)

## 2022-02-20 LAB — ACETAMINOPHEN LEVEL: Acetaminophen (Tylenol), Serum: <10 ug/mL — ABNORMAL LOW (ref 10–30)

## 2022-02-20 LAB — ETHANOL: Alcohol, Ethyl (B): 249 mg/dL — ABNORMAL HIGH (ref <10)

## 2022-02-20 LAB — SALICYLATE LEVEL: Salicylate Lvl: <7 mg/dL — ABNORMAL LOW (ref 7.0–30.0)

## 2022-02-20 MED ORDER — IBUPROFEN 600 MG PO TABS: 600.0000 mg | ORAL_TABLET | Freq: Three times a day (TID) | ORAL | Status: DC | PRN

## 2022-02-20 MED ORDER — HALOPERIDOL LACTATE 5 MG/ML IJ SOLN
5.0000 mg | Freq: Once | INTRAMUSCULAR | Status: AC
  Administered 2022-02-20: 5 mg via INTRAMUSCULAR
  Filled 2022-02-20: qty 1

## 2022-02-20 MED ORDER — ALUM & MAG HYDROXIDE-SIMETH 200-200-20 MG/5ML PO SUSP: 30.0000 mL | Freq: Four times a day (QID) | ORAL | Status: DC | PRN

## 2022-02-20 MED ORDER — HALOPERIDOL LACTATE 5 MG/ML IJ SOLN: 10.0000 mg | Freq: Once | INTRAMUSCULAR | Status: DC

## 2022-02-20 MED ORDER — LORAZEPAM 2 MG/ML IJ SOLN
2.0000 mg | Freq: Once | INTRAMUSCULAR | Status: AC
  Administered 2022-02-20: 2 mg via INTRAMUSCULAR
  Filled 2022-02-20: qty 1

## 2022-02-20 MED ORDER — DIPHENHYDRAMINE HCL 50 MG/ML IJ SOLN
50.0000 mg | Freq: Once | INTRAMUSCULAR | Status: AC
  Administered 2022-02-20: 50 mg via INTRAMUSCULAR
  Filled 2022-02-20: qty 1

## 2022-02-20 NOTE — ED Triage Notes (Signed)
Pt states he is SI and HI.  Etoh use today.  Denies drug use.  Pt states get me a noose to hang myself.

## 2022-02-20 NOTE — ED Notes (Signed)
IVC/Consult ordered/pending 
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
## 2022-02-20 NOTE — BH Assessment (Signed)
Patient currently unable to participate in assessment due to medications.

## 2022-02-21 DIAGNOSIS — F1094 Alcohol use, unspecified with alcohol-induced mood disorder: Secondary | ICD-10-CM
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
## 2022-02-21 NOTE — ED Notes (Signed)
IVC/Consult Pending/ Pt medicated

## 2022-02-21 NOTE — ED Notes (Signed)
Patient given belongings back per RN.

## 2022-02-21 NOTE — ED Notes (Signed)
Report given to [facility] nurse and prepared for transport
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
## 2022-02-21 NOTE — ED Notes (Signed)
Pt's lunch has arrived, Probation officer provided lunch for pt.

## 2022-02-21 NOTE — ED Notes (Signed)
Patient ambulated over to the BHU, No signs of aggression or stress, Patient denies Si/hi , resting in room 3 with TV on, will continue to monitor.

## 2022-02-21 NOTE — ED Notes (Signed)
Pt up to restroom with steady gait.

## 2022-02-21 NOTE — Discharge Instructions (Signed)
Follow-up with [facility].  Please return as needed.

## 2022-02-22 DIAGNOSIS — I6200 Nontraumatic subdural hemorrhage, unspecified: Secondary | ICD-10-CM | POA: Diagnosis not present

## 2022-02-22 DIAGNOSIS — G319 Degenerative disease of nervous system, unspecified: Secondary | ICD-10-CM | POA: Diagnosis not present

## 2022-02-24 DIAGNOSIS — I4510 Unspecified right bundle-branch block: Secondary | ICD-10-CM | POA: Diagnosis not present

## 2022-02-24 DIAGNOSIS — J449 Chronic obstructive pulmonary disease, unspecified: Secondary | ICD-10-CM | POA: Diagnosis not present

## 2022-02-26 ENCOUNTER — Other Ambulatory Visit: Payer: Self-pay | Admitting: Neurosurgery

## 2022-02-26 DIAGNOSIS — S065XAA Traumatic subdural hemorrhage with loss of consciousness status unknown, initial encounter: Secondary | ICD-10-CM

## 2022-04-05 ENCOUNTER — Encounter: Payer: Self-pay | Admitting: Emergency Medicine

## 2022-04-05 ENCOUNTER — Emergency Department
Admission: EM | Admit: 2022-04-05 | Discharge: 2022-04-06 | Disposition: A | Discharge status: Home / Self Care | Payer: Medicare Other | Attending: Emergency Medicine | Admitting: Emergency Medicine

## 2022-04-05 ENCOUNTER — Other Ambulatory Visit: Payer: Self-pay

## 2022-04-05 DIAGNOSIS — I10 Essential (primary) hypertension: Secondary | ICD-10-CM | POA: Insufficient documentation

## 2022-04-05 DIAGNOSIS — F10129 Alcohol abuse with intoxication, unspecified: Secondary | ICD-10-CM | POA: Insufficient documentation

## 2022-04-05 DIAGNOSIS — Z20822 Contact with and (suspected) exposure to covid-19: Secondary | ICD-10-CM | POA: Diagnosis not present

## 2022-04-05 DIAGNOSIS — F17210 Nicotine dependence, cigarettes, uncomplicated: Secondary | ICD-10-CM | POA: Diagnosis not present

## 2022-04-05 DIAGNOSIS — Y908 Blood alcohol level of 240 mg/100 ml or more: Secondary | ICD-10-CM | POA: Diagnosis not present

## 2022-04-05 DIAGNOSIS — F10920 Alcohol use, unspecified with intoxication, uncomplicated: Secondary | ICD-10-CM | POA: Diagnosis not present

## 2022-04-05 DIAGNOSIS — R45851 Suicidal ideations: Secondary | ICD-10-CM | POA: Diagnosis not present

## 2022-04-05 DIAGNOSIS — Z7901 Long term (current) use of anticoagulants: Secondary | ICD-10-CM | POA: Diagnosis not present

## 2022-04-05 DIAGNOSIS — Z85528 Personal history of other malignant neoplasm of kidney: Secondary | ICD-10-CM | POA: Diagnosis not present

## 2022-04-05 DIAGNOSIS — E119 Type 2 diabetes mellitus without complications: Secondary | ICD-10-CM | POA: Insufficient documentation

## 2022-04-05 DIAGNOSIS — F10929 Alcohol use, unspecified with intoxication, unspecified: Secondary | ICD-10-CM | POA: Diagnosis present

## 2022-04-05 DIAGNOSIS — J449 Chronic obstructive pulmonary disease, unspecified: Secondary | ICD-10-CM | POA: Insufficient documentation

## 2022-04-05 LAB — RESP PANEL BY RT-PCR (FLU A&B, COVID) ARPGX2
Influenza A by PCR: NEGATIVE
Influenza B by PCR: NEGATIVE
SARS Coronavirus 2 by RT PCR: NEGATIVE

## 2022-04-05 LAB — CBC
HCT: 49.1 % (ref 39.0–52.0)
Hemoglobin: 16.3 g/dL (ref 13.0–17.0)
MCH: 32.9 pg (ref 26.0–34.0)
MCHC: 33.2 g/dL (ref 30.0–36.0)
MCV: 99.2 fL (ref 80.0–100.0)
Platelets: 189 10*3/uL (ref 150–400)
RBC: 4.95 MIL/uL (ref 4.22–5.81)
RDW: 11.9 % (ref 11.5–15.5)
WBC: 12.1 10*3/uL — ABNORMAL HIGH (ref 4.0–10.5)
nRBC: 0 % (ref 0.0–0.2)

## 2022-04-05 LAB — COMPREHENSIVE METABOLIC PANEL
ALT: 42 U/L (ref 0–44)
AST: 43 U/L — ABNORMAL HIGH (ref 15–41)
Albumin: 4.4 g/dL (ref 3.5–5.0)
Alkaline Phosphatase: 61 U/L (ref 38–126)
Anion gap: 9 (ref 5–15)
BUN: 23 mg/dL (ref 8–23)
CO2: 18 mmol/L — ABNORMAL LOW (ref 22–32)
Calcium: 9 mg/dL (ref 8.9–10.3)
Chloride: 108 mmol/L (ref 98–111)
Creatinine, Ser: 0.92 mg/dL (ref 0.61–1.24)
GFR, Estimated: >60 mL/min (ref >60)
Glucose, Bld: 112 mg/dL — ABNORMAL HIGH (ref 70–99)
Potassium: 3.9 mmol/L (ref 3.5–5.1)
Sodium: 135 mmol/L (ref 135–145)
Total Bilirubin: 1 mg/dL (ref 0.3–1.2)
Total Protein: 7.9 g/dL (ref 6.5–8.1)

## 2022-04-05 LAB — ETHANOL: Alcohol, Ethyl (B): 287 mg/dL — ABNORMAL HIGH (ref <10)

## 2022-04-05 MED ORDER — HALOPERIDOL LACTATE 5 MG/ML IJ SOLN
10.0000 mg | Freq: Once | INTRAMUSCULAR | Status: AC
  Administered 2022-04-05: 10 mg via INTRAMUSCULAR
  Filled 2022-04-05: qty 2

## 2022-04-05 MED ORDER — DIPHENHYDRAMINE HCL 50 MG/ML IJ SOLN
50.0000 mg | Freq: Once | INTRAMUSCULAR | Status: AC
  Administered 2022-04-05: 50 mg via INTRAMUSCULAR
  Filled 2022-04-05: qty 1

## 2022-04-05 NOTE — ED Notes (Signed)
Pt asleep during snack
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
## 2022-04-05 NOTE — ED Notes (Signed)
Pt yelling, verbally aggressive, using racial slurs and refusing to enter his room.  Pt intoxicated and belligerent.  Pt is disruptive and offensive to other patients.  EDP made aware.  IM medications given with manual hold by security officers.
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
## 2022-04-05 NOTE — ED Notes (Addendum)
Patient belongings include Black shorts White t-shirt White socks Purple bandada White nike sneakers Multiple bags of groceries   X2 bags of belongings tagged and handed to primary RN

## 2022-04-06 DIAGNOSIS — F10929 Alcohol use, unspecified with intoxication, unspecified: Secondary | ICD-10-CM | POA: Diagnosis present

## 2022-04-06 DIAGNOSIS — F10920 Alcohol use, unspecified with intoxication, uncomplicated: Secondary | ICD-10-CM

## 2022-04-06 NOTE — ED Notes (Signed)
Pt states if he can get a ride home he won't check back in. Pt denies any SI or HI. Pt states he just wants to go home.

## 2022-04-06 NOTE — ED Notes (Signed)
Pt to be discharged

## 2022-04-06 NOTE — Consult Note (Signed)
[facility] Consult   Reason for Consult:  alcohol intoxication and aggression Referring Physician:  EDP Patient Identification: Randall Harvey. MRN:  [id number] Principal Diagnosis: Alcohol intoxication ([place]) Diagnosis:  Principal Problem:   Alcohol intoxication ([place])   Total Time spent with patient: 45 minutes  Subjective:   Randall Belmar. is a 61 y.o. male patient admitted with alcohol intoxication.  HPI:  This is a 61 year old male brought into the E.R. by the police for alcohol intoxication.  On exam today the patient is disheveled and can't remember why he is here.  States that he doesn't know how he got here, why he is here or where he is.  He denies SI/HI and hallucinations. He is concerned about his belongings that were taken and how he will get home if discharged.  Denies withdrawal symptoms, declined rehab services, psych cleared.  Past Psychiatric History: alcohol use disorder  Risk to Self:  none Risk to Others:  none Prior Inpatient Therapy:  multiple rehabs Prior Outpatient Therapy:  [facility]  Past Medical History:  Past Medical History:  Diagnosis Date   Alcohol intoxication ([place])    Anticoagulant long-term use    lifetime use, coumadin therapy, then Xarelto   Anxiety    Arthritis    Cancer ([place])    renal   Carotid atherosclerosis    <50% left and right   Clotting disorder (HCC)    COPD (chronic obstructive pulmonary disease) ([place])    Depression    Elevated liver enzymes 11/2013   Family history of cancer    Fatty liver    H/O drug abuse ([place])    H/O ETOH abuse    Hepatitis    History of traumatic brain injury    Hypertension    Lymphadenopathy    Obesity    Personal history of DVT (deep vein thrombosis) 06/2013   Pre-diabetes    Primary osteoarthritis of left knee    Pulmonary embolism and infarction ([place]) 06/2013   Pulmonary embolism and infarction ([place]) 11/2013   Pulmonary embolus with infarction [facility])    Renal cell carcinoma ([place])     Renal mass, left 06/2013   with ureteral obstruction   Stroke ([place])    Subarachnoid hemorrhage following injury ([place])    Thrombocythemia    Thrombocytopenia ([place])    Tobacco abuse     Past Surgical History:  Procedure Laterality Date   ANKLE FRACTURE SURGERY Right 12/2012   ANKLE SURGERY     EXCISION METACARPAL MASS Right 09/18/2016   Procedure: EXCISION METACARPAL MASS;  Surgeon: Hessie Knows, MD;  Location: [facility] ORS;  Service: Orthopedics;  Laterality: Right;  5th digit    HERNIA REPAIR     Umbilical Hernia   ORBITAL FRACTURE SURGERY Left    ROBOTIC ASSITED PARTIAL NEPHRECTOMY Left Oct 2014   Family History:  Family History  Problem Relation Age of Onset   Pancreatic cancer Mother    Colon cancer Father    Diabetes Father    Colon cancer Sister    COPD Sister    Seizures Sister    Ovarian cancer Sister    Cancer Paternal Grandmother        bone   Stroke Paternal Grandfather    Heart disease Paternal Grandfather    Hypertension Neg Hx    Family Psychiatric  History: none Social History:  Social History   Substance and Sexual Activity  Alcohol Use Yes   Comment: occasionally  Social History   Substance and Sexual Activity  Drug Use Yes   Types: Marijuana   Comment: occ    Social History   Socioeconomic History   Marital status: Divorced    Spouse name: Not on file   Number of children: Not on file   Years of education: Not on file   Highest education level: Not on file  Occupational History   Not on file  Tobacco Use   Smoking status: Every Day    Packs/day: 0.50    Types: Cigarettes   Smokeless tobacco: Never  Vaping Use   Vaping Use: Never used  Substance and Sexual Activity   Alcohol use: Yes    Comment: occasionally   Drug use: Yes    Types: Marijuana    Comment: occ   Sexual activity: Yes  Other Topics Concern   Not on file  Social History Narrative   ** Merged History Encounter **       Social Determinants of Health    Financial Resource Strain: Not on file  Food Insecurity: No Food Insecurity (09/25/2021)   Hunger Vital Sign    Worried About Running Out of Food in the Last Year: Never true    Ran Out of Food in the Last Year: Never true  Transportation Needs: No Transportation Needs (09/25/2021)   PRAPARE - Hydrologist (Medical): No    Lack of Transportation (Non-Medical): No  Physical Activity: Not on file  Stress: Not on file  Social Connections: Not on file   Additional Social History:    Allergies:  No Known Allergies  Labs:  Results for orders placed or performed during the hospital encounter of 04/05/22 (from the past 48 hour(s))  Comprehensive metabolic panel     Status: Abnormal   Collection Time: 04/05/22  6:26 PM  Result Value Ref Range   Sodium 135 135 - 145 mmol/L   Potassium 3.9 3.5 - 5.1 mmol/L   Chloride 108 98 - 111 mmol/L   CO2 18 (L) 22 - 32 mmol/L   Glucose, Bld 112 (H) 70 - 99 mg/dL    Comment: Glucose reference range applies only to samples taken after fasting for at least 8 hours.   BUN 23 8 - 23 mg/dL   Creatinine, Ser 0.92 0.61 - 1.24 mg/dL   Calcium 9.0 8.9 - 10.3 mg/dL   Total Protein 7.9 6.5 - 8.1 g/dL   Albumin 4.4 3.5 - 5.0 g/dL   AST 43 (H) 15 - 41 U/L   ALT 42 0 - 44 U/L   Alkaline Phosphatase 61 38 - 126 U/L   Total Bilirubin 1.0 0.3 - 1.2 mg/dL   GFR, Estimated >60 >60 mL/min    Comment: (NOTE) Calculated using the CKD-EPI Creatinine Equation (2021)    Anion gap 9 5 - 15    Comment: Performed at [facility], [address] [place], [place] [address]  Ethanol     Status: Abnormal   Collection Time: 04/05/22  6:26 PM  Result Value Ref Range   Alcohol, Ethyl (B) 287 (H) <10 mg/dL    Comment: (NOTE) Lowest detectable limit for serum alcohol is 10 mg/dL.  For medical purposes only. Performed at [facility], [address] [place], [place] [address]   cbc     Status: Abnormal   Collection  Time: 04/05/22  6:26 PM  Result Value Ref Range   WBC 12.1 (H) 4.0 - 10.5 K/uL  RBC 4.95 4.22 - 5.81 MIL/uL   Hemoglobin 16.3 13.0 - 17.0 g/dL   HCT 49.1 39.0 - 52.0 %   MCV 99.2 80.0 - 100.0 fL   MCH 32.9 26.0 - 34.0 pg   MCHC 33.2 30.0 - 36.0 g/dL   RDW 11.9 11.5 - 15.5 %   Platelets 189 150 - 400 K/uL   nRBC 0.0 0.0 - 0.2 %    Comment: Performed at [facility], [address], [place], [place] [address]  Resp Panel by RT-PCR (Flu A&B, Covid) Anterior Nasal Swab     Status: None   Collection Time: 04/05/22  8:31 PM   Specimen: Anterior Nasal Swab  Result Value Ref Range   SARS Coronavirus 2 by RT PCR NEGATIVE NEGATIVE    Comment: (NOTE) SARS-CoV-2 target nucleic acids are NOT DETECTED.  The SARS-CoV-2 RNA is generally detectable in upper respiratory specimens during the acute phase of infection. The lowest concentration of SARS-CoV-2 viral copies this assay can detect is 138 copies/mL. A negative result does not preclude SARS-Cov-2 infection and should not be used as the sole basis for treatment or other patient management decisions. A negative result may occur with  improper specimen collection/handling, submission of specimen other than nasopharyngeal swab, presence of viral mutation(s) within the areas targeted by this assay, and inadequate number of viral copies(<138 copies/mL). A negative result must be combined with clinical observations, patient history, and epidemiological information. The expected result is Negative.  Fact Sheet for Patients:  EntrepreneurPulse.com.au  Fact Sheet for Healthcare Providers:  IncredibleEmployment.be  This test is no t yet approved or cleared by the [place] FDA and  has been authorized for detection and/or diagnosis of SARS-CoV-2 by FDA under an Emergency Use Authorization (EUA). This EUA will remain  in effect (meaning this test can be used) for the duration of the COVID-19  declaration under Section 564(b)(1) of the Act, 21 U.S.C.section 360bbb-3(b)(1), unless the authorization is terminated  or revoked sooner.       Influenza A by PCR NEGATIVE NEGATIVE   Influenza B by PCR NEGATIVE NEGATIVE    Comment: (NOTE) The Xpert Xpress SARS-CoV-2/FLU/RSV plus assay is intended as an aid in the diagnosis of influenza from Nasopharyngeal swab specimens and should not be used as a sole basis for treatment. Nasal washings and aspirates are unacceptable for Xpert Xpress SARS-CoV-2/FLU/RSV testing.  Fact Sheet for Patients: EntrepreneurPulse.com.au  Fact Sheet for Healthcare Providers: IncredibleEmployment.be  This test is not yet approved or cleared by the [place] FDA and has been authorized for detection and/or diagnosis of SARS-CoV-2 by FDA under an Emergency Use Authorization (EUA). This EUA will remain in effect (meaning this test can be used) for the duration of the COVID-19 declaration under Section 564(b)(1) of the Act, 21 U.S.C. section 360bbb-3(b)(1), unless the authorization is terminated or revoked.  Performed at [facility], [address] [place],  [address]     No current facility-administered medications for this encounter.   Current Outpatient Medications  Medication Sig Dispense Refill   acetaminophen (TYLENOL) 500 MG tablet Take 2 tablets (1,000 mg total) by mouth every 8 (eight) hours as needed. (Patient not taking: Reported on 02/21/2022) 30 tablet 0   albuterol (VENTOLIN HFA) 108 (90 Base) MCG/ACT inhaler Inhale 2 puffs into the lungs every 6 (six) hours as needed for wheezing or shortness of breath. (Patient not taking: Reported on 02/21/2022) 8 g 0   butalbital-acetaminophen-caffeine (FIORICET) 50-325-40 MG tablet Take 1-2 tablets  by mouth every 6 (six) hours as needed for headache. (Patient not taking: Reported on 02/21/2022) 20 tablet 0   docusate sodium (COLACE) 100 MG capsule  Take 1 tablet once or twice daily as needed for constipation while taking narcotic pain medicine (Patient not taking: Reported on 02/21/2022) 30 capsule 0   fluticasone (FLONASE) 50 MCG/ACT nasal spray Place 2 sprays into both nostrils daily. (Patient not taking: Reported on 1/88/4166) 16 g 6   folic acid (FOLVITE) 1 MG tablet Take 1 tablet (1 mg total) by mouth daily. (Patient not taking: Reported on 02/21/2022) 30 tablet 0   Multiple Vitamin (MULTIVITAMIN WITH MINERALS) TABS tablet Take 1 tablet by mouth daily. (Patient not taking: Reported on 02/21/2022) 30 tablet 0   polyethylene glycol (MIRALAX / GLYCOLAX) 17 g packet Take 17 g by mouth daily. (Patient not taking: Reported on 02/21/2022) 14 each 0   sertraline (ZOLOFT) 25 MG tablet Take 1 tablet (25 mg total) by mouth daily. (Patient not taking: Reported on 02/04/2022) 30 tablet 3   thiamine 100 MG tablet Take 1 tablet (100 mg total) by mouth daily. (Patient not taking: Reported on 02/21/2022) 30 tablet 0    Musculoskeletal: Strength & Muscle Tone: within normal limits Gait & Station: normal Patient leans: N/A  Psychiatric Specialty Exam: Physical Exam Vitals and nursing note reviewed.  Constitutional:      Appearance: Normal appearance.  HENT:     Head: Normocephalic.     Nose: Nose normal.  Pulmonary:     Effort: Pulmonary effort is normal.  Musculoskeletal:        General: Normal range of motion.     Cervical back: Normal range of motion.  Neurological:     General: No focal deficit present.     Mental Status: He is alert and oriented to person, place, and time.  Psychiatric:        Attention and Perception: Attention and perception normal.        Mood and Affect: Mood is anxious.        Speech: Speech normal.        Behavior: Behavior normal. Behavior is cooperative.        Thought Content: Thought content normal.        Cognition and Memory: Cognition and memory normal.        Judgment: Judgment normal.     Review of  Systems  Psychiatric/Behavioral:  Positive for substance abuse. The patient is nervous/anxious.     Blood pressure (!) 142/92, pulse (!) 101, temperature 98.2 F (36.8 C), temperature source Oral, resp. rate 18, height '5\' 6"'$  (1.676 m), weight 67.1 kg, SpO2 95 %.Body mass index is 23.88 kg/m.  General Appearance: Disheveled  Eye Contact:  Good  Speech:  Clear and Coherent  Volume:  Normal  Mood:  Anxious  Affect:  Congruent  Thought Process:  Coherent  Orientation:  Alert and oriented  Thought Content:  Logical  Suicidal Thoughts:  No  Homicidal Thoughts:  No  Memory:  Immediate;   Good Recent;   Fair  Judgement:  Fair  Insight:  Lacking  Psychomotor Activity:  Normal  Concentration:  Concentration: Fair and Attention Span: Fair  Recall:  [employer] of Knowledge:  Fair  Language:  Good  Akathisia:  No  Handed:  Right  AIMS (if indicated):     Assets:  Leisure Time Physical Health Resilience  ADL's:  Intact  Cognition:  WNL  Sleep:  Physical Exam: Physical Exam Vitals and nursing note reviewed.  Constitutional:      Appearance: Normal appearance.  HENT:     Head: Normocephalic.     Nose: Nose normal.  Pulmonary:     Effort: Pulmonary effort is normal.  Musculoskeletal:        General: Normal range of motion.     Cervical back: Normal range of motion.  Neurological:     General: No focal deficit present.     Mental Status: He is alert and oriented to person, place, and time.  Psychiatric:        Attention and Perception: Attention and perception normal.        Mood and Affect: Mood is anxious.        Speech: Speech normal.        Behavior: Behavior normal. Behavior is cooperative.        Thought Content: Thought content normal.        Cognition and Memory: Cognition and memory normal.        Judgment: Judgment normal.    Review of Systems  Psychiatric/Behavioral:  Positive for substance abuse. The patient is nervous/anxious.    Blood pressure (!)  142/92, pulse (!) 101, temperature 98.2 F (36.8 C), temperature source Oral, resp. rate 18, height '5\' 6"'$  (1.676 m), weight 67.1 kg, SpO2 95 %. Body mass index is 23.88 kg/m.  Treatment Plan Summary: Alcohol intoxication without complications: Recommended rehab, patient declined Follow up with [facility]  Disposition: No evidence of imminent risk to self or others at present.   Patient does not meet criteria for psychiatric inpatient admission.  Waylan Boga, NP 04/06/2022 10:54 AM

## 2022-04-06 NOTE — ED Notes (Signed)
Pt discharged to lobby.  VS stable. 2 belonging bags returned to patient.  Pt denies SI/HI.

## 2022-04-06 NOTE — ED Notes (Signed)
Pt given breakfast tray and gingerale

## 2022-04-06 NOTE — BH Assessment (Signed)
Comprehensive Clinical Assessment (CCA) Screening, Triage and Referral Note  04/06/2022 Randall Harvey [id number]  Randall Harvey., 61 year old male who presents to [facility] ED involuntarily for treatment. Per triage note, Pt dropped at front door by [employer]. Pt states "I want to kill niggers". Pt continuously using racial slurs in the [place]. Pt obviously intoxicated and has bottles of alcohol in his bag. Pt also with groceries.     During TTS assessment pt presents alert and oriented x 4, restless but cooperative, and mood-congruent with affect. The pt does not appear to be responding to internal or external stimuli. Neither is the pt presenting with any delusional thinking. Pt verified the information provided to triage RN.   Pt identifies his main complaint to be that he is not sure why he was brought to the ED. Patient reports he does not remember what happened or how he got there. Patient is concerned about his belongings and how he will get home. Pt denies current SI/HI/AH/VH. Pt contracts for safety. Patient declined rehab services.    Per Randall Reef, NP, pt shows no evidence of imminent risk to self or others at present and does not meet criteria for psychiatric inpatient admission.   Chief Complaint:  Chief Complaint  Patient presents with   Homicidal   Visit Diagnosis: Alcohol intoxication  Patient Reported Information How did you hear about [place]? -- Risk manager)  What Is the Reason for Your Visit/Call Today? Patient reports he is not sure why he was brought to the ED.  How Long Has This Been Causing You Problems? <Week  What Do You Feel Would Help You the Most Today? -- (Assessment only)   Have You Recently Had Any Thoughts About Hurting Yourself? No  Are You Planning to Commit Suicide/Harm Yourself At This time? No   Have you Recently Had Thoughts About [place]? No  Are You Planning to Harm Someone at This Time? No  Explanation: No data recorded  Have You Used Any  Alcohol or Drugs in the Past 24 Hours? Yes  How Long Ago Did You Use Drugs or Alcohol? No data recorded What Did You Use and How Much? Alcohol   Do You Currently Have a Therapist/Psychiatrist? No  Name of Therapist/Psychiatrist: No data recorded  Have You Been Recently Discharged From Any Office Practice or Programs? No  Explanation of Discharge From Practice/Program: No data recorded   CCA Screening Triage Referral Assessment Type of Contact: Face-to-Face  Telemedicine Service Delivery:   Is this Initial or Reassessment? No data recorded Date Telepsych consult ordered in [facility]:  No data recorded Time Telepsych consult ordered in [facility]:  No data recorded Location of Assessment: [facility] ED  Provider Location: [facility] ED   Collateral Involvement: None provided   Does Patient Have a [place]? No data recorded Name and Contact of Legal Guardian: No data recorded If Minor and Not Living with Parent(s), Who has Custody? n/a  Is CPS involved or ever been involved? Never  Is APS involved or ever been involved? Never   Patient Determined To Be At Risk for Harm To Self or Others Based on Review of Patient Reported Information or Presenting Complaint? No  Method: No data recorded Availability of Means: No data recorded Intent: No data recorded Notification Required: No data recorded Additional Information for Danger to Others Potential: No data recorded Additional Comments for Danger to Others Potential: No data recorded Are There Guns or Other Weapons in Your Home?  No data recorded Types of Guns/Weapons: No data recorded Are These Weapons Safely Secured?                            No data recorded Who Could Verify You Are Able To Have These Secured: No data recorded Do You Have any Outstanding Charges, Pending Court Dates, Parole/Probation? No data recorded Contacted To Inform of Risk of Harm To Self or Others: No data recorded  Does Patient Present under  Involuntary Commitment? No  IVC Papers Initial File Date: No data recorded  [place] of Residence: [place]   Patient Currently Receiving the Following Services: Not Receiving Services   Determination of Need: Emergent (2 hours)   Options For Referral: ED Visit; Therapeutic Triage Services   Discharge Disposition:     Eula Fried, Counselor, LCAS-A

## 2022-04-06 NOTE — ED Notes (Signed)
Pt dressing for discharge.  Pt told by RN we could not provide transportation for him.

## 2022-04-06 NOTE — ED Provider Notes (Signed)
Emergency department handoff note  Care of this patient was signed out to me at the end of the previous provider shift.  All pertinent patient information was conveyed and all questions were answered.  Patient pending psychiatric evaluation.  Patient was seen by our psychiatric care team and did not show any signs or symptoms necessitating inpatient psychiatric care.  Patient is stable for outpatient psychiatric/substance abuse resources at this time.  Dispo: Discharge with resources for outpatient rehabilitation   Naaman Plummer, MD 04/06/22 [phone]

## 2022-04-06 NOTE — ED Notes (Signed)
Pt sleeping, breakfast tray held with nurse

## 2022-04-06 NOTE — ED Notes (Signed)
Unable to assess, Pt BAL [phone] at the time of consult request.

## 2022-06-11 ENCOUNTER — Emergency Department: Payer: Medicare Other

## 2022-06-11 ENCOUNTER — Emergency Department
Admission: EM | Admit: 2022-06-11 | Discharge: 2022-06-12 | Disposition: A | Discharge status: Home / Self Care | Payer: Medicare Other | Attending: Emergency Medicine | Admitting: Emergency Medicine

## 2022-06-11 ENCOUNTER — Other Ambulatory Visit: Payer: Self-pay

## 2022-06-11 DIAGNOSIS — F10121 Alcohol abuse with intoxication delirium: Secondary | ICD-10-CM | POA: Insufficient documentation

## 2022-06-11 DIAGNOSIS — S065X0A Traumatic subdural hemorrhage without loss of consciousness, initial encounter: Secondary | ICD-10-CM | POA: Diagnosis not present

## 2022-06-11 DIAGNOSIS — M4312 Spondylolisthesis, cervical region: Secondary | ICD-10-CM | POA: Diagnosis not present

## 2022-06-11 DIAGNOSIS — E119 Type 2 diabetes mellitus without complications: Secondary | ICD-10-CM | POA: Insufficient documentation

## 2022-06-11 DIAGNOSIS — R61 Generalized hyperhidrosis: Secondary | ICD-10-CM | POA: Diagnosis not present

## 2022-06-11 DIAGNOSIS — Y908 Blood alcohol level of 240 mg/100 ml or more: Secondary | ICD-10-CM | POA: Insufficient documentation

## 2022-06-11 DIAGNOSIS — Z743 Need for continuous supervision: Secondary | ICD-10-CM | POA: Diagnosis not present

## 2022-06-11 DIAGNOSIS — G319 Degenerative disease of nervous system, unspecified: Secondary | ICD-10-CM | POA: Diagnosis not present

## 2022-06-11 DIAGNOSIS — R404 Transient alteration of awareness: Secondary | ICD-10-CM | POA: Diagnosis not present

## 2022-06-11 DIAGNOSIS — M50322 Other cervical disc degeneration at C5-C6 level: Secondary | ICD-10-CM | POA: Diagnosis not present

## 2022-06-11 DIAGNOSIS — S199XXA Unspecified injury of neck, initial encounter: Secondary | ICD-10-CM | POA: Diagnosis not present

## 2022-06-11 DIAGNOSIS — M4602 Spinal enthesopathy, cervical region: Secondary | ICD-10-CM | POA: Diagnosis not present

## 2022-06-11 DIAGNOSIS — F10921 Alcohol use, unspecified with intoxication delirium: Secondary | ICD-10-CM

## 2022-06-11 DIAGNOSIS — R000 Tachycardia, unspecified: Secondary | ICD-10-CM | POA: Diagnosis not present

## 2022-06-11 LAB — CBC WITH DIFFERENTIAL/PLATELET
Abs Immature Granulocytes: 0.04 10*3/uL (ref 0.00–0.07)
Basophils Absolute: 0.1 10*3/uL (ref 0.0–0.1)
Basophils Relative: 1 %
Eosinophils Absolute: 0.1 10*3/uL (ref 0.0–0.5)
Eosinophils Relative: 1 %
HCT: 46.1 % (ref 39.0–52.0)
Hemoglobin: 15.2 g/dL (ref 13.0–17.0)
Immature Granulocytes: 0 %
Lymphocytes Relative: 45 %
Lymphs Abs: 5.6 10*3/uL — ABNORMAL HIGH (ref 0.7–4.0)
MCH: 32.1 pg (ref 26.0–34.0)
MCHC: 33 g/dL (ref 30.0–36.0)
MCV: 97.5 fL (ref 80.0–100.0)
Monocytes Absolute: 0.7 10*3/uL (ref 0.1–1.0)
Monocytes Relative: 5 %
Neutro Abs: 6 10*3/uL (ref 1.7–7.7)
Neutrophils Relative %: 48 %
Platelets: 154 10*3/uL (ref 150–400)
RBC: 4.73 MIL/uL (ref 4.22–5.81)
RDW: 11.9 % (ref 11.5–15.5)
WBC: 12.6 10*3/uL — ABNORMAL HIGH (ref 4.0–10.5)
nRBC: 0 % (ref 0.0–0.2)

## 2022-06-11 LAB — COMPREHENSIVE METABOLIC PANEL
ALT: 25 U/L (ref 0–44)
AST: 27 U/L (ref 15–41)
Albumin: 4 g/dL (ref 3.5–5.0)
Alkaline Phosphatase: 64 U/L (ref 38–126)
Anion gap: 11 (ref 5–15)
BUN: 16 mg/dL (ref 8–23)
CO2: 22 mmol/L (ref 22–32)
Calcium: 8.7 mg/dL — ABNORMAL LOW (ref 8.9–10.3)
Chloride: 108 mmol/L (ref 98–111)
Creatinine, Ser: 0.88 mg/dL (ref 0.61–1.24)
GFR, Estimated: >60 mL/min (ref >60)
Glucose, Bld: 133 mg/dL — ABNORMAL HIGH (ref 70–99)
Potassium: 3.7 mmol/L (ref 3.5–5.1)
Sodium: 141 mmol/L (ref 135–145)
Total Bilirubin: 1.1 mg/dL (ref 0.3–1.2)
Total Protein: 7.7 g/dL (ref 6.5–8.1)

## 2022-06-11 LAB — URINALYSIS, ROUTINE W REFLEX MICROSCOPIC
Bilirubin Urine: NEGATIVE
Glucose, UA: NEGATIVE mg/dL
Hgb urine dipstick: NEGATIVE
Ketones, ur: NEGATIVE mg/dL
Leukocytes,Ua: NEGATIVE
Nitrite: NEGATIVE
Protein, ur: NEGATIVE mg/dL
Specific Gravity, Urine: 1.013 (ref 1.005–1.030)
pH: 5 (ref 5.0–8.0)

## 2022-06-11 LAB — ETHANOL
Alcohol, Ethyl (B): 292 mg/dL — ABNORMAL HIGH (ref <10)
Alcohol, Ethyl (B): 200 mg/dL — ABNORMAL HIGH (ref <10)

## 2022-06-11 LAB — MAGNESIUM: Magnesium: 2 mg/dL (ref 1.7–2.4)

## 2022-06-11 MED ORDER — THIAMINE MONONITRATE 100 MG PO TABS
100.0000 mg | ORAL_TABLET | Freq: Every day | ORAL | Status: DC
  Filled 2022-06-11: qty 1

## 2022-06-11 MED ORDER — ADULT MULTIVITAMIN W/MINERALS CH
1.0000 | ORAL_TABLET | Freq: Every day | ORAL | Status: DC
  Filled 2022-06-11: qty 1

## 2022-06-11 MED ORDER — THIAMINE HCL 100 MG/ML IJ SOLN
100.0000 mg | Freq: Every day | INTRAMUSCULAR | Status: DC
  Administered 2022-06-11: 100 mg via INTRAVENOUS
  Filled 2022-06-11: qty 2

## 2022-06-11 MED ORDER — HALOPERIDOL LACTATE 5 MG/ML IJ SOLN
5.0000 mg | Freq: Once | INTRAMUSCULAR | Status: AC
  Administered 2022-06-11: 5 mg via INTRAMUSCULAR
  Filled 2022-06-11: qty 1

## 2022-06-11 MED ORDER — DIPHENHYDRAMINE HCL 50 MG/ML IJ SOLN
50.0000 mg | Freq: Once | INTRAMUSCULAR | Status: AC
  Administered 2022-06-11: 50 mg via INTRAMUSCULAR
  Filled 2022-06-11: qty 1

## 2022-06-11 MED ORDER — SODIUM CHLORIDE 0.9 % IV BOLUS
1000.0000 mL | Freq: Once | INTRAVENOUS | Status: AC
  Administered 2022-06-11: 1000 mL via INTRAVENOUS

## 2022-06-11 MED ORDER — LORAZEPAM 1 MG PO TABS: 1.0000 mg | ORAL_TABLET | ORAL | Status: DC | PRN

## 2022-06-11 MED ORDER — FOLIC ACID 1 MG PO TABS
1.0000 mg | ORAL_TABLET | Freq: Every day | ORAL | Status: DC
  Filled 2022-06-11: qty 1

## 2022-06-11 MED ORDER — LORAZEPAM 2 MG/ML IJ SOLN: 1.0000 mg | INTRAMUSCULAR | Status: DC | PRN

## 2022-06-11 NOTE — ED Notes (Signed)
ED Provider at bedside. 

## 2022-06-11 NOTE — ED Triage Notes (Signed)
[facility] EMS [facility] asking [facility]. Pt has been aggressive towards EMS. Possible ETOH. Uncooperative with healthcare. Pt threatening healthcare staff but redirectable.

## 2022-06-11 NOTE — ED Notes (Signed)
Pt is calmer at this time, laying in bed and following commands.

## 2022-06-11 NOTE — ED Provider Notes (Signed)
[facility] Provider Note    Event Date/Time   First MD Initiated Contact with Patient 06/11/22 1447     (approximate)   History   Alcohol Intoxication   HPI  Randall Harvey. is a 61 y.o. male with history of alcohol intoxication, TBI, COPD, diabetes, PE and as listed in EMR presents to the emergency department for evaluation after he went into a chart and was asking for help.  He arrived via EMS he reports that he was uncooperative and aggressive.     Physical Exam   Triage Vital Signs: ED Triage Vitals [06/11/22 1429]  Enc Vitals Group     BP (!) 140/82     Pulse Rate (!) 109     Resp (!) 27     Temp      Temp src      SpO2 94 %     Weight      Height      Head Circumference      Peak Flow      Pain Score      Pain Loc      Pain Edu?      Excl. in [place]?     Most recent vital signs: Vitals:   06/11/22 1800 06/11/22 1900  BP: 101/70 120/84  Pulse: 72 77  Resp: 20 16  Temp:    SpO2: 100% 96%    General: Awake, no distress.  CV:  Good peripheral perfusion.  Resp:  Normal effort.  Abd:  No distention.  Other:  Emotionally labile--laughing, crying, angry   ED Results / Procedures / Treatments   Labs (all labs ordered are listed, but only abnormal results are displayed) Labs Reviewed  CBC WITH DIFFERENTIAL/PLATELET - Abnormal; Notable for the following components:      Result Value   WBC 12.6 (*)    Lymphs Abs 5.6 (*)    All other components within normal limits  COMPREHENSIVE METABOLIC PANEL - Abnormal; Notable for the following components:   Glucose, Bld 133 (*)    Calcium 8.7 (*)    All other components within normal limits  ETHANOL - Abnormal; Notable for the following components:   Alcohol, Ethyl (B) 292 (*)    All other components within normal limits  ETHANOL - Abnormal; Notable for the following components:   Alcohol, Ethyl (B) 200 (*)    All other components within normal limits  MAGNESIUM  URINALYSIS, ROUTINE W  REFLEX MICROSCOPIC  URINE DRUG SCREEN, QUALITATIVE ([facility] ONLY)     EKG  Sinus tachycardia, rate of 105.   RADIOLOGY  Image and radiology report reviewed by me.  CT of his head shows no acute intracranial abnormalities.  There is interval improvement in the chronic subdural hematomas in the frontal region bilaterally.  CT cervical spine negative for acute concerns.  PROCEDURES:  Critical Care performed: Yes, see critical care procedure note(s)  Procedures   MEDICATIONS ORDERED IN ED: Medications  LORazepam (ATIVAN) tablet 1-4 mg (has no administration in time range)    Or  LORazepam (ATIVAN) injection 1-4 mg (has no administration in time range)  thiamine (VITAMIN B1) tablet 100 mg ( Oral See Alternative 06/11/22 1753)    Or  thiamine (VITAMIN B1) injection 100 mg (100 mg Intravenous Given 05/09/90 4782)  folic acid (FOLVITE) tablet 1 mg (0 mg Oral Hold 06/11/22 1758)  multivitamin with minerals tablet 1 tablet (0 tablets Oral Hold 06/11/22 1759)  haloperidol lactate (HALDOL) injection 5  mg (5 mg Intramuscular Given 06/11/22 1449)  diphenhydrAMINE (BENADRYL) injection 50 mg (50 mg Intramuscular Given 06/11/22 1506)  sodium chloride 0.9 % bolus 1,000 mL (0 mLs Intravenous Stopped 06/11/22 1903)  sodium chloride 0.9 % bolus 1,000 mL (0 mLs Intravenous Stopped 06/11/22 2018)     IMPRESSION / MDM / ASSESSMENT AND PLAN / ED COURSE   I have reviewed the triage note.  Differential diagnosis includes, but is not limited to, alcohol intoxication, substance abuse, psychosis, encephalopathy.  The patient is on the cardiac monitor to evaluate for evidence of arrhythmia and/or significant heart rate changes.  61 year old male presenting to the emergency department for evaluation after entering a church and requesting assistance.  EMS was called to the scene patient was found to be intoxicated and agitated, mad one second and crying the next.  He has a frequent past medical history of the  same.  Upon arrival, the patient was somewhat combative and verbally abusive to staff.  Unable to provide reassurance and get him calm.  He had attempted to get up out of the bed multiple times without assistance and he has also ripped out his IV.  Haldol IM given for patient's safety.  Haldol provided some improvement of agitation, however he is still attempting to get out of bed without assistance and is yelling.  IM Benadryl ordered.  Patient responded well after receiving the IM Benadryl and is now calm.  He is sleepy but responds to voice and is able to follow commands.  Vital signs are stable.  CT head and cervical spine ordered in addition to labs.  Initial alcohol level was 292.  It has since decreased to 200.  Second liter of IV fluid ordered.  Patient continues to rest with stable vital signs.  Awaiting urinalysis and urine drug screen.  Care transitioned to Dr. Jacelyn Grip who will follow up on outstanding results and reassess to determine disposition.      FINAL CLINICAL IMPRESSION(S) / ED DIAGNOSES   Final diagnoses:  Alcohol intoxication with delirium ([place])     Rx / DC Orders   ED Discharge Orders     None        Note:  This document was prepared using Dragon voice recognition software and may include unintentional dictation errors.   Victorino Dike, FNP 06/11/22 2030    Duffy Bruce, MD 06/12/22 [phone]

## 2022-06-11 NOTE — ED Notes (Signed)
Pt continues to get out of bed. Pt refuses to wear monitor. MD and NP notified.

## 2022-06-12 LAB — URINE DRUG SCREEN, QUALITATIVE (ARMC ONLY)
Amphetamines, Ur Screen: NOT DETECTED
Barbiturates, Ur Screen: NOT DETECTED
Benzodiazepine, Ur Scrn: NOT DETECTED
Cannabinoid 50 Ng, Ur ~~LOC~~: NOT DETECTED
Cocaine Metabolite,Ur ~~LOC~~: POSITIVE — AB
MDMA (Ecstasy)Ur Screen: NOT DETECTED
Methadone Scn, Ur: NOT DETECTED
Opiate, Ur Screen: NOT DETECTED
Phencyclidine (PCP) Ur S: NOT DETECTED
Tricyclic, Ur Screen: NOT DETECTED

## 2022-06-12 NOTE — Discharge Instructions (Signed)
   Thank you for choosing us for your health care today!  Please see your primary doctor this week for a follow up appointment.   If you do not have a primary doctor call the following clinics to establish care:  If you have insurance:  [facility] [phone] [address] [place] [address]   [facility]  [phone] [address] [place] [address]   If you do not have insurance:  [facility]  [phone] [address] [place] [address]  Sometimes, in the early stages of certain disease courses it is difficult to detect in the emergency department evaluation -- so, it is important that you continue to monitor your symptoms and call your doctor right away or return to the emergency department if you develop any new or worsening symptoms.  It was my pleasure to care for you today.    S. , MD  

## 2022-06-12 NOTE — ED Notes (Signed)
Signing pad did not work. Pt verbalized understanding of DC instructions. 

## 2022-06-12 NOTE — ED Notes (Signed)
PT ambulatory and AAOx4 with steady gait.Pt given a meal tray and dc'd to the waiting room.

## 2022-06-12 NOTE — ED Provider Notes (Signed)
Patient is now sober on my clinical evaluation.  No other complaints at this time, asking to rest some more.  Does not report suicidal ideation.  Awake and eating in bed, moving all extremities, no obvious signs of trauma on my exam.  Hemodynamics reviewed and reassuring.  Safe for discharge home.  Lucillie Garfinkel MD   Lucillie Garfinkel, MD 06/12/22 [phone]

## 2022-07-02 NOTE — Progress Notes (Signed)
[facility] Quality Team Note  Name: Randall Harvey. Date of Birth: 1961/05/26 MRN: [id number] Date: 07/02/2022  [facility] Quality Team has reviewed this patient's chart, please see recommendations below:  [facility] Quality Other; (EED GAP- PATIENT DUE FOR DIABETIC RETINOPATHY SCREENING. SEVERAL UNSUCCESSFUL OUTREACH ATTEMPTS BY [facility] TO OFFER TO SCHEDULE PATIENT FOR 2023 EYE EVENTS. NEXT EYE EVENT IS 08/05/2022)

## 2022-12-13 DIAGNOSIS — R079 Chest pain, unspecified: Secondary | ICD-10-CM | POA: Diagnosis not present

## 2022-12-13 DIAGNOSIS — R0789 Other chest pain: Secondary | ICD-10-CM | POA: Diagnosis not present

## 2023-01-05 ENCOUNTER — Other Ambulatory Visit: Payer: Self-pay

## 2023-01-05 ENCOUNTER — Emergency Department
Admission: EM | Admit: 2023-01-05 | Discharge: 2023-01-06 | Disposition: A | Discharge status: Home / Self Care | Payer: 59 | Attending: Emergency Medicine | Admitting: Emergency Medicine

## 2023-01-05 DIAGNOSIS — F1094 Alcohol use, unspecified with alcohol-induced mood disorder: Secondary | ICD-10-CM | POA: Diagnosis not present

## 2023-01-05 DIAGNOSIS — J449 Chronic obstructive pulmonary disease, unspecified: Secondary | ICD-10-CM | POA: Diagnosis not present

## 2023-01-05 DIAGNOSIS — E119 Type 2 diabetes mellitus without complications: Secondary | ICD-10-CM | POA: Diagnosis not present

## 2023-01-05 DIAGNOSIS — F1024 Alcohol dependence with alcohol-induced mood disorder: Secondary | ICD-10-CM | POA: Insufficient documentation

## 2023-01-05 DIAGNOSIS — F10920 Alcohol use, unspecified with intoxication, uncomplicated: Secondary | ICD-10-CM

## 2023-01-05 DIAGNOSIS — Z743 Need for continuous supervision: Secondary | ICD-10-CM | POA: Diagnosis not present

## 2023-01-05 DIAGNOSIS — R45851 Suicidal ideations: Secondary | ICD-10-CM | POA: Diagnosis not present

## 2023-01-05 DIAGNOSIS — Y908 Blood alcohol level of 240 mg/100 ml or more: Secondary | ICD-10-CM | POA: Diagnosis not present

## 2023-01-05 LAB — CBC WITH DIFFERENTIAL/PLATELET
Abs Immature Granulocytes: 0.04 10*3/uL (ref 0.00–0.07)
Basophils Absolute: 0.1 10*3/uL (ref 0.0–0.1)
Basophils Relative: 1 %
Eosinophils Absolute: 0.1 10*3/uL (ref 0.0–0.5)
Eosinophils Relative: 1 %
HCT: 46.3 % (ref 39.0–52.0)
Hemoglobin: 15.1 g/dL (ref 13.0–17.0)
Immature Granulocytes: 0 %
Lymphocytes Relative: 56 %
Lymphs Abs: 6.9 10*3/uL — ABNORMAL HIGH (ref 0.7–4.0)
MCH: 32.3 pg (ref 26.0–34.0)
MCHC: 32.6 g/dL (ref 30.0–36.0)
MCV: 99.1 fL (ref 80.0–100.0)
Monocytes Absolute: 0.5 10*3/uL (ref 0.1–1.0)
Monocytes Relative: 4 %
Neutro Abs: 4.7 10*3/uL (ref 1.7–7.7)
Neutrophils Relative %: 38 %
Platelets: 219 10*3/uL (ref 150–400)
RBC: 4.67 MIL/uL (ref 4.22–5.81)
RDW: 11.6 % (ref 11.5–15.5)
WBC: 12.4 10*3/uL — ABNORMAL HIGH (ref 4.0–10.5)
nRBC: 0 % (ref 0.0–0.2)

## 2023-01-05 LAB — BASIC METABOLIC PANEL
Anion gap: 11 (ref 5–15)
BUN: 14 mg/dL (ref 8–23)
CO2: 22 mmol/L (ref 22–32)
Calcium: 8.5 mg/dL — ABNORMAL LOW (ref 8.9–10.3)
Chloride: 105 mmol/L (ref 98–111)
Creatinine, Ser: 0.97 mg/dL (ref 0.61–1.24)
GFR, Estimated: >60 mL/min (ref >60)
Glucose, Bld: 120 mg/dL — ABNORMAL HIGH (ref 70–99)
Potassium: 3.7 mmol/L (ref 3.5–5.1)
Sodium: 138 mmol/L (ref 135–145)

## 2023-01-05 LAB — ETHANOL: Alcohol, Ethyl (B): 284 mg/dL — ABNORMAL HIGH (ref <10)

## 2023-01-05 MED ORDER — HALOPERIDOL LACTATE 5 MG/ML IJ SOLN
INTRAMUSCULAR | Status: AC
  Administered 2023-01-05: 5 mg via INTRAMUSCULAR
  Filled 2023-01-05: qty 1

## 2023-01-05 MED ORDER — LORAZEPAM 2 MG/ML IJ SOLN
INTRAMUSCULAR | Status: AC
  Administered 2023-01-05: 2 mg via INTRAMUSCULAR
  Filled 2023-01-05: qty 1

## 2023-01-05 NOTE — ED Notes (Signed)
ivc by MD Siadecki/ psych consult ordered/pending.Marland KitchenMarland Kitchen

## 2023-01-05 NOTE — ED Notes (Signed)
Patient's clothing removed and placed in blue paper scrubs: Clothing collected with white underwear, beige shorts, black belt, brown wallet, empty fireball bottle, chapstick, one lighter, 28 dollars in cash, two shoes, two socks, one visor, one white tee shirt, one cameo jacket. Belongings placed in plastic patient belonging bag.

## 2023-01-05 NOTE — ED Provider Notes (Signed)
[facility] Provider Note    Event Date/Time   First MD Initiated Contact with Patient 01/05/23 2121     (approximate)   History   Alcohol Intoxication   HPI  Randall Harvey. is a 62 y.o. male with a history of alcohol abuse, TBI, COPD, diabetes, and PE who presents with alcohol intoxication and stating that he wants to die.  Per EMS the patient was found at the side of the road.  He states that he wants to "get help" because he wants to die.  He keeps asking [place] to kill him.  The patient endorses alcohol use but states "I'm not drunk, just a little buzzed."  He denies any drug.  He has no acute pain.  He states that he has not attempted to harm himself and states that he is "not brave enough."   I reviewed the past medical records.  The patient was most recently evaluated by psychiatry on 7/9 after presenting with intoxication.  He was recommended for rehab but declined.  Physical Exam   Triage Vital Signs: ED Triage Vitals  Enc Vitals Group     BP 01/05/23 2125 111/72     Pulse Rate 01/05/23 2125 82     Resp 01/05/23 2125 20     Temp 01/05/23 2125 98.3 F (36.8 C)     Temp Source 01/05/23 2125 Oral     SpO2 01/05/23 2125 97 %     Weight --      Height --      Head Circumference --      Peak Flow --      Pain Score 01/05/23 2119 8     Pain Loc --      Pain Edu? --      Excl. in [place]? --     Most recent vital signs: Vitals:   01/05/23 2125 01/05/23 2200  BP: 111/72 111/72  Pulse: 82 82  Resp: 20   Temp: 98.3 F (36.8 C)   SpO2: 97%     General: Alert, intoxicated appearing. CV:  Good peripheral perfusion.  Resp:  Normal effort.  Abd:  No distention.  Other:  Agitated, somewhat labile, uncooperative.   ED Results / Procedures / Treatments   Labs (all labs ordered are listed, but only abnormal results are displayed) Labs Reviewed  BASIC METABOLIC PANEL - Abnormal; Notable for the following components:      Result Value   Glucose,  Bld 120 (*)    Calcium 8.5 (*)    All other components within normal limits  CBC WITH DIFFERENTIAL/PLATELET - Abnormal; Notable for the following components:   WBC 12.4 (*)    Lymphs Abs 6.9 (*)    All other components within normal limits  ETHANOL - Abnormal; Notable for the following components:   Alcohol, Ethyl (B) 284 (*)    All other components within normal limits     EKG   RADIOLOGY   PROCEDURES:  Critical Care performed: No  Procedures   MEDICATIONS ORDERED IN ED: Medications  haloperidol lactate (HALDOL) 5 MG/ML injection (5 mg Intramuscular Given 01/05/23 2131)  LORazepam (ATIVAN) 2 MG/ML injection (2 mg Intramuscular Given 01/05/23 2131)     IMPRESSION / MDM / ASSESSMENT AND PLAN / ED COURSE  I reviewed the triage vital signs and the nursing notes.  62 year old male with PMH as noted above presents with alcohol intoxication and suicidal ideation.  The patient's vital signs are normal.  He  is intoxicated appearing.  There is no visible trauma.  He is agitated, making inappropriate comments to nurses, and is uncooperative.  He states that he wants to die and is asking [place] to kill him.  He pulled lines off of him and attempted to get out of the bed.  At this time he demonstrates acute danger to self.  Differential diagnosis includes, but is not limited to, alcohol use disorder, major depressive disorder, adjustment disorder, substance-induced mood disorder.  I have ordered basic labs, ethanol level, and psychiatry and TTS consults.  Due to his agitation and concern for possible self injury and acute danger to himself and staff, I ordered IM Haldol and Ativan for sedation.  I have placed the patient under involuntary commitment.  Patient's presentation is most consistent with acute presentation with potential threat to life or bodily function.  The patient is on the cardiac monitor to evaluate for evidence of arrhythmia and/or significant heart rate changes.  The patient  has been placed in psychiatric observation due to the need to provide a safe environment for the patient while obtaining psychiatric consultation and evaluation, as well as ongoing medical and medication management to treat the patient's condition.  The patient has been placed under full IVC at this time.   ----------------------------------------- 11:29 PM on 01/05/2023 -----------------------------------------  Ethanol level is 284.  CBC and BMP are unremarkable.  The patient is sleeping comfortably.  I have signed him out to the oncoming ED physician Dr. Larinda Buttery.   FINAL CLINICAL IMPRESSION(S) / ED DIAGNOSES   Final diagnoses:  Alcoholic intoxication without complication  Suicidal ideation     Rx / DC Orders   ED Discharge Orders     None        Note:  This document was prepared using Dragon voice recognition software and may include unintentional dictation errors.    Dionne Bucy, MD 01/05/23 2330

## 2023-01-05 NOTE — ED Triage Notes (Signed)
Arrived via EMS from side of the road. (+) ETOH. Continuously states, "I want to die!" Denies having plan, states he wants someone to kill him. Moves all extremities, speech slightly slurred, resp even, unlabored on RA. Tearful during triage. Patient yelling "I want to die tonight!" Yelling at staff.

## 2023-01-06 DIAGNOSIS — F1094 Alcohol use, unspecified with alcohol-induced mood disorder: Secondary | ICD-10-CM | POA: Diagnosis not present
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
## 2023-01-06 NOTE — BH Assessment (Signed)
Pt was unable to participate in the assessment at this time. Psych team to follow up.

## 2023-01-06 NOTE — ED Notes (Signed)
Breakfast tray given to pt 

## 2023-01-06 NOTE — ED Notes (Signed)
E-signature not working at this time. Pt verbalized understanding of D/C instructions, prescriptions and follow up care with no further questions at this time. Pt in NAD and ambulatory at time of D/C. Belongings bag 1/1 sent with patient at time of departure. Cab voucher provided for patient at this visit, as patient reports he has no ride home.
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
## 2023-01-06 NOTE — ED Provider Notes (Signed)
The patient has been evaluated at bedside by  psychiatry.  Patient is clinically stable.  Not felt to be a danger to self or others.  No SI or Hi.  No indication for inpatient psychiatric admission at this time.  Appropriate for continued outpatient therapy.    Willy Eddy, MD 01/06/23 1053

## 2023-01-06 NOTE — Progress Notes (Signed)
Psychiatry brief note: The psych team (NP/TTS) came to see the patient in the emergency room, but she was not arousable despite shaking him gently by the shoulder and saying his name loudly several times. The patient had an elevated alcohol level on presentation. The patient is calm and stable, and vitals-appropriate treatment is in place. At this time, the psych team cannot do the psychiatric assessment at this time will reassess once the patient is awake.

## 2023-01-13 ENCOUNTER — Other Ambulatory Visit: Payer: Self-pay

## 2023-01-13 ENCOUNTER — Emergency Department
Admission: EM | Admit: 2023-01-13 | Discharge: 2023-01-14 | Disposition: A | Discharge status: Home / Self Care | Payer: 59 | Attending: Emergency Medicine | Admitting: Emergency Medicine

## 2023-01-13 DIAGNOSIS — R45851 Suicidal ideations: Secondary | ICD-10-CM | POA: Insufficient documentation

## 2023-01-13 DIAGNOSIS — J449 Chronic obstructive pulmonary disease, unspecified: Secondary | ICD-10-CM | POA: Insufficient documentation

## 2023-01-13 DIAGNOSIS — F10129 Alcohol abuse with intoxication, unspecified: Secondary | ICD-10-CM | POA: Diagnosis not present

## 2023-01-13 DIAGNOSIS — Y908 Blood alcohol level of 240 mg/100 ml or more: Secondary | ICD-10-CM | POA: Insufficient documentation

## 2023-01-13 DIAGNOSIS — F10920 Alcohol use, unspecified with intoxication, uncomplicated: Secondary | ICD-10-CM

## 2023-01-13 DIAGNOSIS — E119 Type 2 diabetes mellitus without complications: Secondary | ICD-10-CM | POA: Insufficient documentation

## 2023-01-13 DIAGNOSIS — W19XXXA Unspecified fall, initial encounter: Secondary | ICD-10-CM | POA: Diagnosis not present

## 2023-01-13 DIAGNOSIS — Z87820 Personal history of traumatic brain injury: Secondary | ICD-10-CM | POA: Insufficient documentation

## 2023-01-13 DIAGNOSIS — Z743 Need for continuous supervision: Secondary | ICD-10-CM | POA: Diagnosis not present

## 2023-01-13 DIAGNOSIS — R451 Restlessness and agitation: Secondary | ICD-10-CM | POA: Diagnosis not present

## 2023-01-13 DIAGNOSIS — F10120 Alcohol abuse with intoxication, uncomplicated: Secondary | ICD-10-CM | POA: Insufficient documentation

## 2023-01-13 LAB — CBC WITH DIFFERENTIAL/PLATELET
Abs Immature Granulocytes: 0.03 10*3/uL (ref 0.00–0.07)
Basophils Absolute: 0.1 10*3/uL (ref 0.0–0.1)
Basophils Relative: 1 %
Eosinophils Absolute: 0.1 10*3/uL (ref 0.0–0.5)
Eosinophils Relative: 1 %
HCT: 47.4 % (ref 39.0–52.0)
Hemoglobin: 15.5 g/dL (ref 13.0–17.0)
Immature Granulocytes: 0 %
Lymphocytes Relative: 51 %
Lymphs Abs: 4.8 10*3/uL — ABNORMAL HIGH (ref 0.7–4.0)
MCH: 32.6 pg (ref 26.0–34.0)
MCHC: 32.7 g/dL (ref 30.0–36.0)
MCV: 99.6 fL (ref 80.0–100.0)
Monocytes Absolute: 0.5 10*3/uL (ref 0.1–1.0)
Monocytes Relative: 6 %
Neutro Abs: 3.7 10*3/uL (ref 1.7–7.7)
Neutrophils Relative %: 41 %
Platelets: 199 10*3/uL (ref 150–400)
RBC: 4.76 MIL/uL (ref 4.22–5.81)
RDW: 11.8 % (ref 11.5–15.5)
Smear Review: NORMAL
WBC: 9.2 10*3/uL (ref 4.0–10.5)
nRBC: 0 % (ref 0.0–0.2)

## 2023-01-13 LAB — BASIC METABOLIC PANEL
Anion gap: 10 (ref 5–15)
BUN: 12 mg/dL (ref 8–23)
CO2: 21 mmol/L — ABNORMAL LOW (ref 22–32)
Calcium: 8.8 mg/dL — ABNORMAL LOW (ref 8.9–10.3)
Chloride: 107 mmol/L (ref 98–111)
Creatinine, Ser: 1.16 mg/dL (ref 0.61–1.24)
GFR, Estimated: >60 mL/min (ref >60)
Glucose, Bld: 101 mg/dL — ABNORMAL HIGH (ref 70–99)
Potassium: 4.3 mmol/L (ref 3.5–5.1)
Sodium: 138 mmol/L (ref 135–145)

## 2023-01-13 LAB — ETHANOL: Alcohol, Ethyl (B): 250 mg/dL — ABNORMAL HIGH (ref <10)

## 2023-01-13 MED ORDER — LORAZEPAM 2 MG/ML IJ SOLN
2.0000 mg | Freq: Once | INTRAMUSCULAR | Status: AC | PRN
  Administered 2023-01-13: 2 mg via INTRAMUSCULAR
  Filled 2023-01-13: qty 1

## 2023-01-13 MED ORDER — HALOPERIDOL LACTATE 5 MG/ML IJ SOLN: 5.0000 mg | Freq: Once | INTRAMUSCULAR | Status: DC | PRN

## 2023-01-13 MED ORDER — LORAZEPAM 2 MG/ML IJ SOLN: 2.0000 mg | Freq: Once | INTRAMUSCULAR | Status: DC | PRN

## 2023-01-13 MED ORDER — HALOPERIDOL LACTATE 5 MG/ML IJ SOLN
5.0000 mg | Freq: Once | INTRAMUSCULAR | Status: AC | PRN
  Administered 2023-01-13: 5 mg via INTRAMUSCULAR
  Filled 2023-01-13: qty 1

## 2023-01-13 NOTE — ED Notes (Signed)
Pt belligerent, threatening staff and Dr. Marisa Severin, pt requesting food, informed him that we would be willing to give him food once he called down. Pt continues to make inappropriate statements regarding putting EKG leads on his penis.  Pt demanding food.

## 2023-01-13 NOTE — ED Provider Notes (Addendum)
[facility] Provider Note    Event Date/Time   First MD Initiated Contact with Patient 01/13/23 2004     (approximate)   History   Alcohol Intoxication   HPI  Randall Harvey. is a 62 y.o. male with a history of alcohol abuse, TBI, COPD, diabetes, and PE who presents with alcohol intoxication.  The patient was found stumbling around by a bystander.  The patient denies any acute complaints.  I reviewed the past medical records.  The patient presented with intoxication and suicidal ideation last week.  He was most recently evaluated by psychiatry on 4/9 due to SI at which time he was cleared for discharge.   Physical Exam   Triage Vital Signs: ED Triage Vitals  Enc Vitals Group     BP 01/13/23 2000 101/70     Pulse Rate 01/13/23 2000 84     Resp 01/13/23 2000 17     Temp --      Temp Source 01/13/23 2000 Oral     SpO2 01/13/23 2000 93 %     Weight 01/13/23 2001 171 lb 1.2 oz (77.6 kg)     Height 01/13/23 2001  (1.702 m)     Head Circumference --      Peak Flow --      Pain Score 01/13/23 2002 0     Pain Loc --      Pain Edu? --      Excl. in GC? --     Most recent vital signs: Vitals:   01/13/23 2110 01/13/23 2130  BP: 105/62 94/66  Pulse: 84 78  Resp: 16 18  SpO2: 98% 98%     General: Alert, intoxicated appearing, slurred speech. CV:  Good peripheral perfusion.  Resp:  Normal effort.  Abd:  No distention.  Other:  EOMI.  PERRLA.  No midline cervical spinal tenderness.  Motor intact in all extremities.  Agitated, uncooperative.   ED Results / Procedures / Treatments   Labs (all labs ordered are listed, but only abnormal results are displayed) Labs Reviewed  BASIC METABOLIC PANEL - Abnormal; Notable for the following components:      Result Value   CO2 21 (*)    Glucose, Bld 101 (*)    Calcium 8.8 (*)    All other components within normal limits  CBC WITH DIFFERENTIAL/PLATELET - Abnormal; Notable for the following  components:   Lymphs Abs 4.8 (*)    All other components within normal limits  ETHANOL - Abnormal; Notable for the following components:   Alcohol, Ethyl (B) 250 (*)    All other components within normal limits     EKG    RADIOLOGY    PROCEDURES:  Critical Care performed: No  Procedures   MEDICATIONS ORDERED IN ED: Medications  haloperidol lactate (HALDOL) injection 5 mg (5 mg Intramuscular Given 01/13/23 2044)  LORazepam (ATIVAN) injection 2 mg (2 mg Intramuscular Given 01/13/23 2044)     IMPRESSION / MDM / ASSESSMENT AND PLAN / ED COURSE  I reviewed the triage vital signs and the nursing notes.  62 year old male with PMH as noted above presents with alcohol intoxication.  Differential diagnosis includes, but is not limited to, alcohol intoxication, polysubstance abuse.  We will obtain basic labs and observe the patient to sobriety.  Patient's presentation is most consistent with severe exacerbation of chronic illness.  ----------------------------------------- 8:45 PM on 01/13/2023 -----------------------------------------  The patient became acutely agitated and started trying to climb  out of the bed, screaming "get these demons out of my head."  As the patient was about to climb out of the bed and potentially injure himself, I had to hold him in order to prevent him from falling out of the bed.  The patient became increasingly combative and started threatening to kill me.  However I was able to verbally de-escalate and the patient became apologetic.  We gave IM Haldol and Ativan for sedation due to imminent risk of self injury.  However, at this time there is no indication for involuntary commitment.  The patient's presentation appears entirely due to alcohol intoxication.  I do not suspect an acute psychiatric etiology of his symptoms.  He will need to be reassessed when sober.  ----------------------------------------- 11:06 PM on  01/13/2023 -----------------------------------------  Ethanol level is 250.  Blood chemistry is normal.  Plan will be to observe to sobriety.  I have signed the patient out to the oncoming ED physician Dr. Dolores Frame.   FINAL CLINICAL IMPRESSION(S) / ED DIAGNOSES   Final diagnoses:  Alcoholic intoxication without complication     Rx / DC Orders   ED Discharge Orders     None        Note:  This document was prepared using Dragon voice recognition software and may include unintentional dictation errors.   Dionne Bucy, MD 01/13/23 1610    Dionne Bucy, MD 01/13/23 2308

## 2023-01-13 NOTE — ED Notes (Signed)
Patient pulled out IV and disconnected self from BP, pulse ox, and cardiac leads. RN wrapped IV site and reattached cords.

## 2023-01-13 NOTE — ED Notes (Signed)
EDT was connecting pt to the cardiac monitor. Pt took his right fist, reared it back, and swung at this EDT. EDT moved out of the way and was not hit by pts fist. Pts intentions unclear. Pt made aware that behavior towards staff would not be tolerated. Pt said"Okay I wasn't trying to hurt anyone".

## 2023-01-13 NOTE — ED Notes (Signed)
Patient given [place] sandwich tray in bed.

## 2023-01-13 NOTE — ED Notes (Signed)
Patient resting in bed with eyes closed.

## 2023-01-13 NOTE — ED Triage Notes (Signed)
Medic called for patient falling all over side of the road, patient observed to be intoxicated, combative. Medic reports patient verbalized suicidal ideation.

## 2023-01-13 NOTE — ED Notes (Signed)
Patient denies pain and is resting comfortably.  

## 2023-01-13 NOTE — ED Notes (Addendum)
Patient intoxicated and stating "I want to die,I wish I was dead". No plan to kill self. Patient states "I want to kill people that aint worth a damn". "I don't give a damn if I live or die". Patient began laughing after statements. RN will continue to monitor patient and reassess.

## 2023-01-14 MED ORDER — SODIUM CHLORIDE 0.9 % IV BOLUS
1000.0000 mL | Freq: Once | INTRAVENOUS | Status: AC
  Administered 2023-01-14: 1000 mL via INTRAVENOUS

## 2023-01-14 NOTE — ED Provider Notes (Signed)
-----------------------------------------   5:39 AM on 01/14/2023 -----------------------------------------   No events overnight.  Patient wakes up easily to voice.  Voices no desire for SI/HI.  Has no one to pick him up.  Charge nurse will provide bus pass.  Patient will be discharged approximately 6:30 AM to catch the bus.  Strict return precautions given.  Patient verbalizes understanding and agrees with plan of care.   Irean Hong, MD 01/14/23 [phone]

## 2023-01-14 NOTE — ED Notes (Signed)
Patient blood pressure 86/67, provider ordered an IV and 1 liter NS bolus.

## 2023-01-14 NOTE — Discharge Instructions (Signed)
Drink alcohol only in moderation.  Return to the ER for worsening symptoms, persistent vomiting, difficulty breathing or other concerns. 

## 2023-02-19 ENCOUNTER — Emergency Department: Payer: Medicare HMO

## 2023-02-19 ENCOUNTER — Emergency Department
Admission: EM | Admit: 2023-02-19 | Discharge: 2023-02-19 | Payer: Medicare HMO | Attending: Emergency Medicine | Admitting: Emergency Medicine

## 2023-02-19 ENCOUNTER — Other Ambulatory Visit: Payer: Self-pay

## 2023-02-19 DIAGNOSIS — Z23 Encounter for immunization: Secondary | ICD-10-CM | POA: Insufficient documentation

## 2023-02-19 DIAGNOSIS — S0990XA Unspecified injury of head, initial encounter: Secondary | ICD-10-CM | POA: Diagnosis not present

## 2023-02-19 DIAGNOSIS — J449 Chronic obstructive pulmonary disease, unspecified: Secondary | ICD-10-CM | POA: Diagnosis not present

## 2023-02-19 DIAGNOSIS — S199XXA Unspecified injury of neck, initial encounter: Secondary | ICD-10-CM | POA: Diagnosis not present

## 2023-02-19 DIAGNOSIS — Z7901 Long term (current) use of anticoagulants: Secondary | ICD-10-CM | POA: Insufficient documentation

## 2023-02-19 MED ORDER — DROPERIDOL 2.5 MG/ML IJ SOLN
5.0000 mg | Freq: Once | INTRAMUSCULAR | Status: AC
  Administered 2023-02-19: 5 mg via INTRAMUSCULAR
  Filled 2023-02-19: qty 2

## 2023-02-19 MED ORDER — TETANUS-DIPHTH-ACELL PERTUSSIS 5-2.5-18.5 LF-MCG/0.5 IM SUSY
0.5000 mL | PREFILLED_SYRINGE | Freq: Once | INTRAMUSCULAR | Status: AC
  Administered 2023-02-19: 0.5 mL via INTRAMUSCULAR
  Filled 2023-02-19: qty 0.5

## 2023-02-19 NOTE — ED Notes (Signed)
Face cleaned by this Clinical research associate and Cammy Copa, NT. Pt appreciative.

## 2023-02-19 NOTE — ED Notes (Signed)
Pt hitting head against wall. Screaming at staff

## 2023-02-19 NOTE — ED Notes (Signed)
Pt hitting head on wall. Standing up and screaming. Kicking feet. Pt with labile emotions

## 2023-02-19 NOTE — ED Provider Notes (Signed)
[facility] Provider Note    Event Date/Time   First MD Initiated Contact with Patient 02/19/23 1400     (approximate)   History   Medical Clearance   HPI  Randall Harvey. is a 62 y.o. male   Past medical history of substance use, alcohol use, PE on anticoagulation, COPD, thrombocytopenia who presents to the emergency department under police custody for some sort of disturbance where he was arguing with another person who the patient states was threatening him.  He denies any injuries during this altercation but the police were called and arrested him.  The police officer I spoke with said he became agitated en route and started hitting his head against the vehicle.  Patient is now much calmer after receiving sedation IM in triage.  He is able to tell me that he had an argument because somebody else threatened him he has some pain in his head from where he sustained an injury from hitting his head against the vehicle.  He denies any drug or alcohol use.  He states that he used to be on blood thinners but no longer because his doctor canceled and has been off of for several years.  He has no other acute medical complaints.  Independent Historian contributed to assessment above: Emergency planning/management officer corroborates information gathered above       Physical Exam   Triage Vital Signs: ED Triage Vitals  Enc Vitals Group     BP --      Pulse --      Resp 02/19/23 1311 (!) 22     Temp 02/19/23 1311 98.9 F (37.2 C)     Temp src --      SpO2 --      Weight 02/19/23 1303 170 lb (77.1 kg)     Height --      Head Circumference --      Peak Flow --      Pain Score 02/19/23 1302 6     Pain Loc --      Pain Edu? --      Excl. in GC? --     Most recent vital signs: Vitals:   02/19/23 1311  Resp: (!) 22  Temp: 98.9 F (37.2 C)    General: Awake, no distress.  CV:  Good peripheral perfusion.  Resp:  Normal effort.  Abd:  No distention.  Other:  Moving all  extremities.  Awake alert cooperative at this time after receiving some sedation by previous provider.  He has bleeding and a skin flap, small over his center of his forehead.  Skin flap is nearly completely detached nonviable slough removed.  Neck is supple with full range of motion no other signs of injury or tenderness to palpation from the neck below.   ED Results / Procedures / Treatments   Labs (all labs ordered are listed, but only abnormal results are displayed) Labs Reviewed - No data to display      RADIOLOGY I independently reviewed and interpreted CT of the head see no obvious bleeding or midline shift   PROCEDURES:  Critical Care performed: No  Procedures   MEDICATIONS ORDERED IN ED: Medications  droperidol (INAPSINE) 2.5 MG/ML injection 5 mg (5 mg Intramuscular Given 02/19/23 1315)  Tdap (BOOSTRIX) injection 0.5 mL (0.5 mLs Intramuscular Given 02/19/23 1404)   IMPRESSION / MDM / ASSESSMENT AND PLAN / ED COURSE  I reviewed the triage vital signs and the nursing notes.  Patient's presentation is most consistent with acute presentation with potential threat to life or bodily function.  Differential diagnosis includes, but is not limited to, intracranial bleeding, abrasion to the forehead, C-spine fracture dislocation, intoxication  The patient is on the cardiac monitor to evaluate for evidence of arrhythmia and/or significant heart rate changes.  MDM: This is a patient under police custody here for medical clearance but then injured his head by banging it against the vehicle.  I removed a skin flap that was on his forehead, nonviable.  He tolerated well.  I cleansed the area.  Ordered a Tdap given his unknown last Tdap status.  CT of the head and neck.  No other signs of trauma no other acute medical complaints.  His imaging is negative, will be discharged in police custody.       FINAL CLINICAL IMPRESSION(S) / ED DIAGNOSES    Final diagnoses:  Traumatic injury of head, initial encounter     Rx / DC Orders   ED Discharge Orders     None        Note:  This document was prepared using Dragon voice recognition software and may include unintentional dictation errors.    Pilar Jarvis, MD 02/19/23 [phone]

## 2023-02-19 NOTE — ED Notes (Signed)
Dr Jessup to triage to assess °

## 2023-02-19 NOTE — Discharge Instructions (Addendum)
Please keep your wound clean by washing at least daily with soap and water. If you see any signs of infection like spreading redness, pus coming from the wound, extreme pain, fevers, chills or any other worsening doctor right away or come back to the emergency department  

## 2023-02-19 NOTE — ED Notes (Signed)
Pt willingly took meds. States "put me to sleep"

## 2023-02-19 NOTE — ED Notes (Signed)
Patient transported back from CT 

## 2023-02-19 NOTE — ED Provider Notes (Signed)
-----------------------------------------   1:17 PM on 02/19/2023 ----------------------------------------- I was asked to evaluate the patient in triage as he is acting belligerently, appears intoxicated, uncooperative with any assessment.  He is currently in police custody, brought to the ED after he banged his head on the rails on the police car, now has significant bleeding around his face and scalp but will not allow for further evaluation.  He will require calming medication to ensure safety of patient and staff, will give IM droperidol.  Orders placed for CT imaging of head and neck, will need to reassess after calming medication.   Chesley Noon, MD 02/19/23 1318

## 2023-02-19 NOTE — ED Notes (Signed)
Patient transported to CT, escorted with Officer

## 2023-02-19 NOTE — ED Triage Notes (Signed)
Pt to ED with [employer] in handcuffs for medical clearance. Pt very belligerant, yelling, cursing out police in lobby in triage. Pt with significant amount of blood to face and scalp. Unable to assess vital signs. Unable to assess wound. Pt will not sit still. Pt will not follow commands. Pt will not cooperate.  PD reports head injury from hitting head on rails in car  Pt stating "I'm going to kick your ass you punk ass motherfucker"

## 2023-04-21 DIAGNOSIS — N183 Chronic kidney disease, stage 3 unspecified: Secondary | ICD-10-CM | POA: Diagnosis not present

## 2023-04-21 DIAGNOSIS — R6889 Other general symptoms and signs: Secondary | ICD-10-CM | POA: Diagnosis not present

## 2023-04-21 DIAGNOSIS — E1122 Type 2 diabetes mellitus with diabetic chronic kidney disease: Secondary | ICD-10-CM | POA: Diagnosis not present

## 2023-04-21 DIAGNOSIS — M170 Bilateral primary osteoarthritis of knee: Secondary | ICD-10-CM | POA: Diagnosis not present

## 2023-06-29 ENCOUNTER — Other Ambulatory Visit: Payer: Self-pay

## 2023-06-29 ENCOUNTER — Emergency Department
Admission: EM | Admit: 2023-06-29 | Discharge: 2023-06-29 | Disposition: A | Discharge status: Home / Self Care | Payer: Medicare HMO | Attending: Emergency Medicine | Admitting: Emergency Medicine

## 2023-06-29 ENCOUNTER — Emergency Department: Payer: Medicare HMO

## 2023-06-29 DIAGNOSIS — W01198A Fall on same level from slipping, tripping and stumbling with subsequent striking against other object, initial encounter: Secondary | ICD-10-CM | POA: Diagnosis not present

## 2023-06-29 DIAGNOSIS — S0231XA Fracture of orbital floor, right side, initial encounter for closed fracture: Secondary | ICD-10-CM | POA: Insufficient documentation

## 2023-06-29 DIAGNOSIS — Y9301 Activity, walking, marching and hiking: Secondary | ICD-10-CM | POA: Insufficient documentation

## 2023-06-29 DIAGNOSIS — S0990XA Unspecified injury of head, initial encounter: Secondary | ICD-10-CM | POA: Diagnosis present

## 2023-06-29 DIAGNOSIS — E119 Type 2 diabetes mellitus without complications: Secondary | ICD-10-CM | POA: Diagnosis not present

## 2023-06-29 DIAGNOSIS — Y92512 Supermarket, store or market as the place of occurrence of the external cause: Secondary | ICD-10-CM | POA: Diagnosis not present

## 2023-06-29 DIAGNOSIS — I10 Essential (primary) hypertension: Secondary | ICD-10-CM | POA: Diagnosis not present

## 2023-06-29 DIAGNOSIS — S066X0A Traumatic subarachnoid hemorrhage without loss of consciousness, initial encounter: Secondary | ICD-10-CM | POA: Diagnosis not present

## 2023-06-29 DIAGNOSIS — S065X0A Traumatic subdural hemorrhage without loss of consciousness, initial encounter: Secondary | ICD-10-CM | POA: Insufficient documentation

## 2023-06-29 DIAGNOSIS — J449 Chronic obstructive pulmonary disease, unspecified: Secondary | ICD-10-CM | POA: Diagnosis not present

## 2023-06-29 DIAGNOSIS — S022XXA Fracture of nasal bones, initial encounter for closed fracture: Secondary | ICD-10-CM | POA: Insufficient documentation

## 2023-06-29 DIAGNOSIS — S065XAA Traumatic subdural hemorrhage with loss of consciousness status unknown, initial encounter: Secondary | ICD-10-CM

## 2023-06-29 DIAGNOSIS — W19XXXA Unspecified fall, initial encounter: Secondary | ICD-10-CM

## 2023-06-29 LAB — BASIC METABOLIC PANEL
Anion gap: 8 (ref 5–15)
BUN: 14 mg/dL (ref 8–23)
CO2: 23 mmol/L (ref 22–32)
Calcium: 8 mg/dL — ABNORMAL LOW (ref 8.9–10.3)
Chloride: 107 mmol/L (ref 98–111)
Creatinine, Ser: 0.75 mg/dL (ref 0.61–1.24)
GFR, Estimated: >60 mL/min (ref >60)
Glucose, Bld: 183 mg/dL — ABNORMAL HIGH (ref 70–99)
Potassium: 3.8 mmol/L (ref 3.5–5.1)
Sodium: 138 mmol/L (ref 135–145)

## 2023-06-29 LAB — CBC
HCT: 40.8 % (ref 39.0–52.0)
Hemoglobin: 13.7 g/dL (ref 13.0–17.0)
MCH: 32.2 pg (ref 26.0–34.0)
MCHC: 33.6 g/dL (ref 30.0–36.0)
MCV: 95.8 fL (ref 80.0–100.0)
Platelets: 146 10*3/uL — ABNORMAL LOW (ref 150–400)
RBC: 4.26 MIL/uL (ref 4.22–5.81)
RDW: 11.7 % (ref 11.5–15.5)
WBC: 7.4 10*3/uL (ref 4.0–10.5)
nRBC: 0 % (ref 0.0–0.2)

## 2023-06-29 LAB — MAGNESIUM: Magnesium: 2 mg/dL (ref 1.7–2.4)

## 2023-06-29 MED ORDER — SODIUM CHLORIDE 0.9 % IV BOLUS
1000.0000 mL | Freq: Once | INTRAVENOUS | Status: AC
  Administered 2023-06-29: 1000 mL via INTRAVENOUS

## 2023-06-29 MED ORDER — CEPHALEXIN 500 MG PO CAPS: 500.0000 mg | ORAL_CAPSULE | Freq: Four times a day (QID) | ORAL | 0 refills | Status: AC

## 2023-06-29 MED ORDER — TETRACAINE HCL 0.5 % OP SOLN
1.0000 [drp] | Freq: Once | OPHTHALMIC | Status: AC
  Administered 2023-06-29: 1 [drp] via OPHTHALMIC
  Filled 2023-06-29: qty 4

## 2023-06-29 MED ORDER — FLUORESCEIN SODIUM 1 MG OP STRP
1.0000 | ORAL_STRIP | Freq: Once | OPHTHALMIC | Status: AC
  Administered 2023-06-29: 1 via OPHTHALMIC
  Filled 2023-06-29: qty 1

## 2023-06-29 MED ORDER — MORPHINE SULFATE (PF) 4 MG/ML IV SOLN
4.0000 mg | Freq: Once | INTRAVENOUS | Status: AC
  Administered 2023-06-29: 4 mg via INTRAVENOUS
  Filled 2023-06-29: qty 1

## 2023-06-29 NOTE — ED Provider Notes (Signed)
[facility] Provider Note    Event Date/Time   First MD Initiated Contact with Patient 06/29/23 [phone]     (approximate)   History   Chief Complaint Fall, Headache, and Eye Pain   HPI  Randall Harvey. is a 62 y.o. male with past medical history of diabetes, COPD, alcohol abuse, and DVT/PE who presents to the ED following fall.  Patient reports that 2 days ago he was walking to the store when his "knees gave out on me" and he fell to the ground.  He states that he hit his head on the sidewalk, where he struck his head and lost consciousness.  He was evaluated by EMS at that time but declined transport to the ED.  Since then, he has had increased pain and swelling around his right eye as well as posterior headache.  He states he has been taking Aleve without relief, also reports a "popping" sensation in his neck when turning to the side.  He denies any pain in his chest, abdomen, or extremities.     Physical Exam   Triage Vital Signs: ED Triage Vitals  Encounter Vitals Group     BP 06/29/23 0853 (!) 155/82     Systolic BP Percentile --      Diastolic BP Percentile --      Pulse Rate 06/29/23 0853 77     Resp 06/29/23 0853 20     Temp 06/29/23 0853 97.8 F (36.6 C)     Temp Source 06/29/23 0853 Oral     SpO2 06/29/23 0853 98 %     Weight 06/29/23 0850 165 lb (74.8 kg)     Height 06/29/23 0850 5\' 7"  (1.702 m)     Head Circumference --      Peak Flow --      Pain Score 06/29/23 0850 10     Pain Loc --      Pain Education --      Exclude from Growth Chart --     Most recent vital signs: Vitals:   06/29/23 0853  BP: (!) 155/82  Pulse: 77  Resp: 20  Temp: 97.8 F (36.6 C)  SpO2: 98%    Constitutional: Alert and oriented. Eyes: Conjunctivae are normal.  Pupils equal, round, reactive to light bilaterally.  Small amount of subconjunctival hemorrhage on right.  Extraocular movements intact bilaterally. Head: Right periorbital ecchymosis and edema  with healing abrasion. Nose: No congestion/rhinnorhea. Mouth/Throat: Mucous membranes are moist.  Neck: No midline cervical spine tenderness to palpation. Cardiovascular: Normal rate, regular rhythm. Grossly normal heart sounds.  2+ radial pulses bilaterally. Respiratory: Normal respiratory effort.  No retractions. Lungs CTAB.  No chest wall tenderness to palpation. Gastrointestinal: Soft and nontender. No distention. Musculoskeletal: No lower extremity tenderness nor edema.  No upper extremity bony tenderness to palpation. Neurologic:  Normal speech and language. No gross focal neurologic deficits are appreciated.    ED Results / Procedures / Treatments   Labs (all labs ordered are listed, but only abnormal results are displayed) Labs Reviewed  CBC - Abnormal; Notable for the following components:      Result Value   Platelets 146 (*)    All other components within normal limits  BASIC METABOLIC PANEL - Abnormal; Notable for the following components:   Glucose, Bld 183 (*)    Calcium 8.0 (*)    All other components within normal limits  MAGNESIUM     EKG  ED ECG REPORT I,  Chesley Noon, the attending physician, personally viewed and interpreted this ECG.   Date: 06/29/2023  EKG Time: 8:56  Rate: 74  Rhythm: normal sinus rhythm  Axis: Normal  Intervals:right bundle branch block  ST&T Change: None  RADIOLOGY CT head reviewed and interpreted by me with small amount of subdural hemorrhage, no midline shift noted.  PROCEDURES:  Critical Care performed: No  Procedures   MEDICATIONS ORDERED IN ED: Medications  sodium chloride 0.9 % bolus 1,000 mL (0 mLs Intravenous Stopped 06/29/23 1017)  fluorescein ophthalmic strip 1 strip (1 strip Right Eye Given by Other 06/29/23 1054)  tetracaine (PONTOCAINE) 0.5 % ophthalmic solution 1 drop (1 drop Right Eye Given by Other 06/29/23 1054)  morphine (PF) 4 MG/ML injection 4 mg (4 mg Intravenous Given 06/29/23 1017)      IMPRESSION / MDM / ASSESSMENT AND PLAN / ED COURSE  I reviewed the triage vital signs and the nursing notes.                              62 y.o. male with past medical history of diabetes, alcohol abuse, COPD, and DVT/PE who presents to the ED complaining of fall 2 nights ago, hitting his head with loss of consciousness.  Patient's presentation is most consistent with acute presentation with potential threat to life or bodily function.  Differential diagnosis includes, but is not limited to, intracranial injury, facial fracture, corneal abrasion, open globe, cervical spine injury, electrolyte abnormality.  Patient nontoxic-appearing and in no acute distress, vital signs are unremarkable.  EKG shows no evidence of arrhythmia or ischemia.  Patient has periorbital ecchymosis and edema to his right eye and we will further assess with CT imaging of his head, maxillofacial area, and cervical spine.  No evidence of trauma to his trunk or extremities.  Given his reported unsteadiness while walking, we will also screen labs.  No obvious signs of open globe given equal and reactive pupils, will further assess with fluorescein exam.  Labs are reassuring with no significant anemia, leukocytosis, tract abnormality, or AKI.  Magnesium level within normal limits.  CT imaging does show small amount of subdural hemorrhage and subarachnoid blood with no midline shift.  CT cervical spine negative for acute process, CT maxillofacial shows nasal bone fracture and likely nondisplaced orbital floor fracture.  There is subcutaneous air noted associated with this.  Patient evaluated by Dr. Alta Corning of neurosurgery, who states that he is appropriate for outpatient follow-up for subdural hemorrhage given injury was 2 days ago.  He is facial fractures may be followed up as an outpatient, will start on Keflex for antibiotic prophylaxis.  He was provided referral to follow-up with ophthalmology and ENT, counseled to return  to the ED for new or worsening symptoms.  Patient agrees with plan.      FINAL CLINICAL IMPRESSION(S) / ED DIAGNOSES   Final diagnoses:  Fall, initial encounter  Subdural hematoma (HCC)  Closed fracture of nasal bone, initial encounter  Closed fracture of right orbital floor, initial encounter (HCC)     Rx / DC Orders   ED Discharge Orders          Ordered    cephALEXin (KEFLEX) 500 MG capsule  4 times daily        06/29/23 1154             Note:  This document was prepared using Dragon voice recognition software and may include unintentional dictation  errors.   Chesley Noon, MD 06/29/23 [phone]

## 2023-06-29 NOTE — ED Notes (Signed)
Patient given a urinal to use the bathroom.

## 2023-06-29 NOTE — Consult Note (Signed)
NEUROSURGERY CONSULT NOTE   Assessment and Recommendations Randall Somera. is a 62 y.o. male with small, acute, asymptomatic left subdural hematoma.  As patient not on anticoagulants/antiplatelet medications and >24 hours from injury, no indication for further observation  -Recommend follow up with PCP in 1-2 weeks if persistent post concussive symptoms -Follow up with neurosurgery/ED if symptoms significantly worsen or new neurological deficits -discussed tylenol/NSAIDs prn for headache and management of further post concussive symptoms  I personally spent 30 minutes face-to-face and non-face-to-face in the care of this patient, which includes all pre, intra, and post visit time on the date of service.  All documented time was specific to E/M visit and does not include any procedures that may have been performed  Encounter diagnoses Fall, initial encounter  Subdural hematoma (HCC)  Closed fracture of nasal bone, initial encounter  Closed fracture of right orbital floor, initial encounter [facility])  History of present illness Randall Harvey. is a 62 y.o. male with PMH of DM, COPD, alcohol abuse, DVT, not on anticoagulation whom we are seeing today in the ED for consultation of a left subdural hematoma.  Patient reports 2/2 knee arthritis he has frequent falls with most recent occurring on Saturday morning.  This resulted in him striking his forehead/right orbit without LOC.  Since then has complained of headache and right perioribital pain/ecchymoses prompting evaluation in ED.  Reports headache but unable to quantify intensity.  Mostly to left frontal region.  Denies nausea/vomiting/neurological deficits.  Reports bloody discharge from noise but without concerns for CSF rhinorrhea.  Not on an AC/AP but currently taking NSAIDs for pain control  Physical Exam  Vital Signs:  BP (!) 155/82   Pulse 77   Temp 97.8 F (36.6 C) (Oral)   Resp 20   Ht 5\' 7"  (1.702 m)   Wt 74.8 kg   SpO2 98%    BMI 25.84 kg/m  General: No acute distress; Body mass index is 25.84 kg/m.  Neurological Exam: Oriented to time, place, and person. Good recent and remote memory. Normal attention span and concentration.  2nd Cranial Nerve:  Full visual fields to confrontation. 3rd, 4th, 6th Cranial Nerves:  Pupils equal, round, reactive to light; full extraocular movements. 5th Cranial Nerve: normal facial sensation. 7th Cranial Nerve: Full and symmetrical facial movement. 8th Cranial Nerve: decreased hearing bilaterally. 9th, 10th Cranial Nerves: Symmetrical palate 11th Cranial nerve: Normal trapezius and sternocleidomastoid. 12th Cranial nerve: Normal tongue movement.   Sensation: Intact to touch in the upper and lower extremities  Musculoskeletal: Gait normal. Station stable. 5/5 strength in the [place] and [place] Without drift  Coordination: Normal finger to nose  Neurological imaging reviewed CT head: acute 3 mm left sided subdural hematoma without significant midline shift.   ---Historical data---  Allergies Patient has no known allergies.  Medications   Medications reviewed in Epic.  Past Medical History Past Medical History:  Diagnosis Date   Alcohol intoxication (HCC)    Anticoagulant long-term use    lifetime use, coumadin therapy, then Xarelto   Anxiety    Arthritis    Cancer (HCC)    renal   Carotid atherosclerosis    <50% left and right   Clotting disorder (HCC)    COPD (chronic obstructive pulmonary disease) (HCC)    Depression    Elevated liver enzymes 11/2013   Family history of cancer    Fatty liver    H/O drug abuse (HCC)    H/O ETOH abuse  Hepatitis    History of traumatic brain injury    Hypertension    Lymphadenopathy    Obesity    Personal history of DVT (deep vein thrombosis) 06/2013   Pre-diabetes    Primary osteoarthritis of left knee    Pulmonary embolism and infarction (HCC) 06/2013   Pulmonary embolism and infarction (HCC) 11/2013   Pulmonary embolus  with infarction [facility])    Renal cell carcinoma (HCC)    Renal mass, left 06/2013   with ureteral obstruction   Stroke (HCC)    Subarachnoid hemorrhage following injury (HCC)    Thrombocythemia    Thrombocytopenia (HCC)    Tobacco abuse     Past Surgical History Past Surgical History:  Procedure Laterality Date   ANKLE FRACTURE SURGERY Right 12/2012   ANKLE SURGERY     EXCISION METACARPAL MASS Right 09/18/2016   Procedure: EXCISION METACARPAL MASS;  Surgeon: Kennedy Bucker, MD;  Location: [facility] ORS;  Service: Orthopedics;  Laterality: Right;  5th digit    HERNIA REPAIR     Umbilical Hernia   ORBITAL FRACTURE SURGERY Left    ROBOTIC ASSITED PARTIAL NEPHRECTOMY Left Oct 2014    Family History Family History  Problem Relation Age of Onset   Pancreatic cancer Mother    Colon cancer Father    Diabetes Father    Colon cancer Sister    COPD Sister    Seizures Sister    Ovarian cancer Sister    Cancer Paternal Grandmother        bone   Stroke Paternal Grandfather    Heart disease Paternal Grandfather    Hypertension Neg Hx     Social History Social History   Tobacco Use   Smoking status: Every Day    Current packs/day: 0.50    Types: Cigarettes   Smokeless tobacco: Never  Vaping Use   Vaping status: Never Used  Substance Use Topics   Alcohol use: Yes    Comment: occasionally   Drug use: Yes    Types: Marijuana    Comment: occ

## 2023-06-29 NOTE — ED Triage Notes (Addendum)
Pt to ED via POV from home. Pt reports Saturday had a fall from his legs giving out and fell forward hitting his face. Pt reports he woke up to EMS standing over him. Pt reports right eye swelling and pain has gotten worse. Pt reports unable to see out of right eye from swelling. Pt states he has been blowing his nose with blood clots. Pt denies blood thinners. Pt reports some etoh use. Pt states hx of SAH.

## 2023-07-15 ENCOUNTER — Inpatient Hospital Stay
Admission: AD | Admit: 2023-07-15 | Discharge: 2023-07-28 | DRG: 885 | Disposition: A | Discharge status: Home / Self Care | Payer: Medicare HMO | Source: Intra-hospital | Attending: Psychiatry | Admitting: Psychiatry

## 2023-07-15 ENCOUNTER — Other Ambulatory Visit: Payer: Self-pay

## 2023-07-15 ENCOUNTER — Encounter: Payer: Self-pay | Admitting: Internal Medicine

## 2023-07-15 ENCOUNTER — Emergency Department
Admission: EM | Admit: 2023-07-15 | Discharge: 2023-07-15 | Disposition: A | Discharge status: Other Acute Inpatient Hospital | Payer: Medicare HMO | Attending: Emergency Medicine | Admitting: Emergency Medicine

## 2023-07-15 ENCOUNTER — Encounter: Payer: Self-pay | Admitting: Emergency Medicine

## 2023-07-15 ENCOUNTER — Emergency Department: Payer: Medicare HMO

## 2023-07-15 ENCOUNTER — Emergency Department
Admission: EM | Admit: 2023-07-15 | Discharge: 2023-07-15 | Disposition: A | Discharge status: Other Acute Inpatient Hospital | Payer: Medicare HMO | Source: Home / Self Care | Attending: Emergency Medicine | Admitting: Emergency Medicine

## 2023-07-15 DIAGNOSIS — H8110 Benign paroxysmal vertigo, unspecified ear: Secondary | ICD-10-CM | POA: Diagnosis present

## 2023-07-15 DIAGNOSIS — Z79899 Other long term (current) drug therapy: Secondary | ICD-10-CM

## 2023-07-15 DIAGNOSIS — R7303 Prediabetes: Secondary | ICD-10-CM | POA: Diagnosis present

## 2023-07-15 DIAGNOSIS — Z20822 Contact with and (suspected) exposure to covid-19: Secondary | ICD-10-CM | POA: Insufficient documentation

## 2023-07-15 DIAGNOSIS — J439 Emphysema, unspecified: Secondary | ICD-10-CM | POA: Diagnosis not present

## 2023-07-15 DIAGNOSIS — Y92480 Sidewalk as the place of occurrence of the external cause: Secondary | ICD-10-CM | POA: Diagnosis not present

## 2023-07-15 DIAGNOSIS — M542 Cervicalgia: Secondary | ICD-10-CM | POA: Diagnosis not present

## 2023-07-15 DIAGNOSIS — R630 Anorexia: Secondary | ICD-10-CM | POA: Diagnosis present

## 2023-07-15 DIAGNOSIS — F419 Anxiety disorder, unspecified: Secondary | ICD-10-CM | POA: Diagnosis present

## 2023-07-15 DIAGNOSIS — Z8619 Personal history of other infectious and parasitic diseases: Secondary | ICD-10-CM

## 2023-07-15 DIAGNOSIS — I10 Essential (primary) hypertension: Secondary | ICD-10-CM | POA: Insufficient documentation

## 2023-07-15 DIAGNOSIS — I609 Nontraumatic subarachnoid hemorrhage, unspecified: Secondary | ICD-10-CM | POA: Diagnosis not present

## 2023-07-15 DIAGNOSIS — Y900 Blood alcohol level of less than 20 mg/100 ml: Secondary | ICD-10-CM | POA: Insufficient documentation

## 2023-07-15 DIAGNOSIS — F1914 Other psychoactive substance abuse with psychoactive substance-induced mood disorder: Secondary | ICD-10-CM | POA: Insufficient documentation

## 2023-07-15 DIAGNOSIS — F1020 Alcohol dependence, uncomplicated: Secondary | ICD-10-CM | POA: Insufficient documentation

## 2023-07-15 DIAGNOSIS — Z9181 History of falling: Secondary | ICD-10-CM

## 2023-07-15 DIAGNOSIS — Z5901 Sheltered homelessness: Secondary | ICD-10-CM | POA: Insufficient documentation

## 2023-07-15 DIAGNOSIS — F1310 Sedative, hypnotic or anxiolytic abuse, uncomplicated: Secondary | ICD-10-CM | POA: Insufficient documentation

## 2023-07-15 DIAGNOSIS — I6782 Cerebral ischemia: Secondary | ICD-10-CM | POA: Diagnosis not present

## 2023-07-15 DIAGNOSIS — S0990XA Unspecified injury of head, initial encounter: Secondary | ICD-10-CM | POA: Diagnosis not present

## 2023-07-15 DIAGNOSIS — M4802 Spinal stenosis, cervical region: Secondary | ICD-10-CM | POA: Diagnosis not present

## 2023-07-15 DIAGNOSIS — Z7951 Long term (current) use of inhaled steroids: Secondary | ICD-10-CM | POA: Insufficient documentation

## 2023-07-15 DIAGNOSIS — F332 Major depressive disorder, recurrent severe without psychotic features: Secondary | ICD-10-CM | POA: Diagnosis not present

## 2023-07-15 DIAGNOSIS — R590 Localized enlarged lymph nodes: Secondary | ICD-10-CM | POA: Diagnosis not present

## 2023-07-15 DIAGNOSIS — Z87820 Personal history of traumatic brain injury: Secondary | ICD-10-CM | POA: Insufficient documentation

## 2023-07-15 DIAGNOSIS — Z8249 Family history of ischemic heart disease and other diseases of the circulatory system: Secondary | ICD-10-CM

## 2023-07-15 DIAGNOSIS — R45851 Suicidal ideations: Secondary | ICD-10-CM | POA: Insufficient documentation

## 2023-07-15 DIAGNOSIS — F10920 Alcohol use, unspecified with intoxication, uncomplicated: Secondary | ICD-10-CM | POA: Diagnosis not present

## 2023-07-15 DIAGNOSIS — Z823 Family history of stroke: Secondary | ICD-10-CM

## 2023-07-15 DIAGNOSIS — F1410 Cocaine abuse, uncomplicated: Secondary | ICD-10-CM | POA: Insufficient documentation

## 2023-07-15 DIAGNOSIS — W19XXXA Unspecified fall, initial encounter: Secondary | ICD-10-CM | POA: Diagnosis present

## 2023-07-15 DIAGNOSIS — F1024 Alcohol dependence with alcohol-induced mood disorder: Secondary | ICD-10-CM | POA: Diagnosis not present

## 2023-07-15 DIAGNOSIS — Z808 Family history of malignant neoplasm of other organs or systems: Secondary | ICD-10-CM

## 2023-07-15 DIAGNOSIS — H9190 Unspecified hearing loss, unspecified ear: Secondary | ICD-10-CM | POA: Diagnosis present

## 2023-07-15 DIAGNOSIS — Z5900 Homelessness unspecified: Secondary | ICD-10-CM | POA: Diagnosis not present

## 2023-07-15 DIAGNOSIS — H9313 Tinnitus, bilateral: Secondary | ICD-10-CM | POA: Diagnosis not present

## 2023-07-15 DIAGNOSIS — F17210 Nicotine dependence, cigarettes, uncomplicated: Secondary | ICD-10-CM | POA: Insufficient documentation

## 2023-07-15 DIAGNOSIS — Z86711 Personal history of pulmonary embolism: Secondary | ICD-10-CM

## 2023-07-15 DIAGNOSIS — S199XXA Unspecified injury of neck, initial encounter: Secondary | ICD-10-CM | POA: Diagnosis not present

## 2023-07-15 DIAGNOSIS — Y907 Blood alcohol level of 200-239 mg/100 ml: Secondary | ICD-10-CM | POA: Diagnosis not present

## 2023-07-15 DIAGNOSIS — Z5941 Food insecurity: Secondary | ICD-10-CM

## 2023-07-15 DIAGNOSIS — R519 Headache, unspecified: Secondary | ICD-10-CM | POA: Diagnosis present

## 2023-07-15 DIAGNOSIS — K760 Fatty (change of) liver, not elsewhere classified: Secondary | ICD-10-CM | POA: Diagnosis present

## 2023-07-15 DIAGNOSIS — M170 Bilateral primary osteoarthritis of knee: Secondary | ICD-10-CM | POA: Diagnosis present

## 2023-07-15 DIAGNOSIS — J449 Chronic obstructive pulmonary disease, unspecified: Secondary | ICD-10-CM | POA: Diagnosis present

## 2023-07-15 DIAGNOSIS — S066XAA Traumatic subarachnoid hemorrhage with loss of consciousness status unknown, initial encounter: Secondary | ICD-10-CM | POA: Diagnosis present

## 2023-07-15 DIAGNOSIS — F329 Major depressive disorder, single episode, unspecified: Principal | ICD-10-CM | POA: Diagnosis present

## 2023-07-15 DIAGNOSIS — Z23 Encounter for immunization: Secondary | ICD-10-CM | POA: Diagnosis not present

## 2023-07-15 DIAGNOSIS — F1494 Cocaine use, unspecified with cocaine-induced mood disorder: Secondary | ICD-10-CM

## 2023-07-15 DIAGNOSIS — W01198A Fall on same level from slipping, tripping and stumbling with subsequent striking against other object, initial encounter: Secondary | ICD-10-CM | POA: Insufficient documentation

## 2023-07-15 DIAGNOSIS — R9431 Abnormal electrocardiogram [ECG] [EKG]: Secondary | ICD-10-CM | POA: Diagnosis not present

## 2023-07-15 DIAGNOSIS — Y9301 Activity, walking, marching and hiking: Secondary | ICD-10-CM | POA: Diagnosis not present

## 2023-07-15 DIAGNOSIS — Z7901 Long term (current) use of anticoagulants: Secondary | ICD-10-CM

## 2023-07-15 DIAGNOSIS — F1910 Other psychoactive substance abuse, uncomplicated: Secondary | ICD-10-CM | POA: Diagnosis not present

## 2023-07-15 DIAGNOSIS — D689 Coagulation defect, unspecified: Secondary | ICD-10-CM | POA: Diagnosis not present

## 2023-07-15 DIAGNOSIS — I672 Cerebral atherosclerosis: Secondary | ICD-10-CM | POA: Diagnosis not present

## 2023-07-15 DIAGNOSIS — Z905 Acquired absence of kidney: Secondary | ICD-10-CM

## 2023-07-15 DIAGNOSIS — Z862 Personal history of diseases of the blood and blood-forming organs and certain disorders involving the immune mechanism: Secondary | ICD-10-CM

## 2023-07-15 DIAGNOSIS — Z8673 Personal history of transient ischemic attack (TIA), and cerebral infarction without residual deficits: Secondary | ICD-10-CM

## 2023-07-15 DIAGNOSIS — Z85528 Personal history of other malignant neoplasm of kidney: Secondary | ICD-10-CM

## 2023-07-15 DIAGNOSIS — Z9889 Other specified postprocedural states: Secondary | ICD-10-CM

## 2023-07-15 DIAGNOSIS — Z8041 Family history of malignant neoplasm of ovary: Secondary | ICD-10-CM

## 2023-07-15 DIAGNOSIS — Z6825 Body mass index (BMI) 25.0-25.9, adult: Secondary | ICD-10-CM

## 2023-07-15 DIAGNOSIS — Z800 Family history of malignant neoplasm of digestive organs: Secondary | ICD-10-CM

## 2023-07-15 DIAGNOSIS — F10929 Alcohol use, unspecified with intoxication, unspecified: Secondary | ICD-10-CM | POA: Diagnosis not present

## 2023-07-15 DIAGNOSIS — S065XAA Traumatic subdural hemorrhage with loss of consciousness status unknown, initial encounter: Secondary | ICD-10-CM | POA: Diagnosis not present

## 2023-07-15 DIAGNOSIS — S022XXA Fracture of nasal bones, initial encounter for closed fracture: Secondary | ICD-10-CM | POA: Diagnosis not present

## 2023-07-15 DIAGNOSIS — F411 Generalized anxiety disorder: Secondary | ICD-10-CM | POA: Diagnosis not present

## 2023-07-15 DIAGNOSIS — Z820 Family history of epilepsy and other diseases of the nervous system: Secondary | ICD-10-CM

## 2023-07-15 DIAGNOSIS — Z86718 Personal history of other venous thrombosis and embolism: Secondary | ICD-10-CM

## 2023-07-15 DIAGNOSIS — F1994 Other psychoactive substance use, unspecified with psychoactive substance-induced mood disorder: Secondary | ICD-10-CM | POA: Insufficient documentation

## 2023-07-15 DIAGNOSIS — Z043 Encounter for examination and observation following other accident: Secondary | ICD-10-CM | POA: Diagnosis not present

## 2023-07-15 DIAGNOSIS — F331 Major depressive disorder, recurrent, moderate: Secondary | ICD-10-CM | POA: Diagnosis not present

## 2023-07-15 DIAGNOSIS — Z825 Family history of asthma and other chronic lower respiratory diseases: Secondary | ICD-10-CM

## 2023-07-15 DIAGNOSIS — R599 Enlarged lymph nodes, unspecified: Secondary | ICD-10-CM | POA: Diagnosis not present

## 2023-07-15 DIAGNOSIS — Y92410 Unspecified street and highway as the place of occurrence of the external cause: Secondary | ICD-10-CM | POA: Diagnosis not present

## 2023-07-15 DIAGNOSIS — Z5982 Transportation insecurity: Secondary | ICD-10-CM | POA: Diagnosis not present

## 2023-07-15 DIAGNOSIS — S0219XA Other fracture of base of skull, initial encounter for closed fracture: Secondary | ICD-10-CM | POA: Diagnosis not present

## 2023-07-15 DIAGNOSIS — F10129 Alcohol abuse with intoxication, unspecified: Secondary | ICD-10-CM | POA: Diagnosis not present

## 2023-07-15 DIAGNOSIS — Z833 Family history of diabetes mellitus: Secondary | ICD-10-CM

## 2023-07-15 LAB — CBC
HCT: 45 % (ref 39.0–52.0)
Hemoglobin: 15 g/dL (ref 13.0–17.0)
MCH: 32.2 pg (ref 26.0–34.0)
MCHC: 33.3 g/dL (ref 30.0–36.0)
MCV: 96.6 fL (ref 80.0–100.0)
Platelets: 203 10*3/uL (ref 150–400)
RBC: 4.66 MIL/uL (ref 4.22–5.81)
RDW: 11.7 % (ref 11.5–15.5)
WBC: 9 10*3/uL (ref 4.0–10.5)
nRBC: 0 % (ref 0.0–0.2)

## 2023-07-15 LAB — BASIC METABOLIC PANEL
Anion gap: 10 (ref 5–15)
BUN: 12 mg/dL (ref 8–23)
CO2: 27 mmol/L (ref 22–32)
Calcium: 9.1 mg/dL (ref 8.9–10.3)
Chloride: 98 mmol/L (ref 98–111)
Creatinine, Ser: 0.91 mg/dL (ref 0.61–1.24)
GFR, Estimated: >60 mL/min (ref >60)
Glucose, Bld: 121 mg/dL — ABNORMAL HIGH (ref 70–99)
Potassium: 4.9 mmol/L (ref 3.5–5.1)
Sodium: 135 mmol/L (ref 135–145)

## 2023-07-15 LAB — URINE DRUG SCREEN, QUALITATIVE (ARMC ONLY)
Amphetamines, Ur Screen: NOT DETECTED
Barbiturates, Ur Screen: POSITIVE — AB
Benzodiazepine, Ur Scrn: NOT DETECTED
Cannabinoid 50 Ng, Ur ~~LOC~~: NOT DETECTED
Cocaine Metabolite,Ur ~~LOC~~: POSITIVE — AB
MDMA (Ecstasy)Ur Screen: NOT DETECTED
Methadone Scn, Ur: NOT DETECTED
Opiate, Ur Screen: NOT DETECTED
Phencyclidine (PCP) Ur S: NOT DETECTED
Tricyclic, Ur Screen: NOT DETECTED

## 2023-07-15 LAB — ETHANOL: Alcohol, Ethyl (B): <10 mg/dL (ref <10)

## 2023-07-15 LAB — SALICYLATE LEVEL: Salicylate Lvl: <7 mg/dL — ABNORMAL LOW (ref 7.0–30.0)

## 2023-07-15 LAB — ACETAMINOPHEN LEVEL: Acetaminophen (Tylenol), Serum: 14 ug/mL (ref 10–30)

## 2023-07-15 LAB — SARS CORONAVIRUS 2 BY RT PCR: SARS Coronavirus 2 by RT PCR: NEGATIVE

## 2023-07-15 MED ORDER — IBUPROFEN 200 MG PO TABS
600.0000 mg | ORAL_TABLET | Freq: Once | ORAL | Status: AC
  Administered 2023-07-15: 600 mg via ORAL
  Filled 2023-07-15: qty 3

## 2023-07-15 MED ORDER — SERTRALINE HCL 50 MG PO TABS
25.0000 mg | ORAL_TABLET | Freq: Every day | ORAL | Status: DC
  Administered 2023-07-15: 25 mg via ORAL
  Filled 2023-07-15: qty 1

## 2023-07-15 MED ORDER — TRAZODONE HCL 50 MG PO TABS: 50.0000 mg | ORAL_TABLET | Freq: Every evening | ORAL | Status: DC | PRN

## 2023-07-15 MED ORDER — HYDROXYZINE HCL 25 MG PO TABS
25.0000 mg | ORAL_TABLET | Freq: Three times a day (TID) | ORAL | Status: DC | PRN
  Administered 2023-07-15 – 2023-07-28 (×19): 25 mg via ORAL
  Filled 2023-07-15 (×21): qty 1

## 2023-07-15 MED ORDER — ACETAMINOPHEN 325 MG PO TABS
650.0000 mg | ORAL_TABLET | Freq: Four times a day (QID) | ORAL | Status: DC | PRN
  Administered 2023-07-15 – 2023-07-16 (×3): 650 mg via ORAL
  Filled 2023-07-15 (×3): qty 2

## 2023-07-15 MED ORDER — ACETAMINOPHEN 500 MG PO TABS
1000.0000 mg | ORAL_TABLET | Freq: Once | ORAL | Status: AC
  Administered 2023-07-15: 1000 mg via ORAL
  Filled 2023-07-15: qty 2

## 2023-07-15 MED ORDER — INFLUENZA VIRUS VACC SPLIT PF (FLUZONE) 0.5 ML IM SUSY
0.5000 mL | PREFILLED_SYRINGE | INTRAMUSCULAR | Status: AC
  Administered 2023-07-16: 0.5 mL via INTRAMUSCULAR
  Filled 2023-07-15: qty 0.5

## 2023-07-15 MED ORDER — LORAZEPAM 1 MG PO TABS: 2.0000 mg | ORAL_TABLET | Freq: Three times a day (TID) | ORAL | Status: DC | PRN

## 2023-07-15 MED ORDER — NICOTINE 14 MG/24HR TD PT24
14.0000 mg | MEDICATED_PATCH | Freq: Every day | TRANSDERMAL | Status: DC
  Administered 2023-07-16 – 2023-07-24 (×9): 14 mg via TRANSDERMAL
  Filled 2023-07-15 (×11): qty 1

## 2023-07-15 MED ORDER — TRAZODONE HCL 50 MG PO TABS
50.0000 mg | ORAL_TABLET | Freq: Every evening | ORAL | Status: DC | PRN
  Administered 2023-07-15: 50 mg via ORAL
  Filled 2023-07-15: qty 1

## 2023-07-15 MED ORDER — DIPHENHYDRAMINE HCL 50 MG/ML IJ SOLN: 50.0000 mg | Freq: Three times a day (TID) | INTRAMUSCULAR | Status: DC | PRN

## 2023-07-15 MED ORDER — ALUM & MAG HYDROXIDE-SIMETH 200-200-20 MG/5ML PO SUSP: 30.0000 mL | ORAL | Status: DC | PRN

## 2023-07-15 MED ORDER — FOLIC ACID 1 MG PO TABS
1.0000 mg | ORAL_TABLET | Freq: Every day | ORAL | Status: DC
  Administered 2023-07-15: 1 mg via ORAL
  Filled 2023-07-15: qty 1

## 2023-07-15 MED ORDER — DOCUSATE SODIUM 100 MG PO CAPS
100.0000 mg | ORAL_CAPSULE | Freq: Two times a day (BID) | ORAL | Status: DC
  Administered 2023-07-15 – 2023-07-26 (×16): 100 mg via ORAL
  Filled 2023-07-15 (×22): qty 1

## 2023-07-15 MED ORDER — HYDROXYZINE HCL 25 MG PO TABS
25.0000 mg | ORAL_TABLET | Freq: Three times a day (TID) | ORAL | Status: DC | PRN
  Administered 2023-07-15: 25 mg via ORAL
  Filled 2023-07-15: qty 1

## 2023-07-15 MED ORDER — DIPHENHYDRAMINE HCL 25 MG PO CAPS: 50.0000 mg | ORAL_CAPSULE | Freq: Three times a day (TID) | ORAL | Status: DC | PRN

## 2023-07-15 MED ORDER — HALOPERIDOL LACTATE 5 MG/ML IJ SOLN: 5.0000 mg | Freq: Three times a day (TID) | INTRAMUSCULAR | Status: DC | PRN

## 2023-07-15 MED ORDER — BUTALBITAL-APAP-CAFFEINE 50-325-40 MG PO TABS
2.0000 | ORAL_TABLET | Freq: Once | ORAL | Status: AC
  Administered 2023-07-15: 2 via ORAL
  Filled 2023-07-15: qty 2

## 2023-07-15 MED ORDER — LORAZEPAM 2 MG/ML IJ SOLN: 2.0000 mg | Freq: Three times a day (TID) | INTRAMUSCULAR | Status: DC | PRN

## 2023-07-15 MED ORDER — HALOPERIDOL 5 MG PO TABS: 5.0000 mg | ORAL_TABLET | Freq: Three times a day (TID) | ORAL | Status: DC | PRN

## 2023-07-15 MED ORDER — MELATONIN 5 MG PO TABS
5.0000 mg | ORAL_TABLET | Freq: Once | ORAL | Status: AC
  Administered 2023-07-15: 5 mg via ORAL
  Filled 2023-07-15: qty 1

## 2023-07-15 MED ORDER — MAGNESIUM HYDROXIDE 400 MG/5ML PO SUSP: 30.0000 mL | Freq: Every day | ORAL | Status: DC | PRN

## 2023-07-15 MED ORDER — SERTRALINE HCL 25 MG PO TABS
25.0000 mg | ORAL_TABLET | Freq: Every day | ORAL | Status: DC
  Administered 2023-07-16 – 2023-07-21 (×6): 25 mg via ORAL
  Filled 2023-07-15 (×6): qty 1

## 2023-07-15 MED ORDER — THIAMINE HCL 100 MG PO TABS
100.0000 mg | ORAL_TABLET | Freq: Every day | ORAL | Status: DC
  Administered 2023-07-15: 100 mg via ORAL
  Filled 2023-07-15 (×2): qty 1

## 2023-07-15 NOTE — ED Notes (Signed)
Transferred to [facility] gerosych unit via wheelchair by EDT and security. Belongings and voluntary consent sent with staff.

## 2023-07-15 NOTE — ED Triage Notes (Addendum)
EMS brings pt in from [place] [facility], pt is homeless and st he fell walking down sidewalk; c/o "head pain" after hitting left side forehead on sidewalk; reports +LOC, denies any cervical tenderness with palpation
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
## 2023-07-15 NOTE — ED Notes (Signed)
Declined shower.

## 2023-07-15 NOTE — Group Note (Signed)
Date:  07/15/2023 Time:  5:58 PM  Group Topic/Focus:  Making Healthy Choices:   The focus of this group is to help patients identify negative/unhealthy choices they were using prior to admission and identify positive/healthier coping strategies to replace them upon discharge.    Participation Level:  Did Not Attend  Participation Quality:    Affect:    Cognitive:    Insight:   Engagement in Group:    Modes of Intervention:    Additional Comments:    Ardelle Anton 07/15/2023, 5:58 PM

## 2023-07-15 NOTE — ED Notes (Signed)
Pt given breakfast tray and beverage

## 2023-07-15 NOTE — ED Provider Notes (Signed)
[facility] Provider Note    Event Date/Time   First MD Initiated Contact with Patient 07/15/23 0142     (approximate)   History   Fall   HPI  Randall Buckalew. is a 62 y.o. male who presents to the ED for evaluation of Fall   I reviewed ED visit from a couple weeks ago where patient was seen for a fall and diagnosed with a small subdural, discharged after discussing with neurosurgery.  Otherwise history of chronic alcoholism.  Patient returns after another fall with left forehead trauma.  No syncope.  No other injuries.   Physical Exam   Triage Vital Signs: ED Triage Vitals  Encounter Vitals Group     BP 07/15/23 0106 135/89     Systolic BP Percentile --      Diastolic BP Percentile --      Pulse Rate 07/15/23 0106 88     Resp 07/15/23 0106 20     Temp 07/15/23 0106 98.8 F (37.1 C)     Temp Source 07/15/23 0106 Oral     SpO2 07/15/23 0106 94 %     Weight 07/15/23 0103 162 lb (73.5 kg)     Height 07/15/23 0103 5\' 7"  (1.702 m)     Head Circumference --      Peak Flow --      Pain Score 07/15/23 0103 10     Pain Loc --      Pain Education --      Exclude from Growth Chart --     Most recent vital signs: Vitals:   07/15/23 0106  BP: 135/89  Pulse: 88  Resp: 20  Temp: 98.8 F (37.1 C)  SpO2: 94%    General: Awake, no distress.  CV:  Good peripheral perfusion.  Resp:  Normal effort.  Abd:  No distention.  MSK:  No deformity noted.  Neuro:  No focal deficits appreciated. Cranial nerves II through XII intact 5/5 strength and sensation in all 4 extremities Other:     ED Results / Procedures / Treatments   Labs (all labs ordered are listed, but only abnormal results are displayed) Labs Reviewed - No data to display  EKG   RADIOLOGY CT head interpreted by me without evidence of acute intracranial pathology CT cervical spine interpreted by me without evidence of fracture or dislocation  Official radiology report(s): CT  Head Wo Contrast  Result Date: 07/15/2023 CLINICAL DATA:  Head and neck pain after falling down on sidewalk EXAM: CT HEAD WITHOUT CONTRAST CT CERVICAL SPINE WITHOUT CONTRAST TECHNIQUE: Multidetector CT imaging of the head and cervical spine was performed following the standard protocol without intravenous contrast. Multiplanar CT image reconstructions of the cervical spine were also generated. RADIATION DOSE REDUCTION: This exam was performed according to the departmental dose-optimization program which includes automated exposure control, adjustment of the mA and/or kV according to patient size and/or use of iterative reconstruction technique. COMPARISON:  06/29/2023 CT head and cervical spine FINDINGS: CT HEAD FINDINGS Brain: No evidence of acute infarct, hemorrhage, mass, mass effect, or midline shift. No hydrocephalus or extra-axial fluid collection. Previously noted trace left cerebral convexity subdural hematoma and left temporal subarachnoid hemorrhage are no longer seen. Vascular: No hyperdense vessel. Atherosclerotic calcifications in the intracranial carotid and vertebral arteries. Skull: Negative for acute fracture or focal lesion. Redemonstrated nondisplaced fracture of the squamous left temporal bone, extending superiorly to the parietal bone. Sinuses/Orbits: No acute finding. Other: The mastoid air cells  are well aerated. CT CERVICAL SPINE FINDINGS Alignment: No traumatic listhesis. Skull base and vertebrae: No acute fracture or suspicious osseous lesion. Soft tissues and spinal canal: No prevertebral fluid or swelling. No visible canal hematoma. Disc levels: Degenerative changes in the cervical spine.Mild-to-moderate spinal canal stenosis C5-C6. Upper chest: No focal pulmonary opacity or pleural effusion. IMPRESSION: 1. No acute intracranial process. Previously noted trace left cerebral convexity subdural hematoma and left temporal subarachnoid hemorrhage are no longer seen. 2. No acute fracture or  traumatic listhesis in the cervical spine. Electronically Signed   By: Wiliam Ke M.D.   On: 07/15/2023 02:20   CT Cervical Spine Wo Contrast  Result Date: 07/15/2023 CLINICAL DATA:  Head and neck pain after falling down on sidewalk EXAM: CT HEAD WITHOUT CONTRAST CT CERVICAL SPINE WITHOUT CONTRAST TECHNIQUE: Multidetector CT imaging of the head and cervical spine was performed following the standard protocol without intravenous contrast. Multiplanar CT image reconstructions of the cervical spine were also generated. RADIATION DOSE REDUCTION: This exam was performed according to the departmental dose-optimization program which includes automated exposure control, adjustment of the mA and/or kV according to patient size and/or use of iterative reconstruction technique. COMPARISON:  06/29/2023 CT head and cervical spine FINDINGS: CT HEAD FINDINGS Brain: No evidence of acute infarct, hemorrhage, mass, mass effect, or midline shift. No hydrocephalus or extra-axial fluid collection. Previously noted trace left cerebral convexity subdural hematoma and left temporal subarachnoid hemorrhage are no longer seen. Vascular: No hyperdense vessel. Atherosclerotic calcifications in the intracranial carotid and vertebral arteries. Skull: Negative for acute fracture or focal lesion. Redemonstrated nondisplaced fracture of the squamous left temporal bone, extending superiorly to the parietal bone. Sinuses/Orbits: No acute finding. Other: The mastoid air cells are well aerated. CT CERVICAL SPINE FINDINGS Alignment: No traumatic listhesis. Skull base and vertebrae: No acute fracture or suspicious osseous lesion. Soft tissues and spinal canal: No prevertebral fluid or swelling. No visible canal hematoma. Disc levels: Degenerative changes in the cervical spine.Mild-to-moderate spinal canal stenosis C5-C6. Upper chest: No focal pulmonary opacity or pleural effusion. IMPRESSION: 1. No acute intracranial process. Previously noted  trace left cerebral convexity subdural hematoma and left temporal subarachnoid hemorrhage are no longer seen. 2. No acute fracture or traumatic listhesis in the cervical spine. Electronically Signed   By: Wiliam Ke M.D.   On: 07/15/2023 02:20    PROCEDURES and INTERVENTIONS:  Procedures  Medications  butalbital-acetaminophen-caffeine (FIORICET) 50-325-40 MG per tablet 2 tablet (2 tablets Oral Given 07/15/23 0219)     IMPRESSION / MDM / ASSESSMENT AND PLAN / ED COURSE  I reviewed the triage vital signs and the nursing notes.  Differential diagnosis includes, but is not limited to, intracranial hemorrhage, skull fracture, retrobulbar hematoma, stroke, syncope  {Patient presents with symptoms of an acute illness or injury that is potentially life-threatening.  Alcoholic patient presents after a fall with a minor head injury suitable for outpatient management.  Reassuring vital signs and exam without significant signs of trauma.  No neurologic deficits.  Imaging is reassuring.  Suitable for outpatient management.      FINAL CLINICAL IMPRESSION(S) / ED DIAGNOSES   Final diagnoses:  Fall, initial encounter  Minor head injury, initial encounter     Rx / DC Orders   ED Discharge Orders     None        Note:  This document was prepared using Dragon voice recognition software and may include unintentional dictation errors.   Delton Prairie, MD 07/15/23 [phone]

## 2023-07-15 NOTE — Progress Notes (Signed)
   07/15/23 2045  Psych Admission Type (Psych Patients Only)  Admission Status Voluntary  Psychosocial Assessment  Patient Complaints Anxiety;Depression  Eye Contact Fair  Facial Expression Anxious  Affect Anxious  Speech Logical/coherent  Interaction Assertive  Motor Activity Slow;Unsteady (uses a walker on the unit)  Appearance/Hygiene In scrubs  Behavior Characteristics Cooperative;Anxious;Guarded  Mood Depressed;Anxious;Pleasant  Thought Process  Coherency WDL  Content WDL  Delusions None reported or observed  Perception WDL  Hallucination None reported or observed  Judgment Impaired  Confusion None  Danger to Self  Current suicidal ideation? Denies  Agreement Not to Harm Self Yes  Description of Agreement verbal  Danger to Others  Danger to Others None reported or observed   Progress note   D: Pt seen at nurse's station. Pt denies SI, HI, AVH. Pt rates pain  8/10 as acute HA that has been occurring for the last 2 weeks. Pt rates anxiety  8/10 and depression  9/10. Pt states he has been having episodes of dizziness for the last 2 weeks. Pt said he had a fall at that time and fractured bones in the right side of his face. Pt having trouble hearing out of his left ear. Pt educated on "call don't fall" if he feels dizzy again. Pt verbalized understanding. Pt states he has trouble sleeping but mostly its because of the pain. No other concerns noted at this time.  A: Pt provided support and encouragement. Pt given scheduled medication as prescribed. PRNs as appropriate. Q15 min checks for safety.   R: Pt safe on the unit. Will continue to monitor.

## 2023-07-15 NOTE — ED Notes (Signed)
Hospital meal provided.  100% consumed, pt tolerated w/o complaints.  Waste discarded appropriately.   

## 2023-07-15 NOTE — Progress Notes (Signed)
Pt c/o still having headache 8/10 that starts over left eye and radiates towards back of head. Pt received Tylenol at 1721. APP notified. New one-time order placed.

## 2023-07-15 NOTE — ED Provider Notes (Addendum)
[facility] Provider Note    Event Date/Time   First MD Initiated Contact with Patient 07/15/23 [phone]     (approximate)   History   Mental Health Problem   HPI  Randall Harvey. is a 62 y.o. male with history of alcohol abuse, COPD, hypertension, DVT and PE not on anticoagulation who presents to the emergency department stating that he is going to kill himself and "I want to die".  States he plans to jump out in front of traffic.  He denies HI, hallucinations.  States he has not drank alcohol in 4 to 5 days.  No drug use.  Was just seen in the emergency department after head injury.  CTs unremarkable.  Patient lives in a boardinghouse.   History provided by patient.    Past Medical History:  Diagnosis Date   Alcohol intoxication (HCC)    Anticoagulant long-term use    lifetime use, coumadin therapy, then Xarelto   Anxiety    Arthritis    Cancer (HCC)    renal   Carotid atherosclerosis    <50% left and right   Clotting disorder (HCC)    COPD (chronic obstructive pulmonary disease) (HCC)    Depression    Elevated liver enzymes 11/2013   Family history of cancer    Fatty liver    H/O drug abuse (HCC)    H/O ETOH abuse    Hepatitis    History of traumatic brain injury    Hypertension    Lymphadenopathy    Obesity    Personal history of DVT (deep vein thrombosis) 06/2013   Pre-diabetes    Primary osteoarthritis of left knee    Pulmonary embolism and infarction (HCC) 06/2013   Pulmonary embolism and infarction (HCC) 11/2013   Pulmonary embolus with infarction [facility])    Renal cell carcinoma (HCC)    Renal mass, left 06/2013   with ureteral obstruction   Stroke (HCC)    Subarachnoid hemorrhage following injury (HCC)    Thrombocythemia    Thrombocytopenia (HCC)    Tobacco abuse     Past Surgical History:  Procedure Laterality Date   ANKLE FRACTURE SURGERY Right 12/2012   ANKLE SURGERY     EXCISION METACARPAL MASS Right 09/18/2016    Procedure: EXCISION METACARPAL MASS;  Surgeon: Kennedy Bucker, MD;  Location: [facility] ORS;  Service: Orthopedics;  Laterality: Right;  5th digit    HERNIA REPAIR     Umbilical Hernia   ORBITAL FRACTURE SURGERY Left    ROBOTIC ASSITED PARTIAL NEPHRECTOMY Left Oct 2014    MEDICATIONS:  Prior to Admission medications   Medication Sig Start Date End Date Taking? Authorizing Provider  acetaminophen (TYLENOL) 500 MG tablet Take 2 tablets (1,000 mg total) by mouth every 8 (eight) hours as needed. Patient not taking: Reported on 02/21/2022 07/30/21   Jacinto Halim, PA-C  albuterol (VENTOLIN HFA) 108 (90 Base) MCG/ACT inhaler Inhale 2 puffs into the lungs every 6 (six) hours as needed for wheezing or shortness of breath. Patient not taking: Reported on 02/21/2022 09/19/21   Margarita Mail, DO  docusate sodium (COLACE) 100 MG capsule Take 1 tablet once or twice daily as needed for constipation while taking narcotic pain medicine Patient not taking: Reported on 02/21/2022 08/16/18   Loleta Rose, MD  fluticasone [facility]) 50 MCG/ACT nasal spray Place 2 sprays into both nostrils daily. Patient not taking: Reported on 02/21/2022 09/19/21   Margarita Mail, DO  folic acid (FOLVITE) 1 MG  tablet Take 1 tablet (1 mg total) by mouth daily. Patient not taking: Reported on 02/21/2022 07/31/21   Jacinto Halim, PA-C  Multiple Vitamin (MULTIVITAMIN WITH MINERALS) TABS tablet Take 1 tablet by mouth daily. Patient not taking: Reported on 02/21/2022 07/31/21   Maczis, Elmer Sow, PA-C  polyethylene glycol (MIRALAX / GLYCOLAX) 17 g packet Take 17 g by mouth daily. Patient not taking: Reported on 02/21/2022 07/31/21   Jacinto Halim, PA-C  sertraline (ZOLOFT) 25 MG tablet Take 1 tablet (25 mg total) by mouth daily. Patient not taking: Reported on 02/04/2022 09/24/21   Margarita Mail, DO  thiamine 100 MG tablet Take 1 tablet (100 mg total) by mouth daily. Patient not taking: Reported on 02/21/2022 07/31/21   Princella Pellegrini    Physical Exam   Triage Vital Signs: ED Triage Vitals  Encounter Vitals Group     BP 07/15/23 0322 (!) 144/81     Systolic BP Percentile --      Diastolic BP Percentile --      Pulse Rate 07/15/23 0322 79     Resp 07/15/23 0322 20     Temp 07/15/23 0322 98.7 F (37.1 C)     Temp Source 07/15/23 0322 Oral     SpO2 07/15/23 0322 100 %     Weight 07/15/23 0317 160 lb 15 oz (73 kg)     Height 07/15/23 0317 5\' 7"  (1.702 m)     Head Circumference --      Peak Flow --      Pain Score 07/15/23 0317 9     Pain Loc --      Pain Education --      Exclude from Growth Chart --     Most recent vital signs: Vitals:   07/15/23 0322  BP: (!) 144/81  Pulse: 79  Resp: 20  Temp: 98.7 F (37.1 C)  SpO2: 100%    CONSTITUTIONAL: Alert, responds appropriately to questions.  Chronically ill-appearing, hard of hearing HEAD: Normocephalic, atraumatic EYES: Conjunctivae clear, pupils appear equal, sclera nonicteric, left subconjunctival hemorrhage, no hyphema ENT: normal nose; moist mucous membranes NECK: Supple, normal ROM CARD: RRR; S1 and S2 appreciated RESP: Normal chest excursion without splinting or tachypnea; breath sounds clear and equal bilaterally; no wheezes, no rhonchi, no rales, no hypoxia or respiratory distress, speaking full sentences ABD/GI: Non-distended; soft, non-tender, no rebound, no guarding, no peritoneal signs BACK: The back appears normal EXT: Normal ROM in all joints; no deformity noted, no edema SKIN: Normal color for age and race; warm; no rash on exposed skin NEURO: Moves all extremities equally, normal speech, ambulates without difficulty, no facial asymmetry PSYCH: Reports SI with plan.  Does not contract for safety.  Agitated and yelling intermittently.   ED Results / Procedures / Treatments   LABS: (all labs ordered are listed, but only abnormal results are displayed) Labs Reviewed  BASIC METABOLIC PANEL - Abnormal; Notable for the  following components:      Result Value   Glucose, Bld 121 (*)    All other components within normal limits  SALICYLATE LEVEL - Abnormal; Notable for the following components:   Salicylate Lvl <7.0 (*)    All other components within normal limits  CBC  ETHANOL  ACETAMINOPHEN LEVEL  URINE DRUG SCREEN, QUALITATIVE ([facility] ONLY)     EKG:   RADIOLOGY: My personal review and interpretation of imaging: CT head and cervical spine show no acute traumatic injury.  I have personally reviewed  all radiology reports.   CT Head Wo Contrast  Result Date: 07/15/2023 CLINICAL DATA:  Head and neck pain after falling down on sidewalk EXAM: CT HEAD WITHOUT CONTRAST CT CERVICAL SPINE WITHOUT CONTRAST TECHNIQUE: Multidetector CT imaging of the head and cervical spine was performed following the standard protocol without intravenous contrast. Multiplanar CT image reconstructions of the cervical spine were also generated. RADIATION DOSE REDUCTION: This exam was performed according to the departmental dose-optimization program which includes automated exposure control, adjustment of the mA and/or kV according to patient size and/or use of iterative reconstruction technique. COMPARISON:  06/29/2023 CT head and cervical spine FINDINGS: CT HEAD FINDINGS Brain: No evidence of acute infarct, hemorrhage, mass, mass effect, or midline shift. No hydrocephalus or extra-axial fluid collection. Previously noted trace left cerebral convexity subdural hematoma and left temporal subarachnoid hemorrhage are no longer seen. Vascular: No hyperdense vessel. Atherosclerotic calcifications in the intracranial carotid and vertebral arteries. Skull: Negative for acute fracture or focal lesion. Redemonstrated nondisplaced fracture of the squamous left temporal bone, extending superiorly to the parietal bone. Sinuses/Orbits: No acute finding. Other: The mastoid air cells are well aerated. CT CERVICAL SPINE FINDINGS Alignment: No traumatic  listhesis. Skull base and vertebrae: No acute fracture or suspicious osseous lesion. Soft tissues and spinal canal: No prevertebral fluid or swelling. No visible canal hematoma. Disc levels: Degenerative changes in the cervical spine.Mild-to-moderate spinal canal stenosis C5-C6. Upper chest: No focal pulmonary opacity or pleural effusion. IMPRESSION: 1. No acute intracranial process. Previously noted trace left cerebral convexity subdural hematoma and left temporal subarachnoid hemorrhage are no longer seen. 2. No acute fracture or traumatic listhesis in the cervical spine. Electronically Signed   By: Wiliam Ke M.D.   On: 07/15/2023 02:20   CT Cervical Spine Wo Contrast  Result Date: 07/15/2023 CLINICAL DATA:  Head and neck pain after falling down on sidewalk EXAM: CT HEAD WITHOUT CONTRAST CT CERVICAL SPINE WITHOUT CONTRAST TECHNIQUE: Multidetector CT imaging of the head and cervical spine was performed following the standard protocol without intravenous contrast. Multiplanar CT image reconstructions of the cervical spine were also generated. RADIATION DOSE REDUCTION: This exam was performed according to the departmental dose-optimization program which includes automated exposure control, adjustment of the mA and/or kV according to patient size and/or use of iterative reconstruction technique. COMPARISON:  06/29/2023 CT head and cervical spine FINDINGS: CT HEAD FINDINGS Brain: No evidence of acute infarct, hemorrhage, mass, mass effect, or midline shift. No hydrocephalus or extra-axial fluid collection. Previously noted trace left cerebral convexity subdural hematoma and left temporal subarachnoid hemorrhage are no longer seen. Vascular: No hyperdense vessel. Atherosclerotic calcifications in the intracranial carotid and vertebral arteries. Skull: Negative for acute fracture or focal lesion. Redemonstrated nondisplaced fracture of the squamous left temporal bone, extending superiorly to the parietal bone.  Sinuses/Orbits: No acute finding. Other: The mastoid air cells are well aerated. CT CERVICAL SPINE FINDINGS Alignment: No traumatic listhesis. Skull base and vertebrae: No acute fracture or suspicious osseous lesion. Soft tissues and spinal canal: No prevertebral fluid or swelling. No visible canal hematoma. Disc levels: Degenerative changes in the cervical spine.Mild-to-moderate spinal canal stenosis C5-C6. Upper chest: No focal pulmonary opacity or pleural effusion. IMPRESSION: 1. No acute intracranial process. Previously noted trace left cerebral convexity subdural hematoma and left temporal subarachnoid hemorrhage are no longer seen. 2. No acute fracture or traumatic listhesis in the cervical spine. Electronically Signed   By: Wiliam Ke M.D.   On: 07/15/2023 02:20     PROCEDURES:  Critical Care performed: No   CRITICAL CARE Performed by: Rochele Raring   Total critical care time: 0 minutes  Critical care time was exclusive of separately billable procedures and treating other patients.  Critical care was necessary to treat or prevent imminent or life-threatening deterioration.  Critical care was time spent personally by me on the following activities: development of treatment plan with patient and/or surrogate as well as nursing, discussions with consultants, evaluation of patient's response to treatment, examination of patient, obtaining history from patient or surrogate, ordering and performing treatments and interventions, ordering and review of laboratory studies, ordering and review of radiographic studies, pulse oximetry and re-evaluation of patient's condition.   Procedures    IMPRESSION / MDM / ASSESSMENT AND PLAN / ED COURSE  I reviewed the triage vital signs and the nursing notes.    Patient here with suicidal thoughts with plan.    DIFFERENTIAL DIAGNOSIS (includes but not limited to):   Depression, anxiety, alcohol abuse, malingering   Patient's presentation is  most consistent with acute presentation with potential threat to life or bodily function.   PLAN: Screening labs, urine ordered from triage.  Complaining of continued headache.  Will give Tylenol.  Patient has been seen in the emergency department for similar complaints but usually in the setting of alcohol intoxication.  Will check ethanol level today.   MEDICATIONS GIVEN IN ED: Medications  acetaminophen (TYLENOL) tablet 1,000 mg (1,000 mg Oral Given 07/15/23 0353)     ED COURSE: Ethanol level is negative today.  Normal hemoglobin, electrolytes, glucose.  Tylenol level is 14.  Patient received Fioricet during recent ED visit.  I am not concerned for overdose.  Negative salicylate level.  Medically cleared at this time.  Given he is not intoxicated and still stating that he is suicidal with plan and will not contract for safety, will consult psychiatry and TTS.  Patient here voluntarily at this time.  Drug screen positive for cocaine.  Also positive for barbiturates but he did receive Fioricet in the emergency department.   CONSULTS: Psychiatry and TTS consulted for further evaluation/disposition.  Psychiatry recommends inpatient psychiatric treatment.  Patient here voluntarily.   OUTSIDE RECORDS REVIEWED: Reviewed last psychiatric notes in April 2024.       FINAL CLINICAL IMPRESSION(S) / ED DIAGNOSES   Final diagnoses:  Suicidal ideation     Rx / DC Orders   ED Discharge Orders     None        Note:  This document was prepared using Dragon voice recognition software and may include unintentional dictation errors.      Gianny Sabino, Layla Maw, DO 07/15/23 [phone]

## 2023-07-15 NOTE — ED Notes (Signed)
Handoff received from [employer]

## 2023-07-15 NOTE — ED Notes (Signed)
Pt speaking with psychiatry at this time.

## 2023-07-15 NOTE — Progress Notes (Signed)
   07/15/23 1409  Psych Admission Type (Psych Patients Only)  Admission Status Voluntary  Psychosocial Assessment  Patient Complaints Depression;Anxiety  Eye Contact Brief  Facial Expression Anxious  Affect Anxious  Speech Logical/coherent  Interaction Assertive  Motor Activity Slow;Unsteady  Appearance/Hygiene Poor hygiene;In scrubs  Mood Depressed;Anxious  Aggressive Behavior  Effect No apparent injury  Thought Process  Coherency Disorganized  Content Preoccupation  Delusions None reported or observed  Perception WDL  Hallucination None reported or observed  Judgment WDL  Confusion None  Danger to Self  Current suicidal ideation? Passive  Self-Injurious Behavior Self-injurious ideation verbalized  Agreement Not to Harm Self Yes  Description of Agreement verbal  Danger to Others  Danger to Others None reported or observed   Patient arrived on unit with nurse from the ED. Complained of pain to left side of head from previous fall. Scattered abrasions on left and right extremities and on right side of cheek bone. Unsteady gait noted and gave rolling walker for balance. Patient is HOH in left ear. Denies wearing hearing aides.  Patient requested for the influenza vaccine while on this admission and refused the PNA vaccine. He is current smoker and will prefer the nicotine patch. States he drinks alcohol twice a week about a pint each time. Last drank alcohol was yesterday prior to admission. Rates his anxiety and depression at 6/10 with passive SI to jump in front of a train. Patient states his goal during this admission is to get a lot calmer and things to be better for him. Sister is his support person.

## 2023-07-15 NOTE — BH Assessment (Signed)
Patient is to be admitted to [facility] Unit by Dr.  Marlou Porch .  Attending Physician will be Dr. Marlou Porch.   Patient has been assigned to room L33, by [facility] Charge Nurse [place].    ER staff is aware of the admission: Misty Stanley, ER Secretary   Dr. Fuller Plan, ER MD  Connye Burkitt, Patient's Nurse  Juliette Alcide, Patient Access.

## 2023-07-15 NOTE — ED Notes (Signed)
Oriented to unit and expectations of behavior, unit rules and layout. Denies further needs at this time.

## 2023-07-15 NOTE — ED Notes (Signed)
Patient provided snack at appropriate snack time.  Pt consumed 100% of snack provided, tolerated well w/o complaints

## 2023-07-15 NOTE — ED Notes (Signed)
VOL PENDING  CONSULT  MOVED  TO  [facility]  UNIT

## 2023-07-15 NOTE — Group Note (Signed)
Recreation Therapy Group Note   Group Topic:Goal Setting  Group Date: 07/15/2023 Start Time: 1400 End Time: 1500 Facilitators: Rosina Lowenstein, LRT, CTRS Location:  Dayroom  Group Description: Scientist, research (physical sciences). Patients were given many different magazines, a glue stick, markers, and a piece of cardstock paper. LRT and pts discussed the importance of having goals in life. LRT and pts discussed the difference between short-term and long-term goals, as well as what a SMART goal is. LRT encouraged pts to create a vision board, with images they picked and then cut out with safety scissors from the magazine, for themselves, that capture their short and long-term goals. LRT encouraged pts to show and explain their vision board to the group.   Goal Area(s) Addressed:  Patient will gain knowledge of short vs. long term goals.  Patient will identify goals for themselves. Patient will practice setting SMART goals. Patient will verbalize their goals to LRT and peers.   Affect/Mood: N/A   Participation Level: Did not attend    Clinical Observations/Individualized Feedback: Randall Harvey did not attend group.  Plan: Continue to engage patient in RT group sessions 2-3x/week.   Rosina Lowenstein, LRT, CTRS 07/15/2023 3:46 PM

## 2023-07-15 NOTE — ED Notes (Addendum)
Report to [facility] geropsych. Pt can arrive at 1230PM

## 2023-07-15 NOTE — ED Triage Notes (Signed)
Patient ambulatory to triage with steady gait, without difficulty or distress noted; pt was just seen and d/c to the lobby; pt now st he is having thoughts or hurting himself without a specific plan

## 2023-07-16 DIAGNOSIS — F1410 Cocaine abuse, uncomplicated: Secondary | ICD-10-CM | POA: Diagnosis not present

## 2023-07-16 DIAGNOSIS — F332 Major depressive disorder, recurrent severe without psychotic features: Secondary | ICD-10-CM

## 2023-07-16 DIAGNOSIS — I609 Nontraumatic subarachnoid hemorrhage, unspecified: Secondary | ICD-10-CM

## 2023-07-16 MED ORDER — BUSPIRONE HCL 5 MG PO TABS
5.0000 mg | ORAL_TABLET | Freq: Two times a day (BID) | ORAL | Status: DC
  Administered 2023-07-16 – 2023-07-27 (×23): 5 mg via ORAL
  Filled 2023-07-16 (×23): qty 1

## 2023-07-16 MED ORDER — ENSURE ENLIVE PO LIQD
237.0000 mL | Freq: Two times a day (BID) | ORAL | Status: DC
  Administered 2023-07-16 – 2023-07-28 (×20): 237 mL via ORAL

## 2023-07-16 MED ORDER — ADULT MULTIVITAMIN W/MINERALS CH
1.0000 | ORAL_TABLET | Freq: Every day | ORAL | Status: DC
  Administered 2023-07-16 – 2023-07-28 (×13): 1 via ORAL
  Filled 2023-07-16 (×13): qty 1

## 2023-07-16 MED ORDER — NIMODIPINE 30 MG PO CAPS
60.0000 mg | ORAL_CAPSULE | ORAL | Status: AC
  Administered 2023-07-16 – 2023-07-23 (×37): 60 mg via ORAL
  Filled 2023-07-16 (×42): qty 2

## 2023-07-16 MED ORDER — IBUPROFEN 200 MG PO TABS
600.0000 mg | ORAL_TABLET | Freq: Once | ORAL | Status: AC
  Administered 2023-07-16: 600 mg via ORAL
  Filled 2023-07-16: qty 3

## 2023-07-16 MED ORDER — MECLIZINE HCL 25 MG PO TABS
12.5000 mg | ORAL_TABLET | Freq: Three times a day (TID) | ORAL | Status: DC | PRN
  Administered 2023-07-16 – 2023-07-28 (×14): 12.5 mg via ORAL
  Filled 2023-07-16 (×16): qty 0.5

## 2023-07-16 MED ORDER — BUTALBITAL-APAP-CAFFEINE 50-325-40 MG PO TABS
1.0000 | ORAL_TABLET | Freq: Four times a day (QID) | ORAL | Status: DC | PRN
  Administered 2023-07-16 – 2023-07-28 (×22): 1 via ORAL
  Filled 2023-07-16 (×28): qty 1

## 2023-07-16 MED ORDER — MELATONIN 5 MG PO TABS
5.0000 mg | ORAL_TABLET | Freq: Every evening | ORAL | Status: DC | PRN
  Administered 2023-07-16 – 2023-07-26 (×10): 5 mg via ORAL
  Filled 2023-07-16 (×11): qty 1

## 2023-07-16 NOTE — BHH Suicide Risk Assessment (Signed)
[facility] Admission Suicide Risk Assessment     Principal Problem: MDD (major depressive disorder) Diagnosis:  Principal Problem:   MDD (major depressive disorder)  Subjective Data: Randall Harvey, Randall Harvey. is a 62 year old male with past psychiatric history of anxiety disorder, polysubstance use (EtOH, alcohol, marijuana, cocaine, barbiturates, tobacco), and alcohol induced mood disorder.  Randall Harvey initially presented to [facility] ED  after a fall, he was seen, and ultimately discharged to the ED lobby. Once in the lobby he reported thoughts of wanting to harm himself.   Continued Clinical Symptoms:    The "Alcohol Use Disorders Identification Test", Guidelines for Use in Primary Care, Second Edition.  World Science writer [facility]). Score between 0-7:  no or low risk or alcohol related problems. Score between 8-15:  moderate risk of alcohol related problems. Score between 16-19:  high risk of alcohol related problems. Score 20 or above:  warrants further diagnostic evaluation for alcohol dependence and treatment.   CLINICAL FACTORS:   Depression:   Anhedonia Hopelessness Impulsivity Insomnia Previous Psychiatric Diagnoses and Treatments   Musculoskeletal: Strength & Muscle Tone: within normal limits Gait & Station: normal Patient leans: N/A  Psychiatric Specialty Exam:  Presentation  General Appearance:  Disheveled  Eye Contact: Fair  Speech: Normal Rate  Speech Volume: Increased  Handedness: Right   Mood and Affect  Mood: Depressed  Affect: Blunt; Tearful   Thought Process  Thought Processes: Coherent  Descriptions of Associations:Intact  Orientation:Full (Time, Place and Person)  Thought Content: Paranoia  History of Schizophrenia/Schizoaffective disorder:No  Duration of Psychotic Symptoms:N/A  Hallucinations:Hallucinations: None  Ideas of Reference:None  Suicidal Thoughts:Suicidal Thoughts: Yes, Active SI Active Intent and/or Plan: With  Plan (Reports he would step in front of a train) SI Passive Intent and/or Plan: -- (States he will jump in front of train)  Homicidal Thoughts:Homicidal Thoughts: No   Sensorium  Memory: Immediate Fair; Recent Poor; Remote Poor  Judgment: Fair  Insight: Fair   Chartered certified accountant: Fair  Attention Span: Good  Recall: Poor  Fund of Knowledge: Fair  Language: Good   Psychomotor Activity  Psychomotor Activity: Psychomotor Activity: Normal   Assets  Assets: Communication Skills; Financial Resources/Insurance; Housing   Sleep  Sleep: Sleep: Fair    Physical Exam: Physical Exam Vitals and nursing note reviewed.  HENT:     Head: Normocephalic and atraumatic.  Cardiovascular:     Rate and Rhythm: Normal rate.  Pulmonary:     Effort: Pulmonary effort is normal.  Neurological:     General: No focal deficit present.     Mental Status: He is alert and oriented to person, place, and time.  Psychiatric:        Attention and Perception: He does not perceive auditory or visual hallucinations.        Mood and Affect: Mood is depressed. Affect is blunt and tearful.        Thought Content: Thought content is paranoid.        Cognition and Memory: Memory is impaired.        Judgment: Judgment is impulsive.      Review of Systems  Constitutional:  Negative for chills and fever.  HENT:  Positive for hearing loss.   Eyes:  Negative for blurred vision.  Respiratory:  Negative for shortness of breath.   Cardiovascular:  Negative for chest pain and palpitations.  Gastrointestinal:  Negative for nausea.  Neurological:  Positive for headaches.  Psychiatric/Behavioral:  Positive for suicidal ideas.  Blood pressure 106/86, pulse 69, temperature 97.9 F (36.6 C), resp. rate 18, height 5\' 7"  (1.702 m), weight 73.7 kg, SpO2 99%. Body mass index is 25.45 kg/m.   COGNITIVE FEATURES THAT CONTRIBUTE TO RISK:  Polarized thinking and Thought constriction  (tunnel vision)    SUICIDE RISK:   Moderate:  Frequent suicidal ideation with limited intensity, and duration, some specificity in terms of plans, no associated intent, good self-control  PLAN OF CARE: Per H&P  I certify that inpatient services furnished can reasonably be expected to improve the patient's condition.   Lewanda Rife, MD

## 2023-07-16 NOTE — Plan of Care (Signed)
Problem: Safety: Goal: Ability to remain free from injury will improve Outcome: Progressing   Problem: Pain Managment: Goal: General experience of comfort will improve Outcome: Progressing

## 2023-07-16 NOTE — Progress Notes (Signed)
Patient pleasant and cooperative. Endorses on 9/10 pain in head, dizziness and feeling light headed and off balance.  Passive SI.  Contracts for safety on the unit. Endorses anxiety and depression.  Denies HI and AVH.    Compliant with scheduled medications.  15 min checks in place for safety.  Patient isolated to room due to pain/ not feeling well this morning.  Present in the milieu this afternoon.  Appropriate interaction with peers and staff.  Hospitalist consulted with patient.  New orders placed for scheduled and PRN medications.  Medications given as ordered.

## 2023-07-16 NOTE — Group Note (Signed)
Date:  07/16/2023 Time:  5:01 AM  Group Topic/Focus:  Coping With Mental Health Crisis:   The purpose of this group is to help patients identify strategies for coping with mental health crisis.  Group discusses possible causes of crisis and ways to manage them effectively. Wrap-Up Group:   The focus of this group is to help patients review their daily goal of treatment and discuss progress on daily workbooks.    Participation Level:  Did Not Attend   Additional Comments:    Osker Mason 07/16/2023, 5:01 AM
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
## 2023-07-16 NOTE — Consult Note (Signed)
Initial Consultation Note   Patient: Randall Harvey. ZOX:[id number] DOB: 1961-08-12 PCP: Margarita Mail, DO DOA: 07/15/2023 DOS: the patient was seen and examined on 07/16/2023 Primary service: Sarina Ill  Referring physician: Dr. Marval Regal Reason for consult: Headache and dizzyness  Assessment/Plan:  Acute/subacute headache -Probably related to concussion, however clinically cannot rule out [facility] related vasospasm induced headache.  Repeat CT head yesterday showed resolution of the previous subdural hemorrhage and the [facility].  SAH happened 15 days ago, still with in the window for vasospasm prophylaxis/treatment window of total 21 days, will start nimodipine every 6 hours x 7 days then reevaluate. -Other possibility is concussion related headache, will try Fioricet as needed -Continue ibuprofen as needed  Recent subdural hematoma and subarachnoid hemorrhage -Secondary to fall and concussion, CT head today showed resolving subdural hematoma and SAH.  Headache management as above  BPV -Likely secondary to concussion, CT on 09/30 showed chronic/unchanged nondisplaced left temporal and calvarial bone fracture. -Trial of meclizine as needed  Anxiety/depression -As above   TRH will continue to follow the patient.  HPI: Randall Harvey. is a 62 y.o. male with past medical history of bilateral knee severe OA and anxiety/depression was brought in last night for suicidal ideation and severe depression.  Patient sustained a fall on 9/30 and came to ED.  Workup and brain image of CT scan showed acute left subdural hematoma and small volume of acute subarachnoid hemorrhage overlying the left temporal lobe.  Patient was evaluated by neurosurgery and reassured and sent home and ask to follow-up with neurosurgery in 1 to 3 weeks.  Patient reported that since the discharge he gradually started to have a left-sided headache, sharp-like, " across from left-sided forehead to the back of the  head" patient has tried naproxen and aspirin with some help.  Denies any vision problems, denied any nauseous vomiting.  Patient also reported that for the last few months he has been experienced frequent episode of lightheadedness, usually associated with body position changes such as bending down or standing up, then he started to feel severe lightheadedness and sometimes had to sit down to avoid fall.  He also reported that the hearing on the left side ear is significantly decreased recently.  He claimed that he is homeless and has not been able to contact his PCP for a while  Review of Systems: As mentioned in the history of present illness. All other systems reviewed and are negative. Past Medical History:  Diagnosis Date   Alcohol intoxication (HCC)    Anticoagulant long-term use    lifetime use, coumadin therapy, then Xarelto   Anxiety    Arthritis    Cancer (HCC)    renal   Carotid atherosclerosis    <50% left and right   Clotting disorder (HCC)    COPD (chronic obstructive pulmonary disease) (HCC)    Depression    Elevated liver enzymes 11/2013   Family history of cancer    Fatty liver    H/O drug abuse (HCC)    H/O ETOH abuse    Hepatitis    History of traumatic brain injury    Hypertension    Lymphadenopathy    Obesity    Personal history of DVT (deep vein thrombosis) 06/2013   Pre-diabetes    Primary osteoarthritis of left knee    Pulmonary embolism and infarction (HCC) 06/2013   Pulmonary embolism and infarction (HCC) 11/2013   Pulmonary embolus with infarction [facility])    Renal cell  carcinoma [facility])    Renal mass, left 06/2013   with ureteral obstruction   Stroke (HCC)    Subarachnoid hemorrhage following injury (HCC)    Thrombocythemia    Thrombocytopenia (HCC)    Tobacco abuse    Past Surgical History:  Procedure Laterality Date   ANKLE FRACTURE SURGERY Right 12/2012   ANKLE SURGERY     EXCISION METACARPAL MASS Right 09/18/2016   Procedure: EXCISION  METACARPAL MASS;  Surgeon: Kennedy Bucker, MD;  Location: [facility] ORS;  Service: Orthopedics;  Laterality: Right;  5th digit    HERNIA REPAIR     Umbilical Hernia   ORBITAL FRACTURE SURGERY Left    ROBOTIC ASSITED PARTIAL NEPHRECTOMY Left Oct 2014   Social History:  reports that he has been smoking cigarettes. He has never used smokeless tobacco. He reports current alcohol use. He reports current drug use. Drug: Marijuana.  No Known Allergies  Family History  Problem Relation Age of Onset   Pancreatic cancer Mother    Colon cancer Father    Diabetes Father    Colon cancer Sister    COPD Sister    Seizures Sister    Ovarian cancer Sister    Cancer Paternal Grandmother        bone   Stroke Paternal Grandfather    Heart disease Paternal Grandfather    Hypertension Neg Hx     Prior to Admission medications   Medication Sig Start Date End Date Taking? Authorizing Provider  acetaminophen (TYLENOL) 500 MG tablet Take 2 tablets (1,000 mg total) by mouth every 8 (eight) hours as needed. Patient not taking: Reported on 02/21/2022 07/30/21   Jacinto Halim, PA-C  albuterol (VENTOLIN HFA) 108 (90 Base) MCG/ACT inhaler Inhale 2 puffs into the lungs every 6 (six) hours as needed for wheezing or shortness of breath. Patient not taking: Reported on 02/21/2022 09/19/21   Margarita Mail, DO  docusate sodium (COLACE) 100 MG capsule Take 1 tablet once or twice daily as needed for constipation while taking narcotic pain medicine Patient not taking: Reported on 02/21/2022 08/16/18   Loleta Rose, MD  fluticasone [facility]) 50 MCG/ACT nasal spray Place 2 sprays into both nostrils daily. Patient not taking: Reported on 02/21/2022 09/19/21   Margarita Mail, DO  folic acid (FOLVITE) 1 MG tablet Take 1 tablet (1 mg total) by mouth daily. Patient not taking: Reported on 02/21/2022 07/31/21   Jacinto Halim, PA-C  Multiple Vitamin (MULTIVITAMIN WITH MINERALS) TABS tablet Take 1 tablet by mouth  daily. Patient not taking: Reported on 02/21/2022 07/31/21   Maczis, Elmer Sow, PA-C  polyethylene glycol (MIRALAX / GLYCOLAX) 17 g packet Take 17 g by mouth daily. Patient not taking: Reported on 02/21/2022 07/31/21   Jacinto Halim, PA-C  sertraline (ZOLOFT) 25 MG tablet Take 1 tablet (25 mg total) by mouth daily. Patient not taking: Reported on 02/04/2022 09/24/21   Margarita Mail, DO  thiamine 100 MG tablet Take 1 tablet (100 mg total) by mouth daily. Patient not taking: Reported on 02/21/2022 07/31/21   Princella Pellegrini    Physical Exam: Vitals:   07/15/23 1314 07/15/23 1350 07/15/23 2031 07/16/23 0719  BP: (!) 145/85 (!) 145/85 (!) 159/88 106/86  Pulse: 72 (!) 103 71 69  Resp: 20 20  18   Temp: 98.6 F (37 C) 98.6 F (37 C) 97.9 F (36.6 C) 97.9 F (36.6 C)  TempSrc:  Temporal    SpO2: 98% 98% 98% 99%  Weight:  73.7 kg  Height:  5\' 7"  (1.702 m)     Subjective:  Eyes: PERRL, lids and conjunctivae normal ENMT: Mucous membranes are moist. Posterior pharynx clear of any exudate or lesions.Normal dentition.  Neck: normal, supple, no masses, no thyromegaly Respiratory: clear to auscultation bilaterally, no wheezing, no crackles. Normal respiratory effort. No accessory muscle use.  Cardiovascular: Regular rate and rhythm, no murmurs / rubs / gallops. No extremity edema. 2+ pedal pulses. No carotid bruits.  Abdomen: no tenderness, no masses palpated. No hepatosplenomegaly. Bowel sounds positive.  Musculoskeletal: no clubbing / cyanosis. No joint deformity upper and lower extremities. Good ROM, no contractures. Normal muscle tone.  Skin: no rashes, lesions, ulcers. No induration Neurologic: CN 2-12 grossly intact. Sensation intact, DTR normal.  Slight weakness on left side compared to right side Psychiatric: Normal judgment and insight. Alert and oriented x 3. Normal mood.   Data Reviewed:  Brain image CT head 9/30 and yesterday  Family Communication: Patient claims that  he does not have any family member Primary team communication: Psychiatry team Thank you very much for involving [place] in the care of your patient.  Author: Emeline General, MD 07/16/2023 2:05 PM  For on call review [email or web address].

## 2023-07-16 NOTE — H&P (Addendum)
Psychiatric Admission Assessment Adult  Patient Identification: Randall Harvey. MRN:  [id number] Date of Evaluation:  07/16/2023 Chief Complaint:  MDD (major depressive disorder) [F32.9] Principal Diagnosis: MDD (major depressive disorder) Diagnosis:  Principal Problem:   MDD (major depressive disorder)  History of Present Illness: Randall Harvey. is a 62 year old male with past psychiatric history of anxiety disorder, polysubstance use (EtOH, alcohol, marijuana, cocaine, barbiturates, tobacco), and alcohol induced mood disorder.  Randall Harvey initially presented to [facility] regional ED  after a fall, he was seen, and ultimately discharged to the ED lobby. Once in the lobby he reported thoughts of wanting to harm himself.  Chart reviewed, case discussed in multidisciplinary treatment team meeting today, patient seen during rounds.  Patient endorses depressed mood, anhedonia, poor sleep, and poor appetite.  Patient said that he has been feeling depressed for the past 2 weeks or so after breaking up with his girlfriend.  Patient reports at times he feels hopeless and helpless.  Patient reports that he had times feels like jumping in front of a train.  Patient was provided with support and reassurance.  Patient denies any intention to harm himself on the unit.  Patient denies auditory visual hallucination or homicidal thoughts.  Patient denies manic symptoms at present or in the past.  Patient reports that he has been "bad headache".  He also at times feels dizzy.  Patient was informed that hospitalist has been consulted to follow-up on medical needs.  Patient was encouraged to attend group and work on coping strategies.  Past Psychiatric History: Patient reports remote history of depression.  Patient reports he is not treatment for depression.  He denies past history of inpatient psychiatric treatment.  Is the patient at risk to self? Yes.    Has the patient been a risk to self in the past 6 months? No.   Has the patient been a risk to self within the distant past? No.  Is the patient a risk to others? No.  Has the patient been a risk to others in the past 6 months? No.  Has the patient been a risk to others within the distant past? No.   [place] Scale:  Flowsheet Row ED from 07/15/2023 in [facility] Department at [facility] ED from 06/29/2023 in [facility] at [facility] ED from 02/19/2023 in [facility] Emergency Department at [facility]  C-SSRS RISK CATEGORY No Risk No Risk No Risk        Prior Inpatient Therapy: No.  Prior Outpatient Therapy: Yes.      Alcohol Screening:   Substance Abuse History in the last 12 months:  Yes.   Patient has history of alcohol use, patient with BAL on 01/13/2023 was  250, although BAL on 07/15/2023 is less than 7 Patient's UDS positive for barbiturates and cocaine  Previous Psychotropic Medications: Yes   Past Medical History:  Past Medical History:  Diagnosis Date   Alcohol intoxication (HCC)    Anticoagulant long-term use    lifetime use, coumadin therapy, then Xarelto   Anxiety    Arthritis    Cancer (HCC)    renal   Carotid atherosclerosis    <50% left and right   Clotting disorder (HCC)    COPD (chronic obstructive pulmonary disease) (HCC)    Depression    Elevated liver enzymes 11/2013   Family history of cancer    Fatty liver    H/O drug abuse (HCC)    H/O ETOH  abuse    Hepatitis    History of traumatic brain injury    Hypertension    Lymphadenopathy    Obesity    Personal history of DVT (deep vein thrombosis) 06/2013   Pre-diabetes    Primary osteoarthritis of left knee    Pulmonary embolism and infarction (HCC) 06/2013   Pulmonary embolism and infarction (HCC) 11/2013   Pulmonary embolus with infarction [facility])    Renal cell carcinoma (HCC)    Renal mass, left 06/2013   with ureteral obstruction   Stroke (HCC)    Subarachnoid hemorrhage following injury (HCC)     Thrombocythemia    Thrombocytopenia (HCC)    Tobacco abuse     Past Surgical History:  Procedure Laterality Date   ANKLE FRACTURE SURGERY Right 12/2012   ANKLE SURGERY     EXCISION METACARPAL MASS Right 09/18/2016   Procedure: EXCISION METACARPAL MASS;  Surgeon: Kennedy Bucker, MD;  Location: [facility] ORS;  Service: Orthopedics;  Laterality: Right;  5th digit    HERNIA REPAIR     Umbilical Hernia   ORBITAL FRACTURE SURGERY Left    ROBOTIC ASSITED PARTIAL NEPHRECTOMY Left Oct 2014   Family History:  Family History  Problem Relation Age of Onset   Pancreatic cancer Mother    Colon cancer Father    Diabetes Father    Colon cancer Sister    COPD Sister    Seizures Sister    Ovarian cancer Sister    Cancer Paternal Grandmother        bone   Stroke Paternal Grandfather    Heart disease Paternal Grandfather    Hypertension Neg Hx     Tobacco Screening:  Social History   Tobacco Use  Smoking Status Every Day   Current packs/day: 0.50   Types: Cigarettes  Smokeless Tobacco Never    BH Tobacco Counseling     Are you interested in Tobacco Cessation Medications?  Yes, implement Nicotene Replacement Protocol Counseled patient on smoking cessation:  Refused/Declined practical counseling Reason Tobacco Screening Not Completed: No value filed.       Social History:  Social History   Substance and Sexual Activity  Alcohol Use Yes   Comment: occasionally     Social History   Substance and Sexual Activity  Drug Use Yes   Types: Marijuana   Comment: occ    Additional Social History:                           Allergies:  No Known Allergies Lab Results:  Results for orders placed or performed during the hospital encounter of 07/15/23 (from the past 48 hour(s))  CBC     Status: None   Collection Time: 07/15/23  3:23 AM  Result Value Ref Range   WBC 9.0 4.0 - 10.5 K/uL   RBC 4.66 4.22 - 5.81 MIL/uL   Hemoglobin 15.0 13.0 - 17.0 g/dL   HCT 16.1 09.6 - 04.5 %    MCV 96.6 80.0 - 100.0 fL   MCH 32.2 26.0 - 34.0 pg   MCHC 33.3 30.0 - 36.0 g/dL   RDW 40.9 81.1 - 91.4 %   Platelets 203 150 - 400 K/uL   nRBC 0.0 0.0 - 0.2 %    Comment: Performed at [facility], [address] [place], [place] [address]  Basic metabolic panel     Status: Abnormal   Collection Time: 07/15/23  3:23 AM  Result Value Ref Range  Sodium 135 135 - 145 mmol/L   Potassium 4.9 3.5 - 5.1 mmol/L   Chloride 98 98 - 111 mmol/L   CO2 27 22 - 32 mmol/L   Glucose, Bld 121 (H) 70 - 99 mg/dL    Comment: Glucose reference range applies only to samples taken after fasting for at least 8 hours.   BUN 12 8 - 23 mg/dL   Creatinine, Ser 1.02 0.61 - 1.24 mg/dL   Calcium 9.1 8.9 - 72.5 mg/dL   GFR, Estimated >36 >64 mL/min    Comment: (NOTE) Calculated using the CKD-EPI Creatinine Equation (2021)    Anion gap 10 5 - 15    Comment: Performed at [facility], [address] [place], [place] [address]  Ethanol     Status: None   Collection Time: 07/15/23  3:23 AM  Result Value Ref Range   Alcohol, Ethyl (B) <10 <10 mg/dL    Comment: (NOTE) Lowest detectable limit for serum alcohol is 10 mg/dL.  For medical purposes only. Performed at [facility], [address] [place], [place] [address]   Acetaminophen level     Status: None   Collection Time: 07/15/23  3:23 AM  Result Value Ref Range   Acetaminophen (Tylenol), Serum 14 10 - 30 ug/mL    Comment: (NOTE) Therapeutic concentrations vary significantly. A range of 10-30 ug/mL  may be an effective concentration for many patients. However, some  are best treated at concentrations outside of this range. Acetaminophen concentrations >150 ug/mL at 4 hours after ingestion  and >50 ug/mL at 12 hours after ingestion are often associated with  toxic reactions.  Performed at [facility], [address] [place], [place] [address]   Salicylate level     Status: Abnormal   Collection Time:  07/15/23  3:23 AM  Result Value Ref Range   Salicylate Lvl <7.0 (L) 7.0 - 30.0 mg/dL    Comment: Performed at [facility], [address], [place], [place] [address]  Urine Drug Screen, Qualitative ([facility] only)     Status: Abnormal   Collection Time: 07/15/23  5:20 AM  Result Value Ref Range   Tricyclic, Ur Screen NONE DETECTED NONE DETECTED   Amphetamines, Ur Screen NONE DETECTED NONE DETECTED   MDMA (Ecstasy)Ur Screen NONE DETECTED NONE DETECTED   Cocaine Metabolite,Ur [place] POSITIVE (A) NONE DETECTED   Opiate, Ur Screen NONE DETECTED NONE DETECTED   Phencyclidine (PCP) Ur S NONE DETECTED NONE DETECTED   Cannabinoid 50 Ng, Ur [place] NONE DETECTED NONE DETECTED   Barbiturates, Ur Screen POSITIVE (A) NONE DETECTED   Benzodiazepine, Ur Scrn NONE DETECTED NONE DETECTED   Methadone Scn, Ur NONE DETECTED NONE DETECTED    Comment: (NOTE) Tricyclics + metabolites, urine    Cutoff 1000 ng/mL Amphetamines + metabolites, urine  Cutoff 1000 ng/mL MDMA (Ecstasy), urine              Cutoff 500 ng/mL Cocaine Metabolite, urine          Cutoff 300 ng/mL Opiate + metabolites, urine        Cutoff 300 ng/mL Phencyclidine (PCP), urine         Cutoff 25 ng/mL Cannabinoid, urine                 Cutoff 50 ng/mL Barbiturates + metabolites, urine  Cutoff 200 ng/mL Benzodiazepine, urine              Cutoff 200 ng/mL Methadone, urine  Cutoff 300 ng/mL  The urine drug screen provides only a preliminary, unconfirmed analytical test result and should not be used for non-medical purposes. Clinical consideration and professional judgment should be applied to any positive drug screen result due to possible interfering substances. A more specific alternate chemical method must be used in order to obtain a confirmed analytical result. Gas chromatography / mass spectrometry (GC/MS) is the preferred confirm atory method. Performed at [facility], [address] [place], [place]  [address]   SARS Coronavirus 2 by RT PCR (hospital order, performed in [facility] hospital lab) *cepheid single result test* Anterior Nasal Swab     Status: None   Collection Time: 07/15/23 10:11 AM   Specimen: Anterior Nasal Swab  Result Value Ref Range   SARS Coronavirus 2 by RT PCR NEGATIVE NEGATIVE    Comment: (NOTE) SARS-CoV-2 target nucleic acids are NOT DETECTED.  The SARS-CoV-2 RNA is generally detectable in upper and lower respiratory specimens during the acute phase of infection. The lowest concentration of SARS-CoV-2 viral copies this assay can detect is 250 copies / mL. A negative result does not preclude SARS-CoV-2 infection and should not be used as the sole basis for treatment or other patient management decisions.  A negative result may occur with improper specimen collection / handling, submission of specimen other than nasopharyngeal swab, presence of viral mutation(s) within the areas targeted by this assay, and inadequate number of viral copies (<250 copies / mL). A negative result must be combined with clinical observations, patient history, and epidemiological information.  Fact Sheet for Patients:   RoadLapTop.co.za  Fact Sheet for Healthcare Providers: http://kim-miller.com/  This test is not yet approved or  cleared by the [place] FDA and has been authorized for detection and/or diagnosis of SARS-CoV-2 by FDA under an Emergency Use Authorization (EUA).  This EUA will remain in effect (meaning this test can be used) for the duration of the COVID-19 declaration under Section 564(b)(1) of the Act, 21 U.S.C. section 360bbb-3(b)(1), unless the authorization is terminated or revoked sooner.  Performed at [facility], [address] [place], [place] [address]     Blood Alcohol level:  Lab Results  Component Value Date   ETH <10 07/15/2023   ETH 250 (H) 01/13/2023    Metabolic Disorder Labs:   Lab Results  Component Value Date   HGBA1C 5.6 08/20/2021   MPG 114 08/20/2021   MPG 146 09/27/2018   No results found for: "PROLACTIN" Lab Results  Component Value Date   CHOL 169 09/19/2021   TRIG 307 (H) 09/19/2021   HDL 49 09/19/2021   CHOLHDL 3.4 09/19/2021   VLDL NOT CALC 08/07/2016   LDLCALC 83 09/19/2021   LDLCALC 98 09/27/2018    Current Medications: Current Facility-Administered Medications  Medication Dose Route Frequency Provider Last Rate Last Admin   acetaminophen (TYLENOL) tablet 650 mg  650 mg Oral Q6H PRN Lauree Chandler, NP   650 mg at 07/16/23 0728   alum & mag hydroxide-simeth (MAALOX/MYLANTA) 200-200-20 MG/5ML suspension 30 mL  30 mL Oral Q4H PRN Lauree Chandler, NP       diphenhydrAMINE (BENADRYL) capsule 50 mg  50 mg Oral TID PRN Lauree Chandler, NP       Or   diphenhydrAMINE (BENADRYL) injection 50 mg  50 mg Intramuscular TID PRN Lauree Chandler, NP       docusate sodium (COLACE) capsule 100 mg  100 mg Oral BID Lewanda Rife, MD  100 mg at 07/15/23 2110   feeding supplement (ENSURE ENLIVE / ENSURE PLUS) liquid 237 mL  237 mL Oral BID BM Sarina Ill, DO       haloperidol (HALDOL) tablet 5 mg  5 mg Oral TID PRN Lauree Chandler, NP       Or   haloperidol lactate (HALDOL) injection 5 mg  5 mg Intramuscular TID PRN Lauree Chandler, NP       hydrOXYzine (ATARAX) tablet 25 mg  25 mg Oral TID PRN Lauree Chandler, NP   25 mg at 07/15/23 1721   influenza vac split trivalent PF (FLULAVAL) injection 0.5 mL  0.5 mL Intramuscular Tomorrow-1000 Lewanda Rife, MD       LORazepam (ATIVAN) tablet 2 mg  2 mg Oral TID PRN Lauree Chandler, NP       Or   LORazepam (ATIVAN) injection 2 mg  2 mg Intramuscular TID PRN Lauree Chandler, NP       magnesium hydroxide (MILK OF MAGNESIA) suspension 30 mL  30 mL Oral Daily PRN Lauree Chandler, NP       multivitamin with minerals tablet 1 tablet  1 tablet Oral Daily Sarina Ill, DO       nicotine (NICODERM CQ - dosed in mg/24 hours) patch 14 mg  14 mg Transdermal Daily Lewanda Rife, MD       sertraline (ZOLOFT) tablet 25 mg  25 mg Oral Daily Lauree Chandler, NP       traZODone (DESYREL) tablet 50 mg  50 mg Oral QHS PRN Lauree Chandler, NP   50 mg at 07/15/23 2110   PTA Medications: Medications Prior to Admission  Medication Sig Dispense Refill Last Dose   acetaminophen (TYLENOL) 500 MG tablet Take 2 tablets (1,000 mg total) by mouth every 8 (eight) hours as needed. (Patient not taking: Reported on 02/21/2022) 30 tablet 0    albuterol (VENTOLIN HFA) 108 (90 Base) MCG/ACT inhaler Inhale 2 puffs into the lungs every 6 (six) hours as needed for wheezing or shortness of breath. (Patient not taking: Reported on 02/21/2022) 8 g 0    docusate sodium (COLACE) 100 MG capsule Take 1 tablet once or twice daily as needed for constipation while taking narcotic pain medicine (Patient not taking: Reported on 02/21/2022) 30 capsule 0    fluticasone (FLONASE) 50 MCG/ACT nasal spray Place 2 sprays into both nostrils daily. (Patient not taking: Reported on 02/21/2022) 16 g 6    folic acid (FOLVITE) 1 MG tablet Take 1 tablet (1 mg total) by mouth daily. (Patient not taking: Reported on 02/21/2022) 30 tablet 0    Multiple Vitamin (MULTIVITAMIN WITH MINERALS) TABS tablet Take 1 tablet by mouth daily. (Patient not taking: Reported on 02/21/2022) 30 tablet 0    polyethylene glycol (MIRALAX / GLYCOLAX) 17 g packet Take 17 g by mouth daily. (Patient not taking: Reported on 02/21/2022) 14 each 0    sertraline (ZOLOFT) 25 MG tablet Take 1 tablet (25 mg total) by mouth daily. (Patient not taking: Reported on 02/04/2022) 30 tablet 3    thiamine 100 MG tablet Take 1 tablet (100 mg total) by mouth daily. (Patient not taking: Reported on 02/21/2022) 30 tablet 0     Musculoskeletal: Strength & Muscle Tone: within normal limits Gait & Station: normal Patient leans: N/A   Psychiatric  Specialty Exam:   Presentation  General Appearance:  Disheveled   Eye Contact: Fair   Speech: Normal Rate  Speech Volume: Increased   Handedness: Right     Mood and Affect  Mood: Depressed   Affect: Blunt; Tearful     Thought Process  Thought Processes: Coherent   Descriptions of Associations:Intact   Orientation:Full (Time, Place and Person)   Thought Content: Paranoia   History of Schizophrenia/Schizoaffective disorder:No   Duration of Psychotic Symptoms:N/A   Hallucinations:Hallucinations: None   Ideas of Reference:None   Suicidal Thoughts:Suicidal Thoughts: Yes, Active SI Active Intent and/or Plan: With Plan (Reports he would step in front of a train) SI Passive Intent and/or Plan: -- (States he will jump in front of train)   Homicidal Thoughts:Homicidal Thoughts: No     Sensorium  Memory: Immediate Fair; Recent Poor; Remote Poor   Judgment: Fair   Insight: Fair     Chartered certified accountant: Fair   Attention Span: Good   Recall: Poor   Fund of Knowledge: Fair   Language: Good     Psychomotor Activity  Psychomotor Activity: Psychomotor Activity: Normal     Assets  Assets: Communication Skills; Financial Resources/Insurance; Housing     Sleep  Sleep: Sleep: Fair       Physical Exam: Physical Exam Vitals and nursing note reviewed.  HENT:     Head: Normocephalic and atraumatic.  Cardiovascular:     Rate and Rhythm: Normal rate.  Pulmonary:     Effort: Pulmonary effort is normal.  Neurological:     General: No focal deficit present.     Mental Status: He is alert and oriented to person, place, and time.  Psychiatric:        Attention and Perception: He does not perceive auditory or visual hallucinations.        Mood and Affect: Mood is depressed. Affect is blunt and tearful.        Thought Content: Thought content is paranoid.        Cognition and Memory: Memory is impaired.        Judgment:  Judgment is impulsive.      Review of Systems  Constitutional:  Negative for chills and fever.  HENT:  Positive for hearing loss.   Eyes:  Negative for blurred vision.  Respiratory:  Negative for shortness of breath.   Cardiovascular:  Negative for chest pain and palpitations.  Gastrointestinal:  Negative for nausea.  Neurological:  Positive for headaches.  Psychiatric/Behavioral:  Positive for suicidal ideas.    ROS Blood pressure 106/86, pulse 69, temperature 97.9 F (36.6 C), resp. rate 18, height 5\' 7"  (1.702 m), weight 73.7 kg, SpO2 99%. Body mass index is 25.45 kg/m.  Treatment Plan Summary: Daily contact with patient to assess and evaluate symptoms and progress in treatment and Medication management  Observation Level/Precautions:  15 minute checks  Laboratory:  CBC Chemistry Profile HbAIC UDS  Psychotherapy:    Medications:    Consultations:    Discharge Concerns:    Estimated LOS:  Other:     Physician Treatment Plan for Primary Diagnosis: MDD (major depressive disorder) Long Term Goal(s): Improvement in symptoms so as ready for discharge  Short Term Goals: Ability to identify changes in lifestyle to reduce recurrence of condition will improve, Ability to verbalize feelings will improve, Ability to disclose and discuss suicidal ideas, Ability to demonstrate self-control will improve, Ability to identify and develop effective coping behaviors will improve, Ability to maintain clinical measurements within normal limits will improve, Compliance with prescribed medications will improve, and Ability to identify triggers associated with substance abuse/mental health  issues will improve  Patient is admitted to locked unit under safety precautions Patient agrees to try BuSpar for anxiety and Zoloft for depression.  Side effect of both medicines including GI effects and dizziness discussed with the patient. Will start on Zoloft 25 mg by mouth daily We will start on BuSpar 5  mg by mouth daily Patient was encouraged to attend group and work on a safe discharge plan Patient was encouraged to abstain from illicit drugs and alcohol Will consult social worker to help with a safe discharge plan.   I certify that inpatient services furnished can reasonably be expected to improve the patient's condition.    Lewanda Rife, MD Will discontinue Trazodone due to dizziness and fall risk

## 2023-07-16 NOTE — Group Note (Signed)
Date:  07/16/2023 Time:  9:07 PM  Group Topic/Focus:  Developing a Wellness Toolbox:   The focus of this group is to help patients develop a "wellness toolbox" with skills and strategies to promote recovery upon discharge.    Participation Level:  Active  Participation Quality:  Appropriate  Affect:  Appropriate  Cognitive:  Appropriate  Insight: Appropriate  Engagement in Group:  Engaged  Modes of Intervention:  Education  Additional Comments:    Garry Heater 07/16/2023, 9:07 PM

## 2023-07-16 NOTE — [facility] Counselor (Signed)
CSW unable to complete assessment with pt due to extreme pain in head. CSW will attempt assessment again tomorrow.   Reynaldo Minium, MSW, [place] 07/16/2023 4:36 PM

## 2023-07-16 NOTE — Progress Notes (Signed)
Patient complaining of 9/10 pain in head, dizziness and feeling lightheaded.  PRN Tylenol given for pain.  Dr. Marval Regal made aware.

## 2023-07-16 NOTE — Group Note (Signed)
LCSW Group Therapy Note  Group Date: 07/16/2023 Start Time: 1400 End Time: 1500   Type of Therapy and Topic:  Group Therapy - Healthy vs Unhealthy Coping Skills  Participation Level:  Did Not Attend   Description of Group The focus of this group was to determine what unhealthy coping techniques typically are used by group members and what healthy coping techniques would be helpful in coping with various problems. Patients were guided in becoming aware of the differences between healthy and unhealthy coping techniques. Patients were asked to identify 2-3 healthy coping skills they would like to learn to use more effectively.  Therapeutic Goals Patients learned that coping is what human beings do all day long to deal with various situations in their lives Patients defined and discussed healthy vs unhealthy coping techniques Patients identified their preferred coping techniques and identified whether these were healthy or unhealthy Patients determined 2-3 healthy coping skills they would like to become more familiar with and use more often. Patients provided support and ideas to each other   Summary of Patient Progress:  X   Therapeutic Modalities Cognitive Behavioral Therapy Motivational Interviewing  Elza Rafter, [place] 07/16/2023  4:44 PM

## 2023-07-16 NOTE — Plan of Care (Signed)
  Problem: Education: Goal: Knowledge of General Education information will improve Description: Including pain rating scale, medication(s)/side effects and non-pharmacologic comfort measures Outcome: Progressing   Problem: Nutrition: Goal: Adequate nutrition will be maintained Outcome: Progressing   Problem: Activity: Goal: Risk for activity intolerance will decrease Outcome: Not Progressing   Problem: Coping: Goal: Level of anxiety will decrease Outcome: Not Progressing

## 2023-07-17 DIAGNOSIS — F332 Major depressive disorder, recurrent severe without psychotic features: Secondary | ICD-10-CM | POA: Diagnosis not present

## 2023-07-17 DIAGNOSIS — R519 Headache, unspecified: Secondary | ICD-10-CM

## 2023-07-17 DIAGNOSIS — F1410 Cocaine abuse, uncomplicated: Secondary | ICD-10-CM | POA: Diagnosis not present

## 2023-07-17 NOTE — Progress Notes (Signed)
Progress Note    Randall Harvey.  ZOX:[id number] DOB: 08-07-1961  DOA: 07/15/2023 PCP: Margarita Mail, DO      Brief Narrative:    Medical records reviewed and are as summarized below:  Randall Harvey. is a 62 y.o. male with medical history significant for bilateral knee osteoarthritis, alcohol use disorder, severe depression, anxiety, homeless nurse, who was brought to the hospital by [facility] Department on 07/15/2023 for evaluation of a fall.  He was admitted to behavioral health unit because of major depressive disorder.  Of note, patient was seen in the ED on 06/29/2023 for fall and workup at that time revealed acute left cerebral subdural hematoma measuring up to 3 mm and small volume acute subarachnoid hemorrhage over the left temporal bone.  He was evaluated by the neurosurgeon on that visit and outpatient follow-up was recommended.  The hospitalist team was consulted on 07/16/2023 for evaluation of headache and dizziness.      Assessment/Plan:   Principal Problem:   MDD (major depressive disorder) Active Problems:   MDD (major depressive disorder), recurrent severe, without psychosis (HCC)   Cocaine abuse (HCC)   Headache    Body mass index is 25.45 kg/m.   Headache, dizziness and tinnitus, probably postconcussive symptoms from recent head trauma s/p fall: Analgesics as needed for pain.  Continue meclizine. Previously reported acute left subdural hematoma and small subarachnoid hemorrhage on CT head from 06/29/2023 have resolved (no hemorrhage on CT head from 07/15/2023)   Anxiety and depression: Follow-up with psychiatrist   Polysubstance use disorder (alcohol, cocaine): Follow-up with psychiatrist   Diet Order             Diet regular Room service appropriate? Yes; Fluid consistency: Thin  Diet effective now                            Consultants: Psychiatrist  Procedures: None    Medications:    busPIRone  5 mg  Oral BID   docusate sodium  100 mg Oral BID   feeding supplement  237 mL Oral BID BM   multivitamin with minerals  1 tablet Oral Daily   nicotine  14 mg Transdermal Daily   niMODipine  60 mg Oral Q4H   sertraline  25 mg Oral Daily   Continuous Infusions:   Anti-infectives (From admission, onward)    None              Family Communication/Anticipated D/C date and plan/Code Status   DVT prophylaxis:      Code Status: Full Code       Subjective:   Interval events noted.  He complains of severe headache (mainly frontal), dizziness and ringing in the ears.  No hearing impairment.  Objective:    Vitals:   07/15/23 2031 07/16/23 0719 07/16/23 1918 07/17/23 0724  BP: (!) 159/88 106/86 (!) 146/75 109/70  Pulse: 71 69 73 71  Resp:  18  18  Temp: 97.9 F (36.6 C) 97.9 F (36.6 C) 98.3 F (36.8 C) (!) 97.3 F (36.3 C)  TempSrc:      SpO2: 98% 99% 95% 99%  Weight:      Height:       No data found.  No intake or output data in the 24 hours ending 07/17/23 1702 Filed Weights   07/15/23 1350  Weight: 73.7 kg    Exam:  GEN: NAD SKIN: Warm and dry  EYES: EOMI, PERRLA, no nystagmus ENT: MMM CV: RRR PULM: CTA B ABD: soft, ND, NT, +BS CNS: AAO x 3, non focal EXT: No edema or tenderness        Data Reviewed:   I have personally reviewed following labs and imaging studies:  Labs: Labs show the following:   Basic Metabolic Panel: Recent Labs  Lab 07/15/23 0323  NA 135  K 4.9  CL 98  CO2 27  GLUCOSE 121*  BUN 12  CREATININE 0.91  CALCIUM 9.1   GFR Estimated Creatinine Clearance: 76.7 mL/min (by C-G formula based on SCr of 0.91 mg/dL). Liver Function Tests: No results for input(s): "AST", "ALT", "ALKPHOS", "BILITOT", "PROT", "ALBUMIN" in the last 168 hours. No results for input(s): "LIPASE", "AMYLASE" in the last 168 hours. No results for input(s): "AMMONIA" in the last 168 hours. Coagulation profile No results for input(s): "INR",  "PROTIME" in the last 168 hours.  CBC: Recent Labs  Lab 07/15/23 0323  WBC 9.0  HGB 15.0  HCT 45.0  MCV 96.6  PLT 203   Cardiac Enzymes: No results for input(s): "CKTOTAL", "CKMB", "CKMBINDEX", "TROPONINI" in the last 168 hours. BNP (last 3 results) No results for input(s): "PROBNP" in the last 8760 hours. CBG: No results for input(s): "GLUCAP" in the last 168 hours. D-Dimer: No results for input(s): "DDIMER" in the last 72 hours. Hgb A1c: No results for input(s): "HGBA1C" in the last 72 hours. Lipid Profile: No results for input(s): "CHOL", "HDL", "LDLCALC", "TRIG", "CHOLHDL", "LDLDIRECT" in the last 72 hours. Thyroid function studies: No results for input(s): "TSH", "T4TOTAL", "T3FREE", "THYROIDAB" in the last 72 hours.  Invalid input(s): "FREET3" Anemia work up: No results for input(s): "VITAMINB12", "FOLATE", "FERRITIN", "TIBC", "IRON", "RETICCTPCT" in the last 72 hours. Sepsis Labs: Recent Labs  Lab 07/15/23 0323  WBC 9.0    Microbiology Recent Results (from the past 240 hour(s))  SARS Coronavirus 2 by RT PCR (hospital order, performed in [facility] hospital lab) *cepheid single result test* Anterior Nasal Swab     Status: None   Collection Time: 07/15/23 10:11 AM   Specimen: Anterior Nasal Swab  Result Value Ref Range Status   SARS Coronavirus 2 by RT PCR NEGATIVE NEGATIVE Final    Comment: (NOTE) SARS-CoV-2 target nucleic acids are NOT DETECTED.  The SARS-CoV-2 RNA is generally detectable in upper and lower respiratory specimens during the acute phase of infection. The lowest concentration of SARS-CoV-2 viral copies this assay can detect is 250 copies / mL. A negative result does not preclude SARS-CoV-2 infection and should not be used as the sole basis for treatment or other patient management decisions.  A negative result may occur with improper specimen collection / handling, submission of specimen other than nasopharyngeal swab, presence of viral  mutation(s) within the areas targeted by this assay, and inadequate number of viral copies (<250 copies / mL). A negative result must be combined with clinical observations, patient history, and epidemiological information.  Fact Sheet for Patients:   RoadLapTop.co.za  Fact Sheet for Healthcare Providers: http://kim-miller.com/  This test is not yet approved or  cleared by the [place] FDA and has been authorized for detection and/or diagnosis of SARS-CoV-2 by FDA under an Emergency Use Authorization (EUA).  This EUA will remain in effect (meaning this test can be used) for the duration of the COVID-19 declaration under Section 564(b)(1) of the Act, 21 U.S.C. section 360bbb-3(b)(1), unless the authorization is terminated or revoked sooner.  Performed at [facility],  [address], [place], [place] [address]     Procedures and diagnostic studies:  No results found.             LOS: 2 days   Waverley Krempasky  [facility]   Pager on [email or web address]. If 7PM-7AM, please contact night-coverage at [email or web address]     07/17/2023, 5:02 PM

## 2023-07-17 NOTE — Group Note (Signed)
Date:  07/17/2023 Time:  9:05 AM  Group Topic/Focus:  Healthy Communication:   The focus of this group is to discuss communication, barriers to communication, as well as healthy ways to communicate with others.       Participation Level:  Active  Participation Quality:  Appropriate, Attentive, Sharing, and Supportive  Affect:  Appropriate  Cognitive:  Alert, Appropriate, and Oriented  Insight: Appropriate and Good  Engagement in Group:  Engaged, Improving, and Supportive  Modes of Intervention:  Activity  Additional Comments:     Alexis Frock 07/17/2023, 9:05 AM

## 2023-07-17 NOTE — Progress Notes (Signed)
   07/17/23 0603  15 Minute Checks  Location Bedroom  Visual Appearance Calm  Behavior Sleeping  Sleep (Behavioral Health Patients Only)  Calculate sleep? (Click Yes once per 24 hr at 0600 safety check) Yes  Documented sleep last 24 hours 8.5

## 2023-07-17 NOTE — Plan of Care (Signed)
  Problem: Education: Goal: Knowledge of General Education information will improve Description: Including pain rating scale, medication(s)/side effects and non-pharmacologic comfort measures Outcome: Progressing   Problem: Activity: Goal: Risk for activity intolerance will decrease Outcome: Progressing   Problem: Nutrition: Goal: Adequate nutrition will be maintained Outcome: Progressing   Problem: Coping: Goal: Level of anxiety will decrease Outcome: Not Progressing

## 2023-07-17 NOTE — Plan of Care (Signed)
[facility] Tonsillectomy/Adenoidectomy, Postoperative PEDS care plan entered in error.

## 2023-07-17 NOTE — Group Note (Signed)
Date:  07/17/2023 Time:  9:17 PM  Group Topic/Focus:  Overcoming Stress:   The focus of this group is to define stress and help patients assess their triggers.    Participation Level:  Active  Participation Quality:  Appropriate  Affect:  Appropriate  Cognitive:  Appropriate  Insight: Appropriate  Engagement in Group:  Engaged  Modes of Intervention:  Exploration  Additional Comments:    Garry Heater 07/17/2023, 9:17 PM

## 2023-07-17 NOTE — Progress Notes (Signed)
   07/17/23 2200  Psych Admission Type (Psych Patients Only)  Admission Status Voluntary  Psychosocial Assessment  Patient Complaints Anxiety;Depression  Eye Contact Fair  Facial Expression Sad  Affect Sad  Speech Logical/coherent  Interaction Needy  Motor Activity Slow  Appearance/Hygiene Disheveled  Behavior Characteristics Cooperative  Mood Sad  Thought Process  Coherency WDL  Content WDL  Delusions None reported or observed  Perception WDL  Hallucination None reported or observed  Judgment Impaired  Confusion None  Danger to Self  Current suicidal ideation? Denies

## 2023-07-17 NOTE — BH IP Treatment Plan (Signed)
Interdisciplinary Treatment and Diagnostic Plan Update  07/17/2023 Time of Session: 10:25 AM  Randall Harvey. MRN: [id number]  Principal Diagnosis: MDD (major depressive disorder)  Secondary Diagnoses: Principal Problem:   MDD (major depressive disorder) Active Problems:   SAH (subarachnoid hemorrhage) (HCC)   MDD (major depressive disorder), recurrent severe, without psychosis (HCC)   Cocaine abuse (HCC)   Current Medications:  Current Facility-Administered Medications  Medication Dose Route Frequency Provider Last Rate Last Admin   alum & mag hydroxide-simeth (MAALOX/MYLANTA) 200-200-20 MG/5ML suspension 30 mL  30 mL Oral Q4H PRN Lauree Chandler, NP       busPIRone (BUSPAR) tablet 5 mg  5 mg Oral BID Lewanda Rife, MD   5 mg at 07/17/23 0750   butalbital-acetaminophen-caffeine (FIORICET) 50-325-40 MG per tablet 1 tablet  1 tablet Oral Q6H PRN Emeline General, MD   1 tablet at 07/17/23 8469   diphenhydrAMINE (BENADRYL) capsule 50 mg  50 mg Oral TID PRN Lauree Chandler, NP       Or   diphenhydrAMINE (BENADRYL) injection 50 mg  50 mg Intramuscular TID PRN Lauree Chandler, NP       docusate sodium (COLACE) capsule 100 mg  100 mg Oral BID Lewanda Rife, MD   100 mg at 07/17/23 0751   feeding supplement (ENSURE ENLIVE / ENSURE PLUS) liquid 237 mL  237 mL Oral BID BM Sarina Ill, DO   237 mL at 07/17/23 1359   haloperidol (HALDOL) tablet 5 mg  5 mg Oral TID PRN Lauree Chandler, NP       Or   haloperidol lactate (HALDOL) injection 5 mg  5 mg Intramuscular TID PRN Lauree Chandler, NP       hydrOXYzine (ATARAX) tablet 25 mg  25 mg Oral TID PRN Lauree Chandler, NP   25 mg at 07/16/23 2216   LORazepam (ATIVAN) tablet 2 mg  2 mg Oral TID PRN Lauree Chandler, NP       Or   LORazepam (ATIVAN) injection 2 mg  2 mg Intramuscular TID PRN Lauree Chandler, NP       magnesium hydroxide (MILK OF MAGNESIA) suspension 30 mL  30 mL Oral Daily PRN Lauree Chandler, NP       meclizine (ANTIVERT) tablet 12.5 mg  12.5 mg Oral TID PRN Mikey College T, MD   12.5 mg at 07/17/23 1213   melatonin tablet 5 mg  5 mg Oral QHS PRN Lewanda Rife, MD   5 mg at 07/16/23 2216   multivitamin with minerals tablet 1 tablet  1 tablet Oral Daily Sarina Ill, DO   1 tablet at 07/17/23 0751   nicotine (NICODERM CQ - dosed in mg/24 hours) patch 14 mg  14 mg Transdermal Daily Lewanda Rife, MD   14 mg at 07/17/23 0754   niMODipine (NIMOTOP) capsule 60 mg  60 mg Oral Q4H Mikey College T, MD   60 mg at 07/17/23 1213   sertraline (ZOLOFT) tablet 25 mg  25 mg Oral Daily Lauree Chandler, NP   25 mg at 07/17/23 6295   PTA Medications: Medications Prior to Admission  Medication Sig Dispense Refill Last Dose   acetaminophen (TYLENOL) 500 MG tablet Take 2 tablets (1,000 mg total) by mouth every 8 (eight) hours as needed. (Patient not taking: Reported on 02/21/2022) 30 tablet 0    albuterol (VENTOLIN HFA) 108 (90 Base) MCG/ACT inhaler Inhale 2 puffs into the lungs every 6 (  six) hours as needed for wheezing or shortness of breath. (Patient not taking: Reported on 02/21/2022) 8 g 0    docusate sodium (COLACE) 100 MG capsule Take 1 tablet once or twice daily as needed for constipation while taking narcotic pain medicine (Patient not taking: Reported on 02/21/2022) 30 capsule 0    fluticasone (FLONASE) 50 MCG/ACT nasal spray Place 2 sprays into both nostrils daily. (Patient not taking: Reported on 02/21/2022) 16 g 6    folic acid (FOLVITE) 1 MG tablet Take 1 tablet (1 mg total) by mouth daily. (Patient not taking: Reported on 02/21/2022) 30 tablet 0    Multiple Vitamin (MULTIVITAMIN WITH MINERALS) TABS tablet Take 1 tablet by mouth daily. (Patient not taking: Reported on 02/21/2022) 30 tablet 0    polyethylene glycol (MIRALAX / GLYCOLAX) 17 g packet Take 17 g by mouth daily. (Patient not taking: Reported on 02/21/2022) 14 each 0    sertraline (ZOLOFT) 25 MG tablet Take  1 tablet (25 mg total) by mouth daily. (Patient not taking: Reported on 02/04/2022) 30 tablet 3    thiamine 100 MG tablet Take 1 tablet (100 mg total) by mouth daily. (Patient not taking: Reported on 02/21/2022) 30 tablet 0     Patient Stressors:    Patient Strengths:    Treatment Modalities: Medication Management, Group therapy, Case management,  1 to 1 session with clinician, Psychoeducation, Recreational therapy.   Physician Treatment Plan for Primary Diagnosis: MDD (major depressive disorder) Long Term Goal(s): Improvement in symptoms so as ready for discharge   Short Term Goals: Ability to identify changes in lifestyle to reduce recurrence of condition will improve Ability to verbalize feelings will improve Ability to disclose and discuss suicidal ideas Ability to demonstrate self-control will improve Ability to identify and develop effective coping behaviors will improve Ability to maintain clinical measurements within normal limits will improve Compliance with prescribed medications will improve Ability to identify triggers associated with substance abuse/mental health issues will improve  Medication Management: Evaluate patient's response, side effects, and tolerance of medication regimen.  Therapeutic Interventions: 1 to 1 sessions, Unit Group sessions and Medication administration.  Evaluation of Outcomes: Progressing  Physician Treatment Plan for Secondary Diagnosis: Principal Problem:   MDD (major depressive disorder) Active Problems:   SAH (subarachnoid hemorrhage) (HCC)   MDD (major depressive disorder), recurrent severe, without psychosis (HCC)   Cocaine abuse (HCC)  Long Term Goal(s): Improvement in symptoms so as ready for discharge   Short Term Goals: Ability to identify changes in lifestyle to reduce recurrence of condition will improve Ability to verbalize feelings will improve Ability to disclose and discuss suicidal ideas Ability to demonstrate  self-control will improve Ability to identify and develop effective coping behaviors will improve Ability to maintain clinical measurements within normal limits will improve Compliance with prescribed medications will improve Ability to identify triggers associated with substance abuse/mental health issues will improve     Medication Management: Evaluate patient's response, side effects, and tolerance of medication regimen.  Therapeutic Interventions: 1 to 1 sessions, Unit Group sessions and Medication administration.  Evaluation of Outcomes: Progressing   RN Treatment Plan for Primary Diagnosis: MDD (major depressive disorder) Long Term Goal(s): Knowledge of disease and therapeutic regimen to maintain health will improve  Short Term Goals: Ability to remain free from injury will improve, Ability to verbalize frustration and anger appropriately will improve, Ability to demonstrate self-control, Ability to participate in decision making will improve, Ability to verbalize feelings will improve, Ability to disclose and discuss  suicidal ideas, Ability to identify and develop effective coping behaviors will improve, and Compliance with prescribed medications will improve  Medication Management: RN will administer medications as ordered by provider, will assess and evaluate patient's response and provide education to patient for prescribed medication. RN will report any adverse and/or side effects to prescribing provider.  Therapeutic Interventions: 1 on 1 counseling sessions, Psychoeducation, Medication administration, Evaluate responses to treatment, Monitor vital signs and CBGs as ordered, Perform/monitor CIWA, COWS, AIMS and Fall Risk screenings as ordered, Perform wound care treatments as ordered.  Evaluation of Outcomes: Progressing   LCSW Treatment Plan for Primary Diagnosis: MDD (major depressive disorder) Long Term Goal(s): Safe transition to appropriate next level of care at discharge,  Engage patient in therapeutic group addressing interpersonal concerns.  Short Term Goals: Engage patient in aftercare planning with referrals and resources, Increase social support, Increase ability to appropriately verbalize feelings, Increase emotional regulation, Facilitate acceptance of mental health diagnosis and concerns, Facilitate patient progression through stages of change regarding substance use diagnoses and concerns, Identify triggers associated with mental health/substance abuse issues, and Increase skills for wellness and recovery  Therapeutic Interventions: Assess for all discharge needs, 1 to 1 time with Social worker, Explore available resources and support systems, Assess for adequacy in community support network, Educate family and significant other(s) on suicide prevention, Complete Psychosocial Assessment, Interpersonal group therapy.  Evaluation of Outcomes: Progressing   Progress in Treatment: Attending groups: Yes. and No. Participating in groups: Yes. and No. Taking medication as prescribed: Yes. Toleration medication: Yes. Family/Significant other contact made: No, will contact:  CSW will contact if given permission  Patient understands diagnosis: Yes. Discussing patient identified problems/goals with staff: Yes. Medical problems stabilized or resolved: No. Denies suicidal/homicidal ideation: No. Issues/concerns per patient self-inventory: No. Other: None   New problem(s) identified: No, Describe:  None identified   New Short Term/Long Term Goal(s):  elimination of symptoms of psychosis, medication management for mood stabilization; elimination of SI thoughts; development of comprehensive mental wellness plan.   Patient Goals:  " Get better, stop this pain and dizziness and stuff"   Discharge Plan or Barriers: CSW will assist with appropriate discharge planning   Reason for Continuation of Hospitalization: Depression Medical Issues Medication  stabilization  Estimated Length of Stay: 1 to 7 days   Last 3 [place] Suicide Severity Risk Score: Flowsheet Row ED from 07/15/2023 in [facility] from 06/29/2023 in [facility] Emergency Department at [facility] ED from 02/19/2023 in [facility] Department at [facility]  C-SSRS RISK CATEGORY No Risk No Risk No Risk       Last PHQ 2/9 Scores:    09/24/2021   11:36 AM 09/19/2021    1:07 PM 08/20/2021   11:35 AM  Depression screen PHQ 2/9  Decreased Interest 3 0 0  Down, Depressed, Hopeless 3 0 0  PHQ - 2 Score 6 0 0  Altered sleeping 1 0 0  Tired, decreased energy 1 0 0  Change in appetite 0 0 0  Feeling bad or failure about yourself  3 0 0  Trouble concentrating 0 0 0  Moving slowly or fidgety/restless 0 0 0  Suicidal thoughts 1 0 0  PHQ-9 Score 12 0 0  Difficult doing work/chores Extremely dIfficult Not difficult at all Not difficult at all    Scribe for Treatment Team: Elza Rafter, Theresia Majors 07/17/2023 2:37 PM

## 2023-07-17 NOTE — Progress Notes (Signed)
Patient pleasant and cooperative. Sad affect.  Tearful during assessment.  Endorses anxiety and depression.  Denies SI while on the unit.  Contracts for safety. Denies HI and AVH.  Pain 10/10 in head. Poor sleep.    Hospitalist on unit to see patient this morning.   Compliant with scheduled medications.  15 min checks in place for safety. Patient isolated to room with exception of meals.  Appropriate, but minimal interaction with peers.  Appropriate interaction with staff.

## 2023-07-17 NOTE — Progress Notes (Signed)
D- Patient alert and oriented. Flat affect. C/o H/A. Pain scale 10/10. Reports anxiety and depression. Denies SI, HI, AVH, and pain.  A- Scheduled medications administered to patient, per MD orders. PRNs given for anxiety, pain and insomnia.  Support and encouragement provided.  Routine safety checks conducted every 15 minutes. Patient informed to notify staff with problems or concerns. R- No adverse drug reactions noted. Patient contracts for safety at this time. Patient compliant with medications and treatment plan. Patient receptive, calm, and cooperative. Patient interacts well with others on the unit.  Patient remains safe at this time.

## 2023-07-17 NOTE — Plan of Care (Signed)

## 2023-07-17 NOTE — Progress Notes (Addendum)
[facility] MD Progress Note  07/17/2023 1:16 PM Randall Harvey.  MRN:  [id number]   Randall Harvey. Randall Harvey, Randall Harvey. is a 62 year old male with past psychiatric history of anxiety disorder, polysubstance use (EtOH, alcohol, marijuana, cocaine, barbiturates, tobacco), and alcohol induced mood disorder. Mr. Randall Harvey initially presented to [facility] regional ED after a fall, he was seen, and ultimately discharged to the ED lobby. Once in the lobby he reported thoughts of wanting to harm himself.   Subjective: Chart reviewed, case discussed in multidisciplinary meeting today, patient seen in treatment team meeting and during rounds.  Patient continues to report report depressed mood and anhedonia.  He reports his headache is the "same".  Patient was provided with support and reassurance.  Hospitalist team is following the patient for medical needs.  Patient reports that his "goal is" to get better and the pain to stop".  Patient gets tearful talking about his headaches.  He endorses suicidal thoughts, denies any intention to harm himself on the unit.  He denies psychotic or manic symptoms.  Principal Problem: MDD (major depressive disorder) Diagnosis: Principal Problem:   MDD (major depressive disorder) Active Problems:   SAH (subarachnoid hemorrhage) (HCC)   MDD (major depressive disorder), recurrent severe, without psychosis (HCC)   Cocaine abuse (HCC)   Past Psychiatric History: Patient reports remote history of depression. Patient reports he is not treatment for depression. He denies past history of inpatient psychiatric treatment.   Past Medical History:  Past Medical History:  Diagnosis Date   Alcohol intoxication (HCC)    Anticoagulant long-term use    lifetime use, coumadin therapy, then Xarelto   Anxiety    Arthritis    Cancer (HCC)    renal   Carotid atherosclerosis    <50% left and right   Clotting disorder (HCC)    COPD (chronic obstructive pulmonary disease) (HCC)    Depression    Elevated liver  enzymes 11/2013   Family history of cancer    Fatty liver    H/O drug abuse (HCC)    H/O ETOH abuse    Hepatitis    History of traumatic brain injury    Hypertension    Lymphadenopathy    Obesity    Personal history of DVT (deep vein thrombosis) 06/2013   Pre-diabetes    Primary osteoarthritis of left knee    Pulmonary embolism and infarction (HCC) 06/2013   Pulmonary embolism and infarction (HCC) 11/2013   Pulmonary embolus with infarction [facility])    Renal cell carcinoma (HCC)    Renal mass, left 06/2013   with ureteral obstruction   Stroke (HCC)    Subarachnoid hemorrhage following injury (HCC)    Thrombocythemia    Thrombocytopenia (HCC)    Tobacco abuse     Past Surgical History:  Procedure Laterality Date   ANKLE FRACTURE SURGERY Right 12/2012   ANKLE SURGERY     EXCISION METACARPAL MASS Right 09/18/2016   Procedure: EXCISION METACARPAL MASS;  Surgeon: Kennedy Bucker, MD;  Location: [facility] ORS;  Service: Orthopedics;  Laterality: Right;  5th digit    HERNIA REPAIR     Umbilical Hernia   ORBITAL FRACTURE SURGERY Left    ROBOTIC ASSITED PARTIAL NEPHRECTOMY Left Oct 2014   Family History:  Family History  Problem Relation Age of Onset   Pancreatic cancer Mother    Colon cancer Father    Diabetes Father    Colon cancer Sister    COPD Sister    Seizures Sister  Ovarian cancer Sister    Cancer Paternal Grandmother        bone   Stroke Paternal Grandfather    Heart disease Paternal Grandfather    Hypertension Neg Hx     Social History:  Social History   Substance and Sexual Activity  Alcohol Use Yes   Comment: occasionally     Social History   Substance and Sexual Activity  Drug Use Yes   Types: Marijuana   Comment: occ    Social History   Socioeconomic History   Marital status: Divorced    Spouse name: Not on file   Number of children: Not on file   Years of education: Not on file   Highest education level: Not on file  Occupational History   Not  on file  Tobacco Use   Smoking status: Every Day    Current packs/day: 0.50    Types: Cigarettes   Smokeless tobacco: Never  Vaping Use   Vaping status: Never Used  Substance and Sexual Activity   Alcohol use: Yes    Comment: occasionally   Drug use: Yes    Types: Marijuana    Comment: occ   Sexual activity: Yes  Other Topics Concern   Not on file  Social History Narrative   ** Merged History Encounter **       Social Determinants of Health   Financial Resource Strain: Not on file  Food Insecurity: Food Insecurity Present (07/15/2023)   Hunger Vital Sign    Worried About Running Out of Food in the Last Year: Sometimes true    Ran Out of Food in the Last Year: Sometimes true  Transportation Needs: Unmet Transportation Needs (07/15/2023)   PRAPARE - Administrator, Civil Service (Medical): Yes    Lack of Transportation (Non-Medical): Yes  Physical Activity: Not on file  Stress: Not on file  Social Connections: Not on file   Additional Social History:   Patient reports he has an apartment he states in.  Patient reports he gets $1700/month in disability                      Sleep: Poor  Appetite:  Poor  Current Medications: Current Facility-Administered Medications  Medication Dose Route Frequency Provider Last Rate Last Admin   alum & mag hydroxide-simeth (MAALOX/MYLANTA) 200-200-20 MG/5ML suspension 30 mL  30 mL Oral Q4H PRN Lauree Chandler, NP       busPIRone (BUSPAR) tablet 5 mg  5 mg Oral BID Lewanda Rife, MD   5 mg at 07/17/23 0750   butalbital-acetaminophen-caffeine (FIORICET) 50-325-40 MG per tablet 1 tablet  1 tablet Oral Q6H PRN Emeline General, MD   1 tablet at 07/17/23 8295   diphenhydrAMINE (BENADRYL) capsule 50 mg  50 mg Oral TID PRN Lauree Chandler, NP       Or   diphenhydrAMINE (BENADRYL) injection 50 mg  50 mg Intramuscular TID PRN Lauree Chandler, NP       docusate sodium (COLACE) capsule 100 mg  100 mg Oral BID  Lewanda Rife, MD   100 mg at 07/17/23 0751   feeding supplement (ENSURE ENLIVE / ENSURE PLUS) liquid 237 mL  237 mL Oral BID BM Sarina Ill, DO   237 mL at 07/16/23 1341   haloperidol (HALDOL) tablet 5 mg  5 mg Oral TID PRN Lauree Chandler, NP       Or   haloperidol lactate (HALDOL) injection 5  mg  5 mg Intramuscular TID PRN Lauree Chandler, NP       hydrOXYzine (ATARAX) tablet 25 mg  25 mg Oral TID PRN Lauree Chandler, NP   25 mg at 07/16/23 2216   LORazepam (ATIVAN) tablet 2 mg  2 mg Oral TID PRN Lauree Chandler, NP       Or   LORazepam (ATIVAN) injection 2 mg  2 mg Intramuscular TID PRN Lauree Chandler, NP       magnesium hydroxide (MILK OF MAGNESIA) suspension 30 mL  30 mL Oral Daily PRN Lauree Chandler, NP       meclizine (ANTIVERT) tablet 12.5 mg  12.5 mg Oral TID PRN Mikey College T, MD   12.5 mg at 07/17/23 1213   melatonin tablet 5 mg  5 mg Oral QHS PRN Lewanda Rife, MD   5 mg at 07/16/23 2216   multivitamin with minerals tablet 1 tablet  1 tablet Oral Daily Sarina Ill, DO   1 tablet at 07/17/23 0751   nicotine (NICODERM CQ - dosed in mg/24 hours) patch 14 mg  14 mg Transdermal Daily Lewanda Rife, MD   14 mg at 07/17/23 0754   niMODipine (NIMOTOP) capsule 60 mg  60 mg Oral Q4H Mikey College T, MD   60 mg at 07/17/23 1213   sertraline (ZOLOFT) tablet 25 mg  25 mg Oral Daily Lauree Chandler, NP   25 mg at 07/17/23 8119    Lab Results: No results found for this or any previous visit (from the past 48 hour(s)).  Blood Alcohol level:  Lab Results  Component Value Date   ETH <10 07/15/2023   ETH 250 (H) 01/13/2023    Metabolic Disorder Labs: Lab Results  Component Value Date   HGBA1C 5.6 08/20/2021   MPG 114 08/20/2021   MPG 146 09/27/2018   No results found for: "PROLACTIN" Lab Results  Component Value Date   CHOL 169 09/19/2021   TRIG 307 (H) 09/19/2021   HDL 49 09/19/2021   CHOLHDL 3.4 09/19/2021   VLDL NOT  CALC 08/07/2016   LDLCALC 83 09/19/2021   LDLCALC 98 09/27/2018         Musculoskeletal: Strength & Muscle Tone: within normal limits Gait & Station: normal Patient leans: N/A   Psychiatric Specialty Exam:   Presentation  General Appearance:  Disheveled   Eye Contact: Fair   Speech: Normal Rate   Speech Volume: Increased   Handedness: Right     Mood and Affect  Mood: Depressed   Affect: Blunt; Tearful     Thought Process  Thought Processes: Coherent   Descriptions of Associations:Intact   Orientation:Full (Time, Place and Person)   Thought Content: Paranoia   History of Schizophrenia/Schizoaffective disorder:No   Duration of Psychotic Symptoms:N/A   Hallucinations:Hallucinations: None   Ideas of Reference:None   Suicidal Thoughts:Suicidal Thoughts: Yes, Active SI Active Intent and/or Plan: With Plan (Reports he would step in front of a train) SI Passive Intent and/or Plan: -- (States he will jump in front of train)   Homicidal Thoughts:Homicidal Thoughts: No     Sensorium  Memory: Immediate Fair; Recent Poor; Remote Poor   Judgment: Fair   Insight: Fair     Chartered certified accountant: Fair   Attention Span: Good   Recall: Poor   Fund of Knowledge: Fair   Language: Good     Psychomotor Activity  Psychomotor Activity: Psychomotor Activity: Normal     Assets  Assets:  Communication Skills; Financial Resources/Insurance; Housing     Sleep  Sleep: Sleep: Fair       Physical Exam: Physical Exam Vitals and nursing note reviewed.  HENT:     Head: Normocephalic and atraumatic.  Cardiovascular:     Rate and Rhythm: Normal rate.  Pulmonary:     Effort: Pulmonary effort is normal.  Neurological:     General: No focal deficit present.     Mental Status: He is alert and oriented to person, place, and time.      Review of Systems  Constitutional:  Negative for chills and fever.  HENT:  Positive for  hearing loss.   Eyes:  Negative for blurred vision.  Respiratory:  Negative for shortness of breath.   Cardiovascular:  Negative for chest pain and palpitations.  Gastrointestinal:  Negative for nausea.  Neurological:  Positive for headaches.  Psychiatric/Behavioral:  Positive for suicidal ideas.    Blood pressure 109/70, pulse 71, temperature (!) 97.3 F (36.3 C), resp. rate 18, height 5\' 7"  (1.702 m), weight 73.7 kg, SpO2 99%. Body mass index is 25.45 kg/m.   Treatment Plan Summary: Daily contact with patient to assess and evaluate symptoms and progress in treatment and Medication management  Patient is admitted to locked unit under safety precautions Patient agrees to try BuSpar for anxiety and Zoloft for depression.  Side effect of both medicines including GI effects and dizziness discussed with the patient. Continue on Zoloft 25 mg by mouth daily Continue on BuSpar 5 mg by mouth daily Patient was encouraged to attend group and work on a safe discharge plan Patient was encouraged to abstain from illicit drugs and alcohol Hospitalist consulted to follow-up upon patient's headache and TBI Patient was seen by Dr. Mikey College yesterday, please refer to his note for details.  Dr. Teola Bradley input is appreciated  Lewanda Rife, MD

## 2023-07-17 NOTE — Group Note (Signed)
Date:  07/17/2023 Time:  9:11 AM  Group Topic/Focus:  Self Care:   The focus of this group is to help patients understand the importance of self-care in order to improve or restore emotional, physical, spiritual, interpersonal, and financial health.    Participation Level:  Active  Participation Quality:  Appropriate, Attentive, and Supportive  Affect:  Appropriate  Cognitive:  Alert, Appropriate, and Oriented  Insight: Appropriate, Good, and Improving  Engagement in Group:  Engaged and Supportive  Modes of Intervention:  Discussion  Additional Comments:     Alexis Frock 07/17/2023, 9:11 AM

## 2023-07-18 DIAGNOSIS — F1410 Cocaine abuse, uncomplicated: Secondary | ICD-10-CM | POA: Diagnosis not present

## 2023-07-18 DIAGNOSIS — F332 Major depressive disorder, recurrent severe without psychotic features: Secondary | ICD-10-CM | POA: Diagnosis not present

## 2023-07-18 NOTE — Group Note (Signed)
Date:  07/18/2023 Time:  4:37 PM  Group Topic/Focus:  Activity Group:  The focus of the group is to encourage patients to step outside in the courtyard to get some fresh air and some exercise for the benefit of their mental health.    Participation Level:  Active  Participation Quality:  Appropriate  Affect:  Appropriate  Cognitive:  Appropriate  Insight: Appropriate  Engagement in Group:  Engaged  Modes of Intervention:  Activity  Additional Comments:    Randall Harvey Randall Harvey 07/18/2023, 4:37 PM

## 2023-07-18 NOTE — Progress Notes (Signed)
[facility] MD Progress Note  07/18/2023 6:34 PM [name].  MRN:  [id number]   Coletin Sigrist. Moris, Bucker. is a 62 year old male with past psychiatric history of anxiety disorder, polysubstance use (EtOH, alcohol, marijuana, cocaine, barbiturates, tobacco), and alcohol induced mood disorder. Mr. Ulep initially presented to [facility] regional ED after a fall, he was seen, and ultimately discharged to the ED lobby. Once in the lobby he reported thoughts of wanting to harm himself.   Subjective: Chart reviewed, case discussed with staff today, patient seen during rounds.  Patient mental status since yesterday.  Patient continues to report depressed mood and anhedonia.  He reports his headache is the "same".  Patient was provided with support and reassurance.  Patient gets tearful talking about his headaches.  He endorses suicidal thoughts, denies any intention to harm himself on the unit.  He denies psychotic or manic symptoms.  Principal Problem: MDD (major depressive disorder) Diagnosis: Principal Problem:   MDD (major depressive disorder) Active Problems:   MDD (major depressive disorder), recurrent severe, without psychosis (HCC)   Cocaine abuse (HCC)   Headache   Past Psychiatric History: Patient reports remote history of depression. Patient reports he is not treatment for depression. He denies past history of inpatient psychiatric treatment.   Past Medical History:  Past Medical History:  Diagnosis Date   Alcohol intoxication (HCC)    Anticoagulant long-term use    lifetime use, coumadin therapy, then Xarelto   Anxiety    Arthritis    Cancer (HCC)    renal   Carotid atherosclerosis    <50% left and right   Clotting disorder (HCC)    COPD (chronic obstructive pulmonary disease) (HCC)    Depression    Elevated liver enzymes 11/2013   Family history of cancer    Fatty liver    H/O drug abuse (HCC)    H/O ETOH abuse    Hepatitis    History of traumatic brain injury    Hypertension     Lymphadenopathy    Obesity    Personal history of DVT (deep vein thrombosis) 06/2013   Pre-diabetes    Primary osteoarthritis of left knee    Pulmonary embolism and infarction (HCC) 06/2013   Pulmonary embolism and infarction (HCC) 11/2013   Pulmonary embolus with infarction [facility])    Renal cell carcinoma (HCC)    Renal mass, left 06/2013   with ureteral obstruction   Stroke (HCC)    Subarachnoid hemorrhage following injury (HCC)    Thrombocythemia    Thrombocytopenia (HCC)    Tobacco abuse     Past Surgical History:  Procedure Laterality Date   ANKLE FRACTURE SURGERY Right 12/2012   ANKLE SURGERY     EXCISION METACARPAL MASS Right 09/18/2016   Procedure: EXCISION METACARPAL MASS;  Surgeon: Kennedy Bucker, MD;  Location: [facility] ORS;  Service: Orthopedics;  Laterality: Right;  5th digit    HERNIA REPAIR     Umbilical Hernia   ORBITAL FRACTURE SURGERY Left    ROBOTIC ASSITED PARTIAL NEPHRECTOMY Left Oct 2014   Family History:  Family History  Problem Relation Age of Onset   Pancreatic cancer Mother    Colon cancer Father    Diabetes Father    Colon cancer Sister    COPD Sister    Seizures Sister    Ovarian cancer Sister    Cancer Paternal Grandmother        bone   Stroke Paternal Grandfather    Heart disease Paternal Grandfather  Hypertension Neg Hx     Social History:  Social History   Substance and Sexual Activity  Alcohol Use Yes   Comment: occasionally     Social History   Substance and Sexual Activity  Drug Use Yes   Types: Marijuana   Comment: occ    Social History   Socioeconomic History   Marital status: Divorced    Spouse name: Not on file   Number of children: Not on file   Years of education: Not on file   Highest education level: Not on file  Occupational History   Not on file  Tobacco Use   Smoking status: Every Day    Current packs/day: 0.50    Types: Cigarettes   Smokeless tobacco: Never  Vaping Use   Vaping status: Never Used   Substance and Sexual Activity   Alcohol use: Yes    Comment: occasionally   Drug use: Yes    Types: Marijuana    Comment: occ   Sexual activity: Yes  Other Topics Concern   Not on file  Social History Narrative   ** Merged History Encounter **       Social Determinants of Health   Financial Resource Strain: Not on file  Food Insecurity: Food Insecurity Present (07/15/2023)   Hunger Vital Sign    Worried About Running Out of Food in the Last Year: Sometimes true    Ran Out of Food in the Last Year: Sometimes true  Transportation Needs: Unmet Transportation Needs (07/15/2023)   PRAPARE - Administrator, Civil Service (Medical): Yes    Lack of Transportation (Non-Medical): Yes  Physical Activity: Not on file  Stress: Not on file  Social Connections: Not on file   Additional Social History:   Patient reports he has an apartment he states in.  Patient reports he gets $1700/month in disability                      Sleep: Poor  Appetite:  Poor  Current Medications: Current Facility-Administered Medications  Medication Dose Route Frequency Provider Last Rate Last Admin   alum & mag hydroxide-simeth (MAALOX/MYLANTA) 200-200-20 MG/5ML suspension 30 mL  30 mL Oral Q4H PRN Lauree Chandler, NP       busPIRone (BUSPAR) tablet 5 mg  5 mg Oral BID Lewanda Rife, MD   5 mg at 07/18/23 0948   butalbital-acetaminophen-caffeine (FIORICET) 50-325-40 MG per tablet 1 tablet  1 tablet Oral Q6H PRN Mikey College T, MD   1 tablet at 07/18/23 1117   diphenhydrAMINE (BENADRYL) capsule 50 mg  50 mg Oral TID PRN Lauree Chandler, NP       Or   diphenhydrAMINE (BENADRYL) injection 50 mg  50 mg Intramuscular TID PRN Lauree Chandler, NP       docusate sodium (COLACE) capsule 100 mg  100 mg Oral BID Lewanda Rife, MD   100 mg at 07/18/23 0948   feeding supplement (ENSURE ENLIVE / ENSURE PLUS) liquid 237 mL  237 mL Oral BID BM Sarina Ill, DO   237 mL at  07/18/23 0950   haloperidol (HALDOL) tablet 5 mg  5 mg Oral TID PRN Lauree Chandler, NP       Or   haloperidol lactate (HALDOL) injection 5 mg  5 mg Intramuscular TID PRN Lauree Chandler, NP       hydrOXYzine (ATARAX) tablet 25 mg  25 mg Oral TID PRN Lauree Chandler, NP  25 mg at 07/17/23 2114   LORazepam (ATIVAN) tablet 2 mg  2 mg Oral TID PRN Lauree Chandler, NP       Or   LORazepam (ATIVAN) injection 2 mg  2 mg Intramuscular TID PRN Lauree Chandler, NP       magnesium hydroxide (MILK OF MAGNESIA) suspension 30 mL  30 mL Oral Daily PRN Lauree Chandler, NP       meclizine (ANTIVERT) tablet 12.5 mg  12.5 mg Oral TID PRN Mikey College T, MD   12.5 mg at 07/18/23 0957   melatonin tablet 5 mg  5 mg Oral QHS PRN Lewanda Rife, MD   5 mg at 07/17/23 2114   multivitamin with minerals tablet 1 tablet  1 tablet Oral Daily Sarina Ill, DO   1 tablet at 07/18/23 0948   nicotine (NICODERM CQ - dosed in mg/24 hours) patch 14 mg  14 mg Transdermal Daily Lewanda Rife, MD   14 mg at 07/18/23 0949   niMODipine (NIMOTOP) capsule 60 mg  60 mg Oral Q4H Mikey College T, MD   60 mg at 07/18/23 1611   sertraline (ZOLOFT) tablet 25 mg  25 mg Oral Daily Lauree Chandler, NP   25 mg at 07/18/23 9147    Lab Results: No results found for this or any previous visit (from the past 48 hour(s)).  Blood Alcohol level:  Lab Results  Component Value Date   ETH <10 07/15/2023   ETH 250 (H) 01/13/2023    Metabolic Disorder Labs: Lab Results  Component Value Date   HGBA1C 5.6 08/20/2021   MPG 114 08/20/2021   MPG 146 09/27/2018   No results found for: "PROLACTIN" Lab Results  Component Value Date   CHOL 169 09/19/2021   TRIG 307 (H) 09/19/2021   HDL 49 09/19/2021   CHOLHDL 3.4 09/19/2021   VLDL NOT CALC 08/07/2016   LDLCALC 83 09/19/2021   LDLCALC 98 09/27/2018         Musculoskeletal: Strength & Muscle Tone: within normal limits Gait & Station:  normal Patient leans: N/A   Psychiatric Specialty Exam:   Presentation  General Appearance:  Disheveled   Eye Contact: Fair   Speech: Normal Rate   Speech Volume: Normal  Handedness: Right     Mood and Affect  Mood: Depressed   Affect: Blunt; Tearful     Thought Process  Thought Processes: Coherent   Descriptions of Associations:Intact   Orientation:Full (Time, Place and Person)   Thought Content: Improved   History of Schizophrenia/Schizoaffective disorder:No   Duration of Psychotic Symptoms:N/A   Hallucinations:Hallucinations: None   Ideas of Reference:None   Suicidal Thoughts:Suicidal Thoughts: Yes, Active SI Active Intent and/or Plan: With Plan (Reports he would step in front of a train) SI Passive Intent and/or Plan: -- (States he will jump in front of train)   Homicidal Thoughts:Homicidal Thoughts: No     Sensorium  Memory: Immediate Fair; Recent Poor; Remote Poor   Judgment: Fair   Insight: Fair     Chartered certified accountant: Fair   Attention Span: Good   Recall: Poor   Fund of Knowledge: Fair   Language: Good     Psychomotor Activity  Psychomotor Activity: Psychomotor Activity: Normal     Assets  Assets: Communication Skills; Financial Resources/Insurance; Housing     Sleep  Sleep: Sleep: Fair       Physical Exam: Physical Exam Vitals and nursing note reviewed.  HENT:  Head: Normocephalic and atraumatic.  Cardiovascular:     Rate and Rhythm: Normal rate.  Pulmonary:     Effort: Pulmonary effort is normal.  Neurological:     General: No focal deficit present.     Mental Status: He is alert and oriented to person, place, and time.      Review of Systems  Constitutional:  Negative for chills and fever.  HENT:  Positive for hearing loss.   Eyes:  Negative for blurred vision.  Respiratory:  Negative for shortness of breath.   Cardiovascular:  Negative for chest pain and palpitations.   Gastrointestinal:  Negative for nausea.  Neurological:  Positive for headaches.  Psychiatric/Behavioral:  Positive for suicidal ideas.    Blood pressure 111/81, pulse 82, temperature (!) 97.2 F (36.2 C), resp. rate 18, height 5\' 7"  (1.702 m), weight 73.7 kg, SpO2 97%. Body mass index is 25.45 kg/m.   Treatment Plan Summary: Daily contact with patient to assess and evaluate symptoms and progress in treatment and Medication management  Patient is admitted to locked unit under safety precautions Patient agrees to try BuSpar for anxiety and Zoloft for depression.  Side effect of both medicines including GI effects and dizziness discussed with the patient. Continue on Zoloft 25 mg by mouth daily Continue on BuSpar 5 mg by mouth daily Patient was encouraged to attend group and work on a safe discharge plan Patient was encouraged to abstain from illicit drugs and alcohol Hospitalist consulted to follow-up upon patient's headache and TBI Patient is followed by hospitalist team for headache  Lewanda Rife, MD

## 2023-07-18 NOTE — Group Note (Unsigned)
Date:  07/18/2023 Time:  11:45 PM  Group Topic/Focus:  Developing a Wellness Toolbox:   The focus of this group is to help patients develop a "wellness toolbox" with skills and strategies to promote recovery upon discharge.     Participation Level:  {BHH PARTICIPATION LKGMW:10272}  Participation Quality:  {BHH PARTICIPATION QUALITY:22265}  Affect:  {BHH AFFECT:22266}  Cognitive:  {BHH COGNITIVE:22267}  Insight: {BHH Insight2:20797}  Engagement in Group:  {BHH ENGAGEMENT IN ZDGUY:40347}  Modes of Intervention:  {BHH MODES OF INTERVENTION:22269}  Additional Comments:  ***  Garry Heater 07/18/2023, 11:45 PM

## 2023-07-18 NOTE — Group Note (Signed)
Date:  07/18/2023 Time:  6:33 PM  Group Topic/Focus:  Coping With Mental Health Crisis:   The purpose of this group is to help patients identify strategies for coping with mental health crisis.  Group discusses possible causes of crisis and ways to manage them effectively.    Participation Level:  Active  Participation Quality:  Appropriate  Affect:  Appropriate  Cognitive:  Appropriate  Insight: Appropriate  Engagement in Group:  Engaged  Modes of Intervention:  Activity  Additional Comments:    Mary Sella Angles Trevizo 07/18/2023, 6:33 PM

## 2023-07-18 NOTE — Plan of Care (Signed)
  Problem: Coping: Goal: Level of anxiety will decrease Outcome: Progressing   Problem: Pain Managment: Goal: General experience of comfort will improve Outcome: Progressing   Problem: Safety: Goal: Ability to remain free from injury will improve Outcome: Progressing   

## 2023-07-18 NOTE — Progress Notes (Signed)
Patient was admitted to [facility] for Depression. Patient was also dx with subdural hematoma on 9/30 and again on 10/16. Hx of alcoholism with multiple falls leading to several subdural hematomas over the last few years. Patient is calm, cooperative and complains of depression and head pain.  Will continue to monitor.

## 2023-07-18 NOTE — Progress Notes (Signed)
D- Patient alert and oriented. Flat and sad affect. C/o H/A and difficulty sleeping. Pain scale 9/10. Endorses anxiety. Denies SI, HI, AVH, and pain. Quotes. Goal. How did they do with achieving previous goal. A- Scheduled medications administered to patient, per MD orders. PRNs given for anxiety, pain, and insomnia. Tol well.  Support and encouragement provided.  Routine safety checks conducted every 15 minutes. Patient informed to notify staff with problems or concerns. R- No adverse drug reactions noted. Patient contracts for safety at this time. Patient compliant with medications and treatment plan. Patient receptive, calm, and cooperative. Patient interacts well with others on the unit.  Patient remains safe at this time.

## 2023-07-18 NOTE — Progress Notes (Signed)
   07/18/23 0629  15 Minute Checks  Location Bedroom  Visual Appearance Calm  Behavior Sleeping  Sleep (Behavioral Health Patients Only)  Calculate sleep? (Click Yes once per 24 hr at 0600 safety check) Yes  Documented sleep last 24 hours 8.25

## 2023-07-18 NOTE — Progress Notes (Signed)
Progress Note    Randall Harvey.  ZOX:[id number] DOB: 1961-09-17  DOA: 07/15/2023 PCP: Margarita Mail, DO      Brief Narrative:    Medical records reviewed and are as summarized below:  Randall Harvey. is a 62 y.o. male with medical history significant for bilateral knee osteoarthritis, alcohol use disorder, severe depression, anxiety, homeless nurse, who was brought to the hospital by [facility] Department on 07/15/2023 for evaluation of a fall.  He was admitted to behavioral health unit because of major depressive disorder.  Of note, patient was seen in the ED on 06/29/2023 for fall and workup at that time revealed acute left cerebral subdural hematoma measuring up to 3 mm and small volume acute subarachnoid hemorrhage over the left temporal bone.  He was evaluated by the neurosurgeon on that visit and outpatient follow-up was recommended.  The hospitalist team was consulted on 07/16/2023 for evaluation of headache and dizziness.      Assessment/Plan:   Principal Problem:   MDD (major depressive disorder) Active Problems:   MDD (major depressive disorder), recurrent severe, without psychosis (HCC)   Cocaine abuse (HCC)   Headache    Body mass index is 25.45 kg/m.   Headache, dizziness and tinnitus, probably postconcussive symptoms from recent head trauma s/p fall: Continue analgesics as needed for pain.  Continue meclizine.   Previously reported acute left subdural hematoma and small subarachnoid hemorrhage on CT head from 06/29/2023 have resolved (no hemorrhage on CT head from 07/15/2023)   Anxiety and depression: Follow-up with psychiatrist   Polysubstance use disorder (alcohol, cocaine): Follow-up with psychiatrist   Hospitalist team will sign off at this time.  Please call with questions.   Diet Order             Diet regular Room service appropriate? Yes; Fluid consistency: Thin  Diet effective now                             Consultants: Psychiatrist  Procedures: None    Medications:    busPIRone  5 mg Oral BID   docusate sodium  100 mg Oral BID   feeding supplement  237 mL Oral BID BM   multivitamin with minerals  1 tablet Oral Daily   nicotine  14 mg Transdermal Daily   niMODipine  60 mg Oral Q4H   sertraline  25 mg Oral Daily   Continuous Infusions:   Anti-infectives (From admission, onward)    None              Family Communication/Anticipated D/C date and plan/Code Status   DVT prophylaxis:      Code Status: Full Code       Subjective:   Interval events noted.  He complains of headache but headache is a little better today.  No dizziness but he said he has not really walked this morning.  Objective:    Vitals:   07/16/23 1918 07/17/23 0724 07/17/23 1945 07/18/23 0729  BP: (!) 146/75 109/70 139/85 111/81  Pulse: 73 71 86 82  Resp:  18    Temp: 98.3 F (36.8 C) (!) 97.3 F (36.3 C) 98.7 F (37.1 C) (!) 97.2 F (36.2 C)  TempSrc:   Oral   SpO2: 95% 99% 96% 97%  Weight:      Height:       No data found.  No intake or output data in the 24  hours ending 07/18/23 1735 Filed Weights   07/15/23 1350  Weight: 73.7 kg    Exam:   GEN: NAD SKIN: No rash EYES: EOMI, PERRLA, no nystagmus ENT: MMM CV: RRR PULM: CTA B ABD: soft, ND, NT, +BS CNS: AAO x 3, non focal EXT: No edema or tenderness      Data Reviewed:   I have personally reviewed following labs and imaging studies:  Labs: Labs show the following:   Basic Metabolic Panel: Recent Labs  Lab 07/15/23 0323  NA 135  K 4.9  CL 98  CO2 27  GLUCOSE 121*  BUN 12  CREATININE 0.91  CALCIUM 9.1   GFR Estimated Creatinine Clearance: 76.7 mL/min (by C-G formula based on SCr of 0.91 mg/dL). Liver Function Tests: No results for input(s): "AST", "ALT", "ALKPHOS", "BILITOT", "PROT", "ALBUMIN" in the last 168 hours. No results for input(s): "LIPASE", "AMYLASE" in the  last 168 hours. No results for input(s): "AMMONIA" in the last 168 hours. Coagulation profile No results for input(s): "INR", "PROTIME" in the last 168 hours.  CBC: Recent Labs  Lab 07/15/23 0323  WBC 9.0  HGB 15.0  HCT 45.0  MCV 96.6  PLT 203   Cardiac Enzymes: No results for input(s): "CKTOTAL", "CKMB", "CKMBINDEX", "TROPONINI" in the last 168 hours. BNP (last 3 results) No results for input(s): "PROBNP" in the last 8760 hours. CBG: No results for input(s): "GLUCAP" in the last 168 hours. D-Dimer: No results for input(s): "DDIMER" in the last 72 hours. Hgb A1c: No results for input(s): "HGBA1C" in the last 72 hours. Lipid Profile: No results for input(s): "CHOL", "HDL", "LDLCALC", "TRIG", "CHOLHDL", "LDLDIRECT" in the last 72 hours. Thyroid function studies: No results for input(s): "TSH", "T4TOTAL", "T3FREE", "THYROIDAB" in the last 72 hours.  Invalid input(s): "FREET3" Anemia work up: No results for input(s): "VITAMINB12", "FOLATE", "FERRITIN", "TIBC", "IRON", "RETICCTPCT" in the last 72 hours. Sepsis Labs: Recent Labs  Lab 07/15/23 0323  WBC 9.0    Microbiology Recent Results (from the past 240 hour(s))  SARS Coronavirus 2 by RT PCR (hospital order, performed in [facility] hospital lab) *cepheid single result test* Anterior Nasal Swab     Status: None   Collection Time: 07/15/23 10:11 AM   Specimen: Anterior Nasal Swab  Result Value Ref Range Status   SARS Coronavirus 2 by RT PCR NEGATIVE NEGATIVE Final    Comment: (NOTE) SARS-CoV-2 target nucleic acids are NOT DETECTED.  The SARS-CoV-2 RNA is generally detectable in upper and lower respiratory specimens during the acute phase of infection. The lowest concentration of SARS-CoV-2 viral copies this assay can detect is 250 copies / mL. A negative result does not preclude SARS-CoV-2 infection and should not be used as the sole basis for treatment or other patient management decisions.  A negative result may  occur with improper specimen collection / handling, submission of specimen other than nasopharyngeal swab, presence of viral mutation(s) within the areas targeted by this assay, and inadequate number of viral copies (<250 copies / mL). A negative result must be combined with clinical observations, patient history, and epidemiological information.  Fact Sheet for Patients:   RoadLapTop.co.za  Fact Sheet for Healthcare Providers: http://kim-miller.com/  This test is not yet approved or  cleared by the [place] FDA and has been authorized for detection and/or diagnosis of SARS-CoV-2 by FDA under an Emergency Use Authorization (EUA).  This EUA will remain in effect (meaning this test can be used) for the duration of the COVID-19 declaration under  Section 564(b)(1) of the Act, 21 U.S.C. section 360bbb-3(b)(1), unless the authorization is terminated or revoked sooner.  Performed at [facility], [address] [place], [place] [address]     Procedures and diagnostic studies:  No results found.             LOS: 3 days   Kellon Chalk  [facility]   Pager on [email or web address]. If 7PM-7AM, please contact night-coverage at [email or web address]     07/18/2023, 5:35 PM

## 2023-07-19 DIAGNOSIS — F332 Major depressive disorder, recurrent severe without psychotic features: Secondary | ICD-10-CM | POA: Diagnosis not present

## 2023-07-19 DIAGNOSIS — F1410 Cocaine abuse, uncomplicated: Secondary | ICD-10-CM | POA: Diagnosis not present

## 2023-07-19 NOTE — Progress Notes (Signed)
[facility] MD Progress Note  07/19/2023  [name].  MRN:  [id number]   Coyle Herpin. Holbert, Bentley. is a 62 year old male with past psychiatric history of anxiety disorder, polysubstance use (EtOH, alcohol, marijuana, cocaine, barbiturates, tobacco), and alcohol induced mood disorder. Mr. Nieblas initially presented to [facility] ED after a fall, he was seen, and ultimately discharged to the ED lobby. Once in the lobby he reported thoughts of wanting to harm himself.   Subjective: Chart reviewed, case discussed with staff today, patient seen during rounds.  Patient mental status since yesterday.  Although patient has reported to staff that headache is getting better.  Patient continues to report depressed mood and anhedonia.  Reportedly patient stays in his room for most of the day.  Patient was encouraged to sit in the area and interact with peers.  Patient was also encouraged to participate in milieu and attend groups.  Patient reports that he is concerned about his headaches.  Patient was encouraged to keep taking medicines prescribed by hospitalist.  Patient was provided with support and reassurance.   He endorses suicidal thoughts, denies any intention to harm himself on the unit.  He denies psychotic or manic symptoms.  Principal Problem: MDD (major depressive disorder) Diagnosis: Principal Problem:   MDD (major depressive disorder) Active Problems:   MDD (major depressive disorder), recurrent severe, without psychosis (HCC)   Cocaine abuse (HCC)   Headache   Past Psychiatric History: Patient reports remote history of depression. Patient reports he is not treatment for depression. He denies past history of inpatient psychiatric treatment.   Past Medical History:  Past Medical History:  Diagnosis Date   Alcohol intoxication (HCC)    Anticoagulant long-term use    lifetime use, coumadin therapy, then Xarelto   Anxiety    Arthritis    Cancer (HCC)    renal   Carotid atherosclerosis     <50% left and right   Clotting disorder (HCC)    COPD (chronic obstructive pulmonary disease) (HCC)    Depression    Elevated liver enzymes 11/2013   Family history of cancer    Fatty liver    H/O drug abuse (HCC)    H/O ETOH abuse    Hepatitis    History of traumatic brain injury    Hypertension    Lymphadenopathy    Obesity    Personal history of DVT (deep vein thrombosis) 06/2013   Pre-diabetes    Primary osteoarthritis of left knee    Pulmonary embolism and infarction (HCC) 06/2013   Pulmonary embolism and infarction (HCC) 11/2013   Pulmonary embolus with infarction [facility])    Renal cell carcinoma (HCC)    Renal mass, left 06/2013   with ureteral obstruction   Stroke (HCC)    Subarachnoid hemorrhage following injury (HCC)    Thrombocythemia    Thrombocytopenia (HCC)    Tobacco abuse     Past Surgical History:  Procedure Laterality Date   ANKLE FRACTURE SURGERY Right 12/2012   ANKLE SURGERY     EXCISION METACARPAL MASS Right 09/18/2016   Procedure: EXCISION METACARPAL MASS;  Surgeon: Kennedy Bucker, MD;  Location: [facility] ORS;  Service: Orthopedics;  Laterality: Right;  5th digit    HERNIA REPAIR     Umbilical Hernia   ORBITAL FRACTURE SURGERY Left    ROBOTIC ASSITED PARTIAL NEPHRECTOMY Left Oct 2014   Family History:  Family History  Problem Relation Age of Onset   Pancreatic cancer Mother    Colon cancer  Father    Diabetes Father    Colon cancer Sister    COPD Sister    Seizures Sister    Ovarian cancer Sister    Cancer Paternal Grandmother        bone   Stroke Paternal Grandfather    Heart disease Paternal Grandfather    Hypertension Neg Hx     Social History:  Social History   Substance and Sexual Activity  Alcohol Use Yes   Comment: occasionally     Social History   Substance and Sexual Activity  Drug Use Yes   Types: Marijuana   Comment: occ    Social History   Socioeconomic History   Marital status: Divorced    Spouse name: Not on file    Number of children: Not on file   Years of education: Not on file   Highest education level: Not on file  Occupational History   Not on file  Tobacco Use   Smoking status: Every Day    Current packs/day: 0.50    Types: Cigarettes   Smokeless tobacco: Never  Vaping Use   Vaping status: Never Used  Substance and Sexual Activity   Alcohol use: Yes    Comment: occasionally   Drug use: Yes    Types: Marijuana    Comment: occ   Sexual activity: Yes  Other Topics Concern   Not on file  Social History Narrative   ** Merged History Encounter **       Social Determinants of Health   Financial Resource Strain: Not on file  Food Insecurity: Food Insecurity Present (07/15/2023)   Hunger Vital Sign    Worried About Running Out of Food in the Last Year: Sometimes true    Ran Out of Food in the Last Year: Sometimes true  Transportation Needs: Unmet Transportation Needs (07/15/2023)   PRAPARE - Administrator, Civil Service (Medical): Yes    Lack of Transportation (Non-Medical): Yes  Physical Activity: Not on file  Stress: Not on file  Social Connections: Not on file   Additional Social History:   Patient reports he has an apartment he states in.  Patient reports he gets $1700/month in disability                      Sleep: Poor  Appetite:  Poor  Current Medications: Current Facility-Administered Medications  Medication Dose Route Frequency Provider Last Rate Last Admin   alum & mag hydroxide-simeth (MAALOX/MYLANTA) 200-200-20 MG/5ML suspension 30 mL  30 mL Oral Q4H PRN Lauree Chandler, NP       busPIRone (BUSPAR) tablet 5 mg  5 mg Oral BID Lewanda Rife, MD   5 mg at 07/19/23 1024   butalbital-acetaminophen-caffeine (FIORICET) 50-325-40 MG per tablet 1 tablet  1 tablet Oral Q6H PRN Mikey College T, MD   1 tablet at 07/18/23 2136   diphenhydrAMINE (BENADRYL) capsule 50 mg  50 mg Oral TID PRN Lauree Chandler, NP       Or   diphenhydrAMINE (BENADRYL)  injection 50 mg  50 mg Intramuscular TID PRN Lauree Chandler, NP       docusate sodium (COLACE) capsule 100 mg  100 mg Oral BID Lewanda Rife, MD   100 mg at 07/19/23 1025   feeding supplement (ENSURE ENLIVE / ENSURE PLUS) liquid 237 mL  237 mL Oral BID BM Sarina Ill, DO   237 mL at 07/19/23 1026   haloperidol (HALDOL) tablet 5  mg  5 mg Oral TID PRN Lauree Chandler, NP       Or   haloperidol lactate (HALDOL) injection 5 mg  5 mg Intramuscular TID PRN Lauree Chandler, NP       hydrOXYzine (ATARAX) tablet 25 mg  25 mg Oral TID PRN Lauree Chandler, NP   25 mg at 07/17/23 2114   LORazepam (ATIVAN) tablet 2 mg  2 mg Oral TID PRN Lauree Chandler, NP       Or   LORazepam (ATIVAN) injection 2 mg  2 mg Intramuscular TID PRN Lauree Chandler, NP       magnesium hydroxide (MILK OF MAGNESIA) suspension 30 mL  30 mL Oral Daily PRN Lauree Chandler, NP       meclizine (ANTIVERT) tablet 12.5 mg  12.5 mg Oral TID PRN Mikey College T, MD   12.5 mg at 07/18/23 0957   melatonin tablet 5 mg  5 mg Oral QHS PRN Lewanda Rife, MD   5 mg at 07/18/23 2136   multivitamin with minerals tablet 1 tablet  1 tablet Oral Daily Sarina Ill, DO   1 tablet at 07/19/23 1024   nicotine (NICODERM CQ - dosed in mg/24 hours) patch 14 mg  14 mg Transdermal Daily Lewanda Rife, MD   14 mg at 07/19/23 1026   niMODipine (NIMOTOP) capsule 60 mg  60 mg Oral Q4H Mikey College T, MD   60 mg at 07/19/23 1233   sertraline (ZOLOFT) tablet 25 mg  25 mg Oral Daily Lauree Chandler, NP   25 mg at 07/19/23 1024    Lab Results: No results found for this or any previous visit (from the past 48 hour(s)).  Blood Alcohol level:  Lab Results  Component Value Date   ETH <10 07/15/2023   ETH 250 (H) 01/13/2023    Metabolic Disorder Labs: Lab Results  Component Value Date   HGBA1C 5.6 08/20/2021   MPG 114 08/20/2021   MPG 146 09/27/2018   No results found for: "PROLACTIN" Lab  Results  Component Value Date   CHOL 169 09/19/2021   TRIG 307 (H) 09/19/2021   HDL 49 09/19/2021   CHOLHDL 3.4 09/19/2021   VLDL NOT CALC 08/07/2016   LDLCALC 83 09/19/2021   LDLCALC 98 09/27/2018         Musculoskeletal: Strength & Muscle Tone: within normal limits Gait & Station: normal Patient leans: N/A   Psychiatric Specialty Exam:   Presentation  General Appearance:  Improved   Eye Contact: Fair   Speech: Normal Rate   Speech Volume: Normal  Handedness: Right     Mood and Affect  Mood: Depressed   Affect: Constricted, not tearful today     Thought Process  Thought Processes: Coherent   Descriptions of Associations:Intact   Orientation:Full (Time, Place and Person)   Thought Content: Improved   History of Schizophrenia/Schizoaffective disorder:No   Duration of Psychotic Symptoms:N/A   Hallucinations:Hallucinations: None   Ideas of Reference:None   Suicidal Thoughts:Suicidal Thoughts: Yes, Active SI Active Intent and/or Plan: With Plan (Reports he would step in front of a train) SI Passive Intent and/or Plan: -- (States he will jump in front of train)   Homicidal Thoughts:Homicidal Thoughts: No     Sensorium  Memory: Immediate Fair; Recent Poor; Remote Poor   Judgment: Fair   Insight: Fair     Chartered certified accountant: Fair   Attention Span: Good     Fund of Knowledge:  Fair   Language: Good     Psychomotor Activity  Psychomotor Activity: Psychomotor Activity: Normal     Assets  Assets: Communication Skills; Financial Resources/Insurance; Housing     Sleep  Sleep: Sleep: Fair       Physical Exam: Physical Exam Vitals and nursing note reviewed.  HENT:     Head: Normocephalic and atraumatic.  Cardiovascular:     Rate and Rhythm: Normal rate.  Pulmonary:     Effort: Pulmonary effort is normal.  Neurological:     General: No focal deficit present.     Mental Status: He is alert and  oriented to person, place, and time.      Review of Systems  Constitutional:  Negative for chills and fever.  HENT:  Positive for hearing loss.   Eyes:  Negative for blurred vision.  Respiratory:  Negative for shortness of breath.   Cardiovascular:  Negative for chest pain and palpitations.  Gastrointestinal:  Negative for nausea.  Neurological:  Positive for headaches.  Psychiatric/Behavioral:  Positive for suicidal ideas.    Blood pressure 121/77, pulse 74, temperature 98.1 F (36.7 C), resp. rate 18, height 5\' 7"  (1.702 m), weight 73.7 kg, SpO2 98%. Body mass index is 25.45 kg/m.   Treatment Plan Summary: Daily contact with patient to assess and evaluate symptoms and progress in treatment and Medication management  Patient is admitted to locked unit under safety precautions Patient agrees to try BuSpar for anxiety and Zoloft for depression.  Side effect of both medicines including GI effects and dizziness discussed with the patient. Continue on Zoloft 25 mg by mouth daily Continue on BuSpar 5 mg by mouth daily Patient was encouraged to attend group and work on a safe discharge plan Patient was encouraged to abstain from illicit drugs and alcohol Hospitalist consulted to follow-up upon patient's headache and TBI Patient is followed by hospitalist team for headache  Lewanda Rife, MD

## 2023-07-19 NOTE — Progress Notes (Signed)
D- Patient alert and oriented. Flat affect. Endorses anxiety and depression. C/o difficulty sleeping and H/A. Pain scale 5/10. Denies SI, HI, AVH.   A- Scheduled medications administered to patient, per MD orders. Pain mgmt regime maintained. PRN given for insomnia. Support and encouragement provided.  Routine safety checks conducted every 15 minutes.  Patient informed to notify staff with problems or concerns. R- No adverse drug reactions noted. Patient contracts for safety at this time. Patient compliant with medications and treatment plan. Patient receptive, calm, and cooperative. Patient interacts well with others on the unit.  Patient remains safe at this time.

## 2023-07-19 NOTE — Progress Notes (Signed)
Pt is alert and oriented at this time. Pt reported anxiety and depression which he rated a 10/10 . He denies SI/HI/AVH did not come to visit her.   Pt received scheduled medications as per MD orders. Support and encouragement provided . Frequent verbal made . Routine safety checks conducted q15.   No adverse drug reactions noted. Pt verbally contracts for safety at this time. Pt is complaint with medications and treatment pan. Pt interacts well with others on the unit. Pt remains safe at this time, Plan of care ongoing.

## 2023-07-19 NOTE — Group Note (Signed)
LCSW Group Therapy Note   Group Date: 07/19/2023 Start Time: 1240 End Time: 1330   Type of Therapy and Topic:  Group Therapy: Self-Care  Participation Level:  Did Not Attend    Summary of Patient Progress:  The patient did not attend group.     Marshell Levan, LCSWA 07/19/2023  3:28 PM

## 2023-07-19 NOTE — Group Note (Signed)
Date:  07/19/2023 Time:  4:59 PM  Group Topic/Focus:  Activity Group:  The focus of the group is to promote activity for the patients and encourage them to go outside to the courtyard and get some fresh air and some exercise.    Participation Level:  Active  Participation Quality:  Appropriate  Affect:  Appropriate  Cognitive:  Appropriate  Insight: Appropriate  Engagement in Group:  Engaged  Modes of Intervention:  Activity  Additional Comments:    Mary Sella Leeta Grimme 07/19/2023, 4:59 PM

## 2023-07-19 NOTE — Progress Notes (Signed)
   07/19/23 0454  15 Minute Checks  Location Bedroom  Visual Appearance Calm  Behavior Sleeping  Sleep (Behavioral Health Patients Only)  Calculate sleep? (Click Yes once per 24 hr at 0600 safety check) Yes  Documented sleep last 24 hours 8.75

## 2023-07-19 NOTE — Plan of Care (Signed)
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
## 2023-07-19 NOTE — Progress Notes (Signed)
Patient is admitted to [facility] for MDD. Patient c/o head pain d/t subdural hematoma for fall for which he is receiving medication for swelling.  C/O  depression and anxiety on his "situation" but does not elaborate. Was medicated with prn atarax at pt's request.  Ambulates with walker. Will continue to monitor.

## 2023-07-20 DIAGNOSIS — F1410 Cocaine abuse, uncomplicated: Secondary | ICD-10-CM | POA: Diagnosis not present

## 2023-07-20 DIAGNOSIS — F332 Major depressive disorder, recurrent severe without psychotic features: Secondary | ICD-10-CM | POA: Diagnosis not present

## 2023-07-20 NOTE — Group Note (Signed)
Recreation Therapy Group Note   Group Topic:Emotion Expression  Group Date: 07/20/2023 Start Time: 1400 End Time: 1455 Facilitators: Rosina Lowenstein, LRT, CTRS Location: Courtyard  Group Description: Music Reminisce. LRT encouraged patients to think of their favorite song(s) that reminded them of a positive memory or time in their life. LRT encouraged patient to talk about that memory aloud to the group. LRT played the song through a speaker for all to hear. LRT and patients discussed how thinking of a positive memory or time in their life can be used as a coping skill in everyday life post discharge.   Goal Area(s) Addressed: Patient will increase verbal communication by conversing with peers. Patient will contribute to group discussion with minimal prompting. Patient will reminisce a positive memory or moment in their life.    Affect/Mood: Appropriate   Participation Level: Active and Engaged   Participation Quality: Independent   Behavior: Calm and Cooperative   Speech/Thought Process: Coherent   Insight: Good   Judgement: Good   Modes of Intervention: Activity   Patient Response to Interventions:  Attentive, Engaged, Interested , and Receptive   Education Outcome:  Acknowledges education   Clinical Observations/Individualized Feedback: Randall Harvey was active in their participation of session activities and group discussion. Pt identified "Faithfully by Journey". Pt shared that he thinks of riding around in his truck that he had for 23 years. Pt then shared about his son and their love of trucks. Pt interacted with LRT and peers duration of session.   Plan: Continue to engage patient in RT group sessions 2-3x/week.   Rosina Lowenstein, LRT, CTRS 07/20/2023 3:18 PM

## 2023-07-20 NOTE — [facility] Suicide Risk Assessment (Signed)
[facility] INPATIENT:  Family/Significant Other Suicide Prevention Education  Suicide Prevention Education:  Education Completed; Jola Schmidt, sister, [phone], has been identified by the patient as the family member/significant other with whom the patient will be residing, and identified as the person(s) who will aid the patient in the event of a mental health crisis (suicidal ideations/suicide attempt).  With written consent from the patient, the family member/significant other has been provided the following suicide prevention education, prior to the and/or following the discharge of the patient.  The suicide prevention education provided includes the following: Suicide risk factors Suicide prevention and interventions National Suicide Hotline telephone number [facility] assessment telephone number [facility] Emergency Assistance 911 [facility] and/or Residential Mobile Crisis Unit telephone number  Request made of family/significant other to: Remove weapons (e.g., guns, rifles, knives), all items previously/currently identified as safety concern.   Remove drugs/medications (over-the-counter, prescriptions, illicit drugs), all items previously/currently identified as a safety concern.  The family member/significant other verbalizes understanding of the suicide prevention education information provided.  The family member/significant other agrees to remove the items of safety concern listed above.  Elza Rafter 07/20/2023, 3:32 PM

## 2023-07-20 NOTE — Progress Notes (Signed)
D: Pt alert and oriented. Pt denies experiencing any anxiety/depression at time of assessment. Pt headache pain 8/10. Pt denies experiencing any SI/HI, or AVH at this time.      A: Scheduled medications administered to pt, per MD orders. Support and encouragement provided. Frequent verbal contact made. Routine safety checks conducted q15 minutes.   R: No adverse drug reactions noted. Pt complaint with medications. Pt interacts appropriately with others on the unit. Pt remains safe at this time. Will continue plan of care.

## 2023-07-20 NOTE — Progress Notes (Signed)
[facility] MD Progress Note  07/20/2023  Randall Harvey.  MRN:  [id number]   Randall Harvey. Randall Harvey, [name]. is a 62 year old male with past psychiatric history of anxiety disorder, polysubstance use (EtOH, alcohol, marijuana, cocaine, barbiturates, tobacco), and alcohol induced mood disorder. Randall Harvey initially presented to [facility] regional ED after a fall, he was seen, and ultimately discharged to the ED lobby. Once in the lobby he reported thoughts of wanting to harm himself.   Subjective: Chart reviewed, case discussed with staff in multidisciplinary meeting today, patient seen during rounds.  Patient reports his pain is 8 out of 10 today. Patient continues to report depressed mood and anhedonia.  Patient has attended few groups.  He is more visible in the area.  Patient is in need of constant reassurance.  Patient was encouraged to keep taking medicines prescribed by hospitalist.  He endorses suicidal thoughts, he reports" I will not do anything here, but if I go out I will jump in front of a train".  He denies psychotic or manic symptoms.  Principal Problem: MDD (major depressive disorder) Diagnosis: Principal Problem:   MDD (major depressive disorder) Active Problems:   MDD (major depressive disorder), recurrent severe, without psychosis (HCC)   Cocaine abuse (HCC)   Headache   Past Psychiatric History: Patient reports remote history of depression. Patient reports he is not treatment for depression. He denies past history of inpatient psychiatric treatment.   Past Medical History:  Past Medical History:  Diagnosis Date   Alcohol intoxication (HCC)    Anticoagulant long-term use    lifetime use, coumadin therapy, then Xarelto   Anxiety    Arthritis    Cancer (HCC)    renal   Carotid atherosclerosis    <50% left and right   Clotting disorder (HCC)    COPD (chronic obstructive pulmonary disease) (HCC)    Depression    Elevated liver enzymes 11/2013   Family history of cancer    Fatty liver     H/O drug abuse (HCC)    H/O ETOH abuse    Hepatitis    History of traumatic brain injury    Hypertension    Lymphadenopathy    Obesity    Personal history of DVT (deep vein thrombosis) 06/2013   Pre-diabetes    Primary osteoarthritis of left knee    Pulmonary embolism and infarction (HCC) 06/2013   Pulmonary embolism and infarction (HCC) 11/2013   Pulmonary embolus with infarction [facility])    Renal cell carcinoma (HCC)    Renal mass, left 06/2013   with ureteral obstruction   Stroke (HCC)    Subarachnoid hemorrhage following injury (HCC)    Thrombocythemia    Thrombocytopenia (HCC)    Tobacco abuse     Past Surgical History:  Procedure Laterality Date   ANKLE FRACTURE SURGERY Right 12/2012   ANKLE SURGERY     EXCISION METACARPAL MASS Right 09/18/2016   Procedure: EXCISION METACARPAL MASS;  Surgeon: Kennedy Bucker, MD;  Location: [facility] ORS;  Service: Orthopedics;  Laterality: Right;  5th digit    HERNIA REPAIR     Umbilical Hernia   ORBITAL FRACTURE SURGERY Left    ROBOTIC ASSITED PARTIAL NEPHRECTOMY Left Oct 2014   Family History:  Family History  Problem Relation Age of Onset   Pancreatic cancer Mother    Colon cancer Father    Diabetes Father    Colon cancer Sister    COPD Sister    Seizures Sister    Ovarian cancer  Sister    Cancer Paternal Grandmother        bone   Stroke Paternal Grandfather    Heart disease Paternal Grandfather    Hypertension Neg Hx     Social History:  Social History   Substance and Sexual Activity  Alcohol Use Yes   Comment: occasionally     Social History   Substance and Sexual Activity  Drug Use Yes   Types: Marijuana   Comment: occ    Social History   Socioeconomic History   Marital status: Divorced    Spouse name: Not on file   Number of children: Not on file   Years of education: Not on file   Highest education level: Not on file  Occupational History   Not on file  Tobacco Use   Smoking status: Every Day    Current  packs/day: 0.50    Types: Cigarettes   Smokeless tobacco: Never  Vaping Use   Vaping status: Never Used  Substance and Sexual Activity   Alcohol use: Yes    Comment: occasionally   Drug use: Yes    Types: Marijuana    Comment: occ   Sexual activity: Yes  Other Topics Concern   Not on file  Social History Narrative   ** Merged History Encounter **       Social Determinants of Health   Financial Resource Strain: Not on file  Food Insecurity: Food Insecurity Present (07/15/2023)   Hunger Vital Sign    Worried About Running Out of Food in the Last Year: Sometimes true    Ran Out of Food in the Last Year: Sometimes true  Transportation Needs: Unmet Transportation Needs (07/15/2023)   PRAPARE - Administrator, Civil Service (Medical): Yes    Lack of Transportation (Non-Medical): Yes  Physical Activity: Not on file  Stress: Not on file  Social Connections: Not on file   Additional Social History:   Patient reports he has an apartment he states in.  Patient reports he gets $1700/month in disability                      Sleep: Poor  Appetite: Fair  Current Medications: Current Facility-Administered Medications  Medication Dose Route Frequency Provider Last Rate Last Admin   alum & mag hydroxide-simeth (MAALOX/MYLANTA) 200-200-20 MG/5ML suspension 30 mL  30 mL Oral Q4H PRN Lauree Chandler, NP       busPIRone (BUSPAR) tablet 5 mg  5 mg Oral BID Lewanda Rife, MD   5 mg at 07/20/23 1021   butalbital-acetaminophen-caffeine (FIORICET) 50-325-40 MG per tablet 1 tablet  1 tablet Oral Q6H PRN Emeline General, MD   1 tablet at 07/20/23 0701   diphenhydrAMINE (BENADRYL) capsule 50 mg  50 mg Oral TID PRN Lauree Chandler, NP       Or   diphenhydrAMINE (BENADRYL) injection 50 mg  50 mg Intramuscular TID PRN Lauree Chandler, NP       docusate sodium (COLACE) capsule 100 mg  100 mg Oral BID Lewanda Rife, MD   100 mg at 07/20/23 1021   feeding  supplement (ENSURE ENLIVE / ENSURE PLUS) liquid 237 mL  237 mL Oral BID BM Sarina Ill, DO   237 mL at 07/20/23 1040   haloperidol (HALDOL) tablet 5 mg  5 mg Oral TID PRN Lauree Chandler, NP       Or   haloperidol lactate (HALDOL) injection 5 mg  5  mg Intramuscular TID PRN Lauree Chandler, NP       hydrOXYzine (ATARAX) tablet 25 mg  25 mg Oral TID PRN Lauree Chandler, NP   25 mg at 07/19/23 1551   LORazepam (ATIVAN) tablet 2 mg  2 mg Oral TID PRN Lauree Chandler, NP       Or   LORazepam (ATIVAN) injection 2 mg  2 mg Intramuscular TID PRN Lauree Chandler, NP       magnesium hydroxide (MILK OF MAGNESIA) suspension 30 mL  30 mL Oral Daily PRN Lauree Chandler, NP       meclizine (ANTIVERT) tablet 12.5 mg  12.5 mg Oral TID PRN Mikey College T, MD   12.5 mg at 07/18/23 0957   melatonin tablet 5 mg  5 mg Oral QHS PRN Lewanda Rife, MD   5 mg at 07/19/23 2106   multivitamin with minerals tablet 1 tablet  1 tablet Oral Daily Sarina Ill, DO   1 tablet at 07/20/23 1021   nicotine (NICODERM CQ - dosed in mg/24 hours) patch 14 mg  14 mg Transdermal Daily Lewanda Rife, MD   14 mg at 07/20/23 1022   niMODipine (NIMOTOP) capsule 60 mg  60 mg Oral Q4H Mikey College T, MD   60 mg at 07/20/23 1200   sertraline (ZOLOFT) tablet 25 mg  25 mg Oral Daily Lauree Chandler, NP   25 mg at 07/20/23 1021    Lab Results: No results found for this or any previous visit (from the past 48 hour(s)).  Blood Alcohol level:  Lab Results  Component Value Date   ETH <10 07/15/2023   ETH 250 (H) 01/13/2023    Metabolic Disorder Labs: Lab Results  Component Value Date   HGBA1C 5.6 08/20/2021   MPG 114 08/20/2021   MPG 146 09/27/2018   No results found for: "PROLACTIN" Lab Results  Component Value Date   CHOL 169 09/19/2021   TRIG 307 (H) 09/19/2021   HDL 49 09/19/2021   CHOLHDL 3.4 09/19/2021   VLDL NOT CALC 08/07/2016   LDLCALC 83 09/19/2021   LDLCALC 98  09/27/2018         Musculoskeletal: Strength & Muscle Tone: within normal limits Gait & Station: normal Patient leans: N/A   Psychiatric Specialty Exam:   Presentation  General Appearance:  Improved   Eye Contact: Fair   Speech: Normal Rate   Speech Volume: Normal  Handedness: Right     Mood and Affect  Mood: Depressed   Affect: Constricted, not tearful today     Thought Process  Thought Processes: Coherent   Descriptions of Associations:Intact   Orientation:Full (Time, Place and Person)   Thought Content: Improved   History of Schizophrenia/Schizoaffective disorder:No   Duration of Psychotic Symptoms:N/A   Hallucinations:Hallucinations: None   Ideas of Reference:None   Suicidal Thoughts:Suicidal Thoughts: Yes, Active SI Active Intent and/or Plan: With Plan (Reports he would step in front of a train) SI Passive Intent and/or Plan: -- (States he will jump in front of train)   Homicidal Thoughts:Homicidal Thoughts: No     Sensorium  Memory: Immediate Fair; Recent Poor; Remote Poor   Judgment: Limited   Insight: Fair     Chartered certified accountant: Fair   Attention Span: Chief Strategy Officer of Knowledge: Fair   Language: Good     Psychomotor Activity  Psychomotor Activity: Psychomotor Activity: Normal     Assets  Assets: Communication Skills; Financial Resources/Insurance;  Housing     Sleep  Sleep: Sleep: Fair       Physical Exam: Physical Exam Vitals and nursing note reviewed.  HENT:     Head: Normocephalic and atraumatic.  Cardiovascular:     Rate and Rhythm: Normal rate.  Pulmonary:     Effort: Pulmonary effort is normal.  Neurological:     General: No focal deficit present.     Mental Status: He is alert and oriented to person, place, and time.      Review of Systems  Constitutional:  Negative for chills and fever.  HENT:  Positive for hearing loss.   Eyes:  Negative for blurred vision.   Respiratory:  Negative for shortness of breath.   Cardiovascular:  Negative for chest pain and palpitations.  Gastrointestinal:  Negative for nausea.  Neurological:  Positive for headaches.  Psychiatric/Behavioral:  Positive for suicidal ideas.    Blood pressure (!) 113/57, pulse 64, temperature 97.9 F (36.6 C), resp. rate 18, height 5\' 7"  (1.702 m), weight 73.7 kg, SpO2 97%. Body mass index is 25.45 kg/m.   Treatment Plan Summary: Daily contact with patient to assess and evaluate symptoms and progress in treatment and Medication management  Patient is admitted to locked unit under safety precautions Patient agrees to try BuSpar for anxiety and Zoloft for depression.  Side effect of both medicines including GI effects and dizziness discussed with the patient. Increase the dose of Zoloft to 50 mg by mouth daily Continue on BuSpar 5 mg by mouth daily Patient was encouraged to attend group and work on a safe discharge plan Patient was encouraged to abstain from illicit drugs and alcohol Hospitalist consulted to follow-up upon patient's headache and TBI Patient is followed by hospitalist team for headache  Lewanda Rife, MD

## 2023-07-20 NOTE — Group Note (Signed)
Date:  07/20/2023 Time:  10:46 PM  Group Topic/Focus:  Wrap-Up Group:   The focus of this group is to help patients review their daily goal of treatment and discuss progress on daily workbooks.    Participation Level:  Minimal  Participation Quality:  Appropriate  Affect:  Appropriate  Cognitive:  Alert and Appropriate  Insight: Good  Engagement in Group:  Improving and Supportive  Modes of Intervention:  Discussion and Support  Additional Comments:     Maglione,Zaydah Nawabi E 07/20/2023, 10:46 PM

## 2023-07-20 NOTE — Group Note (Signed)
Date:  07/20/2023 Time:  3:49 AM  Group Topic/Focus:  Recovery Goals:   The focus of this group is to identify appropriate goals for recovery and establish a plan to achieve them.    Participation Level:  Active  Participation Quality:  Appropriate  Affect:  Appropriate  Cognitive:  Appropriate  Insight: Appropriate  Engagement in Group:  Engaged  Modes of Intervention:  Discussion  Additional Comments:    Maeola Harman 07/20/2023, 3:49 AM

## 2023-07-20 NOTE — Plan of Care (Signed)
Problem: Education: Goal: Knowledge of General Education information will improve Description Including pain rating scale, medication(s)/side effects and non-pharmacologic comfort measures Outcome: Progressing   Problem: Nutrition: Goal: Adequate nutrition will be maintained Outcome: Progressing   Problem: Coping: Goal: Level of anxiety will decrease Outcome: Progressing   Problem: Skin Integrity: Goal: Risk for impaired skin integrity will decrease Outcome: Progressing   
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
## 2023-07-20 NOTE — Progress Notes (Signed)
Patient present in the dayroom for breakfast.  Sad affect. Pleasant and talkative with this Clinical research associate. Endorses anxiety, depression and sadness. Tearful during assessment. Denies SI while in the hospital, but states he is scared to leave.  Denies HI and AVH.    Compliant with scheduled medications.  15 min checks in place for safety.  Present in the milieu this afternoon.  Appropriate interaction with peers and staff.    Patient is preoccupied with staying in the hospital for 120 days.  He does not want to be discharged before his headache and dizziness is resolved.

## 2023-07-20 NOTE — Plan of Care (Signed)

## 2023-07-21 DIAGNOSIS — F332 Major depressive disorder, recurrent severe without psychotic features: Secondary | ICD-10-CM | POA: Diagnosis not present

## 2023-07-21 MED ORDER — SERTRALINE HCL 50 MG PO TABS
50.0000 mg | ORAL_TABLET | Freq: Every day | ORAL | Status: DC
  Administered 2023-07-22 – 2023-07-28 (×7): 50 mg via ORAL
  Filled 2023-07-21 (×7): qty 1

## 2023-07-21 MED ORDER — MIRTAZAPINE 15 MG PO TABS
15.0000 mg | ORAL_TABLET | Freq: Every day | ORAL | Status: DC
  Administered 2023-07-21 – 2023-07-27 (×7): 15 mg via ORAL
  Filled 2023-07-21 (×7): qty 1

## 2023-07-21 NOTE — Group Note (Signed)
[facility] LCSW Group Therapy Note   Group Date: 07/21/2023 Start Time: 1315 End Time: 1400   Type of Therapy/Topic:  Group Therapy:  Emotion Regulation  Participation Level:  Minimal   Mood:  Description of Group:    The purpose of this group is to assist patients in learning to regulate negative emotions and experience positive emotions. Patients will be guided to discuss ways in which they have been vulnerable to their negative emotions. These vulnerabilities will be juxtaposed with experiences of positive emotions or situations, and patients challenged to use positive emotions to combat negative ones. Special emphasis will be placed on coping with negative emotions in conflict situations, and patients will process healthy conflict resolution skills.  Therapeutic Goals: Patient will identify two positive emotions or experiences to reflect on in order to balance out negative emotions:  Patient will label two or more emotions that they find the most difficult to experience:  Patient will be able to demonstrate positive conflict resolution skills through discussion or role plays:   Summary of Patient Progress:   Pt participated throughout group and was appropriate throughout.      Therapeutic Modalities:   Cognitive Behavioral Therapy Feelings Identification Dialectical Behavioral Therapy   Elza Rafter, LCSWA

## 2023-07-21 NOTE — Progress Notes (Signed)
Patient pleasant and cooperative.  Sad affect.  Endorses depression, anxiety and hopelessness. Denies SI/HI and AVH. Pain rated 9/10 in head. Endorses dizziness. Patient voices his physical symptoms have not subsided.   Compliant with scheduled medications.  15 min checks in place for safety.  Patient present in the milieu.  Appropriate interaction with peers. Participated in group activities.  PRN medications given for pain and dizziness.

## 2023-07-21 NOTE — Group Note (Signed)
Date:  07/21/2023 Time:  6:35 PM  Group Topic/Focus:  Healthy Communication:   The focus of this group is to discuss communication, barriers to communication, as well as healthy ways to communicate with others.    Participation Level:  Active  Participation Quality:  Appropriate  Affect:  Appropriate  Cognitive:  Alert and Appropriate  Insight: Appropriate  Engagement in Group:  Developing/Improving and Engaged  Modes of Intervention:  Activity, Discussion, and Socialization  Additional Comments:    Rosaura Carpenter 07/21/2023, 6:35 PM

## 2023-07-21 NOTE — Plan of Care (Signed)
  Problem: Education: Goal: Knowledge of General Education information will improve Description: Including pain rating scale, medication(s)/side effects and non-pharmacologic comfort measures Outcome: Progressing   Problem: Safety: Goal: Ability to remain free from injury will improve Outcome: Progressing   Problem: Pain Managment: Goal: General experience of comfort will improve Outcome: Not Progressing
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
## 2023-07-22 DIAGNOSIS — F332 Major depressive disorder, recurrent severe without psychotic features: Secondary | ICD-10-CM | POA: Diagnosis not present

## 2023-07-22 NOTE — Progress Notes (Signed)
   07/22/23 1253  Psych Admission Type (Psych Patients Only)  Admission Status Voluntary  Psychosocial Assessment  Patient Complaints None  Eye Contact Fair  Facial Expression Flat  Affect Flat  Speech Logical/coherent  Interaction Assertive  Motor Activity Slow  Appearance/Hygiene In scrubs  Behavior Characteristics Cooperative  Mood Depressed;Pleasant  Thought Process  Coherency WDL  Content WDL  Delusions None reported or observed  Perception WDL  Hallucination None reported or observed  Judgment Impaired  Confusion None  Danger to Self  Current suicidal ideation? Denies  Self-Injurious Behavior No self-injurious ideation or behavior indicators observed or expressed   Agreement Not to Harm Self Yes  Danger to Others  Danger to Others None reported or observed

## 2023-07-22 NOTE — BH IP Treatment Plan (Signed)
Interdisciplinary Treatment and Diagnostic Plan Update  07/22/2023 Time of Session: 9:30 AM  Randall Harvey. MRN: [id number]  Principal Diagnosis: MDD (major depressive disorder)  Secondary Diagnoses: Principal Problem:   MDD (major depressive disorder) Active Problems:   MDD (major depressive disorder), recurrent severe, without psychosis (HCC)   Cocaine abuse (HCC)   Headache   Current Medications:  Current Facility-Administered Medications  Medication Dose Route Frequency Provider Last Rate Last Admin   alum & mag hydroxide-simeth (MAALOX/MYLANTA) 200-200-20 MG/5ML suspension 30 mL  30 mL Oral Q4H PRN Lauree Chandler, NP       busPIRone (BUSPAR) tablet 5 mg  5 mg Oral BID Lewanda Rife, MD   5 mg at 07/22/23 0857   butalbital-acetaminophen-caffeine (FIORICET) 50-325-40 MG per tablet 1 tablet  1 tablet Oral Q6H PRN Emeline General, MD   1 tablet at 07/21/23 1433   diphenhydrAMINE (BENADRYL) capsule 50 mg  50 mg Oral TID PRN Lauree Chandler, NP       Or   diphenhydrAMINE (BENADRYL) injection 50 mg  50 mg Intramuscular TID PRN Lauree Chandler, NP       docusate sodium (COLACE) capsule 100 mg  100 mg Oral BID Lewanda Rife, MD   100 mg at 07/22/23 0900   feeding supplement (ENSURE ENLIVE / ENSURE PLUS) liquid 237 mL  237 mL Oral BID BM Sarina Ill, DO   237 mL at 07/21/23 1023   haloperidol (HALDOL) tablet 5 mg  5 mg Oral TID PRN Lauree Chandler, NP       Or   haloperidol lactate (HALDOL) injection 5 mg  5 mg Intramuscular TID PRN Lauree Chandler, NP       hydrOXYzine (ATARAX) tablet 25 mg  25 mg Oral TID PRN Lauree Chandler, NP   25 mg at 07/21/23 2109   LORazepam (ATIVAN) tablet 2 mg  2 mg Oral TID PRN Lauree Chandler, NP       Or   LORazepam (ATIVAN) injection 2 mg  2 mg Intramuscular TID PRN Lauree Chandler, NP       magnesium hydroxide (MILK OF MAGNESIA) suspension 30 mL  30 mL Oral Daily PRN Lauree Chandler, NP        meclizine (ANTIVERT) tablet 12.5 mg  12.5 mg Oral TID PRN Mikey College T, MD   12.5 mg at 07/21/23 1608   melatonin tablet 5 mg  5 mg Oral QHS PRN Lewanda Rife, MD   5 mg at 07/21/23 2110   mirtazapine (REMERON) tablet 15 mg  15 mg Oral QHS Sarina Ill, DO   15 mg at 07/21/23 2109   multivitamin with minerals tablet 1 tablet  1 tablet Oral Daily Sarina Ill, DO   1 tablet at 07/22/23 0900   nicotine (NICODERM CQ - dosed in mg/24 hours) patch 14 mg  14 mg Transdermal Daily Lewanda Rife, MD   14 mg at 07/22/23 0901   niMODipine (NIMOTOP) capsule 60 mg  60 mg Oral Q4H Mikey College T, MD   60 mg at 07/22/23 0857   sertraline (ZOLOFT) tablet 50 mg  50 mg Oral Daily Sarina Ill, DO   50 mg at 07/22/23 6433   PTA Medications: Medications Prior to Admission  Medication Sig Dispense Refill Last Dose   acetaminophen (TYLENOL) 500 MG tablet Take 2 tablets (1,000 mg total) by mouth every 8 (eight) hours as needed. (Patient not taking: Reported on 02/21/2022) 30 tablet  0    albuterol (VENTOLIN HFA) 108 (90 Base) MCG/ACT inhaler Inhale 2 puffs into the lungs every 6 (six) hours as needed for wheezing or shortness of breath. (Patient not taking: Reported on 02/21/2022) 8 g 0    docusate sodium (COLACE) 100 MG capsule Take 1 tablet once or twice daily as needed for constipation while taking narcotic pain medicine (Patient not taking: Reported on 02/21/2022) 30 capsule 0    fluticasone (FLONASE) 50 MCG/ACT nasal spray Place 2 sprays into both nostrils daily. (Patient not taking: Reported on 02/21/2022) 16 g 6    folic acid (FOLVITE) 1 MG tablet Take 1 tablet (1 mg total) by mouth daily. (Patient not taking: Reported on 02/21/2022) 30 tablet 0    Multiple Vitamin (MULTIVITAMIN WITH MINERALS) TABS tablet Take 1 tablet by mouth daily. (Patient not taking: Reported on 02/21/2022) 30 tablet 0    polyethylene glycol (MIRALAX / GLYCOLAX) 17 g packet Take 17 g by mouth daily. (Patient  not taking: Reported on 02/21/2022) 14 each 0    sertraline (ZOLOFT) 25 MG tablet Take 1 tablet (25 mg total) by mouth daily. (Patient not taking: Reported on 02/04/2022) 30 tablet 3    thiamine 100 MG tablet Take 1 tablet (100 mg total) by mouth daily. (Patient not taking: Reported on 02/21/2022) 30 tablet 0     Patient Stressors:    Patient Strengths:    Treatment Modalities: Medication Management, Group therapy, Case management,  1 to 1 session with clinician, Psychoeducation, Recreational therapy.   Physician Treatment Plan for Primary Diagnosis: MDD (major depressive disorder) Long Term Goal(s): Improvement in symptoms so as ready for discharge   Short Term Goals: Ability to identify changes in lifestyle to reduce recurrence of condition will improve Ability to verbalize feelings will improve Ability to disclose and discuss suicidal ideas Ability to demonstrate self-control will improve Ability to identify and develop effective coping behaviors will improve Ability to maintain clinical measurements within normal limits will improve Compliance with prescribed medications will improve Ability to identify triggers associated with substance abuse/mental health issues will improve  Medication Management: Evaluate patient's response, side effects, and tolerance of medication regimen.  Therapeutic Interventions: 1 to 1 sessions, Unit Group sessions and Medication administration.  Evaluation of Outcomes: Progressing  Physician Treatment Plan for Secondary Diagnosis: Principal Problem:   MDD (major depressive disorder) Active Problems:   MDD (major depressive disorder), recurrent severe, without psychosis (HCC)   Cocaine abuse (HCC)   Headache  Long Term Goal(s): Improvement in symptoms so as ready for discharge   Short Term Goals: Ability to identify changes in lifestyle to reduce recurrence of condition will improve Ability to verbalize feelings will improve Ability to disclose  and discuss suicidal ideas Ability to demonstrate self-control will improve Ability to identify and develop effective coping behaviors will improve Ability to maintain clinical measurements within normal limits will improve Compliance with prescribed medications will improve Ability to identify triggers associated with substance abuse/mental health issues will improve     Medication Management: Evaluate patient's response, side effects, and tolerance of medication regimen.  Therapeutic Interventions: 1 to 1 sessions, Unit Group sessions and Medication administration.  Evaluation of Outcomes: Progressing   RN Treatment Plan for Primary Diagnosis: MDD (major depressive disorder) Long Term Goal(s): Knowledge of disease and therapeutic regimen to maintain health will improve  Short Term Goals: Ability to remain free from injury will improve, Ability to verbalize frustration and anger appropriately will improve, Ability to demonstrate self-control, Ability to  participate in decision making will improve, Ability to verbalize feelings will improve, Ability to disclose and discuss suicidal ideas, Ability to identify and develop effective coping behaviors will improve, and Compliance with prescribed medications will improve  Medication Management: RN will administer medications as ordered by provider, will assess and evaluate patient's response and provide education to patient for prescribed medication. RN will report any adverse and/or side effects to prescribing provider.  Therapeutic Interventions: 1 on 1 counseling sessions, Psychoeducation, Medication administration, Evaluate responses to treatment, Monitor vital signs and CBGs as ordered, Perform/monitor CIWA, COWS, AIMS and Fall Risk screenings as ordered, Perform wound care treatments as ordered.  Evaluation of Outcomes: Progressing   LCSW Treatment Plan for Primary Diagnosis: MDD (major depressive disorder) Long Term Goal(s): Safe  transition to appropriate next level of care at discharge, Engage patient in therapeutic group addressing interpersonal concerns.  Short Term Goals: Engage patient in aftercare planning with referrals and resources, Increase social support, Increase ability to appropriately verbalize feelings, Increase emotional regulation, Facilitate acceptance of mental health diagnosis and concerns, Facilitate patient progression through stages of change regarding substance use diagnoses and concerns, Identify triggers associated with mental health/substance abuse issues, and Increase skills for wellness and recovery  Therapeutic Interventions: Assess for all discharge needs, 1 to 1 time with Social worker, Explore available resources and support systems, Assess for adequacy in community support network, Educate family and significant other(s) on suicide prevention, Complete Psychosocial Assessment, Interpersonal group therapy.  Evaluation of Outcomes: Progressing   Progress in Treatment: Attending groups: Yes. Participating in groups: Yes. Taking medication as prescribed: Yes. Toleration medication: Yes. Family/Significant other contact made: Yes, individual(s) contacted:  Jola Schmidt, sister  Patient understands diagnosis: Yes. Discussing patient identified problems/goals with staff: Yes. Medical problems stabilized or resolved: Yes. Denies suicidal/homicidal ideation: Yes. Issues/concerns per patient self-inventory: No. Other: None   New problem(s) identified: No, Describe:  None identified Update 07/22/23: No changes at this time    New Short Term/Long Term Goal(s):  elimination of symptoms of psychosis, medication management for mood stabilization; elimination of SI thoughts; development of comprehensive mental wellness plan. Update 07/22/23: No changes at this time    Patient Goals:  " Get better, stop this pain and dizziness and stuff" Update 07/22/23: No changes at this time    Discharge Plan  or Barriers: CSW will assist with appropriate discharge planning Update 07/22/23: Pt continues to endorse depression. Pt reports he has a boarding home that he can return to.    Reason for Continuation of Hospitalization: Depression Medical Issues Medication stabilization   Estimated Length of Stay: 1 to 7 days Update 07/22/23: TBD  Last 3 [place] Suicide Severity Risk Score: Flowsheet Row ED from 07/15/2023 in [facility] Department at [facility] ED from 06/29/2023 in [facility] Emergency Department at [facility] ED from 02/19/2023 in [facility] Emergency Department at [facility]  C-SSRS RISK CATEGORY No Risk No Risk No Risk       Last PHQ 2/9 Scores:    09/24/2021   11:36 AM 09/19/2021    1:07 PM 08/20/2021   11:35 AM  Depression screen PHQ 2/9  Decreased Interest 3 0 0  Down, Depressed, Hopeless 3 0 0  PHQ - 2 Score 6 0 0  Altered sleeping 1 0 0  Tired, decreased energy 1 0 0  Change in appetite 0 0 0  Feeling bad or failure about yourself  3 0 0  Trouble concentrating 0 0 0  Moving  slowly or fidgety/restless 0 0 0  Suicidal thoughts 1 0 0  PHQ-9 Score 12 0 0  Difficult doing work/chores Extremely dIfficult Not difficult at all Not difficult at all    Scribe for Treatment Team: Elza Rafter, LCSWA 07/22/2023 11:12 AM

## 2023-07-22 NOTE — Plan of Care (Signed)
  Problem: Nutrition: Goal: Adequate nutrition will be maintained Outcome: Progressing   Problem: Coping: Goal: Level of anxiety will decrease Outcome: Progressing  Patient interacting well with Peers and Staff visible in Milieu this shift Denies SI/HI/A/VH and verbally contracts for safety. No adverse drug effects noted. Support and encouragement provided.

## 2023-07-22 NOTE — Progress Notes (Signed)
[facility] MD Progress Note  07/22/2023 12:08 PM Randall Harvey.  MRN:  [id number] Subjective: Randall Harvey is seen on rounds.  I increased his Zoloft yesterday and started him on Remeron at nighttime.  He states that he slept much better.  His affect is greatly improved.  He seems like he is feeling better.  He has no complaints including no complaints of pain.  He denies any side effects from his medications.  Nurses report no issues. Principal Problem: MDD (major depressive disorder) Diagnosis: Principal Problem:   MDD (major depressive disorder) Active Problems:   MDD (major depressive disorder), recurrent severe, without psychosis (HCC)   Cocaine abuse (HCC)   Headache  Total Time spent with patient: 15 minutes  Past Psychiatric History: Unremarkable  Past Medical History:  Past Medical History:  Diagnosis Date   Alcohol intoxication (HCC)    Anticoagulant long-term use    lifetime use, coumadin therapy, then Xarelto   Anxiety    Arthritis    Cancer (HCC)    renal   Carotid atherosclerosis    <50% left and right   Clotting disorder (HCC)    COPD (chronic obstructive pulmonary disease) (HCC)    Depression    Elevated liver enzymes 11/2013   Family history of cancer    Fatty liver    H/O drug abuse (HCC)    H/O ETOH abuse    Hepatitis    History of traumatic brain injury    Hypertension    Lymphadenopathy    Obesity    Personal history of DVT (deep vein thrombosis) 06/2013   Pre-diabetes    Primary osteoarthritis of left knee    Pulmonary embolism and infarction (HCC) 06/2013   Pulmonary embolism and infarction (HCC) 11/2013   Pulmonary embolus with infarction [facility])    Renal cell carcinoma (HCC)    Renal mass, left 06/2013   with ureteral obstruction   Stroke (HCC)    Subarachnoid hemorrhage following injury (HCC)    Thrombocythemia    Thrombocytopenia (HCC)    Tobacco abuse     Past Surgical History:  Procedure Laterality Date   ANKLE FRACTURE SURGERY Right 12/2012    ANKLE SURGERY     EXCISION METACARPAL MASS Right 09/18/2016   Procedure: EXCISION METACARPAL MASS;  Surgeon: Kennedy Bucker, MD;  Location: [facility] ORS;  Service: Orthopedics;  Laterality: Right;  5th digit    HERNIA REPAIR     Umbilical Hernia   ORBITAL FRACTURE SURGERY Left    ROBOTIC ASSITED PARTIAL NEPHRECTOMY Left Oct 2014   Family History:  Family History  Problem Relation Age of Onset   Pancreatic cancer Mother    Colon cancer Father    Diabetes Father    Colon cancer Sister    COPD Sister    Seizures Sister    Ovarian cancer Sister    Cancer Paternal Grandmother        bone   Stroke Paternal Grandfather    Heart disease Paternal Grandfather    Hypertension Neg Hx    Family Psychiatric  History: Unremarkable Social History:  Social History   Substance and Sexual Activity  Alcohol Use Yes   Comment: occasionally     Social History   Substance and Sexual Activity  Drug Use Yes   Types: Marijuana   Comment: occ    Social History   Socioeconomic History   Marital status: Divorced    Spouse name: Not on file   Number of children: Not on file  Years of education: Not on file   Highest education level: Not on file  Occupational History   Not on file  Tobacco Use   Smoking status: Every Day    Current packs/day: 0.50    Types: Cigarettes   Smokeless tobacco: Never  Vaping Use   Vaping status: Never Used  Substance and Sexual Activity   Alcohol use: Yes    Comment: occasionally   Drug use: Yes    Types: Marijuana    Comment: occ   Sexual activity: Yes  Other Topics Concern   Not on file  Social History Narrative   ** Merged History Encounter **       Social Determinants of Health   Financial Resource Strain: Not on file  Food Insecurity: Food Insecurity Present (07/15/2023)   Hunger Vital Sign    Worried About Running Out of Food in the Last Year: Sometimes true    Ran Out of Food in the Last Year: Sometimes true  Transportation Needs: Unmet  Transportation Needs (07/15/2023)   PRAPARE - Administrator, Civil Service (Medical): Yes    Lack of Transportation (Non-Medical): Yes  Physical Activity: Not on file  Stress: Not on file  Social Connections: Not on file   Additional Social History:                         Sleep: Good  Appetite:  Good  Current Medications: Current Facility-Administered Medications  Medication Dose Route Frequency Provider Last Rate Last Admin   alum & mag hydroxide-simeth (MAALOX/MYLANTA) 200-200-20 MG/5ML suspension 30 mL  30 mL Oral Q4H PRN Lauree Chandler, NP       busPIRone (BUSPAR) tablet 5 mg  5 mg Oral BID Lewanda Rife, MD   5 mg at 07/22/23 0857   butalbital-acetaminophen-caffeine (FIORICET) 50-325-40 MG per tablet 1 tablet  1 tablet Oral Q6H PRN Emeline General, MD   1 tablet at 07/21/23 1433   diphenhydrAMINE (BENADRYL) capsule 50 mg  50 mg Oral TID PRN Lauree Chandler, NP       Or   diphenhydrAMINE (BENADRYL) injection 50 mg  50 mg Intramuscular TID PRN Lauree Chandler, NP       docusate sodium (COLACE) capsule 100 mg  100 mg Oral BID Lewanda Rife, MD   100 mg at 07/22/23 0900   feeding supplement (ENSURE ENLIVE / ENSURE PLUS) liquid 237 mL  237 mL Oral BID BM Sarina Ill, DO   237 mL at 07/22/23 1000   haloperidol (HALDOL) tablet 5 mg  5 mg Oral TID PRN Lauree Chandler, NP       Or   haloperidol lactate (HALDOL) injection 5 mg  5 mg Intramuscular TID PRN Lauree Chandler, NP       hydrOXYzine (ATARAX) tablet 25 mg  25 mg Oral TID PRN Lauree Chandler, NP   25 mg at 07/21/23 2109   LORazepam (ATIVAN) tablet 2 mg  2 mg Oral TID PRN Lauree Chandler, NP       Or   LORazepam (ATIVAN) injection 2 mg  2 mg Intramuscular TID PRN Lauree Chandler, NP       magnesium hydroxide (MILK OF MAGNESIA) suspension 30 mL  30 mL Oral Daily PRN Lauree Chandler, NP       meclizine (ANTIVERT) tablet 12.5 mg  12.5 mg Oral TID PRN Emeline General, MD   12.5 mg at  07/21/23 1608   melatonin tablet 5 mg  5 mg Oral QHS PRN Lewanda Rife, MD   5 mg at 07/21/23 2110   mirtazapine (REMERON) tablet 15 mg  15 mg Oral QHS Sarina Ill, DO   15 mg at 07/21/23 2109   multivitamin with minerals tablet 1 tablet  1 tablet Oral Daily Sarina Ill, DO   1 tablet at 07/22/23 0900   nicotine (NICODERM CQ - dosed in mg/24 hours) patch 14 mg  14 mg Transdermal Daily Lewanda Rife, MD   14 mg at 07/22/23 0901   niMODipine (NIMOTOP) capsule 60 mg  60 mg Oral Q4H Mikey College T, MD   60 mg at 07/22/23 1154   sertraline (ZOLOFT) tablet 50 mg  50 mg Oral Daily Sarina Ill, DO   50 mg at 07/22/23 4098    Lab Results: No results found for this or any previous visit (from the past 48 hour(s)).  Blood Alcohol level:  Lab Results  Component Value Date   ETH <10 07/15/2023   ETH 250 (H) 01/13/2023    Metabolic Disorder Labs: Lab Results  Component Value Date   HGBA1C 5.6 08/20/2021   MPG 114 08/20/2021   MPG 146 09/27/2018   No results found for: "PROLACTIN" Lab Results  Component Value Date   CHOL 169 09/19/2021   TRIG 307 (H) 09/19/2021   HDL 49 09/19/2021   CHOLHDL 3.4 09/19/2021   VLDL NOT CALC 08/07/2016   LDLCALC 83 09/19/2021   LDLCALC 98 09/27/2018    Physical Findings: AIMS:  , ,  ,  ,    CIWA:    COWS:     Musculoskeletal: Strength & Muscle Tone: within normal limits Gait & Station: normal Patient leans: N/A  Psychiatric Specialty Exam:  Presentation  General Appearance:  Disheveled  Eye Contact: Fair  Speech: Normal Rate  Speech Volume: Increased  Handedness: Right   Mood and Affect  Mood: Depressed  Affect: Blunt; Tearful   Thought Process  Thought Processes: Coherent  Descriptions of Associations:Intact  Orientation:Full (Time, Place and Person)  Thought Content:Paranoid Ideation (Patient states since attack causing TBI he is paranoid that someone  will try to hurt him again.)  History of Schizophrenia/Schizoaffective disorder:No  Duration of Psychotic Symptoms:N/A  Hallucinations:No data recorded Ideas of Reference:None  Suicidal Thoughts:No data recorded Homicidal Thoughts:No data recorded  Sensorium  Memory: Immediate Fair; Recent Poor; Remote Poor  Judgment: Fair  Insight: Fair   Art therapist  Concentration: Fair  Attention Span: Good  Recall: Poor  Fund of Knowledge: Fair  Language: Good   Psychomotor Activity  Psychomotor Activity:No data recorded  Assets  Assets: Communication Skills; Financial Resources/Insurance; Housing   Sleep  Sleep:No data recorded   Blood pressure 126/82, pulse 66, temperature 97.9 F (36.6 C), resp. rate 18, height 5\' 7"  (1.702 m), weight 73.7 kg, SpO2 98%. Body mass index is 25.45 kg/m.   Treatment Plan Summary: Daily contact with patient to assess and evaluate symptoms and progress in treatment, Medication management, and Plan continue current medications.  Sarina Ill, DO 07/22/2023, 12:08 PM

## 2023-07-22 NOTE — Group Note (Signed)
Recreation Therapy Group Note   Group Topic:General Recreation  Group Date: 07/22/2023 Start Time: 1400 End Time: 1455 Facilitators: Rosina Lowenstein, LRT, CTRS Location: Courtyard  Group Description: Outdoor Recreation. Patients had the option to play corn hole, ring toss, playing cards or listening to music while outside in the courtyard getting fresh air and sunlight. Marland Kitchen LRT and patients discussed things that they enjoy doing in their free time outside of the hospital. LRT encouraged patients to drink water after being active and getting their heart rate up.   Goal Area(s) Addressed: Patient will identify leisure interests.  Patient will practice healthy decision making. Patient will engage in recreation activity.   Affect/Mood: Appropriate   Participation Level: Active and Engaged   Participation Quality: Independent   Behavior: Calm and Cooperative   Speech/Thought Process: Coherent   Insight: Good   Judgement: Good   Modes of Intervention: Activity   Patient Response to Interventions:  Attentive, Engaged, Interested , and Receptive   Education Outcome:  Acknowledges education   Clinical Observations/Individualized Feedback: Randall Harvey was active in their participation of session activities and group discussion. Pt chose to listen to music while outside in group. Pt requested songs by [name] and shared about past concerts he had been to. Pt interacted well with LRT and peers duration of session.   Plan: Continue to engage patient in RT group sessions 2-3x/week.   Rosina Lowenstein, LRT, CTRS 07/22/2023 3:39 PM

## 2023-07-22 NOTE — [facility] Counselor (Signed)
CSW contacted [employer], [place] per pt's request regarding housing and renting.   CSW left HIPAA compliant VM requesting return call.    Reynaldo Minium, MSW, [place] 07/22/2023 4:12 PM

## 2023-07-22 NOTE — Group Note (Signed)
Date:  07/22/2023 Time:  10:06 PM  Group Topic/Focus:  Identifying Needs:   The focus of this group is to help patients identify their personal needs that have been historically problematic and identify healthy behaviors to address their needs.    Participation Level:  Active  Participation Quality:  Appropriate  Affect:  Appropriate  Cognitive:  Appropriate  Insight: Appropriate  Engagement in Group:  Engaged  Modes of Intervention:  Education  Additional Comments:    Garry Heater 07/22/2023, 10:06 PM

## 2023-07-22 NOTE — Progress Notes (Signed)
D: Pt alert and oriented. Pt denies experiencing any anxiety/depression at time of assessment. Pt rates his pain 6/10. Pt denies experiencing any SI/HI, or AVH at this time.    A: Scheduled medications administered to pt, per MD orders. Support and encouragement provided. Frequent verbal contact made. Routine safety checks conducted q15 minutes.   R: No adverse drug reactions noted. Pt verbally contracts for safety at this time. Pt complaint with medications. Pt interacts minimally with others on the unit. Pt remains safe at this time. Will continue to monitor.

## 2023-07-23 DIAGNOSIS — F332 Major depressive disorder, recurrent severe without psychotic features: Secondary | ICD-10-CM | POA: Diagnosis not present

## 2023-07-23 NOTE — Progress Notes (Signed)
[facility] MD Progress Note  07/23/2023 1:17 PM Randall Harvey.  MRN:  [id number] Subjective: Randall Harvey is seen on rounds.  He is sleeping better since starting Remeron.  His mood is improving.  He has a court date on Monday and I told him he needs to call Court house himself.  Told him we will give him the phone number.  He is tolerating the medications without any problems.  I told him that he will probably have to make his court date on Monday which with me discharged this weekend. Principal Problem: MDD (major depressive disorder) Diagnosis: Principal Problem:   MDD (major depressive disorder) Active Problems:   MDD (major depressive disorder), recurrent severe, without psychosis (HCC)   Cocaine abuse (HCC)   Headache  Total Time spent with patient: 15 minutes  Past Psychiatric History: Polysubstance abuse and depression  Past Medical History:  Past Medical History:  Diagnosis Date   Alcohol intoxication (HCC)    Anticoagulant long-term use    lifetime use, coumadin therapy, then Xarelto   Anxiety    Arthritis    Cancer (HCC)    renal   Carotid atherosclerosis    <50% left and right   Clotting disorder (HCC)    COPD (chronic obstructive pulmonary disease) (HCC)    Depression    Elevated liver enzymes 11/2013   Family history of cancer    Fatty liver    H/O drug abuse (HCC)    H/O ETOH abuse    Hepatitis    History of traumatic brain injury    Hypertension    Lymphadenopathy    Obesity    Personal history of DVT (deep vein thrombosis) 06/2013   Pre-diabetes    Primary osteoarthritis of left knee    Pulmonary embolism and infarction (HCC) 06/2013   Pulmonary embolism and infarction (HCC) 11/2013   Pulmonary embolus with infarction [facility])    Renal cell carcinoma (HCC)    Renal mass, left 06/2013   with ureteral obstruction   Stroke (HCC)    Subarachnoid hemorrhage following injury (HCC)    Thrombocythemia    Thrombocytopenia (HCC)    Tobacco abuse     Past Surgical  History:  Procedure Laterality Date   ANKLE FRACTURE SURGERY Right 12/2012   ANKLE SURGERY     EXCISION METACARPAL MASS Right 09/18/2016   Procedure: EXCISION METACARPAL MASS;  Surgeon: Kennedy Bucker, MD;  Location: [facility] ORS;  Service: Orthopedics;  Laterality: Right;  5th digit    HERNIA REPAIR     Umbilical Hernia   ORBITAL FRACTURE SURGERY Left    ROBOTIC ASSITED PARTIAL NEPHRECTOMY Left Oct 2014   Family History:  Family History  Problem Relation Age of Onset   Pancreatic cancer Mother    Colon cancer Father    Diabetes Father    Colon cancer Sister    COPD Sister    Seizures Sister    Ovarian cancer Sister    Cancer Paternal Grandmother        bone   Stroke Paternal Grandfather    Heart disease Paternal Grandfather    Hypertension Neg Hx    Family Psychiatric  History: Unremarkable Social History:  Social History   Substance and Sexual Activity  Alcohol Use Yes   Comment: occasionally     Social History   Substance and Sexual Activity  Drug Use Yes   Types: Marijuana   Comment: occ    Social History   Socioeconomic History   Marital status: Divorced  Spouse name: Not on file   Number of children: Not on file   Years of education: Not on file   Highest education level: Not on file  Occupational History   Not on file  Tobacco Use   Smoking status: Every Day    Current packs/day: 0.50    Types: Cigarettes   Smokeless tobacco: Never  Vaping Use   Vaping status: Never Used  Substance and Sexual Activity   Alcohol use: Yes    Comment: occasionally   Drug use: Yes    Types: Marijuana    Comment: occ   Sexual activity: Yes  Other Topics Concern   Not on file  Social History Narrative   ** Merged History Encounter **       Social Determinants of Health   Financial Resource Strain: Not on file  Food Insecurity: Food Insecurity Present (07/15/2023)   Hunger Vital Sign    Worried About Running Out of Food in the Last Year: Sometimes true    Ran  Out of Food in the Last Year: Sometimes true  Transportation Needs: Unmet Transportation Needs (07/15/2023)   PRAPARE - Administrator, Civil Service (Medical): Yes    Lack of Transportation (Non-Medical): Yes  Physical Activity: Not on file  Stress: Not on file  Social Connections: Not on file   Additional Social History:                         Sleep: Good  Appetite:  Good  Current Medications: Current Facility-Administered Medications  Medication Dose Route Frequency Provider Last Rate Last Admin   alum & mag hydroxide-simeth (MAALOX/MYLANTA) 200-200-20 MG/5ML suspension 30 mL  30 mL Oral Q4H PRN Lauree Chandler, NP       busPIRone (BUSPAR) tablet 5 mg  5 mg Oral BID Lewanda Rife, MD   5 mg at 07/23/23 0905   butalbital-acetaminophen-caffeine (FIORICET) 50-325-40 MG per tablet 1 tablet  1 tablet Oral Q6H PRN Emeline General, MD   1 tablet at 07/22/23 1313   diphenhydrAMINE (BENADRYL) capsule 50 mg  50 mg Oral TID PRN Lauree Chandler, NP       Or   diphenhydrAMINE (BENADRYL) injection 50 mg  50 mg Intramuscular TID PRN Lauree Chandler, NP       docusate sodium (COLACE) capsule 100 mg  100 mg Oral BID Lewanda Rife, MD   100 mg at 07/22/23 2114   feeding supplement (ENSURE ENLIVE / ENSURE PLUS) liquid 237 mL  237 mL Oral BID BM Sarina Ill, DO   237 mL at 07/23/23 1229   haloperidol (HALDOL) tablet 5 mg  5 mg Oral TID PRN Lauree Chandler, NP       Or   haloperidol lactate (HALDOL) injection 5 mg  5 mg Intramuscular TID PRN Lauree Chandler, NP       hydrOXYzine (ATARAX) tablet 25 mg  25 mg Oral TID PRN Lauree Chandler, NP   25 mg at 07/22/23 2114   LORazepam (ATIVAN) tablet 2 mg  2 mg Oral TID PRN Lauree Chandler, NP       Or   LORazepam (ATIVAN) injection 2 mg  2 mg Intramuscular TID PRN Lauree Chandler, NP       magnesium hydroxide (MILK OF MAGNESIA) suspension 30 mL  30 mL Oral Daily PRN Lauree Chandler,  NP       meclizine (ANTIVERT) tablet 12.5 mg  12.5 mg Oral TID PRN Mikey College T, MD   12.5 mg at 07/23/23 1213   melatonin tablet 5 mg  5 mg Oral QHS PRN Lewanda Rife, MD   5 mg at 07/22/23 2114   mirtazapine (REMERON) tablet 15 mg  15 mg Oral QHS Sarina Ill, DO   15 mg at 07/22/23 2115   multivitamin with minerals tablet 1 tablet  1 tablet Oral Daily Sarina Ill, DO   1 tablet at 07/23/23 7846   nicotine (NICODERM CQ - dosed in mg/24 hours) patch 14 mg  14 mg Transdermal Daily Lewanda Rife, MD   14 mg at 07/23/23 0905   niMODipine (NIMOTOP) capsule 60 mg  60 mg Oral Q4H Mikey College T, MD   60 mg at 07/23/23 1213   sertraline (ZOLOFT) tablet 50 mg  50 mg Oral Daily Sarina Ill, DO   50 mg at 07/23/23 9629    Lab Results: No results found for this or any previous visit (from the past 48 hour(s)).  Blood Alcohol level:  Lab Results  Component Value Date   ETH <10 07/15/2023   ETH 250 (H) 01/13/2023    Metabolic Disorder Labs: Lab Results  Component Value Date   HGBA1C 5.6 08/20/2021   MPG 114 08/20/2021   MPG 146 09/27/2018   No results found for: "PROLACTIN" Lab Results  Component Value Date   CHOL 169 09/19/2021   TRIG 307 (H) 09/19/2021   HDL 49 09/19/2021   CHOLHDL 3.4 09/19/2021   VLDL NOT CALC 08/07/2016   LDLCALC 83 09/19/2021   LDLCALC 98 09/27/2018    Physical Findings: AIMS:  , ,  ,  ,    CIWA:    COWS:     Musculoskeletal: Strength & Muscle Tone: within normal limits Gait & Station: normal Patient leans: N/A  Psychiatric Specialty Exam:  Presentation  General Appearance:  Disheveled  Eye Contact: Fair  Speech: Normal Rate  Speech Volume: Increased  Handedness: Right   Mood and Affect  Mood: Depressed  Affect: Blunt; Tearful   Thought Process  Thought Processes: Coherent  Descriptions of Associations:Intact  Orientation:Full (Time, Place and Person)  Thought Content:Paranoid  Ideation (Patient states since attack causing TBI he is paranoid that someone will try to hurt him again.)  History of Schizophrenia/Schizoaffective disorder:No  Duration of Psychotic Symptoms:N/A  Hallucinations:No data recorded Ideas of Reference:None  Suicidal Thoughts:No data recorded Homicidal Thoughts:No data recorded  Sensorium  Memory: Immediate Fair; Recent Poor; Remote Poor  Judgment: Fair  Insight: Fair   Art therapist  Concentration: Fair  Attention Span: Good  Recall: Poor  Fund of Knowledge: Fair  Language: Good   Psychomotor Activity  Psychomotor Activity:No data recorded  Assets  Assets: Communication Skills; Financial Resources/Insurance; Housing   Sleep  Sleep:No data recorded    Blood pressure 135/76, pulse 66, temperature (!) 97.5 F (36.4 C), resp. rate 16, height 5\' 7"  (1.702 m), weight 73.7 kg, SpO2 99%. Body mass index is 25.45 kg/m.   Treatment Plan Summary: Daily contact with patient to assess and evaluate symptoms and progress in treatment, Medication management, and Plan continue current medications.  Sarina Ill, DO 07/23/2023, 1:17 PM

## 2023-07-23 NOTE — BHH Counselor (Signed)
CSW contacted [employer] per his request.   Venita Lick reports they don't have anything for immediate occupancy.   They report pt needs to come, get an application, applications outside in a red box. They report he can turn complete applications into the box beside it.    [employer] CSW application.   Reynaldo Minium, MSW, [place] 07/23/2023 3:29 PM

## 2023-07-23 NOTE — BHH Counselor (Signed)
CSW contacted [employer], [place] per pt's request regarding housing and renting.    CSW left HIPAA compliant VM requesting return call.   Reynaldo Minium, MSW, [place] 07/23/2023 11:40 AM

## 2023-07-23 NOTE — Progress Notes (Signed)
Patient admitted to [facility] for making SI statements and complaints of worsening depression. Patient has a hx of anxiety and polysubstance abuse. UDS + barb and coc.  He denies SI/HI/AVH. He denies anxiety and depression. Patient complains of dizziness. Meclizine adm at 1213. Upon follow up, med was effective.  Potential discharge end of this week.  Q15 minute unit checks in place.

## 2023-07-23 NOTE — Group Note (Signed)
Date:  07/23/2023 Time:  8:42 PM  Group Topic/Focus:  Managing Feelings:   The focus of this group is to identify what feelings patients have difficulty handling and develop a plan to handle them in a healthier way upon discharge.    Participation Level:  Active  Participation Quality:  Appropriate  Affect:  Appropriate  Cognitive:  Appropriate  Insight: Appropriate and Good  Engagement in Group:  Engaged  Modes of Intervention:  Education  Additional Comments:    Burt Ek 07/23/2023, 8:42 PM

## 2023-07-23 NOTE — Group Note (Signed)
Recreation Therapy Group Note   Group Topic:Communication  Group Date: 07/23/2023 Start Time: 1400 End Time: 1450 Facilitators: Rosina Lowenstein, LRT, CTRS Location: Courtyard  Group Description: Emotional Check in. Patient sat and talked with LRT about how they are doing and whatever else is on their mind. LRT provided active listening, reassurance and encouragement. Pts were given the opportunity to listen to music or play cornhole while getting fresh air and sunlight in the courtyard.    Goal Area(s) Addressed: Patient will engage in conversation with LRT. Patient will communicate their wants, needs, or questions.  Patient will practice a new coping skill of "talking to someone".   Affect/Mood: Appropriate   Participation Level: Active and Engaged   Participation Quality: Independent   Behavior: Calm and Cooperative   Speech/Thought Process: Coherent   Insight: Good   Judgement: Good   Modes of Intervention: Open Conversation   Patient Response to Interventions:  Attentive, Engaged, Interested , and Receptive   Education Outcome:  Acknowledges education   Clinical Observations/Individualized Feedback: Randall Harvey was active in their participation of session activities and group discussion. Pt shared that he was overwhelmed with all that the apartment complex required of him and that he doesn't have some of the materials needed. Pt excused himself early to go speak with social work.    Plan: Continue to engage patient in RT group sessions 2-3x/week.   Rosina Lowenstein, LRT, CTRS 07/23/2023 3:16 PM

## 2023-07-23 NOTE — Progress Notes (Signed)
   07/23/23 2202  Psych Admission Type (Psych Patients Only)  Admission Status Voluntary  Psychosocial Assessment  Patient Complaints Anxiety;Depression;Worrying  Eye Contact Fair  Facial Expression Flat  Affect Sad  Speech Logical/coherent  Interaction Assertive  Motor Activity Slow  Appearance/Hygiene In scrubs  Behavior Characteristics Cooperative  Mood Depressed;Anxious  Thought Process  Coherency WDL  Content WDL  Delusions None reported or observed  Perception WDL  Hallucination None reported or observed  Judgment Impaired  Confusion None  Danger to Self  Current suicidal ideation? Denies  Agreement Not to Harm Self Yes  Description of Agreement verbal  Danger to Others  Danger to Others None reported or observed

## 2023-07-23 NOTE — Group Note (Signed)
LCSW Group Therapy Note   Group Date: 07/23/2023 Start Time: 1328 End Time: 1400   Type of Therapy and Topic:  Group Therapy: Boundaries  Participation Level:  Did Not Attend  Description of Group: This group will address the use of boundaries in their personal lives. Patients will explore why boundaries are important, the difference between healthy and unhealthy boundaries, and negative and postive outcomes of different boundaries and will look at how boundaries can be crossed.  Patients will be encouraged to identify current boundaries in their own lives and identify what kind of boundary is being set. Facilitators will guide patients in utilizing problem-solving interventions to address and correct types boundaries being used and to address when no boundary is being used. Understanding and applying boundaries will be explored and addressed for obtaining and maintaining a balanced life. Patients will be encouraged to explore ways to assertively make their boundaries and needs known to significant others in their lives, using other group members and facilitator for role play, support, and feedback.  Therapeutic Goals:  1.  Patient will identify areas in their life where setting clear boundaries could be  used to improve their life.  2.  Patient will identify signs/triggers that a boundary is not being respected. 3.  Patient will identify two ways to set boundaries in order to achieve balance in  their lives: 4.  Patient will demonstrate ability to communicate their needs and set boundaries  through discussion and/or role plays  Summary of Patient Progress:  Patient did not attend group.  Therapeutic Modalities:   Cognitive Behavioral Therapy Solution-Focused Therapy  Rhett Bannister 07/23/2023  3:27 PM

## 2023-07-23 NOTE — BHH Counselor (Addendum)
CSW sent letter to DA's office per pt's request.   Pt requested that provider write letter excusing him from court date on Monday 07/27/23.   CSW and provider informed pt that he would be discharge prior to that date so no letter of such kind could be written.   Provider gave CSW letter stating "Assael has been in the hospital since 07/16/2023. His tentative discharge date is 07/25/2023"  CSW faxed to [facility] DA at [phone].  CSW received successful fax report.    Reynaldo Minium, MSW, [place] 07/23/2023 3:32 PM

## 2023-07-23 NOTE — Plan of Care (Signed)
  Problem: Education: Goal: Knowledge of General Education information will improve Description Including pain rating scale, medication(s)/side effects and non-pharmacologic comfort measures Outcome: Progressing   Problem: Health Behavior/Discharge Planning: Goal: Ability to manage health-related needs will improve Outcome: Progressing   

## 2023-07-23 NOTE — Group Note (Signed)
Date:  07/23/2023 Time:  6:45 PM  Group Topic/Focus:  Coping With Mental Health Crisis:   The purpose of this group is to help patients identify strategies for coping with mental health crisis.  Group discusses possible causes of crisis and ways to manage them effectively. 10:00am-11:00am Early Warning Signs:   The focus of this group is to help patients identify signs or symptoms they exhibit before slipping into an unhealthy state or crisis.11:00am -12:00N   Participation Level:  Active  Participation Quality:  Appropriate, Attentive, and Supportive  Affect:  Appropriate  Cognitive:  Alert and Oriented  Insight: Appropriate and Improving  Engagement in Group:  Engaged, Improving, and Supportive  Modes of Intervention:  Activity and Clarification  Additional Comments:     Randall Harvey 07/23/2023, 6:45 PM

## 2023-07-24 DIAGNOSIS — F332 Major depressive disorder, recurrent severe without psychotic features: Secondary | ICD-10-CM | POA: Diagnosis not present

## 2023-07-24 NOTE — Plan of Care (Signed)
  Problem: Education: Goal: Knowledge of General Education information will improve Description Including pain rating scale, medication(s)/side effects and non-pharmacologic comfort measures Outcome: Progressing   Problem: Health Behavior/Discharge Planning: Goal: Ability to manage health-related needs will improve Outcome: Progressing   

## 2023-07-24 NOTE — Group Note (Signed)
Recreation Therapy Group Note   Group Topic:Leisure Education  Group Date: 07/24/2023 Start Time: 1400 End Time: 1445 Facilitators: Rosina Lowenstein, LRT, CTRS Location: Courtyard  Group Description: Leisure. Patients were given the opportunity to play ring toss, play corn hole, or listen to music while sitting in the courtyard getting fresh air and sunlight. Pt identified and conversated about things they enjoy doing in their free time and how they can continue to do that outside of the hospital.  Goal Area(s) Addressed: Patient will learn the definition of "leisure". Patient will practice making a positive decision. Patient will have the opportunity to try a new leisure activity.   Affect/Mood: N/A   Participation Level: Did not attend    Clinical Observations/Individualized Feedback: Randall Harvey did not attend group.  Plan: Continue to engage patient in RT group sessions 2-3x/week.   [id number], LRT, CTRS 07/24/2023 3:24 PM

## 2023-07-24 NOTE — Plan of Care (Signed)

## 2023-07-24 NOTE — Progress Notes (Signed)
[facility] MD Progress Note  07/24/2023 1:48 PM Randall Harvey.  MRN:  [id number] Subjective: Randall Harvey is seen on rounds.  He likes the medication changes.  His mood is improving and his headaches are not as bad.  He does not have any complaints.  No side effects from his medications.  Nurses report no issues.  He states that he still has a lot of depression and anxiety. Principal Problem: MDD (major depressive disorder) Diagnosis: Principal Problem:   MDD (major depressive disorder) Active Problems:   MDD (major depressive disorder), recurrent severe, without psychosis (HCC)   Cocaine abuse (HCC)   Headache  Total Time spent with patient: 15 minutes  Past Psychiatric History: Polysubstance abuse and depression  Past Medical History:  Past Medical History:  Diagnosis Date   Alcohol intoxication (HCC)    Anticoagulant long-term use    lifetime use, coumadin therapy, then Xarelto   Anxiety    Arthritis    Cancer (HCC)    renal   Carotid atherosclerosis    <50% left and right   Clotting disorder (HCC)    COPD (chronic obstructive pulmonary disease) (HCC)    Depression    Elevated liver enzymes 11/2013   Family history of cancer    Fatty liver    H/O drug abuse (HCC)    H/O ETOH abuse    Hepatitis    History of traumatic brain injury    Hypertension    Lymphadenopathy    Obesity    Personal history of DVT (deep vein thrombosis) 06/2013   Pre-diabetes    Primary osteoarthritis of left knee    Pulmonary embolism and infarction (HCC) 06/2013   Pulmonary embolism and infarction (HCC) 11/2013   Pulmonary embolus with infarction [facility])    Renal cell carcinoma (HCC)    Renal mass, left 06/2013   with ureteral obstruction   Stroke (HCC)    Subarachnoid hemorrhage following injury (HCC)    Thrombocythemia    Thrombocytopenia (HCC)    Tobacco abuse     Past Surgical History:  Procedure Laterality Date   ANKLE FRACTURE SURGERY Right 12/2012   ANKLE SURGERY     EXCISION METACARPAL  MASS Right 09/18/2016   Procedure: EXCISION METACARPAL MASS;  Surgeon: Kennedy Bucker, MD;  Location: [facility] ORS;  Service: Orthopedics;  Laterality: Right;  5th digit    HERNIA REPAIR     Umbilical Hernia   ORBITAL FRACTURE SURGERY Left    ROBOTIC ASSITED PARTIAL NEPHRECTOMY Left Oct 2014   Family History:  Family History  Problem Relation Age of Onset   Pancreatic cancer Mother    Colon cancer Father    Diabetes Father    Colon cancer Sister    COPD Sister    Seizures Sister    Ovarian cancer Sister    Cancer Paternal Grandmother        bone   Stroke Paternal Grandfather    Heart disease Paternal Grandfather    Hypertension Neg Hx    Family Psychiatric  History: Unremarkable Social History:  Social History   Substance and Sexual Activity  Alcohol Use Yes   Comment: occasionally     Social History   Substance and Sexual Activity  Drug Use Yes   Types: Marijuana   Comment: occ    Social History   Socioeconomic History   Marital status: Divorced    Spouse name: Not on file   Number of children: Not on file   Years of education: Not on  file   Highest education level: Not on file  Occupational History   Not on file  Tobacco Use   Smoking status: Every Day    Current packs/day: 0.50    Types: Cigarettes   Smokeless tobacco: Never  Vaping Use   Vaping status: Never Used  Substance and Sexual Activity   Alcohol use: Yes    Comment: occasionally   Drug use: Yes    Types: Marijuana    Comment: occ   Sexual activity: Yes  Other Topics Concern   Not on file  Social History Narrative   ** Merged History Encounter **       Social Determinants of Health   Financial Resource Strain: Not on file  Food Insecurity: Food Insecurity Present (07/15/2023)   Hunger Vital Sign    Worried About Running Out of Food in the Last Year: Sometimes true    Ran Out of Food in the Last Year: Sometimes true  Transportation Needs: Unmet Transportation Needs (07/15/2023)   PRAPARE  - Administrator, Civil Service (Medical): Yes    Lack of Transportation (Non-Medical): Yes  Physical Activity: Not on file  Stress: Not on file  Social Connections: Not on file   Additional Social History:                         Sleep: Good  Appetite:  Good  Current Medications: Current Facility-Administered Medications  Medication Dose Route Frequency Provider Last Rate Last Admin   alum & mag hydroxide-simeth (MAALOX/MYLANTA) 200-200-20 MG/5ML suspension 30 mL  30 mL Oral Q4H PRN Lauree Chandler, NP       busPIRone (BUSPAR) tablet 5 mg  5 mg Oral BID Lewanda Rife, MD   5 mg at 07/24/23 1025   butalbital-acetaminophen-caffeine (FIORICET) 50-325-40 MG per tablet 1 tablet  1 tablet Oral Q6H PRN Emeline General, MD   1 tablet at 07/24/23 1344   diphenhydrAMINE (BENADRYL) capsule 50 mg  50 mg Oral TID PRN Lauree Chandler, NP       Or   diphenhydrAMINE (BENADRYL) injection 50 mg  50 mg Intramuscular TID PRN Lauree Chandler, NP       docusate sodium (COLACE) capsule 100 mg  100 mg Oral BID Lewanda Rife, MD   100 mg at 07/23/23 2125   feeding supplement (ENSURE ENLIVE / ENSURE PLUS) liquid 237 mL  237 mL Oral BID BM Sarina Ill, DO   237 mL at 07/23/23 1229   haloperidol (HALDOL) tablet 5 mg  5 mg Oral TID PRN Lauree Chandler, NP       Or   haloperidol lactate (HALDOL) injection 5 mg  5 mg Intramuscular TID PRN Lauree Chandler, NP       hydrOXYzine (ATARAX) tablet 25 mg  25 mg Oral TID PRN Lauree Chandler, NP   25 mg at 07/23/23 1930   LORazepam (ATIVAN) tablet 2 mg  2 mg Oral TID PRN Lauree Chandler, NP       Or   LORazepam (ATIVAN) injection 2 mg  2 mg Intramuscular TID PRN Lauree Chandler, NP       magnesium hydroxide (MILK OF MAGNESIA) suspension 30 mL  30 mL Oral Daily PRN Lauree Chandler, NP       meclizine (ANTIVERT) tablet 12.5 mg  12.5 mg Oral TID PRN Mikey College T, MD   12.5 mg at 07/24/23 1344    melatonin  tablet 5 mg  5 mg Oral QHS PRN Lewanda Rife, MD   5 mg at 07/23/23 2124   mirtazapine (REMERON) tablet 15 mg  15 mg Oral QHS Sarina Ill, DO   15 mg at 07/23/23 2125   multivitamin with minerals tablet 1 tablet  1 tablet Oral Daily Sarina Ill, DO   1 tablet at 07/24/23 1026   nicotine (NICODERM CQ - dosed in mg/24 hours) patch 14 mg  14 mg Transdermal Daily Lewanda Rife, MD   14 mg at 07/24/23 1026   sertraline (ZOLOFT) tablet 50 mg  50 mg Oral Daily Sarina Ill, DO   50 mg at 07/24/23 1026    Lab Results: No results found for this or any previous visit (from the past 48 hour(s)).  Blood Alcohol level:  Lab Results  Component Value Date   ETH <10 07/15/2023   ETH 250 (H) 01/13/2023    Metabolic Disorder Labs: Lab Results  Component Value Date   HGBA1C 5.6 08/20/2021   MPG 114 08/20/2021   MPG 146 09/27/2018   No results found for: "PROLACTIN" Lab Results  Component Value Date   CHOL 169 09/19/2021   TRIG 307 (H) 09/19/2021   HDL 49 09/19/2021   CHOLHDL 3.4 09/19/2021   VLDL NOT CALC 08/07/2016   LDLCALC 83 09/19/2021   LDLCALC 98 09/27/2018    Physical Findings: AIMS:  , ,  ,  ,    CIWA:    COWS:     Musculoskeletal: Strength & Muscle Tone: within normal limits Gait & Station: normal Patient leans: N/A  Psychiatric Specialty Exam:  Presentation  General Appearance:  Disheveled  Eye Contact: Fair  Speech: Normal Rate  Speech Volume: Increased  Handedness: Right   Mood and Affect  Mood: Depressed  Affect: Blunt; Tearful   Thought Process  Thought Processes: Coherent  Descriptions of Associations:Intact  Orientation:Full (Time, Place and Person)  Thought Content:Paranoid Ideation (Patient states since attack causing TBI he is paranoid that someone will try to hurt him again.)  History of Schizophrenia/Schizoaffective disorder:No  Duration of Psychotic  Symptoms:N/A  Hallucinations:No data recorded Ideas of Reference:None  Suicidal Thoughts:No data recorded Homicidal Thoughts:No data recorded  Sensorium  Memory: Immediate Fair; Recent Poor; Remote Poor  Judgment: Fair  Insight: Fair   Art therapist  Concentration: Fair  Attention Span: Good  Recall: Poor  Fund of Knowledge: Fair  Language: Good   Psychomotor Activity  Psychomotor Activity:No data recorded  Assets  Assets: Communication Skills; Financial Resources/Insurance; Housing   Sleep  Sleep:No data recorded   Blood pressure (!) 152/89, pulse 84, temperature 98.6 F (37 C), resp. rate 16, height 5\' 7"  (1.702 m), weight 73.7 kg, SpO2 98%. Body mass index is 25.45 kg/m.   Treatment Plan Summary: Daily contact with patient to assess and evaluate symptoms and progress in treatment, Medication management, and Plan continue current medications.  Sarina Ill, DO 07/24/2023, 1:48 PM

## 2023-07-24 NOTE — Progress Notes (Signed)
   07/24/23 1200  Psych Admission Type (Psych Patients Only)  Admission Status Voluntary  Psychosocial Assessment  Patient Complaints Depression  Eye Contact Fair  Facial Expression Flat  Affect Sad  Speech Logical/coherent  Interaction Assertive  Motor Activity Slow  Appearance/Hygiene In scrubs  Behavior Characteristics Cooperative  Mood Depressed  Thought Process  Coherency WDL  Content WDL  Delusions None reported or observed  Perception WDL  Hallucination None reported or observed  Judgment Impaired  Confusion None  Danger to Self  Current suicidal ideation? Denies  Agreement Not to Harm Self Yes  Description of Agreement verbal  Danger to Others  Danger to Others None reported or observed

## 2023-07-25 DIAGNOSIS — F332 Major depressive disorder, recurrent severe without psychotic features: Secondary | ICD-10-CM | POA: Diagnosis not present

## 2023-07-25 NOTE — Progress Notes (Signed)
   07/25/23 0615  15 Minute Checks  Location Bedroom  Visual Appearance Calm  Behavior Sleeping  Sleep (Behavioral Health Patients Only)  Calculate sleep? (Click Yes once per 24 hr at 0600 safety check) Yes

## 2023-07-25 NOTE — Group Note (Signed)
LCSW Group Therapy Note  Group Date: 07/25/2023 Start Time: 1308 End Time: 1345   Type of Therapy and Topic:  Group Therapy - Healthy vs Unhealthy Coping Skills  Participation Level:  Active   Description of Group The focus of this group was to determine what unhealthy coping techniques typically are used by group members and what healthy coping techniques would be helpful in coping with various problems. Patients were guided in becoming aware of the differences between healthy and unhealthy coping techniques. Patients were asked to identify 2-3 healthy coping skills they would like to learn to use more effectively.  Therapeutic Goals Patients learned that coping is what human beings do all day long to deal with various situations in their lives Patients defined and discussed healthy vs unhealthy coping techniques Patients identified their preferred coping techniques and identified whether these were healthy or unhealthy Patients determined 2-3 healthy coping skills they would like to become more familiar with and use more often. Patients provided support and ideas to each other   Summary of Patient Progress:  The patient attended group. Patient proved open to input from peers and feedback from [facility]. Patient demonstrated  insight into the subject matter, was respectful of peers, and participated throughout the entire session. The LCSWA played a round of coping skills BINGO with the patients. The patient stated that exhaling helps him. The patient stated that he walks away when the situation needs it.    Therapeutic Modalities Cognitive Behavioral Therapy Motivational Interviewing  Marshell Levan, [place] 07/25/2023  2:15 PM

## 2023-07-25 NOTE — Progress Notes (Signed)
Patient observed sitting in the day room interacting with peers and staff appropriately. Patient participated with shift assessment, denies SI/HI/AVH and pain. Pt. Reported increased anxiety and received PRN Vistaril 25 mg PO, medication effective. Patient med compliant, no acute distress noted and slept well throughout the night. Patient routine observations to continue to monitor safety.

## 2023-07-25 NOTE — Plan of Care (Signed)
  Problem: Coping: Goal: Level of anxiety will decrease Outcome: Progressing   Problem: Pain Managment: Goal: General experience of comfort will improve Outcome: Progressing   

## 2023-07-25 NOTE — Group Note (Signed)
Date:  07/25/2023 Time:  12:07 PM  Group Topic/Focus:  Emotional Education:   The focus of this group is to discuss what feelings/emotions are, and how they are experienced.    Participation Level:  Active  Participation Quality:  Appropriate  Affect:  Appropriate  Cognitive:  Appropriate  Insight: Appropriate  Engagement in Group:  Engaged  Modes of Intervention:  Activity  Additional Comments:    Akaisha Truman 07/25/2023, 12:07 PM

## 2023-07-25 NOTE — Group Note (Signed)
Date:  07/25/2023 Time:  2:57 PM  Group Topic/Focus:  Self Care:   The focus of this group is to help patients understand the importance of self-care in order to improve or restore emotional, physical, spiritual, interpersonal, and financial health.    Participation Level:  Did Not Attend  Participation Quality:    Affect:    Cognitive:    Insight:   Engagement in Group:    Modes of Intervention:    Additional Comments:    Randall Harvey 07/25/2023, 2:57 PM

## 2023-07-25 NOTE — Progress Notes (Signed)
[facility] MD Progress Note  07/25/2023 12:09 PM Randall Harvey.  MRN:  [id number] Subjective: Randall Harvey is seen on rounds.  He is doing much better since starting Remeron.  He is sleeping better.  He expresses angst over being discharged to his boarding home which she says is [place] a [place] part of [place]. Principal Problem: MDD (major depressive disorder) Diagnosis: Principal Problem:   MDD (major depressive disorder) Active Problems:   MDD (major depressive disorder), recurrent severe, without psychosis (HCC)   Cocaine abuse (HCC)   Headache  Total Time spent with patient: 15 minutes  Past Psychiatric History: Depression and polysubstance abuse  Past Medical History:  Past Medical History:  Diagnosis Date   Alcohol intoxication (HCC)    Anticoagulant long-term use    lifetime use, coumadin therapy, then Xarelto   Anxiety    Arthritis    Cancer (HCC)    renal   Carotid atherosclerosis    <50% left and right   Clotting disorder (HCC)    COPD (chronic obstructive pulmonary disease) (HCC)    Depression    Elevated liver enzymes 11/2013   Family history of cancer    Fatty liver    H/O drug abuse (HCC)    H/O ETOH abuse    Hepatitis    History of traumatic brain injury    Hypertension    Lymphadenopathy    Obesity    Personal history of DVT (deep vein thrombosis) 06/2013   Pre-diabetes    Primary osteoarthritis of left knee    Pulmonary embolism and infarction (HCC) 06/2013   Pulmonary embolism and infarction (HCC) 11/2013   Pulmonary embolus with infarction [facility])    Renal cell carcinoma (HCC)    Renal mass, left 06/2013   with ureteral obstruction   Stroke (HCC)    Subarachnoid hemorrhage following injury (HCC)    Thrombocythemia    Thrombocytopenia (HCC)    Tobacco abuse     Past Surgical History:  Procedure Laterality Date   ANKLE FRACTURE SURGERY Right 12/2012   ANKLE SURGERY     EXCISION METACARPAL MASS Right 09/18/2016   Procedure: EXCISION METACARPAL MASS;  Surgeon:  Kennedy Bucker, MD;  Location: [facility] ORS;  Service: Orthopedics;  Laterality: Right;  5th digit    HERNIA REPAIR     Umbilical Hernia   ORBITAL FRACTURE SURGERY Left    ROBOTIC ASSITED PARTIAL NEPHRECTOMY Left Oct 2014   Family History:  Family History  Problem Relation Age of Onset   Pancreatic cancer Mother    Colon cancer Father    Diabetes Father    Colon cancer Sister    COPD Sister    Seizures Sister    Ovarian cancer Sister    Cancer Paternal Grandmother        bone   Stroke Paternal Grandfather    Heart disease Paternal Grandfather    Hypertension Neg Hx    Family Psychiatric  History: Unremarkable Social History:  Social History   Substance and Sexual Activity  Alcohol Use Yes   Comment: occasionally     Social History   Substance and Sexual Activity  Drug Use Yes   Types: Marijuana   Comment: occ    Social History   Socioeconomic History   Marital status: Divorced    Spouse name: Not on file   Number of children: Not on file   Years of education: Not on file   Highest education level: Not on file  Occupational History   Not  on file  Tobacco Use   Smoking status: Every Day    Current packs/day: 0.50    Types: Cigarettes   Smokeless tobacco: Never  Vaping Use   Vaping status: Never Used  Substance and Sexual Activity   Alcohol use: Yes    Comment: occasionally   Drug use: Yes    Types: Marijuana    Comment: occ   Sexual activity: Yes  Other Topics Concern   Not on file  Social History Narrative   ** Merged History Encounter **       Social Determinants of Health   Financial Resource Strain: Not on file  Food Insecurity: Food Insecurity Present (07/15/2023)   Hunger Vital Sign    Worried About Running Out of Food in the Last Year: Sometimes true    Ran Out of Food in the Last Year: Sometimes true  Transportation Needs: Unmet Transportation Needs (07/15/2023)   PRAPARE - Administrator, Civil Service (Medical): Yes    Lack of  Transportation (Non-Medical): Yes  Physical Activity: Not on file  Stress: Not on file  Social Connections: Not on file   Additional Social History:                         Sleep: Good  Appetite:  Good  Current Medications: Current Facility-Administered Medications  Medication Dose Route Frequency Provider Last Rate Last Admin   alum & mag hydroxide-simeth (MAALOX/MYLANTA) 200-200-20 MG/5ML suspension 30 mL  30 mL Oral Q4H PRN Lauree Chandler, NP       busPIRone (BUSPAR) tablet 5 mg  5 mg Oral BID Lewanda Rife, MD   5 mg at 07/25/23 0919   butalbital-acetaminophen-caffeine (FIORICET) 50-325-40 MG per tablet 1 tablet  1 tablet Oral Q6H PRN Emeline General, MD   1 tablet at 07/25/23 0926   diphenhydrAMINE (BENADRYL) capsule 50 mg  50 mg Oral TID PRN Lauree Chandler, NP       Or   diphenhydrAMINE (BENADRYL) injection 50 mg  50 mg Intramuscular TID PRN Lauree Chandler, NP       docusate sodium (COLACE) capsule 100 mg  100 mg Oral BID Lewanda Rife, MD   100 mg at 07/24/23 2118   feeding supplement (ENSURE ENLIVE / ENSURE PLUS) liquid 237 mL  237 mL Oral BID BM Sarina Ill, DO   237 mL at 07/25/23 0947   haloperidol (HALDOL) tablet 5 mg  5 mg Oral TID PRN Lauree Chandler, NP       Or   haloperidol lactate (HALDOL) injection 5 mg  5 mg Intramuscular TID PRN Lauree Chandler, NP       hydrOXYzine (ATARAX) tablet 25 mg  25 mg Oral TID PRN Lauree Chandler, NP   25 mg at 07/25/23 0926   LORazepam (ATIVAN) tablet 2 mg  2 mg Oral TID PRN Lauree Chandler, NP       Or   LORazepam (ATIVAN) injection 2 mg  2 mg Intramuscular TID PRN Lauree Chandler, NP       magnesium hydroxide (MILK OF MAGNESIA) suspension 30 mL  30 mL Oral Daily PRN Lauree Chandler, NP       meclizine (ANTIVERT) tablet 12.5 mg  12.5 mg Oral TID PRN Mikey College T, MD   12.5 mg at 07/24/23 1344   melatonin tablet 5 mg  5 mg Oral QHS PRN Lewanda Rife, MD   5 mg  at  07/23/23 2124   mirtazapine (REMERON) tablet 15 mg  15 mg Oral QHS Sarina Ill, DO   15 mg at 07/24/23 2118   multivitamin with minerals tablet 1 tablet  1 tablet Oral Daily Sarina Ill, DO   1 tablet at 07/25/23 1610   nicotine (NICODERM CQ - dosed in mg/24 hours) patch 14 mg  14 mg Transdermal Daily Lewanda Rife, MD   14 mg at 07/24/23 1026   sertraline (ZOLOFT) tablet 50 mg  50 mg Oral Daily Sarina Ill, DO   50 mg at 07/25/23 9604    Lab Results: No results found for this or any previous visit (from the past 48 hour(s)).  Blood Alcohol level:  Lab Results  Component Value Date   ETH <10 07/15/2023   ETH 250 (H) 01/13/2023    Metabolic Disorder Labs: Lab Results  Component Value Date   HGBA1C 5.6 08/20/2021   MPG 114 08/20/2021   MPG 146 09/27/2018   No results found for: "PROLACTIN" Lab Results  Component Value Date   CHOL 169 09/19/2021   TRIG 307 (H) 09/19/2021   HDL 49 09/19/2021   CHOLHDL 3.4 09/19/2021   VLDL NOT CALC 08/07/2016   LDLCALC 83 09/19/2021   LDLCALC 98 09/27/2018    Physical Findings: AIMS:  , ,  ,  ,    CIWA:    COWS:     Musculoskeletal: Strength & Muscle Tone: within normal limits Gait & Station: normal Patient leans: N/A  Psychiatric Specialty Exam:  Presentation  General Appearance:  Disheveled  Eye Contact: Fair  Speech: Normal Rate  Speech Volume: Increased  Handedness: Right   Mood and Affect  Mood: Depressed  Affect: Blunt; Tearful   Thought Process  Thought Processes: Coherent  Descriptions of Associations:Intact  Orientation:Full (Time, Place and Person)  Thought Content:Paranoid Ideation (Patient states since attack causing TBI he is paranoid that someone will try to hurt him again.)  History of Schizophrenia/Schizoaffective disorder:No  Duration of Psychotic Symptoms:N/A  Hallucinations:No data recorded Ideas of Reference:None  Suicidal Thoughts:No data  recorded Homicidal Thoughts:No data recorded  Sensorium  Memory: Immediate Fair; Recent Poor; Remote Poor  Judgment: Fair  Insight: Fair   Art therapist  Concentration: Fair  Attention Span: Good  Recall: Poor  Fund of Knowledge: Fair  Language: Good   Psychomotor Activity  Psychomotor Activity:No data recorded  Assets  Assets: Communication Skills; Financial Resources/Insurance; Housing   Sleep  Sleep:No data recorded    Blood pressure 131/81, pulse 77, temperature 98 F (36.7 C), resp. rate 16, height 5\' 7"  (1.702 m), weight 73.7 kg, SpO2 98%. Body mass index is 25.45 kg/m.   Treatment Plan Summary: Daily contact with patient to assess and evaluate symptoms and progress in treatment, Medication management, and Plan continue current medications.  Sarina Ill, DO 07/25/2023, 12:09 PM

## 2023-07-25 NOTE — Plan of Care (Signed)

## 2023-07-25 NOTE — Progress Notes (Addendum)
Patient present in the dayroom for breakfast.  Sad affect.  Endorses anxiety and depression.  Denies SI/HI and AVH.  Pain 7/10 in head.  PRN mediation given for pain.   Refused colace and nicotine patch.  Patient states he didn't need either of these medications today.  Compliant with all other medications. 15 min checks in place for safety.  Patient is present in the milieu.  Appropriate interaction with peers and staff.  Engages in groups/activities.   PRN medication given for anxiety and dizziness.

## 2023-07-26 DIAGNOSIS — F332 Major depressive disorder, recurrent severe without psychotic features: Secondary | ICD-10-CM | POA: Diagnosis not present

## 2023-07-26 NOTE — Progress Notes (Signed)
   07/26/23 0630  15 Minute Checks  Location Bedroom  Visual Appearance Calm  Behavior Sleeping  Sleep (Behavioral Health Patients Only)  Calculate sleep? (Click Yes once per 24 hr at 0600 safety check) Yes  Documented sleep last 24 hours 9.25

## 2023-07-26 NOTE — Progress Notes (Signed)
Patient with sad affect. Present in the dayroom for breakfast.  Endorses anxiety and depression.  Denies SI/HI and AVH.  Pain 8/10 in head.  Patient also reports dizziness. PRN medications given for pain and dizziness.   Compliant with scheduled medications. 15 min checks in place for safety.  Present in the milieu.  Appropriate interaction with peers and staff.    PRN for anxiety given towards endo of shift.

## 2023-07-26 NOTE — Progress Notes (Signed)
   07/26/23 0400  Psych Admission Type (Psych Patients Only)  Admission Status Voluntary  Psychosocial Assessment  Patient Complaints Anxiety;Depression  Eye Contact Fair  Facial Expression Anxious;Sad  Affect Sad  Speech Logical/coherent  Interaction Assertive  Motor Activity Slow  Appearance/Hygiene In scrubs  Behavior Characteristics Cooperative;Appropriate to situation  Mood Pleasant  Thought Process  Coherency WDL  Content WDL  Delusions None reported or observed  Perception WDL  Hallucination None reported or observed  Judgment Impaired  Confusion None  Danger to Self  Current suicidal ideation? Denies  Self-Injurious Behavior No self-injurious ideation or behavior indicators observed or expressed   Agreement Not to Harm Self Yes  Description of Agreement Verbal  Danger to Others  Danger to Others None reported or observed

## 2023-07-26 NOTE — Plan of Care (Signed)

## 2023-07-26 NOTE — Plan of Care (Signed)
  Problem: Education: Goal: Knowledge of General Education information will improve Description: Including pain rating scale, medication(s)/side effects and non-pharmacologic comfort measures Outcome: Progressing   Problem: Pain Managment: Goal: General experience of comfort will improve Outcome: Progressing   Problem: Safety: Goal: Ability to remain free from injury will improve Outcome: Progressing   

## 2023-07-26 NOTE — Progress Notes (Signed)
[facility] MD Progress Note  07/26/2023 12:16 PM Randall Harvey.  MRN:  [id number] Subjective: Randall Harvey is seen on rounds.  He is doing well on his medications.  I talked to him about social work returning on Monday and discussing discharge planning.  He is eating and sleeping well.  He denies any suicidal ideation.  Nurses report no problems.  His only complaint is his regular headache. Principal Problem: MDD (major depressive disorder) Diagnosis: Principal Problem:   MDD (major depressive disorder) Active Problems:   MDD (major depressive disorder), recurrent severe, without psychosis (HCC)   Cocaine abuse (HCC)   Headache  Total Time spent with patient: 15 minutes  Past Psychiatric History: Polysubstance abuse and depression  Past Medical History:  Past Medical History:  Diagnosis Date   Alcohol intoxication (HCC)    Anticoagulant long-term use    lifetime use, coumadin therapy, then Xarelto   Anxiety    Arthritis    Cancer (HCC)    renal   Carotid atherosclerosis    <50% left and right   Clotting disorder (HCC)    COPD (chronic obstructive pulmonary disease) (HCC)    Depression    Elevated liver enzymes 11/2013   Family history of cancer    Fatty liver    H/O drug abuse (HCC)    H/O ETOH abuse    Hepatitis    History of traumatic brain injury    Hypertension    Lymphadenopathy    Obesity    Personal history of DVT (deep vein thrombosis) 06/2013   Pre-diabetes    Primary osteoarthritis of left knee    Pulmonary embolism and infarction (HCC) 06/2013   Pulmonary embolism and infarction (HCC) 11/2013   Pulmonary embolus with infarction [facility])    Renal cell carcinoma (HCC)    Renal mass, left 06/2013   with ureteral obstruction   Stroke (HCC)    Subarachnoid hemorrhage following injury (HCC)    Thrombocythemia    Thrombocytopenia (HCC)    Tobacco abuse     Past Surgical History:  Procedure Laterality Date   ANKLE FRACTURE SURGERY Right 12/2012   ANKLE SURGERY      EXCISION METACARPAL MASS Right 09/18/2016   Procedure: EXCISION METACARPAL MASS;  Surgeon: Kennedy Bucker, MD;  Location: [facility] ORS;  Service: Orthopedics;  Laterality: Right;  5th digit    HERNIA REPAIR     Umbilical Hernia   ORBITAL FRACTURE SURGERY Left    ROBOTIC ASSITED PARTIAL NEPHRECTOMY Left Oct 2014   Family History:  Family History  Problem Relation Age of Onset   Pancreatic cancer Mother    Colon cancer Father    Diabetes Father    Colon cancer Sister    COPD Sister    Seizures Sister    Ovarian cancer Sister    Cancer Paternal Grandmother        bone   Stroke Paternal Grandfather    Heart disease Paternal Grandfather    Hypertension Neg Hx    Family Psychiatric  History: Unremarkable Social History:  Social History   Substance and Sexual Activity  Alcohol Use Yes   Comment: occasionally     Social History   Substance and Sexual Activity  Drug Use Yes   Types: Marijuana   Comment: occ    Social History   Socioeconomic History   Marital status: Divorced    Spouse name: Not on file   Number of children: Not on file   Years of education: Not on file  Highest education level: Not on file  Occupational History   Not on file  Tobacco Use   Smoking status: Every Day    Current packs/day: 0.50    Types: Cigarettes   Smokeless tobacco: Never  Vaping Use   Vaping status: Never Used  Substance and Sexual Activity   Alcohol use: Yes    Comment: occasionally   Drug use: Yes    Types: Marijuana    Comment: occ   Sexual activity: Yes  Other Topics Concern   Not on file  Social History Narrative   ** Merged History Encounter **       Social Determinants of Health   Financial Resource Strain: Not on file  Food Insecurity: Food Insecurity Present (07/15/2023)   Hunger Vital Sign    Worried About Running Out of Food in the Last Year: Sometimes true    Ran Out of Food in the Last Year: Sometimes true  Transportation Needs: Unmet Transportation Needs  (07/15/2023)   PRAPARE - Administrator, Civil Service (Medical): Yes    Lack of Transportation (Non-Medical): Yes  Physical Activity: Not on file  Stress: Not on file  Social Connections: Not on file   Additional Social History:                         Sleep: Good  Appetite:  Good  Current Medications: Current Facility-Administered Medications  Medication Dose Route Frequency Provider Last Rate Last Admin   alum & mag hydroxide-simeth (MAALOX/MYLANTA) 200-200-20 MG/5ML suspension 30 mL  30 mL Oral Q4H PRN Lauree Chandler, NP       busPIRone (BUSPAR) tablet 5 mg  5 mg Oral BID Lewanda Rife, MD   5 mg at 07/26/23 0947   butalbital-acetaminophen-caffeine (FIORICET) 50-325-40 MG per tablet 1 tablet  1 tablet Oral Q6H PRN Emeline General, MD   1 tablet at 07/26/23 0947   diphenhydrAMINE (BENADRYL) capsule 50 mg  50 mg Oral TID PRN Lauree Chandler, NP       Or   diphenhydrAMINE (BENADRYL) injection 50 mg  50 mg Intramuscular TID PRN Lauree Chandler, NP       docusate sodium (COLACE) capsule 100 mg  100 mg Oral BID Lewanda Rife, MD   100 mg at 07/24/23 2118   feeding supplement (ENSURE ENLIVE / ENSURE PLUS) liquid 237 mL  237 mL Oral BID BM Sarina Ill, DO   237 mL at 07/26/23 0951   haloperidol (HALDOL) tablet 5 mg  5 mg Oral TID PRN Lauree Chandler, NP       Or   haloperidol lactate (HALDOL) injection 5 mg  5 mg Intramuscular TID PRN Lauree Chandler, NP       hydrOXYzine (ATARAX) tablet 25 mg  25 mg Oral TID PRN Lauree Chandler, NP   25 mg at 07/25/23 2106   LORazepam (ATIVAN) tablet 2 mg  2 mg Oral TID PRN Lauree Chandler, NP       Or   LORazepam (ATIVAN) injection 2 mg  2 mg Intramuscular TID PRN Lauree Chandler, NP       magnesium hydroxide (MILK OF MAGNESIA) suspension 30 mL  30 mL Oral Daily PRN Lauree Chandler, NP       meclizine (ANTIVERT) tablet 12.5 mg  12.5 mg Oral TID PRN Mikey College T, MD   12.5 mg  at 07/26/23 0946   melatonin tablet 5 mg  5 mg Oral QHS PRN Lewanda Rife, MD   5 mg at 07/25/23 2107   mirtazapine (REMERON) tablet 15 mg  15 mg Oral QHS Sarina Ill, DO   15 mg at 07/25/23 2108   multivitamin with minerals tablet 1 tablet  1 tablet Oral Daily Sarina Ill, DO   1 tablet at 07/26/23 0946   nicotine (NICODERM CQ - dosed in mg/24 hours) patch 14 mg  14 mg Transdermal Daily Lewanda Rife, MD   14 mg at 07/24/23 1026   sertraline (ZOLOFT) tablet 50 mg  50 mg Oral Daily Sarina Ill, DO   50 mg at 07/26/23 0623    Lab Results: No results found for this or any previous visit (from the past 48 hour(s)).  Blood Alcohol level:  Lab Results  Component Value Date   ETH <10 07/15/2023   ETH 250 (H) 01/13/2023    Metabolic Disorder Labs: Lab Results  Component Value Date   HGBA1C 5.6 08/20/2021   MPG 114 08/20/2021   MPG 146 09/27/2018   No results found for: "PROLACTIN" Lab Results  Component Value Date   CHOL 169 09/19/2021   TRIG 307 (H) 09/19/2021   HDL 49 09/19/2021   CHOLHDL 3.4 09/19/2021   VLDL NOT CALC 08/07/2016   LDLCALC 83 09/19/2021   LDLCALC 98 09/27/2018    Physical Findings: AIMS:  , ,  ,  ,    CIWA:    COWS:     Musculoskeletal: Strength & Muscle Tone: within normal limits Gait & Station: normal Patient leans: N/A  Psychiatric Specialty Exam:  Presentation  General Appearance:  Disheveled  Eye Contact: Fair  Speech: Normal Rate  Speech Volume: Increased  Handedness: Right   Mood and Affect  Mood: Depressed  Affect: Blunt; Tearful   Thought Process  Thought Processes: Coherent  Descriptions of Associations:Intact  Orientation:Full (Time, Place and Person)  Thought Content:Paranoid Ideation (Patient states since attack causing TBI he is paranoid that someone will try to hurt him again.)  History of Schizophrenia/Schizoaffective disorder:No  Duration of Psychotic  Symptoms:N/A  Hallucinations:No data recorded Ideas of Reference:None  Suicidal Thoughts:No data recorded Homicidal Thoughts:No data recorded  Sensorium  Memory: Immediate Fair; Recent Poor; Remote Poor  Judgment: Fair  Insight: Fair   Art therapist  Concentration: Fair  Attention Span: Good  Recall: Poor  Fund of Knowledge: Fair  Language: Good   Psychomotor Activity  Psychomotor Activity:No data recorded  Assets  Assets: Communication Skills; Financial Resources/Insurance; Housing   Sleep  Sleep:No data recorded    Blood pressure 123/76, pulse 72, temperature 98.1 F (36.7 C), resp. rate 16, height 5\' 7"  (1.702 m), weight 73.7 kg, SpO2 98%. Body mass index is 25.45 kg/m.   Treatment Plan Summary: Daily contact with patient to assess and evaluate symptoms and progress in treatment, Medication management, and Plan continue current medications.  Sarina Ill, DO 07/26/2023, 12:16 PM

## 2023-07-26 NOTE — Group Note (Signed)
Date:  07/26/2023 Time:  10:27 PM  Group Topic/Focus:  Making Healthy Choices:   The focus of this group is to help patients identify negative/unhealthy choices they were using prior to admission and identify positive/healthier coping strategies to replace them upon discharge.    Participation Level:  Active  Participation Quality:  Appropriate  Affect:  Appropriate  Cognitive:  Appropriate  Insight: Appropriate  Engagement in Group:  Engaged  Modes of Intervention:  Discussion  Additional Comments:    Maeola Harman 07/26/2023, 10:27 PM

## 2023-07-27 DIAGNOSIS — F332 Major depressive disorder, recurrent severe without psychotic features: Secondary | ICD-10-CM | POA: Diagnosis not present

## 2023-07-27 MED ORDER — TAMSULOSIN HCL 0.4 MG PO CAPS
0.4000 mg | ORAL_CAPSULE | Freq: Every day | ORAL | Status: DC
  Administered 2023-07-27: 0.4 mg via ORAL
  Filled 2023-07-27: qty 1

## 2023-07-27 MED ORDER — BUSPIRONE HCL 5 MG PO TABS
10.0000 mg | ORAL_TABLET | Freq: Two times a day (BID) | ORAL | Status: DC
  Administered 2023-07-27 – 2023-07-28 (×2): 10 mg via ORAL
  Filled 2023-07-27 (×2): qty 2

## 2023-07-27 NOTE — Group Note (Signed)
Date:  07/27/2023 Time:  11:21 AM  Group Topic/Focus:  Developing a Wellness Toolbox:   The focus of this group is to help patients develop a "wellness toolbox" with skills and strategies to promote recovery upon discharge. Dimensions of Wellness:   The focus of this group is to introduce the topic of wellness and discuss the role each dimension of wellness plays in total health.    Participation Level:  Active  Participation Quality:  Appropriate  Affect:  Appropriate  Cognitive:  Alert and Appropriate  Insight: Appropriate  Engagement in Group:  Developing/Improving and Engaged  Modes of Intervention:  Activity, Discussion, and Education  Additional Comments:    Randall Harvey 07/27/2023, 11:21 AM

## 2023-07-27 NOTE — Plan of Care (Signed)
  Problem: Education: Goal: Knowledge of General Education information will improve Description Including pain rating scale, medication(s)/side effects and non-pharmacologic comfort measures Outcome: Progressing   Problem: Health Behavior/Discharge Planning: Goal: Ability to manage health-related needs will improve Outcome: Progressing   

## 2023-07-27 NOTE — Progress Notes (Signed)
   07/27/23 0829  Psych Admission Type (Psych Patients Only)  Admission Status Voluntary  Psychosocial Assessment  Patient Complaints Anxiety  Eye Contact Fair  Facial Expression Worried  Affect Anxious  Speech Logical/coherent  Interaction Needy  Motor Activity Slow  Appearance/Hygiene Disheveled  Behavior Characteristics Cooperative  Mood Pleasant  Thought Process  Coherency WDL  Content WDL  Delusions None reported or observed  Perception WDL  Hallucination None reported or observed  Judgment Impaired  Confusion None  Danger to Self  Current suicidal ideation? Denies  Danger to Others  Danger to Others None reported or observed

## 2023-07-27 NOTE — Plan of Care (Signed)

## 2023-07-27 NOTE — BH IP Treatment Plan (Signed)
Interdisciplinary Treatment and Diagnostic Plan Update  07/27/2023 Time of Session: 9:30 AM  Randall Harvey. MRN: [id number]  Principal Diagnosis: MDD (major depressive disorder)  Secondary Diagnoses: Principal Problem:   MDD (major depressive disorder) Active Problems:   MDD (major depressive disorder), recurrent severe, without psychosis (HCC)   Cocaine abuse (HCC)   Headache   Current Medications:  Current Facility-Administered Medications  Medication Dose Route Frequency Provider Last Rate Last Admin   alum & mag hydroxide-simeth (MAALOX/MYLANTA) 200-200-20 MG/5ML suspension 30 mL  30 mL Oral Q4H PRN Lauree Chandler, NP       busPIRone (BUSPAR) tablet 10 mg  10 mg Oral BID Sarina Ill, DO       butalbital-acetaminophen-caffeine (FIORICET) 250-123-9126 MG per tablet 1 tablet  1 tablet Oral Q6H PRN Emeline General, MD   1 tablet at 07/27/23 1478   diphenhydrAMINE (BENADRYL) capsule 50 mg  50 mg Oral TID PRN Lauree Chandler, NP       Or   diphenhydrAMINE (BENADRYL) injection 50 mg  50 mg Intramuscular TID PRN Lauree Chandler, NP       docusate sodium (COLACE) capsule 100 mg  100 mg Oral BID Lewanda Rife, MD   100 mg at 07/26/23 2152   feeding supplement (ENSURE ENLIVE / ENSURE PLUS) liquid 237 mL  237 mL Oral BID BM Sarina Ill, DO   237 mL at 07/27/23 0911   haloperidol (HALDOL) tablet 5 mg  5 mg Oral TID PRN Lauree Chandler, NP       Or   haloperidol lactate (HALDOL) injection 5 mg  5 mg Intramuscular TID PRN Lauree Chandler, NP       hydrOXYzine (ATARAX) tablet 25 mg  25 mg Oral TID PRN Lauree Chandler, NP   25 mg at 07/27/23 2956   LORazepam (ATIVAN) tablet 2 mg  2 mg Oral TID PRN Lauree Chandler, NP       Or   LORazepam (ATIVAN) injection 2 mg  2 mg Intramuscular TID PRN Lauree Chandler, NP       magnesium hydroxide (MILK OF MAGNESIA) suspension 30 mL  30 mL Oral Daily PRN Lauree Chandler, NP       meclizine  (ANTIVERT) tablet 12.5 mg  12.5 mg Oral TID PRN Mikey College T, MD   12.5 mg at 07/27/23 0902   melatonin tablet 5 mg  5 mg Oral QHS PRN Lewanda Rife, MD   5 mg at 07/26/23 2153   mirtazapine (REMERON) tablet 15 mg  15 mg Oral QHS Sarina Ill, DO   15 mg at 07/26/23 2152   multivitamin with minerals tablet 1 tablet  1 tablet Oral Daily Sarina Ill, DO   1 tablet at 07/27/23 2130   nicotine (NICODERM CQ - dosed in mg/24 hours) patch 14 mg  14 mg Transdermal Daily Lewanda Rife, MD   14 mg at 07/24/23 1026   sertraline (ZOLOFT) tablet 50 mg  50 mg Oral Daily Sarina Ill, DO   50 mg at 07/27/23 8657   tamsulosin (FLOMAX) capsule 0.4 mg  0.4 mg Oral QPC supper Sarina Ill, DO       PTA Medications: Medications Prior to Admission  Medication Sig Dispense Refill Last Dose   acetaminophen (TYLENOL) 500 MG tablet Take 2 tablets (1,000 mg total) by mouth every 8 (eight) hours as needed. (Patient not taking: Reported on 02/21/2022) 30 tablet 0  albuterol (VENTOLIN HFA) 108 (90 Base) MCG/ACT inhaler Inhale 2 puffs into the lungs every 6 (six) hours as needed for wheezing or shortness of breath. (Patient not taking: Reported on 02/21/2022) 8 g 0    docusate sodium (COLACE) 100 MG capsule Take 1 tablet once or twice daily as needed for constipation while taking narcotic pain medicine (Patient not taking: Reported on 02/21/2022) 30 capsule 0    fluticasone (FLONASE) 50 MCG/ACT nasal spray Place 2 sprays into both nostrils daily. (Patient not taking: Reported on 02/21/2022) 16 g 6    folic acid (FOLVITE) 1 MG tablet Take 1 tablet (1 mg total) by mouth daily. (Patient not taking: Reported on 02/21/2022) 30 tablet 0    Multiple Vitamin (MULTIVITAMIN WITH MINERALS) TABS tablet Take 1 tablet by mouth daily. (Patient not taking: Reported on 02/21/2022) 30 tablet 0    polyethylene glycol (MIRALAX / GLYCOLAX) 17 g packet Take 17 g by mouth daily. (Patient not taking:  Reported on 02/21/2022) 14 each 0    sertraline (ZOLOFT) 25 MG tablet Take 1 tablet (25 mg total) by mouth daily. (Patient not taking: Reported on 02/04/2022) 30 tablet 3    thiamine 100 MG tablet Take 1 tablet (100 mg total) by mouth daily. (Patient not taking: Reported on 02/21/2022) 30 tablet 0     Patient Stressors:    Patient Strengths:    Treatment Modalities: Medication Management, Group therapy, Case management,  1 to 1 session with clinician, Psychoeducation, Recreational therapy.   Physician Treatment Plan for Primary Diagnosis: MDD (major depressive disorder) Long Term Goal(s): Improvement in symptoms so as ready for discharge   Short Term Goals: Ability to identify changes in lifestyle to reduce recurrence of condition will improve Ability to verbalize feelings will improve Ability to disclose and discuss suicidal ideas Ability to demonstrate self-control will improve Ability to identify and develop effective coping behaviors will improve Ability to maintain clinical measurements within normal limits will improve Compliance with prescribed medications will improve Ability to identify triggers associated with substance abuse/mental health issues will improve  Medication Management: Evaluate patient's response, side effects, and tolerance of medication regimen.  Therapeutic Interventions: 1 to 1 sessions, Unit Group sessions and Medication administration.  Evaluation of Outcomes: Progressing  Physician Treatment Plan for Secondary Diagnosis: Principal Problem:   MDD (major depressive disorder) Active Problems:   MDD (major depressive disorder), recurrent severe, without psychosis (HCC)   Cocaine abuse (HCC)   Headache  Long Term Goal(s): Improvement in symptoms so as ready for discharge   Short Term Goals: Ability to identify changes in lifestyle to reduce recurrence of condition will improve Ability to verbalize feelings will improve Ability to disclose and discuss  suicidal ideas Ability to demonstrate self-control will improve Ability to identify and develop effective coping behaviors will improve Ability to maintain clinical measurements within normal limits will improve Compliance with prescribed medications will improve Ability to identify triggers associated with substance abuse/mental health issues will improve     Medication Management: Evaluate patient's response, side effects, and tolerance of medication regimen.  Therapeutic Interventions: 1 to 1 sessions, Unit Group sessions and Medication administration.  Evaluation of Outcomes: Adequate for Discharge   RN Treatment Plan for Primary Diagnosis: MDD (major depressive disorder) Long Term Goal(s): Knowledge of disease and therapeutic regimen to maintain health will improve  Short Term Goals: Ability to remain free from injury will improve, Ability to verbalize frustration and anger appropriately will improve, Ability to demonstrate self-control, Ability to participate in  decision making will improve, Ability to verbalize feelings will improve, Ability to disclose and discuss suicidal ideas, Ability to identify and develop effective coping behaviors will improve, and Compliance with prescribed medications will improve  Medication Management: RN will administer medications as ordered by provider, will assess and evaluate patient's response and provide education to patient for prescribed medication. RN will report any adverse and/or side effects to prescribing provider.  Therapeutic Interventions: 1 on 1 counseling sessions, Psychoeducation, Medication administration, Evaluate responses to treatment, Monitor vital signs and CBGs as ordered, Perform/monitor CIWA, COWS, AIMS and Fall Risk screenings as ordered, Perform wound care treatments as ordered.  Evaluation of Outcomes: Adequate for Discharge   LCSW Treatment Plan for Primary Diagnosis: MDD (major depressive disorder) Long Term Goal(s):  Safe transition to appropriate next level of care at discharge, Engage patient in therapeutic group addressing interpersonal concerns.  Short Term Goals: Engage patient in aftercare planning with referrals and resources, Increase social support, Increase ability to appropriately verbalize feelings, Increase emotional regulation, Facilitate acceptance of mental health diagnosis and concerns, Facilitate patient progression through stages of change regarding substance use diagnoses and concerns, Identify triggers associated with mental health/substance abuse issues, and Increase skills for wellness and recovery  Therapeutic Interventions: Assess for all discharge needs, 1 to 1 time with Social worker, Explore available resources and support systems, Assess for adequacy in community support network, Educate family and significant other(s) on suicide prevention, Complete Psychosocial Assessment, Interpersonal group therapy.  Evaluation of Outcomes: Adequate for Discharge   Progress in Treatment: Attending groups: Yes. and No. Participating in groups: Yes. and No. Taking medication as prescribed: Yes. Toleration medication: Yes. Family/Significant other contact made: Yes, individual(s) contacted:  Jola Schmidt, sister  Patient understands diagnosis: Yes. Discussing patient identified problems/goals with staff: Yes. Medical problems stabilized or resolved: Yes. Denies suicidal/homicidal ideation: Yes. Issues/concerns per patient self-inventory: No. Other: None  New problem(s) identified: No, Describe:  None identified Update 07/22/23: No changes at this time Update 07/27/23: No changes at this time    New Short Term/Long Term Goal(s):  elimination of symptoms of psychosis, medication management for mood stabilization; elimination of SI thoughts; development of comprehensive mental wellness plan. Update 07/22/23: No changes at this time Update 07/27/23: No changes at this time    Patient Goals:  "  Get better, stop this pain and dizziness and stuff" Update 07/22/23: No changes at this time Update 07/27/23: No changes at this time    Discharge Plan or Barriers: CSW will assist with appropriate discharge planning Update 07/22/23: Pt continues to endorse depression. Pt reports he has a boarding home that he can return to. Update 07/27/23: No changes at this time    Reason for Continuation of Hospitalization: Depression Medical Issues Medication stabilization   Estimated Length of Stay: 1 to 7 days Update 07/22/23: TBD Update 07/27/23: TBD  Last 3 [place] Suicide Severity Risk Score: Flowsheet Row ED from 07/15/2023 in [facility] Emergency Department at [facility] ED from 06/29/2023 in [facility] Emergency Department at [facility] ED from 02[facility]19/2023 in [facility] Emergency Department at [facility]  C-SSRS RISK CATEGORY No Risk No Risk No Risk       Last PHQ 2/9 Scores:    09/24/2021   11:36 AM 09/19/2021    1:07 PM 08/20/2021   11:35 AM  Depression screen PHQ 2/9  Decreased Interest 3 0 0  Down, Depressed, Hopeless 3 0 0  PHQ - 2 Score 6 0 0  Altered sleeping  1 0 0  Tired, decreased energy 1 0 0  Change in appetite 0 0 0  Feeling bad or failure about yourself  3 0 0  Trouble concentrating 0 0 0  Moving slowly or fidgety/restless 0 0 0  Suicidal thoughts 1 0 0  PHQ-9 Score 12 0 0  Difficult doing work/chores Extremely dIfficult Not difficult at all Not difficult at all    Scribe for Treatment Team: Elza Rafter, LCSWA 07/27/2023 4:20 PM

## 2023-07-27 NOTE — Progress Notes (Signed)
   07/27/23 2300  Psych Admission Type (Psych Patients Only)  Admission Status Voluntary  Psychosocial Assessment  Patient Complaints Anxiety  Eye Contact Fair  Facial Expression Sad  Affect Anxious  Speech Logical/coherent  Interaction Assertive  Motor Activity Slow  Appearance/Hygiene Disheveled  Behavior Characteristics Cooperative  Mood Pleasant  Thought Process  Coherency WDL  Content WDL  Delusions None reported or observed  Perception WDL  Hallucination None reported or observed  Judgment Impaired  Confusion None  Danger to Self  Current suicidal ideation? Denies  Agreement Not to Harm Self Yes  Description of Agreement verbal  Danger to Others  Danger to Others None reported or observed

## 2023-07-27 NOTE — Group Note (Signed)
Date:  07/27/2023 Time:  4:35 PM  Group Topic/Focus:  Dimensions of Wellness:   The focus of this group is to introduce the topic of wellness and discuss the role each dimension of wellness plays in total health.    Participation Level:  Active  Participation Quality:  Appropriate  Affect:  Appropriate  Cognitive:  Appropriate  Insight: Appropriate  Engagement in Group:  Engaged  Modes of Intervention:  Activity  Additional Comments:    Randall Harvey 07/27/2023, 4:35 PM

## 2023-07-27 NOTE — BHH Counselor (Addendum)
CSW had conversation with provider regarding pt's discharge.   Provider states pt can discharge today.   CSW [place] had conversation with pt regarding his discharge.   Pt reports he cannot discharge because he doesn't have money to get a bus ticket to go the courthouse in [place].   CSW [place] informed CSW [place], CSW [place] went to have a conversation with provider regarding pt's discharge.   Provider reports that pt does not have to discharge because the pt's insurance has not called yet.   CSW informed provider that pt has a court date that pt needed to make and CSW is unable to write a court letter for pt as it is not CSW's role.   Provider reports that he will not write a letter for pt to excuse pt's court date.   CSW informed pt that provider is unwilling to write court letter and that it would be imperative for him to discharge today to make his court date.   CSW encouraged pt to call his sister to see if she would be willing to taken him to his court date.   Pt reports he needs to stay until Friday when he gets his check. Pt reports he feels unsafe going home to get his things moved to a new place.   CSW suggests that pt should call the police to accompany him to his boarding house to have his things moved. CSW informed pt that this is an acute facility with a length of stay of 1 to 7 days.   Pt reports he cannot leave because his head is still hurting.   CSW informed pt that if his head is hurting he should be on a medical floor, not on a psychiatric floor.   Pt reports he cannot leave until Friday.   CSW informed provider, Dr.Herrick, who reports that pt can be discharged by incoming provider Dr.Parmar.   CSW suspects malingering behavior from pt and has informed provider on multiple occasions. CSW has attempted to have conversations with pt regarding discharge planning and pt does not engage as he reports he cannot go back to his boarding home but declines when CSW  attempts to give him a list of boardings homes.   ADDENDUM: Pt only wants to go to [facility], which CSW has previously told him may take 6 months to a year according to them.   CSW awaits further direction from providers about pt's discharge.

## 2023-07-27 NOTE — Group Note (Signed)
Recreation Therapy Group Note   Group Topic:Communication  Group Date: 07/27/2023 Start Time: 1400 End Time: 1455 Facilitators: Rosina Lowenstein, LRT, CTRS Location: Courtyard  Group Description: Emotional Check in. Patient sat and talked with LRT about how they are doing and whatever else is on their mind. LRT provided active listening, reassurance and encouragement. Pts were given the opportunity to listen to music or play cornhole while getting fresh air and sunlight in the courtyard.    Goal Area(s) Addressed: Patient will engage in conversation with LRT. Patient will communicate their wants, needs, or questions.  Patient will practice a new coping skill of "talking to someone".  Affect/Mood: Appropriate and Happy   Participation Level: Active and Engaged   Participation Quality: Independent   Behavior: Calm and Cooperative   Speech/Thought Process: Coherent   Insight: Fair   Judgement: Good   Modes of Intervention: Open Conversation   Patient Response to Interventions:  Attentive and Engaged   Education Outcome:  Acknowledges education   Clinical Observations/Individualized Feedback: Kumar was outside in group briefly. Pt shared that he doesn't like to be in one place too long and that he was going inside to walk around. Pt stated many times that the weather was "so nice and perfect out". Pt interacted well with LRT and peers while outside.   Plan: Continue to engage patient in RT group sessions 2-3x/week.   Rosina Lowenstein, LRT, CTRS 07/27/2023 3:25 PM
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
## 2023-07-28 ENCOUNTER — Emergency Department: Payer: Medicare HMO

## 2023-07-28 ENCOUNTER — Emergency Department
Admission: EM | Admit: 2023-07-28 | Discharge: 2023-07-29 | Disposition: A | Discharge status: Home / Self Care | Payer: Medicare HMO | Attending: Emergency Medicine | Admitting: Emergency Medicine

## 2023-07-28 ENCOUNTER — Other Ambulatory Visit: Payer: Self-pay

## 2023-07-28 DIAGNOSIS — R591 Generalized enlarged lymph nodes: Secondary | ICD-10-CM

## 2023-07-28 DIAGNOSIS — S022XXA Fracture of nasal bones, initial encounter for closed fracture: Secondary | ICD-10-CM | POA: Diagnosis not present

## 2023-07-28 DIAGNOSIS — W19XXXA Unspecified fall, initial encounter: Secondary | ICD-10-CM | POA: Insufficient documentation

## 2023-07-28 DIAGNOSIS — R45851 Suicidal ideations: Secondary | ICD-10-CM | POA: Diagnosis not present

## 2023-07-28 DIAGNOSIS — S199XXA Unspecified injury of neck, initial encounter: Secondary | ICD-10-CM | POA: Diagnosis not present

## 2023-07-28 DIAGNOSIS — R599 Enlarged lymph nodes, unspecified: Secondary | ICD-10-CM | POA: Diagnosis not present

## 2023-07-28 DIAGNOSIS — R590 Localized enlarged lymph nodes: Secondary | ICD-10-CM | POA: Diagnosis not present

## 2023-07-28 DIAGNOSIS — F10920 Alcohol use, unspecified with intoxication, uncomplicated: Secondary | ICD-10-CM | POA: Insufficient documentation

## 2023-07-28 DIAGNOSIS — F331 Major depressive disorder, recurrent, moderate: Secondary | ICD-10-CM | POA: Insufficient documentation

## 2023-07-28 DIAGNOSIS — S0990XA Unspecified injury of head, initial encounter: Secondary | ICD-10-CM | POA: Insufficient documentation

## 2023-07-28 DIAGNOSIS — M4802 Spinal stenosis, cervical region: Secondary | ICD-10-CM | POA: Diagnosis not present

## 2023-07-28 DIAGNOSIS — Y907 Blood alcohol level of 200-239 mg/100 ml: Secondary | ICD-10-CM | POA: Diagnosis not present

## 2023-07-28 DIAGNOSIS — Y92410 Unspecified street and highway as the place of occurrence of the external cause: Secondary | ICD-10-CM | POA: Insufficient documentation

## 2023-07-28 DIAGNOSIS — Z79899 Other long term (current) drug therapy: Secondary | ICD-10-CM | POA: Insufficient documentation

## 2023-07-28 DIAGNOSIS — Z043 Encounter for examination and observation following other accident: Secondary | ICD-10-CM | POA: Diagnosis not present

## 2023-07-28 DIAGNOSIS — J439 Emphysema, unspecified: Secondary | ICD-10-CM | POA: Diagnosis not present

## 2023-07-28 DIAGNOSIS — F411 Generalized anxiety disorder: Secondary | ICD-10-CM | POA: Diagnosis not present

## 2023-07-28 DIAGNOSIS — I6782 Cerebral ischemia: Secondary | ICD-10-CM | POA: Diagnosis not present

## 2023-07-28 DIAGNOSIS — F10129 Alcohol abuse with intoxication, unspecified: Secondary | ICD-10-CM | POA: Diagnosis not present

## 2023-07-28 DIAGNOSIS — F1410 Cocaine abuse, uncomplicated: Secondary | ICD-10-CM | POA: Diagnosis not present

## 2023-07-28 DIAGNOSIS — F332 Major depressive disorder, recurrent severe without psychotic features: Secondary | ICD-10-CM | POA: Diagnosis not present

## 2023-07-28 LAB — COMPREHENSIVE METABOLIC PANEL
ALT: 33 U/L (ref 0–44)
AST: 18 U/L (ref 15–41)
Albumin: 3.8 g/dL (ref 3.5–5.0)
Alkaline Phosphatase: 61 U/L (ref 38–126)
Anion gap: 12 (ref 5–15)
BUN: 23 mg/dL (ref 8–23)
CO2: 22 mmol/L (ref 22–32)
Calcium: 8.7 mg/dL — ABNORMAL LOW (ref 8.9–10.3)
Chloride: 100 mmol/L (ref 98–111)
Creatinine, Ser: 0.82 mg/dL (ref 0.61–1.24)
GFR, Estimated: >60 mL/min (ref >60)
Glucose, Bld: 177 mg/dL — ABNORMAL HIGH (ref 70–99)
Potassium: 3.4 mmol/L — ABNORMAL LOW (ref 3.5–5.1)
Sodium: 134 mmol/L — ABNORMAL LOW (ref 135–145)
Total Bilirubin: 0.5 mg/dL (ref 0.3–1.2)
Total Protein: 6.6 g/dL (ref 6.5–8.1)

## 2023-07-28 LAB — CBC WITH DIFFERENTIAL/PLATELET
Abs Immature Granulocytes: 0.04 10*3/uL (ref 0.00–0.07)
Basophils Absolute: 0.1 10*3/uL (ref 0.0–0.1)
Basophils Relative: 1 %
Eosinophils Absolute: 0.2 10*3/uL (ref 0.0–0.5)
Eosinophils Relative: 1 %
HCT: 42.9 % (ref 39.0–52.0)
Hemoglobin: 14.3 g/dL (ref 13.0–17.0)
Immature Granulocytes: 0 %
Lymphocytes Relative: 49 %
Lymphs Abs: 6.7 10*3/uL — ABNORMAL HIGH (ref 0.7–4.0)
MCH: 31.7 pg (ref 26.0–34.0)
MCHC: 33.3 g/dL (ref 30.0–36.0)
MCV: 95.1 fL (ref 80.0–100.0)
Monocytes Absolute: 1 10*3/uL (ref 0.1–1.0)
Monocytes Relative: 7 %
Neutro Abs: 5.7 10*3/uL (ref 1.7–7.7)
Neutrophils Relative %: 42 %
Platelets: 267 10*3/uL (ref 150–400)
RBC: 4.51 MIL/uL (ref 4.22–5.81)
RDW: 11.4 % — ABNORMAL LOW (ref 11.5–15.5)
Smear Review: NORMAL
WBC: 13.7 10*3/uL — ABNORMAL HIGH (ref 4.0–10.5)
nRBC: 0 % (ref 0.0–0.2)

## 2023-07-28 LAB — URINE DRUG SCREEN, QUALITATIVE (ARMC ONLY)
Amphetamines, Ur Screen: NOT DETECTED
Barbiturates, Ur Screen: POSITIVE — AB
Benzodiazepine, Ur Scrn: NOT DETECTED
Cannabinoid 50 Ng, Ur ~~LOC~~: NOT DETECTED
Cocaine Metabolite,Ur ~~LOC~~: NOT DETECTED
MDMA (Ecstasy)Ur Screen: NOT DETECTED
Methadone Scn, Ur: NOT DETECTED
Opiate, Ur Screen: NOT DETECTED
Phencyclidine (PCP) Ur S: NOT DETECTED
Tricyclic, Ur Screen: NOT DETECTED

## 2023-07-28 LAB — SALICYLATE LEVEL: Salicylate Lvl: <7 mg/dL — ABNORMAL LOW (ref 7.0–30.0)

## 2023-07-28 LAB — ETHANOL: Alcohol, Ethyl (B): 201 mg/dL — ABNORMAL HIGH (ref <10)

## 2023-07-28 LAB — ACETAMINOPHEN LEVEL: Acetaminophen (Tylenol), Serum: <10 ug/mL — ABNORMAL LOW (ref 10–30)

## 2023-07-28 MED ORDER — MECLIZINE HCL 12.5 MG PO TABS: 12.5000 mg | ORAL_TABLET | Freq: Three times a day (TID) | ORAL | 0 refills | Status: DC | PRN

## 2023-07-28 MED ORDER — MELATONIN 5 MG PO TABS: 5.0000 mg | ORAL_TABLET | Freq: Every evening | ORAL | 0 refills | Status: DC | PRN

## 2023-07-28 MED ORDER — BUTALBITAL-APAP-CAFFEINE 50-325-40 MG PO TABS: 1.0000 | ORAL_TABLET | Freq: Four times a day (QID) | ORAL | 0 refills | Status: DC | PRN

## 2023-07-28 MED ORDER — BUSPIRONE HCL 10 MG PO TABS: 10.0000 mg | ORAL_TABLET | Freq: Two times a day (BID) | ORAL | 0 refills | Status: DC

## 2023-07-28 MED ORDER — DROPERIDOL 2.5 MG/ML IJ SOLN
5.0000 mg | Freq: Once | INTRAMUSCULAR | Status: AC
  Administered 2023-07-28: 5 mg via INTRAMUSCULAR

## 2023-07-28 MED ORDER — SERTRALINE HCL 50 MG PO TABS: 50.0000 mg | ORAL_TABLET | Freq: Every day | ORAL | 0 refills | Status: DC

## 2023-07-28 MED ORDER — LORAZEPAM 2 MG/ML IJ SOLN
INTRAMUSCULAR | Status: AC
  Filled 2023-07-28: qty 1

## 2023-07-28 MED ORDER — DROPERIDOL 2.5 MG/ML IJ SOLN: 5.0000 mg | Freq: Once | INTRAMUSCULAR | Status: DC

## 2023-07-28 MED ORDER — TAMSULOSIN HCL 0.4 MG PO CAPS: 0.4000 mg | ORAL_CAPSULE | Freq: Every day | ORAL | 0 refills | Status: DC

## 2023-07-28 MED ORDER — MIRTAZAPINE 15 MG PO TABS: 15.0000 mg | ORAL_TABLET | Freq: Every day | ORAL | 0 refills | Status: DC

## 2023-07-28 MED ORDER — NICOTINE 14 MG/24HR TD PT24: 14.0000 mg | MEDICATED_PATCH | Freq: Every day | TRANSDERMAL | 0 refills | Status: DC

## 2023-07-28 MED ORDER — HYDROXYZINE HCL 25 MG PO TABS: 25.0000 mg | ORAL_TABLET | Freq: Three times a day (TID) | ORAL | 0 refills | Status: DC | PRN

## 2023-07-28 NOTE — Group Note (Signed)
Date:  07/28/2023 Time:  11:34 AM  Group Topic/Focus:  Goals Group/Karaoke   The focus of this group is to help patients establish daily goals to achieve during treatment and discuss how the patient can incorporate goal setting into their daily lives to aide in recovery. Also allowing patients to sing along to their favorite songs in [place]     Participation Level:  Active  Participation Quality:  Appropriate  Affect:  Appropriate  Cognitive:  Alert and Appropriate  Insight: Appropriate  Engagement in Group:  Engaged  Modes of Intervention:  Activity and Discussion  Additional Comments:    Marta Antu 07/28/2023, 11:34 AM

## 2023-07-28 NOTE — BH Assessment (Signed)
Pt unable to participate in assessment at this time. Psych team to follow up.

## 2023-07-28 NOTE — Group Note (Signed)
Date:  07/28/2023 Time:  2:33 AM  Group Topic/Focus:  Wellness Toolbox:   The focus of this group is to discuss various aspects of wellness, balancing those aspects and exploring ways to increase the ability to experience wellness.  Patients will create a wellness toolbox for use upon discharge.    Participation Level:  Active  Participation Quality:  Appropriate  Affect:  Appropriate  Cognitive:  Appropriate  Insight: Good  Engagement in Group:  Engaged  Modes of Intervention:  Discussion  Additional Comments:    Maeola Harman 07/28/2023, 2:33 AM

## 2023-07-28 NOTE — Plan of Care (Signed)

## 2023-07-28 NOTE — ED Notes (Signed)
Belongings:   1 red shirt 1 black tshirt 1 pair of black pants 1 pair of blue underwear 2 brown shoes 1 black belt 1 wallet 1 box of cigarrettes (empty) 3 dollars in front pocket 60 cents 1 chapstick 4 keys 1 wallet nothing in it

## 2023-07-28 NOTE — Plan of Care (Signed)
  Problem: Education: Goal: Knowledge of General Education information will improve Description: Including pain rating scale, medication(s)/side effects and non-pharmacologic comfort measures 07/28/2023 1440 by Doneen Poisson, RN Outcome: Adequate for Discharge 07/28/2023 1122 by Doneen Poisson, RN Outcome: Progressing   Problem: Health Behavior/Discharge Planning: Goal: Ability to manage health-related needs will improve 07/28/2023 1440 by Doneen Poisson, RN Outcome: Adequate for Discharge 07/28/2023 1122 by Doneen Poisson, RN Outcome: Progressing   Problem: Clinical Measurements: Goal: Ability to maintain clinical measurements within normal limits will improve 07/28/2023 1440 by Doneen Poisson, RN Outcome: Adequate for Discharge 07/28/2023 1122 by Doneen Poisson, RN Outcome: Progressing Goal: Will remain free from infection 07/28/2023 1440 by Doneen Poisson, RN Outcome: Adequate for Discharge 07/28/2023 1122 by Doneen Poisson, RN Outcome: Progressing Goal: Diagnostic test results will improve 07/28/2023 1440 by Doneen Poisson, RN Outcome: Adequate for Discharge 07/28/2023 1122 by Doneen Poisson, RN Outcome: Progressing Goal: Respiratory complications will improve 07/28/2023 1440 by Doneen Poisson, RN Outcome: Adequate for Discharge 07/28/2023 1122 by Doneen Poisson, RN Outcome: Progressing Goal: Cardiovascular complication will be avoided 07/28/2023 1440 by Doneen Poisson, RN Outcome: Adequate for Discharge 07/28/2023 1122 by Doneen Poisson, RN Outcome: Progressing   Problem: Activity: Goal: Risk for activity intolerance will decrease 07/28/2023 1440 by Doneen Poisson, RN Outcome: Adequate for Discharge 07/28/2023 1122 by Doneen Poisson, RN Outcome: Progressing   Problem: Nutrition: Goal: Adequate nutrition will be maintained 07/28/2023 1440 by Doneen Poisson, RN Outcome: Adequate for  Discharge 07/28/2023 1122 by Doneen Poisson, RN Outcome: Progressing   Problem: Coping: Goal: Level of anxiety will decrease 07/28/2023 1440 by Doneen Poisson, RN Outcome: Adequate for Discharge 07/28/2023 1122 by Doneen Poisson, RN Outcome: Progressing   Problem: Elimination: Goal: Will not experience complications related to bowel motility 07/28/2023 1440 by Doneen Poisson, RN Outcome: Adequate for Discharge 07/28/2023 1122 by Doneen Poisson, RN Outcome: Progressing Goal: Will not experience complications related to urinary retention 07/28/2023 1440 by Doneen Poisson, RN Outcome: Adequate for Discharge 07/28/2023 1122 by Doneen Poisson, RN Outcome: Progressing   Problem: Pain Managment: Goal: General experience of comfort will improve 07/28/2023 1440 by Doneen Poisson, RN Outcome: Adequate for Discharge 07/28/2023 1122 by Doneen Poisson, RN Outcome: Progressing   Problem: Safety: Goal: Ability to remain free from injury will improve 07/28/2023 1440 by Doneen Poisson, RN Outcome: Adequate for Discharge 07/28/2023 1122 by Doneen Poisson, RN Outcome: Progressing   Problem: Skin Integrity: Goal: Risk for impaired skin integrity will decrease 07/28/2023 1440 by Doneen Poisson, RN Outcome: Adequate for Discharge 07/28/2023 1122 by Doneen Poisson, RN Outcome: Progressing
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
## 2023-07-28 NOTE — Care Management Important Message (Signed)
Important Message  Patient Details  Name: Randall Harvey. MRN: [id number] Date of Birth: [birth year]   Important Message Given:  Yes - Medicare IM     Elza Rafter, LCSWA 07/28/2023, 10:29 AM
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
## 2023-07-28 NOTE — Progress Notes (Signed)

## 2023-07-28 NOTE — Progress Notes (Signed)
   07/28/23 0802  Psych Admission Type (Psych Patients Only)  Admission Status Voluntary  Psychosocial Assessment  Patient Complaints None  Eye Contact Fair  Facial Expression Worried  Affect Anxious  Speech Logical/coherent  Interaction Needy  Motor Activity Slow  Appearance/Hygiene Disheveled  Behavior Characteristics Cooperative;Calm  Mood Pleasant  Thought Process  Coherency WDL  Content WDL  Delusions None reported or observed  Perception WDL  Hallucination None reported or observed  Judgment Impaired  Confusion None  Danger to Self  Current suicidal ideation? Denies  Danger to Others  Danger to Others None reported or observed

## 2023-07-28 NOTE — Discharge Summary (Signed)
Physician Discharge Summary Note  Patient:  Randall Harvey. is an 62 y.o., male MRN:  [id number] DOB:  Aug 29, 1961 Patient phone:  [phone] (home)  Patient address:   [address] [place] [address],    Date of Admission:  07/15/2023 Date of Discharge: 07/28/2023  Reason for Admission:   Channing Mutters L. [place], [place]. is a 62 year old male with past psychiatric history of anxiety disorder, polysubstance use (EtOH, alcohol, marijuana, cocaine, barbiturates, tobacco), and alcohol induced mood disorder.  Randall Harvey initially presented to [facility] regional ED  after a fall, he was seen, and ultimately discharged to the ED lobby. Once in the lobby he reported thoughts of wanting to harm himself.   Principal Problem: MDD (major depressive disorder) Discharge Diagnoses: Principal Problem:   MDD (major depressive disorder) Active Problems:   MDD (major depressive disorder), recurrent severe, without psychosis (HCC)   Cocaine abuse (HCC)   Headache   Past Psychiatric History: Patient reports remote history of depression. Patient reports he is not treatment for depression. He denies past history of inpatient psychiatric treatment.   Past Medical History:  Past Medical History:  Diagnosis Date   Alcohol intoxication (HCC)    Anticoagulant long-term use    lifetime use, coumadin therapy, then Xarelto   Anxiety    Arthritis    Cancer (HCC)    renal   Carotid atherosclerosis    <50% left and right   Clotting disorder (HCC)    COPD (chronic obstructive pulmonary disease) (HCC)    Depression    Elevated liver enzymes 11/2013   Family history of cancer    Fatty liver    H/O drug abuse (HCC)    H/O ETOH abuse    Hepatitis    History of traumatic brain injury    Hypertension    Lymphadenopathy    Obesity    Personal history of DVT (deep vein thrombosis) 06/2013   Pre-diabetes    Primary osteoarthritis of left knee    Pulmonary embolism and infarction (HCC) 06/2013   Pulmonary embolism  and infarction (HCC) 11/2013   Pulmonary embolus with infarction [facility])    Renal cell carcinoma (HCC)    Renal mass, left 06/2013   with ureteral obstruction   Stroke (HCC)    Subarachnoid hemorrhage following injury (HCC)    Thrombocythemia    Thrombocytopenia (HCC)    Tobacco abuse     Past Surgical History:  Procedure Laterality Date   ANKLE FRACTURE SURGERY Right 12/2012   ANKLE SURGERY     EXCISION METACARPAL MASS Right 09/18/2016   Procedure: EXCISION METACARPAL MASS;  Surgeon: Kennedy Bucker, MD;  Location: [facility] ORS;  Service: Orthopedics;  Laterality: Right;  5th digit    HERNIA REPAIR     Umbilical Hernia   ORBITAL FRACTURE SURGERY Left    ROBOTIC ASSITED PARTIAL NEPHRECTOMY Left Oct 2014   Family History:  Family History  Problem Relation Age of Onset   Pancreatic cancer Mother    Colon cancer Father    Diabetes Father    Colon cancer Sister    COPD Sister    Seizures Sister    Ovarian cancer Sister    Cancer Paternal Grandmother        bone   Stroke Paternal Grandfather    Heart disease Paternal Grandfather    Hypertension Neg Hx    Social History:  Social History   Substance and Sexual Activity  Alcohol Use Yes   Comment: occasionally  Social History   Substance and Sexual Activity  Drug Use Yes   Types: Marijuana   Comment: occ    Social History   Socioeconomic History   Marital status: Divorced    Spouse name: Not on file   Number of children: Not on file   Years of education: Not on file   Highest education level: Not on file  Occupational History   Not on file  Tobacco Use   Smoking status: Every Day    Current packs/day: 0.50    Types: Cigarettes   Smokeless tobacco: Never  Vaping Use   Vaping status: Never Used  Substance and Sexual Activity   Alcohol use: Yes    Comment: occasionally   Drug use: Yes    Types: Marijuana    Comment: occ   Sexual activity: Yes  Other Topics Concern   Not on file  Social History Narrative    ** Merged History Encounter **       Social Determinants of Health   Financial Resource Strain: Not on file  Food Insecurity: Food Insecurity Present (07/15/2023)   Hunger Vital Sign    Worried About Running Out of Food in the Last Year: Sometimes true    Ran Out of Food in the Last Year: Sometimes true  Transportation Needs: Unmet Transportation Needs (07/15/2023)   PRAPARE - Administrator, Civil Service (Medical): Yes    Lack of Transportation (Non-Medical): Yes  Physical Activity: Not on file  Stress: Not on file  Social Connections: Not on file    Hospital Course:  The patient was admitted to Inpatient psychiatric treatment for stabilization of depression and suicide thoughts.  Patient was placed on suicidal precautions. The patient was evaluated and treated by the multidisciplinary treatment team including physicians, nurses, social workers and therapists. All medications were presented to the patient and the Patient gave consent to all the medications that they were given, as well as was explained the risks, benefits, side effects and alternatives of all medication therapies. The patient was integrated into the general milieu on the ward and encouraged to attend to his ADLs and participate in all groups and activities. During hospital course the Patient attended coping skill groups, music therapy and activity therapy groups. Patient was counseled on cognitive techniques/skills by multiple staff members and given support care by the staff.   Patient's medication regimen was evaluated and titrated to therapeutic levels to better Patient's overall daily functioning. Specifically, the patient was started on Zoloft and Remeron.  He tolerated the medication well with no significant side effects.  Hospitalist team was consulted to follow-up patient on subarachnoid hemorrhage and headaches.  Please refer to detailed note for details.  Patient was evaluated on the day of discharge.  He  denies thoughts of harming himself or others.  His affect was stable.  Please ignore Dr. Jasmine Awe note from previous few days prior to discharge, as the mental status of patient in Dr. Jasmine Awe note is copy paste from previous presentations and not the current presentation of the patient.  During the hospitalization, the patient demonstrated a stabilization of mood and depression with improved sleep and appetite. At the time of discharge, the patient denied any suicidal ideation/homicidal ideation and was not overtly depressed, manic or psychotic. The Patient was interacting well in groups and on the unit with their peers. Patient was able to identify a safety plan to include speaking with family, contacting outpatient provider or calling 911 if hallucinations/delusions returned  or worsened or thoughts of self-harm or suicide return. Patient was counselled on outpatient follow-up that was arranged prior to discharge.  Musculoskeletal: Strength & Muscle Tone: within normal limits Gait & Station: normal Patient leans: N/A   Psychiatric Specialty Exam:   Presentation  General Appearance:  Improved   Eye Contact: Fair   Speech: Normal Rate   Speech Volume: Normal   Handedness: Right     Mood and Affect  Mood: "Fine"   Affect: Stable, animated     Thought Process  Thought Processes: Coherent   Descriptions of Associations:Intact   Orientation:Full (Time, Place and Person)   Thought Content: Improved   History of Schizophrenia/Schizoaffective disorder:No   Duration of Psychotic Symptoms:N/A   Hallucinations:Hallucinations: None   Ideas of Reference:None   Suicidal Thoughts:: Denies no intention or plan   Homicidal Thoughts: Denies no intention or plan     Sensorium  Memory: Fair   Judgment: Fair, improved   Insight: Fair     Art therapist  Concentration: Fair   Attention Span: Good     Fund of Knowledge: Fair   Language: Good      Psychomotor Activity  Psychomotor Activity: Psychomotor Activity: Normal     Assets  Assets: Communication Skills; Financial Resources/Insurance; Housing     Sleep  Sleep: Sleep: Fair       Physical Exam: Physical Exam Vitals and nursing note reviewed.  HENT:     Head: Normocephalic and atraumatic.  Cardiovascular:     Rate and Rhythm: Normal rate.  Pulmonary:     Effort: Pulmonary effort is normal.  Neurological:     General: No focal deficit present.     Mental Status: He is alert and oriented to person, place, and time.      Review of Systems  Constitutional:  Negative for chills and fever.  HENT:  Positive for hearing loss.   Eyes:  Negative for blurred vision.  Respiratory:  Negative for shortness of breath.   Cardiovascular:  Negative for chest pain and palpitations.  Gastrointestinal:  Negative for nausea.  Neurological:  Positive for headaches.Although better with medicine   Blood pressure 114/71, pulse 87, temperature 98.4 F (36.9 C), resp. rate 20, height 5\' 7"  (1.702 m), weight 73.7 kg, SpO2 98%. Body mass index is 25.45 kg/m.   Social History   Tobacco Use  Smoking Status Every Day   Current packs/day: 0.50   Types: Cigarettes  Smokeless Tobacco Never   Tobacco Cessation:  A prescription for an FDA-approved tobacco cessation medication provided at discharge   Blood Alcohol level:  Lab Results  Component Value Date   ETH <10 07/15/2023   ETH 250 (H) 01/13/2023    Metabolic Disorder Labs:  Lab Results  Component Value Date   HGBA1C 5.6 08/20/2021   MPG 114 08/20/2021   MPG 146 09/27/2018   No results found for: "PROLACTIN" Lab Results  Component Value Date   CHOL 169 09/19/2021   TRIG 307 (H) 09/19/2021   HDL 49 09/19/2021   CHOLHDL 3.4 09/19/2021   VLDL NOT CALC 08/07/2016   LDLCALC 83 09/19/2021   LDLCALC 98 09/27/2018    See Psychiatric Specialty Exam and Suicide Risk Assessment completed by Attending Physician prior to  discharge.  Discharge destination:  Home  Is patient on multiple antipsychotic therapies at discharge:  No    Recommended Plan for Multiple Antipsychotic Therapies: NA   Allergies as of 07/28/2023   No Known Allergies  Medication List     STOP taking these medications    acetaminophen 500 MG tablet Commonly known as: TYLENOL   docusate sodium 100 MG capsule Commonly known as: Colace   polyethylene glycol 17 g packet Commonly known as: MIRALAX / GLYCOLAX       TAKE these medications      Indication  albuterol 108 (90 Base) MCG/ACT inhaler Commonly known as: VENTOLIN HFA Inhale 2 puffs into the lungs every 6 (six) hours as needed for wheezing or shortness of breath.    busPIRone 10 MG tablet Commonly known as: BUSPAR Take 1 tablet (10 mg total) by mouth 2 (two) times daily.    butalbital-acetaminophen-caffeine 50-325-40 MG tablet Commonly known as: FIORICET Take 1 tablet by mouth every 6 (six) hours as needed for headache.    fluticasone 50 MCG/ACT nasal spray Commonly known as: FLONASE Place 2 sprays into both nostrils daily.    folic acid 1 MG tablet Commonly known as: FOLVITE Take 1 tablet (1 mg total) by mouth daily.    hydrOXYzine 25 MG tablet Commonly known as: ATARAX Take 1 tablet (25 mg total) by mouth 3 (three) times daily as needed for anxiety.    meclizine 12.5 MG tablet Commonly known as: ANTIVERT Take 1 tablet (12.5 mg total) by mouth 3 (three) times daily as needed for dizziness.    melatonin 5 MG Tabs Take 1 tablet (5 mg total) by mouth at bedtime as needed.    mirtazapine 15 MG tablet Commonly known as: REMERON Take 1 tablet (15 mg total) by mouth at bedtime.    multivitamin with minerals Tabs tablet Take 1 tablet by mouth daily.    nicotine 14 mg/24hr patch Commonly known as: NICODERM CQ - dosed in mg/24 hours Place 1 patch (14 mg total) onto the skin daily. Start taking on: July 29, 2023    sertraline 50 MG  tablet Commonly known as: ZOLOFT Take 1 tablet (50 mg total) by mouth daily. Start taking on: July 29, 2023 What changed:  medication strength how much to take    tamsulosin 0.4 MG Caps capsule Commonly known as: FLOMAX Take 1 capsule (0.4 mg total) by mouth daily after supper.    thiamine 100 MG tablet Commonly known as: VITAMIN B1 Take 1 tablet (100 mg total) by mouth daily.         Follow-up Information     [facility], [facility]. Go to.   Why: Same-Day Access Hours:  Monday, Wednesday and Friday, 8am - 3pm Crisis & Diversion Center: Walk-In Crisis Hours: 7 days/week, 8am - 12am Contact information: [address] [place] [address] [phone]                PATIENTS CONDITION AT DISCHARGE:  Stable  TOBACCO CESSATION SCREENING  Patient was screened and counselled on smoking cessation at time of discharge.    PRESCRIPTION ARE LOCATED:  On Chart   DISCHARGE INSTRUCTIONS:  1. Diet: Cardiac healthy  2. Activity: As tolerated  3. Take medications as prescribed and not to make any changes without first consulting with the outpatient provider.  4. Patient was advised to avoid any illicit drugs or alcohol due to negative impact on physical and mental health.  5. Patient should keep all follow up appointments.   TIME SPENT ON DISCHARGE: Over 35 minutes were spent on this patients discharge including a face to face encounter, patient counseling and preparation of discharge materials.   Signed: Lewanda Rife, MD

## 2023-07-28 NOTE — Progress Notes (Signed)
   07/28/23 0543  15 Minute Checks  Location Bedroom  Visual Appearance Calm  Behavior Sleeping  Sleep (Behavioral Health Patients Only)  Calculate sleep? (Click Yes once per 24 hr at 0600 safety check) Yes  Documented sleep last 24 hours 8.25

## 2023-07-28 NOTE — BHH Suicide Risk Assessment (Signed)
[facility] Discharge Suicide Risk Assessment   Principal Problem: MDD (major depressive disorder) Discharge Diagnoses: Principal Problem:   MDD (major depressive disorder) Active Problems:   MDD (major depressive disorder), recurrent severe, without psychosis (HCC)   Cocaine abuse (HCC)   Headache    Musculoskeletal: Strength & Muscle Tone: within normal limits Gait & Station: normal Patient leans: N/A   Psychiatric Specialty Exam:   Presentation  General Appearance:  Improved   Eye Contact: Fair   Speech: Normal Rate   Speech Volume: Normal   Handedness: Right     Mood and Affect  Mood: "Fine"   Affect: Stable, animated     Thought Process  Thought Processes: Coherent   Descriptions of Associations:Intact   Orientation:Full (Time, Place and Person)   Thought Content: Improved   History of Schizophrenia/Schizoaffective disorder:No   Duration of Psychotic Symptoms:N/A   Hallucinations:Hallucinations: None   Ideas of Reference:None   Suicidal Thoughts:: Denies no intention or plan   Homicidal Thoughts: Denies no intention or plan     Sensorium  Memory: Fair   Judgment: Fair, improved   Insight: Fair     Art therapist  Concentration: Fair   Attention Span: Good     Fund of Knowledge: Fair   Language: Good     Psychomotor Activity  Psychomotor Activity: Psychomotor Activity: Normal     Assets  Assets: Communication Skills; Financial Resources/Insurance; Housing     Sleep  Sleep: Sleep: Fair       Physical Exam: Physical Exam Vitals and nursing note reviewed.  HENT:     Head: Normocephalic and atraumatic.  Cardiovascular:     Rate and Rhythm: Normal rate.  Pulmonary:     Effort: Pulmonary effort is normal.  Neurological:     General: No focal deficit present.     Mental Status: He is alert and oriented to person, place, and time.      Review of Systems  Constitutional:  Negative for chills and  fever.  HENT:  Positive for hearing loss.   Eyes:  Negative for blurred vision.  Respiratory:  Negative for shortness of breath.   Cardiovascular:  Negative for chest pain and palpitations.  Gastrointestinal:  Negative for nausea.  Neurological:  Positive for headaches.Although better with medicine   Blood pressure 114/71, pulse 87, temperature 98.4 F (36.9 C), resp. rate 20, height 5\' 7"  (1.702 m), weight 73.7 kg, SpO2 98%. Body mass index is 25.45 kg/m.  Demographic Factors:  Male, Caucasian, Living alone, and Unemployed  Loss Factors: Loss of significant relationship  Historical Factors: Impulsivity  Risk Reduction Factors:   Positive therapeutic relationship and Positive coping skills or problem solving skills  Continued Clinical Symptoms:  Alcohol/Substance Abuse/Dependencies  Cognitive Features That Contribute To Risk:  Thought constriction (tunnel vision)    Suicide Risk:  Minimal: No identifiable suicidal ideation.     Follow-up Information     [facility], [facility]. Go to.   Why: Same-Day Access Hours:  Monday, Wednesday and Friday, 8am - 3pm Crisis & Diversion Center: Walk-In Crisis Hours: 7 days/week, 8am - 12am Contact information: [address] [place] [address] [phone]                 Plan Of Care/Follow-up recommendations:  Per Discharge Summary  Lewanda Rife, MD

## 2023-07-28 NOTE — Progress Notes (Signed)
  [facility] Adult Case Management Discharge Plan :  Will you be returning to the same living situation after discharge:  Yes,  pt will return to his boarding home  At discharge, do you have transportation home?: Yes,  pt's sister will pick him up  Do you have the ability to pay for your medications: Yes,  [employer] MEDICARE / [employer] MEDICARE HMO  Release of information consent forms completed and in the chart;  Patient's signature needed at discharge.  Patient to Follow up at:  Follow-up Information     [facility], [facility]. Go to.   Why: Same-Day Access Hours:  Monday, Wednesday and Friday, 8am - 3pm Crisis & Diversion Center: Walk-In Crisis Hours: 7 days/week, 8am - 12am Contact information: [address] [place] [address] [phone]                 Next level of care provider has access to [facility] Link:no  Safety Planning and Suicide Prevention discussed: Yes,  Jola Schmidt, sister      Has patient been referred to the Quitline?: Patient refused referral for treatment  Patient has been referred for addiction treatment: Patient refused referral for treatment; referral information given to patient at discharge.  [address], LCSWA 07/28/2023, 10:25 AM

## 2023-07-29 DIAGNOSIS — S0990XA Unspecified injury of head, initial encounter: Secondary | ICD-10-CM | POA: Diagnosis not present

## 2023-07-29 DIAGNOSIS — R599 Enlarged lymph nodes, unspecified: Secondary | ICD-10-CM | POA: Diagnosis not present

## 2023-07-29 DIAGNOSIS — R45851 Suicidal ideations: Secondary | ICD-10-CM | POA: Diagnosis not present

## 2023-07-29 DIAGNOSIS — F10129 Alcohol abuse with intoxication, unspecified: Secondary | ICD-10-CM | POA: Diagnosis not present

## 2023-07-29 NOTE — ED Provider Notes (Signed)
-----------------------------------------   1:00 AM on 07/29/2023 -----------------------------------------   CT head negative for ICH.  CT cervical spine demonstrates increased lymphadenopathy concerning for lymphoma or metastatic disease.  ----------------------------------------- 1:21 AM on 07/29/2023 -----------------------------------------   Patient evaluated by overnight psychiatric NP who cleared him for discharge home with outpatient follow-up.  ----------------------------------------- 7:05 AM on 07/29/2023 -----------------------------------------   No events overnight.  Care transferred to the oncoming provider at change of shift.  Patient may be discharged home once he is sober and ambulatory with steady gait.   Irean Hong, MD 07/29/23 [phone]

## 2023-07-29 NOTE — ED Provider Notes (Signed)
-----------------------------------------   7:05 AM on 07/29/2023 -----------------------------------------  Blood pressure 134/76, pulse 72, temperature 98.7 F (37.1 C), temperature source Oral, resp. rate 19, height 5\' 7"  (1.702 m), weight 73 kg, SpO2 98%.  Assuming care from Dr. Dolores Frame.  In short, Randall Harvey is a 62 y.o. male with a chief complaint of Alcohol Intoxication and Suicidal .  Refer to the original H&P for additional details.  The current plan of care is to discharge home once clinically sober.  ----------------------------------------- 7:17 AM on 07/29/2023 ----------------------------------------- On reassessment, patient is clinically sober and denies any complaints.  He was informed of CT findings of worsening lymphadenopathy and need to follow-up with PCP.  He was counseled to return to the ED for new or worsening symptoms, patient agrees with plan.    Chesley Noon, MD 07/29/23 [phone]

## 2023-07-29 NOTE — Consult Note (Signed)
[facility] Consult Note  Patient Name: Randall Harvey. MRN: [id number] DOB: [birth year] DATE OF Consult: 07/29/2023  PRIMARY PSYCHIATRIC DIAGNOSES  1.  MDD, Recurrent, Moderate 2.  GAD 3.  Alcohol Use Disorder  RECOMMENDATIONS  Recommendations: Medication recommendations: Continue discharge medications as prescribed Non-Medication/therapeutic recommendations: Follow up with outpatient providers as scheduled Is inpatient psychiatric hospitalization recommended for this patient? No (Explain why): Denied SI/IP Is another care setting recommended for this patient? (examples may include Crisis Stabilization Unit, Residential/Recovery Treatment, ALF/SNF, Memory Care Unit)  No (Explain why): N/A From a psychiatric perspective, is this patient appropriate for discharge to an outpatient setting/resource or other less restrictive environment for continued care?  Yes (Explain why): Denied SI/IP Follow-Up Telepsychiatry C/L services: We will sign off for now. Please re-consult our service if needed for any concerning changes in the patient's condition, discharge planning, or questions. Communication: Treatment team members (and family members if applicable) who were involved in treatment/care discussions and planning, and with whom we spoke or engaged with via secure text/chat, include the following: Dr. Dolores Frame; Edwina Barth; [place]; LCAS  Thank you for involving [place] in the care of this patient. If you have any additional questions or concerns, please call [phone] and ask for me or the provider on-call.  TELEPSYCHIATRY ATTESTATION & CONSENT  As the provider for this telehealth consult, I attest that I verified the patient's identity using two separate identifiers, introduced myself to the patient, provided my credentials, disclosed my location, and performed this encounter via a HIPAA-compliant, real-time, face-to-face, two-way, interactive audio and video platform and with the full consent and  agreement of the patient (or guardian as applicable.)  Patient physical location: [facility] [place]. Telehealth provider physical location: home office in state of [place].  Video start time: 2348 (Central Time) Video end time: 0002 (Central Time)  IDENTIFYING DATA  Randall Harvey. is a 62 y.o. year-old male for whom a psychiatric consultation has been ordered by the primary provider. The patient was identified using two separate identifiers.  CHIEF COMPLAINT/REASON FOR CONSULT  Suicidal Ideation  HISTORY OF PRESENT ILLNESS (HPI)  The patient presented to the ED after a witnessed fall in public and c/o SI to EMS. Pt reportedly was discharged today from the inpatient psychiatric unit where he was admitted for depression and SI. Pt reportedly has a hx of ETOH and polysubstance abuse.  ETOH level was 201 on admission and UDS was positive for Barbiturates ( is prescribed Fioricet for migraines).       Upon evaluation, pt was notably still intoxicated with some irritability due to being woken up.  Reported he fell down and the next thing he knew, he was at the hospital.  Reported "I collapsed".  When asked about ETOH use today, he initially denied it until he was informed his BAL was 201.  He then reported drinking only a little bit.  When asked about current SI, pt denied any thoughts of suicide and reported he feels safe to return home with no concern of harm to himself.  Reported he attempted to pick up his Rx today several times but they weren't ready each time so he plans to pick them up in the morning.  Denied SI/IP, HI/IP, mania or psychosis.  Admits to some continued depression but was just started on some new medications.  Recommend continuing with discharge medications and follow up with outpatient providers as scheduled.    PAST PSYCHIATRIC HISTORY   Otherwise as per HPI above.  PAST  MEDICAL HISTORY  Past Medical History:  Diagnosis Date   Alcohol intoxication (HCC)    Anticoagulant long-term  use    lifetime use, coumadin therapy, then Xarelto   Anxiety    Arthritis    Cancer (HCC)    renal   Carotid atherosclerosis    <50% left and right   Clotting disorder (HCC)    COPD (chronic obstructive pulmonary disease) (HCC)    Depression    Elevated liver enzymes 11/2013   Family history of cancer    Fatty liver    H/O drug abuse (HCC)    H/O ETOH abuse    Hepatitis    History of traumatic brain injury    Hypertension    Lymphadenopathy    Obesity    Personal history of DVT (deep vein thrombosis) 06/2013   Pre-diabetes    Primary osteoarthritis of left knee    Pulmonary embolism and infarction (HCC) 06/2013   Pulmonary embolism and infarction (HCC) 11/2013   Pulmonary embolus with infarction [facility])    Renal cell carcinoma (HCC)    Renal mass, left 06/2013   with ureteral obstruction   Stroke (HCC)    Subarachnoid hemorrhage following injury (HCC)    Thrombocythemia    Thrombocytopenia (HCC)    Tobacco abuse      HOME MEDICATIONS  Facility Ordered Medications  Medication   [COMPLETED] droperidol (INAPSINE) 2.5 MG/ML injection 5 mg   PTA Medications  Medication Sig   folic acid (FOLVITE) 1 MG tablet Take 1 tablet (1 mg total) by mouth daily. (Patient not taking: Reported on 02/21/2022)   Multiple Vitamin (MULTIVITAMIN WITH MINERALS) TABS tablet Take 1 tablet by mouth daily. (Patient not taking: Reported on 02/21/2022)   thiamine 100 MG tablet Take 1 tablet (100 mg total) by mouth daily. (Patient not taking: Reported on 02/21/2022)   albuterol (VENTOLIN HFA) 108 (90 Base) MCG/ACT inhaler Inhale 2 puffs into the lungs every 6 (six) hours as needed for wheezing or shortness of breath. (Patient not taking: Reported on 02/21/2022)   fluticasone (FLONASE) 50 MCG/ACT nasal spray Place 2 sprays into both nostrils daily. (Patient not taking: Reported on 02/21/2022)   butalbital-acetaminophen-caffeine (FIORICET) 50-325-40 MG tablet Take 1 tablet by mouth every 6 (six) hours as  needed for headache.   busPIRone (BUSPAR) 10 MG tablet Take 1 tablet (10 mg total) by mouth 2 (two) times daily.   hydrOXYzine (ATARAX) 25 MG tablet Take 1 tablet (25 mg total) by mouth 3 (three) times daily as needed for anxiety.   mirtazapine (REMERON) 15 MG tablet Take 1 tablet (15 mg total) by mouth at bedtime.   nicotine (NICODERM CQ - DOSED IN MG/24 HOURS) 14 mg/24hr patch Place 1 patch (14 mg total) onto the skin daily.   sertraline (ZOLOFT) 50 MG tablet Take 1 tablet (50 mg total) by mouth daily.   meclizine (ANTIVERT) 12.5 MG tablet Take 1 tablet (12.5 mg total) by mouth 3 (three) times daily as needed for dizziness.   tamsulosin (FLOMAX) 0.4 MG CAPS capsule Take 1 capsule (0.4 mg total) by mouth daily after supper.   melatonin 5 MG TABS Take 1 tablet (5 mg total) by mouth at bedtime as needed.     ALLERGIES  No Known Allergies  SOCIAL & SUBSTANCE USE HISTORY  Social History   Socioeconomic History   Marital status: Divorced    Spouse name: Not on file   Number of children: Not on file   Years of education: Not on  file   Highest education level: Not on file  Occupational History   Not on file  Tobacco Use   Smoking status: Every Day    Current packs/day: 0.50    Types: Cigarettes   Smokeless tobacco: Never  Vaping Use   Vaping status: Never Used  Substance and Sexual Activity   Alcohol use: Yes    Comment: occasionally   Drug use: Yes    Types: Marijuana    Comment: occ   Sexual activity: Yes  Other Topics Concern   Not on file  Social History Narrative   ** Merged History Encounter **       Social Determinants of Health   Financial Resource Strain: Not on file  Food Insecurity: Food Insecurity Present (07/15/2023)   Hunger Vital Sign    Worried About Running Out of Food in the Last Year: Sometimes true    Ran Out of Food in the Last Year: Sometimes true  Transportation Needs: Unmet Transportation Needs (07/15/2023)   PRAPARE - Scientist, research (physical sciences) (Medical): Yes    Lack of Transportation (Non-Medical): Yes  Physical Activity: Not on file  Stress: Not on file  Social Connections: Not on file   Social History   Tobacco Use  Smoking Status Every Day   Current packs/day: 0.50   Types: Cigarettes  Smokeless Tobacco Never   Social History   Substance and Sexual Activity  Alcohol Use Yes   Comment: occasionally   Social History   Substance and Sexual Activity  Drug Use Yes   Types: Marijuana   Comment: occ    Additional pertinent information Lives alone in a boarding house.  FAMILY HISTORY  Family History  Problem Relation Age of Onset   Pancreatic cancer Mother    Colon cancer Father    Diabetes Father    Colon cancer Sister    COPD Sister    Seizures Sister    Ovarian cancer Sister    Cancer Paternal Grandmother        bone   Stroke Paternal Grandfather    Heart disease Paternal Grandfather    Hypertension Neg Hx    Family Psychiatric History (if known):  Unsure  MENTAL STATUS EXAM (MSE)  Presentation  General Appearance:  Disheveled  Eye Contact: Fleeting  Speech: Garbled; Slurred  Speech Volume: Increased  Handedness: Right   Mood and Affect  Mood: Irritable  Affect: Constricted   Thought Process  Thought Processes: Linear  Descriptions of Associations: Intact  Orientation: Full (Time, Place and Person)  Thought Content: Logical  History of Schizophrenia/Schizoaffective disorder: No  Duration of Psychotic Symptoms: N/A  Hallucinations:No data recorded Ideas of Reference: None  Suicidal Thoughts:Suicidal Thoughts: No  Homicidal Thoughts:Homicidal Thoughts: No   Sensorium  Memory: Immediate Fair; Recent Fair; Remote Fair  Judgment: Poor  Insight: Poor   Executive Functions  Concentration: Fair  Attention Span: Poor  Recall: [employer] of Knowledge: Fair  Language: Fair   Psychomotor Activity  Psychomotor  Activity:Psychomotor Activity: Restlessness  Assets  Assets: Housing; Social Support; Communication Skills   Sleep  Sleep:No data recorded  VITALS  Blood pressure 134/76, pulse 72, temperature 98.7 F (37.1 C), temperature source Oral, resp. rate 19, height 5\' 7"  (1.702 m), weight 73 kg, SpO2 98%.  LABS  Admission on 07/28/2023  Component Date Value Ref Range Status   WBC 07/28/2023 13.7 (H)  4.0 - 10.5 K/uL Final   RBC 07/28/2023 4.51  4.22 - 5.81  MIL/uL Final   Hemoglobin 07/28/2023 14.3  13.0 - 17.0 g/dL Final   HCT 32/44/0102 42.9  39.0 - 52.0 % Final   MCV 07/28/2023 95.1  80.0 - 100.0 fL Final   MCH 07/28/2023 31.7  26.0 - 34.0 pg Final   MCHC 07/28/2023 33.3  30.0 - 36.0 g/dL Final   RDW 72/53/6644 11.4 (L)  11.5 - 15.5 % Final   Platelets 07/28/2023 267  150 - 400 K/uL Final   nRBC 07/28/2023 0.0  0.0 - 0.2 % Final   Neutrophils Relative % 07/28/2023 42  % Final   Neutro Abs 07/28/2023 5.7  1.7 - 7.7 K/uL Final   Lymphocytes Relative 07/28/2023 49  % Final   Lymphs Abs 07/28/2023 6.7 (H)  0.7 - 4.0 K/uL Final   Monocytes Relative 07/28/2023 7  % Final   Monocytes Absolute 07/28/2023 1.0  0.1 - 1.0 K/uL Final   Eosinophils Relative 07/28/2023 1  % Final   Eosinophils Absolute 07/28/2023 0.2  0.0 - 0.5 K/uL Final   Basophils Relative 07/28/2023 1  % Final   Basophils Absolute 07/28/2023 0.1  0.0 - 0.1 K/uL Final   WBC Morphology 07/28/2023 MORPHOLOGY UNREMARKABLE   Final   RBC Morphology 07/28/2023 MORPHOLOGY UNREMARKABLE   Final   Smear Review 07/28/2023 Normal platelet morphology   Final   Immature Granulocytes 07/28/2023 0  % Final   Abs Immature Granulocytes 07/28/2023 0.04  0.00 - 0.07 K/uL Final   Performed at [facility], [address] [place], [place] [address]   Sodium 07/28/2023 134 (L)  135 - 145 mmol/L Final   Potassium 07/28/2023 3.4 (L)  3.5 - 5.1 mmol/L Final   Chloride 07/28/2023 100  98 - 111 mmol/L Final   CO2 07/28/2023 22  22 - 32  mmol/L Final   Glucose, Bld 07/28/2023 177 (H)  70 - 99 mg/dL Final   Glucose reference range applies only to samples taken after fasting for at least 8 hours.   BUN 07/28/2023 23  8 - 23 mg/dL Final   Creatinine, Ser 07/28/2023 0.82  0.61 - 1.24 mg/dL Final   Calcium 25/95/6387 8.7 (L)  8.9 - 10.3 mg/dL Final   Total Protein 56/43/3295 6.6  6.5 - 8.1 g/dL Final   Albumin 18/84/1660 3.8  3.5 - 5.0 g/dL Final   AST 63/09/6008 18  15 - 41 U/L Final   ALT 07/28/2023 33  0 - 44 U/L Final   Alkaline Phosphatase 07/28/2023 61  38 - 126 U/L Final   Total Bilirubin 07/28/2023 0.5  0.3 - 1.2 mg/dL Final   GFR, Estimated 07/28/2023 >60  >60 mL/min Final   Comment: (NOTE) Calculated using the CKD-EPI Creatinine Equation (2021)    Anion gap 07/28/2023 12  5 - 15 Final   Performed at [facility], [address] [place], [place] [address]   Alcohol, Ethyl (B) 07/28/2023 201 (H)  <10 mg/dL Final   Comment: (NOTE) Lowest detectable limit for serum alcohol is 10 mg/dL.  For medical purposes only. Performed at [facility], [address] [place], [place] [address]    Acetaminophen (Tylenol), Serum 07/28/2023 <10 (L)  10 - 30 ug/mL Final   Comment: (NOTE) Therapeutic concentrations vary significantly. A range of 10-30 ug/mL  may be an effective concentration for many patients. However, some  are best treated at concentrations outside of this range. Acetaminophen concentrations >150 ug/mL at 4 hours after ingestion  and >50 ug/mL at 12 hours  after ingestion are often associated with  toxic reactions.  Performed at [facility], [address] [place], [place] [address]    Salicylate Lvl 07/28/2023 <7.0 (L)  7.0 - 30.0 mg/dL Final   Performed at [facility], [address] [place], [place] [address]   Tricyclic, Ur Screen 07/28/2023 NONE DETECTED  NONE DETECTED Final   Amphetamines, Ur Screen 07/28/2023 NONE DETECTED  NONE DETECTED Final   MDMA  (Ecstasy)Ur Screen 07/28/2023 NONE DETECTED  NONE DETECTED Final   Cocaine Metabolite,Ur [place] 07/28/2023 NONE DETECTED  NONE DETECTED Final   Opiate, Ur Screen 07/28/2023 NONE DETECTED  NONE DETECTED Final   Phencyclidine (PCP) Ur S 07/28/2023 NONE DETECTED  NONE DETECTED Final   Cannabinoid 50 Ng, Ur [place] 07/28/2023 NONE DETECTED  NONE DETECTED Final   Barbiturates, Ur Screen 07/28/2023 POSITIVE (A)  NONE DETECTED Final   Benzodiazepine, Ur Scrn 07/28/2023 NONE DETECTED  NONE DETECTED Final   Methadone Scn, Ur 07/28/2023 NONE DETECTED  NONE DETECTED Final   Comment: (NOTE) Tricyclics + metabolites, urine    Cutoff 1000 ng/mL Amphetamines + metabolites, urine  Cutoff 1000 ng/mL MDMA (Ecstasy), urine              Cutoff 500 ng/mL Cocaine Metabolite, urine          Cutoff 300 ng/mL Opiate + metabolites, urine        Cutoff 300 ng/mL Phencyclidine (PCP), urine         Cutoff 25 ng/mL Cannabinoid, urine                 Cutoff 50 ng/mL Barbiturates + metabolites, urine  Cutoff 200 ng/mL Benzodiazepine, urine              Cutoff 200 ng/mL Methadone, urine                   Cutoff 300 ng/mL  The urine drug screen provides only a preliminary, unconfirmed analytical test result and should not be used for non-medical purposes. Clinical consideration and professional judgment should be applied to any positive drug screen result due to possible interfering substances. A more specific alternate chemical method must be used in order to obtain a confirmed analytical result. Gas chromatography / mass spectrometry (GC/MS) is the preferred confirm                          atory method. Performed at [facility], [address] [place], [place] [address]     PSYCHIATRIC REVIEW OF SYSTEMS (ROS)  ROS: Notable for the following relevant positive findings: Review of Systems  Constitutional: Negative.   HENT: Negative.    Eyes: Negative.   Respiratory: Negative.    Cardiovascular: Negative.    Gastrointestinal: Negative.   Genitourinary: Negative.   Musculoskeletal: Negative.   Skin: Negative.   Neurological:  Positive for headaches.  Endo/Heme/Allergies: Negative.   Psychiatric/Behavioral:  Positive for depression and substance abuse.     Additional findings:      Musculoskeletal: Impaired      Gait & Station: Wheelchair/Walker      Pain Screening: Denies      Nutrition & Dental Concerns: If yes - consider referral to nutritional or dental specialist  RISK FORMULATION/ASSESSMENT  Is the patient experiencing any suicidal or homicidal ideations: No       Explain if yes:  Protective factors considered for safety management: Social supports  Risk factors/concerns considered for safety  management:  Depression Substance abuse/dependence Age over 56 Male gender Unmarried  Is there a safety management plan with the patient and treatment team to minimize risk factors and promote protective factors: Yes           Explain: Resume home medications as prescribed, follow up as scheduled for outpatient psychiatric tx Is crisis care placement or psychiatric hospitalization recommended: No     Based on my current evaluation and risk assessment, patient is determined at this time to be at:  Low risk  *RISK ASSESSMENT Risk assessment is a dynamic process; it is possible that this patient's condition, and risk level, may change. This should be re-evaluated and managed over time as appropriate. Please re-consult psychiatric consult services if additional assistance is needed in terms of risk assessment and management. If your team decides to discharge this patient, please advise the patient how to best access emergency psychiatric services, or to call 911, if their condition worsens or they feel unsafe in any way.   Harlene Salts, NP Telepsychiatry Consult Services

## 2023-07-29 NOTE — ED Notes (Signed)
Taxi voucher received by Press photographer. Pt given personal belongings to prepare for discharge.

## 2023-07-29 NOTE — Discharge Instructions (Addendum)
Your CT scan shows enlarged lymph nodes in your neck concerning for lymphoma or metastatic disease.  Please follow-up closely with your PCP to further evaluate this.  Drink alcohol only in moderation.  Return to the ER for worsening symptoms, feelings of hurting yourself or others, or other concerns.

## 2023-07-29 NOTE — ED Notes (Signed)
Pt given breakfast tray and beverage.  

## 2023-07-29 NOTE — ED Notes (Signed)
vol/consult done/Per NP pt is psychiatrically cleared & can discharge when medically cleared.

## 2023-07-29 NOTE — ED Notes (Signed)
Pt resting on stretcher in hallway. MD at the bedside for pt re-evaluation to assess for possible discharge.

## 2023-07-29 NOTE — ED Notes (Addendum)
Psych NP speaking with patient via Tele[employer] [phone] at this time.

## 2023-07-29 NOTE — BH Assessment (Addendum)
Per psych NP Mathis Fare., pt is psychiatrically cleared and can discharge when medically sober.

## 2023-08-04 ENCOUNTER — Telehealth: Payer: Self-pay

## 2023-08-04 NOTE — Telephone Encounter (Signed)
Transition Care Management Unsuccessful Follow-up Telephone Call  Date of discharge and from where:  [place] 9/30  Attempts:  1st Attempt  Reason for unsuccessful TCM follow-up call:  Missing or invalid number   [facility]  [facility], [place] Guide, Phone: [phone] Website: [email or web address]

## 2023-11-27 ENCOUNTER — Emergency Department
Admission: EM | Admit: 2023-11-27 | Discharge: 2023-11-27 | Disposition: A | Discharge status: Home / Self Care | Payer: Medicare HMO | Attending: Emergency Medicine | Admitting: Emergency Medicine

## 2023-11-27 ENCOUNTER — Emergency Department: Payer: Medicare HMO

## 2023-11-27 ENCOUNTER — Other Ambulatory Visit: Payer: Self-pay

## 2023-11-27 DIAGNOSIS — Z23 Encounter for immunization: Secondary | ICD-10-CM | POA: Diagnosis not present

## 2023-11-27 DIAGNOSIS — S43014A Anterior dislocation of right humerus, initial encounter: Secondary | ICD-10-CM | POA: Insufficient documentation

## 2023-11-27 DIAGNOSIS — S4991XA Unspecified injury of right shoulder and upper arm, initial encounter: Secondary | ICD-10-CM | POA: Diagnosis present

## 2023-11-27 DIAGNOSIS — X58XXXA Exposure to other specified factors, initial encounter: Secondary | ICD-10-CM | POA: Insufficient documentation

## 2023-11-27 DIAGNOSIS — S80211A Abrasion, right knee, initial encounter: Secondary | ICD-10-CM | POA: Insufficient documentation

## 2023-11-27 MED ORDER — PROPOFOL 10 MG/ML IV BOLUS
1.0000 mg/kg | Freq: Once | INTRAVENOUS | Status: AC
  Administered 2023-11-27: 74.8 mg via INTRAVENOUS
  Filled 2023-11-27: qty 20

## 2023-11-27 MED ORDER — TETANUS-DIPHTH-ACELL PERTUSSIS 5-2.5-18.5 LF-MCG/0.5 IM SUSY
0.5000 mL | PREFILLED_SYRINGE | Freq: Once | INTRAMUSCULAR | Status: AC
  Administered 2023-11-27: 0.5 mL via INTRAMUSCULAR
  Filled 2023-11-27: qty 0.5

## 2023-11-27 MED ORDER — OXYCODONE-ACETAMINOPHEN 5-325 MG PO TABS: 1.0000 | ORAL_TABLET | Freq: Four times a day (QID) | ORAL | 0 refills | Status: AC | PRN

## 2023-11-27 MED ORDER — HYDROMORPHONE HCL 1 MG/ML IJ SOLN
1.0000 mg | Freq: Once | INTRAMUSCULAR | Status: AC
  Administered 2023-11-27: 1 mg via INTRAVENOUS
  Filled 2023-11-27: qty 1

## 2023-11-27 MED ORDER — KETOROLAC TROMETHAMINE 15 MG/ML IJ SOLN
15.0000 mg | Freq: Once | INTRAMUSCULAR | Status: AC
  Administered 2023-11-27: 15 mg via INTRAVENOUS
  Filled 2023-11-27: qty 1

## 2023-11-27 NOTE — ED Provider Notes (Signed)
 [facility] Provider Note    Event Date/Time   First MD Initiated Contact with Patient 11/27/23 1811     (approximate)   History   Shoulder Injury   HPI  Randall Harvey. is a 63 y.o. male no significant past medical history who presents to the emergency department with right shoulder pain.  Patient arrived via EMS.  Patient states that he was walking and fell and injured his right shoulder.  Received IV fentanyl with EMS and states that he has had ongoing severe pain to his right shoulder.  Denies head injury or loss of consciousness.  Does not believe that his tetanus is up-to-date.  Not on anticoagulation.  Does endorse tobacco use.     Physical Exam   Triage Vital Signs: ED Triage Vitals [11/27/23 1810]  Encounter Vitals Group     BP      Systolic BP Percentile      Diastolic BP Percentile      Pulse      Resp      Temp      Temp src      SpO2      Weight 165 lb (74.8 kg)     Height 5\' 7"  (1.702 m)     Head Circumference      Peak Flow      Pain Score 10     Pain Loc      Pain Education      Exclude from Growth Chart     Most recent vital signs: Vitals:   11/27/23 1946 11/27/23 1951  BP: (!) 144/87 (!) 147/91  Pulse: 75 73  Resp: 18 18  Temp:    SpO2: 100% 99%    Physical Exam Constitutional:      General: He is in acute distress.     Appearance: He is well-developed.  HENT:     Head: Atraumatic.  Eyes:     Conjunctiva/sclera: Conjunctivae normal.  Cardiovascular:     Rate and Rhythm: Regular rhythm.  Pulmonary:     Effort: No respiratory distress.  Abdominal:     Tenderness: There is no abdominal tenderness.  Musculoskeletal:     Cervical back: Normal range of motion. No tenderness.     Comments: Obvious deformity to the right shoulder.  Sensation intact to the right upper extremity.  +2 radial pulses.  Good grip strength.  No midline thoracic or lumbar tenderness to palpation  Skin:    General: Skin is warm.      Capillary Refill: Capillary refill takes less than 2 seconds.     Comments: Abrasion to the right knee.  No tenderness to palpation.  Neurological:     Mental Status: He is alert. Mental status is at baseline.      IMPRESSION / MDM / ASSESSMENT AND PLAN / ED COURSE  I reviewed the triage vital signs and the nursing notes.  Differential diagnosis including fracture, dislocation, AC separation   RADIOLOGY I independently reviewed imaging, my interpretation of imaging: X-ray of the right shoulder with what appears to be an anterior dislocation  X-ray of the right shoulder was read as anterior and inferior dislocation with no findings of acute fracture.   Labs (all labs ordered are listed, but only abnormal results are displayed) Labs interpreted as -    Labs Reviewed - No data to display  Procedural sedation with propofol.  Manipulation for right shoulder dislocation.  Repeat x-ray appears to be good anatomic  location and relocated.  No obvious fracture.  Patient's tetanus was updated.  Patient was placed in a sling.  Given a prescription for pain medication and discussed orthopedic follow-up.  Continues to be neurovascularly intact.  Given good return precautions.     PROCEDURES:  Critical Care performed: No  .Sedation  Date/Time: 11/27/2023 7:50 PM  Performed by: Corena Herter, MD Authorized by: Corena Herter, MD   Consent:    Consent obtained:  Verbal   Consent given by:  Patient   Risks discussed:  Allergic reaction, dysrhythmia, inadequate sedation, nausea, prolonged hypoxia resulting in organ damage, prolonged sedation necessitating reversal, respiratory compromise necessitating ventilatory assistance and intubation and vomiting   Alternatives discussed:  Analgesia without sedation, anxiolysis and regional anesthesia Universal protocol:    Procedure explained and questions answered to patient or proxy's satisfaction: yes     Relevant documents present and verified:  yes     Test results available: yes     Imaging studies available: yes     Required blood products, implants, devices, and special equipment available: yes     Site/side marked: yes     Immediately prior to procedure, a time out was called: yes     Patient identity confirmed:  Verbally with patient Indications:    Procedure necessitating sedation performed by:  Physician performing sedation Pre-sedation assessment:    Time since last food or drink:  > 3 hours   ASA classification: class 2 - patient with mild systemic disease     Mouth opening:  3 or more finger widths   Thyromental distance:  4 finger widths   Mallampati score:  I - soft palate, uvula, fauces, pillars visible   Neck mobility: normal     Pre-sedation assessments completed and reviewed: airway patency, cardiovascular function, hydration status, mental status, nausea/vomiting, pain level, respiratory function and temperature   A pre-sedation assessment was completed prior to the start of the procedure Immediate pre-procedure details:    Reassessment: Patient reassessed immediately prior to procedure     Reviewed: vital signs, relevant labs/tests and NPO status     Verified: bag valve mask available, emergency equipment available, intubation equipment available, IV patency confirmed, oxygen available and suction available   Procedure details (see MAR for exact dosages):    Preoxygenation:  Nasal cannula   Sedation:  Propofol   Intended level of sedation: deep   Analgesia:  Hydromorphone   Intra-procedure monitoring:  Blood pressure monitoring, cardiac monitor, continuous pulse oximetry, frequent LOC assessments, frequent vital sign checks and continuous capnometry   Intra-procedure events: none     Total Provider sedation time (minutes):  15 Post-procedure details:   A post-sedation assessment was completed following the completion of the procedure.   Attendance: Constant attendance by certified staff until patient  recovered     Recovery: Patient returned to pre-procedure baseline     Post-sedation assessments completed and reviewed: airway patency, cardiovascular function, hydration status, mental status, nausea/vomiting, pain level, respiratory function and temperature     Patient is stable for discharge or admission: yes     Procedure completion:  Tolerated well, no immediate complications Comments:     Apneic with propofol, required BVM and jaw thrust, desatted quickly to 74%, with BVM and jaw thrust came back up to 100%.   Patient's presentation is most consistent with acute presentation with potential threat to life or bodily function.   MEDICATIONS ORDERED IN ED: Medications  Tdap (BOOSTRIX) injection 0.5 mL (has no administration in  time range)  HYDROmorphone (DILAUDID) injection 1 mg (1 mg Intravenous Given 11/27/23 1830)  propofol (DIPRIVAN) 10 mg/mL bolus/IV push 74.8 mg (74.8 mg Intravenous Given 11/27/23 1927)    FINAL CLINICAL IMPRESSION(S) / ED DIAGNOSES   Final diagnoses:  Anterior dislocation of right shoulder, initial encounter     Rx / DC Orders   ED Discharge Orders          Ordered    oxyCODONE-acetaminophen (PERCOCET) 5-325 MG tablet  Every 6 hours PRN        11/27/23 1953             Note:  This document was prepared using Dragon voice recognition software and may include unintentional dictation errors.   Corena Herter, MD 11/27/23 Corky Crafts

## 2023-11-27 NOTE — ED Triage Notes (Signed)
 Pt brought in from home by EMS for RUE following a fall. fentanyl administered by EMS prior to arrival. Upon arrival to ED, pt is still complaining of 10/10 pain and requesting something for pain. Deformity to RUE noted.

## 2023-11-27 NOTE — ED Notes (Signed)
 Pt given sedation medications and required a safe ride home. Brother in law called to come pick up. This RN encouraged pt to wait in room but pt insistent that he needed to be in the waiting room for his ride. Pt education provided on the need for monitoring d/t medications given and procedure performed. Pt refused and requested to wait in the waiting room. Pt taken to waiting room at this time. Pt upset d/t stating that he had a pair of flip flops and shirt with him, but this RN was not able to find either of these belongings at the bedside. Pt encouraged to follow up with EMS company that brought him in. Pt currently in waiting room, VSS, denies complaints at this time.

## 2023-11-27 NOTE — Discharge Instructions (Addendum)
 You are seen in the emergency department for a right shoulder dislocation.  Your right shoulder was relocated in the emergency department.  Keep your arm in a sling for the next 3 days.  After 3 days start doing some early easy range of motion exercises to prevent frozen shoulder.  Call orthopedics and follow-up closely for reevaluation.  Your tetanus was updated today.  Pain control:  Ibuprofen (motrin/aleve/advil) - You can take 3 tablets (600 mg) every 6 hours as needed for pain/fever.  Acetaminophen (tylenol) - You can take 2 extra strength tablets (1000 mg) every 6 hours as needed for pain/fever.  You can alternate these medications or take them together.  Make sure you eat food/drink water when taking these medications.  You were given a prescription for narcotic pain medications.  Take only if in severe pain.  These are very addictive medications.  These medications can make you constipated.  If you need to take more than 1-2 doses, start a stool softner.  If you become constipated, take 1 capfull of MiraLAX, can repeat untill having regular bowel movements.  Keep this medication out of reach of any children.   Thank you for choosing [place] for your health care, it was my pleasure to care for you today!  Corena Herter, MD

## 2023-12-16 ENCOUNTER — Emergency Department

## 2023-12-16 ENCOUNTER — Emergency Department
Admission: EM | Admit: 2023-12-16 | Discharge: 2023-12-17 | Disposition: A | Discharge status: Home / Self Care | Attending: Emergency Medicine | Admitting: Emergency Medicine

## 2023-12-16 ENCOUNTER — Other Ambulatory Visit: Payer: Self-pay

## 2023-12-16 DIAGNOSIS — D72829 Elevated white blood cell count, unspecified: Secondary | ICD-10-CM | POA: Diagnosis not present

## 2023-12-16 DIAGNOSIS — J449 Chronic obstructive pulmonary disease, unspecified: Secondary | ICD-10-CM | POA: Insufficient documentation

## 2023-12-16 DIAGNOSIS — I4510 Unspecified right bundle-branch block: Secondary | ICD-10-CM | POA: Diagnosis not present

## 2023-12-16 DIAGNOSIS — J440 Chronic obstructive pulmonary disease with acute lower respiratory infection: Secondary | ICD-10-CM | POA: Diagnosis not present

## 2023-12-16 DIAGNOSIS — Z86711 Personal history of pulmonary embolism: Secondary | ICD-10-CM | POA: Insufficient documentation

## 2023-12-16 DIAGNOSIS — Y908 Blood alcohol level of 240 mg/100 ml or more: Secondary | ICD-10-CM | POA: Insufficient documentation

## 2023-12-16 DIAGNOSIS — F10129 Alcohol abuse with intoxication, unspecified: Secondary | ICD-10-CM | POA: Diagnosis not present

## 2023-12-16 DIAGNOSIS — R531 Weakness: Secondary | ICD-10-CM

## 2023-12-16 DIAGNOSIS — J189 Pneumonia, unspecified organism: Secondary | ICD-10-CM | POA: Diagnosis not present

## 2023-12-16 DIAGNOSIS — F10920 Alcohol use, unspecified with intoxication, uncomplicated: Secondary | ICD-10-CM

## 2023-12-16 DIAGNOSIS — R42 Dizziness and giddiness: Secondary | ICD-10-CM

## 2023-12-16 DIAGNOSIS — J181 Lobar pneumonia, unspecified organism: Secondary | ICD-10-CM | POA: Insufficient documentation

## 2023-12-16 LAB — CBC
HCT: 44.6 % (ref 39.0–52.0)
Hemoglobin: 14.8 g/dL (ref 13.0–17.0)
MCH: 33 pg (ref 26.0–34.0)
MCHC: 33.2 g/dL (ref 30.0–36.0)
MCV: 99.3 fL (ref 80.0–100.0)
Platelets: 148 10*3/uL — ABNORMAL LOW (ref 150–400)
RBC: 4.49 MIL/uL (ref 4.22–5.81)
RDW: 12.2 % (ref 11.5–15.5)
WBC: 12.1 10*3/uL — ABNORMAL HIGH (ref 4.0–10.5)
nRBC: 0 % (ref 0.0–0.2)

## 2023-12-16 LAB — URINE DRUG SCREEN, QUALITATIVE (ARMC ONLY)
Amphetamines, Ur Screen: NOT DETECTED
Barbiturates, Ur Screen: NOT DETECTED
Benzodiazepine, Ur Scrn: NOT DETECTED
Cannabinoid 50 Ng, Ur ~~LOC~~: NOT DETECTED
Cocaine Metabolite,Ur ~~LOC~~: NOT DETECTED
MDMA (Ecstasy)Ur Screen: NOT DETECTED
Methadone Scn, Ur: NOT DETECTED
Opiate, Ur Screen: NOT DETECTED
Phencyclidine (PCP) Ur S: NOT DETECTED
Tricyclic, Ur Screen: NOT DETECTED

## 2023-12-16 LAB — URINALYSIS, W/ REFLEX TO CULTURE (INFECTION SUSPECTED)
Bacteria, UA: NONE SEEN
Bilirubin Urine: NEGATIVE
Glucose, UA: NEGATIVE mg/dL
Hgb urine dipstick: NEGATIVE
Ketones, ur: NEGATIVE mg/dL
Leukocytes,Ua: NEGATIVE
Nitrite: NEGATIVE
Protein, ur: 30 mg/dL — AB
Specific Gravity, Urine: 1.029 (ref 1.005–1.030)
Squamous Epithelial / HPF: 0 /HPF (ref 0–5)
pH: 5 (ref 5.0–8.0)

## 2023-12-16 LAB — HEPATIC FUNCTION PANEL
ALT: 29 U/L (ref 0–44)
AST: 44 U/L — ABNORMAL HIGH (ref 15–41)
Albumin: 3.8 g/dL (ref 3.5–5.0)
Alkaline Phosphatase: 78 U/L (ref 38–126)
Bilirubin, Direct: 0.1 mg/dL (ref 0.0–0.2)
Indirect Bilirubin: 1 mg/dL — ABNORMAL HIGH (ref 0.3–0.9)
Total Bilirubin: 1.1 mg/dL (ref 0.0–1.2)
Total Protein: 6.9 g/dL (ref 6.5–8.1)

## 2023-12-16 LAB — TROPONIN I (HIGH SENSITIVITY): Troponin I (High Sensitivity): 7 ng/L (ref <18)

## 2023-12-16 LAB — BRAIN NATRIURETIC PEPTIDE: B Natriuretic Peptide: 11.5 pg/mL (ref 0.0–100.0)

## 2023-12-16 LAB — BASIC METABOLIC PANEL
Anion gap: 10 (ref 5–15)
BUN: 18 mg/dL (ref 8–23)
CO2: 24 mmol/L (ref 22–32)
Calcium: 8 mg/dL — ABNORMAL LOW (ref 8.9–10.3)
Chloride: 105 mmol/L (ref 98–111)
Creatinine, Ser: 0.92 mg/dL (ref 0.61–1.24)
GFR, Estimated: >60 mL/min (ref >60)
Glucose, Bld: 114 mg/dL — ABNORMAL HIGH (ref 70–99)
Potassium: 4 mmol/L (ref 3.5–5.1)
Sodium: 139 mmol/L (ref 135–145)

## 2023-12-16 LAB — ETHANOL: Alcohol, Ethyl (B): 300 mg/dL — ABNORMAL HIGH (ref <10)

## 2023-12-16 MED ORDER — AMOXICILLIN-POT CLAVULANATE 875-125 MG PO TABS
1.0000 | ORAL_TABLET | Freq: Once | ORAL | Status: AC
  Administered 2023-12-16: 1 via ORAL
  Filled 2023-12-16: qty 1

## 2023-12-16 MED ORDER — LACTATED RINGERS IV BOLUS
1000.0000 mL | Freq: Once | INTRAVENOUS | Status: AC
  Administered 2023-12-16: 1000 mL via INTRAVENOUS

## 2023-12-16 MED ORDER — AZITHROMYCIN 500 MG PO TABS
500.0000 mg | ORAL_TABLET | Freq: Once | ORAL | Status: AC
Start: 2023-12-16 — End: 2023-12-16
  Administered 2023-12-16: 500 mg via ORAL
  Filled 2023-12-16: qty 1

## 2023-12-16 MED ORDER — THIAMINE HCL 100 MG/ML IJ SOLN
100.0000 mg | Freq: Once | INTRAMUSCULAR | Status: AC
  Administered 2023-12-16: 100 mg via INTRAVENOUS
  Filled 2023-12-16: qty 2

## 2023-12-16 MED ORDER — AZITHROMYCIN 250 MG PO TABS: ORAL_TABLET | ORAL | 0 refills | Status: AC

## 2023-12-16 MED ORDER — AMOXICILLIN-POT CLAVULANATE 875-125 MG PO TABS: 1.0000 | ORAL_TABLET | Freq: Two times a day (BID) | ORAL | 0 refills | Status: AC

## 2023-12-16 NOTE — ED Notes (Signed)
 Pt up to nurses station stating "I need help I'm going to fall out". This RN advising pt to remain in wheelchair to prevent falls. Pt stating to RN, "well hell I only walked from here to there." This RN further stating if pt feels as if he is going to fall out then he needs to stay seated and ask for help to prevent injury.

## 2023-12-16 NOTE — ED Provider Notes (Signed)
-----------------------------------------   11:38 PM on 12/16/2023 -----------------------------------------   Assumed care of patient from Dr. Jodie Echevaria.  Patient is pending rest of lab work/UA.  Will continue to monitor and care for patient until sobriety.   ----------------------------------------- 1:21 AM on 12/17/2023 -----------------------------------------   Patient sleeping in no acute distress.  Respiratory panel and UDS negative.  Repeat troponin not necessary as patient was not complaining of chest pain.  ----------------------------------------- 6:53 AM on 12/17/2023 -----------------------------------------   Patient awake, drinking ginger ale.  Updated him on finishing antibiotic for community-acquired pneumonia.  Strict return precautions given.  Patient verbalizes understanding and agrees with plan of care.   Irean Hong, MD 12/17/23 [phone]

## 2023-12-16 NOTE — ED Triage Notes (Signed)
 First Nurse Note;  Pt via [facility] from local store. Pt c/o increased weakness and dizziness for the past 3-4 days. ETOH used today. Pt is A&Ox4 and NAD 135/84 BP  96% on RA  CBG 110  98.5 oral

## 2023-12-16 NOTE — Discharge Instructions (Signed)
 Please take your antibiotics as prescribed for your pneumonia.

## 2023-12-16 NOTE — ED Provider Notes (Signed)
[facility] Provider Note    Event Date/Time   First MD Initiated Contact with Patient 12/16/23 2136     (approximate)   History   Dizziness   HPI  Randall Harvey. is a 63 y.o. male with history of alcohol abuse, anxiety, COPD, history of PE not on anticoagulation presenting with weakness and lightheadedness for last 2 to 3 days.  He denies any new trauma or falls.  States that he drank a small amount of alcohol today.  No SI.  Does have a cough and some sore throat.  No fevers, chest pain, shortness of breath, leg swelling.  No other complaints.  Asking for something to eat.  Per EMS he was brought in from a local store, blood glucose was 110, afebrile, not hypoxic, normal blood pressures.  Independent history obtained from EMS.  On independent review, he did have a fall onto his right shoulder and found to have a right shoulder dislocation that was reduced.  Patient states that he is unable to really range his right shoulder but no new pain, weakness, numbness.  He was admitted to psych for SI in October of last year.  Does have history of polysubstance abuse.     Physical Exam   Triage Vital Signs: ED Triage Vitals  Encounter Vitals Group     BP 12/16/23 1620 116/80     Systolic BP Percentile --      Diastolic BP Percentile --      Pulse Rate 12/16/23 1620 92     Resp 12/16/23 1620 18     Temp 12/16/23 1620 98.8 F (37.1 C)     Temp Source 12/16/23 1620 Oral     SpO2 12/16/23 1620 95 %     Weight 12/16/23 1622 163 lb 2.3 oz (74 kg)     Height --      Head Circumference --      Peak Flow --      Pain Score 12/16/23 1620 7     Pain Loc --      Pain Education --      Exclude from Growth Chart --     Most recent vital signs: Vitals:   12/16/23 1620 12/16/23 2144  BP: 116/80 (!) 159/103  Pulse: 92 86  Resp: 18 19  Temp: 98.8 F (37.1 C) 98.5 F (36.9 C)  SpO2: 95% 96%     General: Awake, no distress.  CV:  Good peripheral  perfusion.  Resp:  Normal effort.  Abd:  No distention.  Other:  Excoriations to his face.  No palpable skull deformities or tenderness.  Reduced range of motion to his right shoulder, equal grip strength and sensation bilaterally, equal strength and sensation to his lower extremities, bilateral radial pulses are intact.  Lungs are clear bilaterally.  He has no unilateral calf swelling or tenderness.  Abdomen soft nontender.   ED Results / Procedures / Treatments   Labs (all labs ordered are listed, but only abnormal results are displayed) Labs Reviewed  BASIC METABOLIC PANEL - Abnormal; Notable for the following components:      Result Value   Glucose, Bld 114 (*)    Calcium 8.0 (*)    All other components within normal limits  CBC - Abnormal; Notable for the following components:   WBC 12.1 (*)    Platelets 148 (*)    All other components within normal limits  ETHANOL - Abnormal; Notable for the following components:  Alcohol, Ethyl (B) 300 (*)    All other components within normal limits  HEPATIC FUNCTION PANEL - Abnormal; Notable for the following components:   AST 44 (*)    Indirect Bilirubin 1.0 (*)    All other components within normal limits  RESP PANEL BY RT-PCR (RSV, FLU A&B, COVID)  RVPGX2  BRAIN NATRIURETIC PEPTIDE  URINALYSIS, W/ REFLEX TO CULTURE (INFECTION SUSPECTED)  URINE DRUG SCREEN, QUALITATIVE ([facility] ONLY)  TROPONIN I (HIGH SENSITIVITY)     EKG  EKG shows normal sinus rhythm, rate of 95, widened QRS, normal QTc, right bundle branch block, T wave version to V3, T wave flattening to V2, no ischemic ST elevation, not significantly changed compared to prior   RADIOLOGY Chest x-ray on my interpretation shows by basilar opacities.   PROCEDURES:  Critical Care performed: No  Procedures   MEDICATIONS ORDERED IN ED: Medications  lactated ringers bolus 1,000 mL (has no administration in time range)  thiamine (VITAMIN B1) injection 100 mg (100 mg  Intravenous Given 12/16/23 2309)  amoxicillin-clavulanate (AUGMENTIN) 875-125 MG per tablet 1 tablet (1 tablet Oral Given 12/16/23 2305)  azithromycin (ZITHROMAX) tablet 500 mg (500 mg Oral Given 12/16/23 2305)     IMPRESSION / MDM / ASSESSMENT AND PLAN / ED COURSE  I reviewed the triage vital signs and the nursing notes.                              Differential diagnosis includes, but is not limited to, alcohol intoxication, substance abuse, dehydration, electrolyte derangements, pneumonia, viral illness, UTI, no trauma or falls, no focal deficits on exam to suggest traumatic intracranial hemorrhage.  Did consider PE but he is not hypoxic, not tachypneic, not tachycardic, no unilateral calf swelling or tenderness.  Will get labs, EKG, troponin, chest x-ray, UA, urine drug screen, will give him some IV fluids with IV thiamine.   Patient's presentation is most consistent with acute presentation with potential threat to life or bodily function.  Independent review of labs and imaging are below.  Chest x-ray showed bibasilar opacities/ infiltrate given his cough, will treat for pneumonia.  Patient signed out to oncoming team pending IV fluids, rest of labs.  Likely able to be discharged after he Becomes more sober.  Antibiotics were sent to his pharmacy.  Clinical Course as of 12/16/23 2310  Wed Dec 16, 2023  2211 Independent review of labs, he is a mild leukocytosis, alcohol is 300, electrolytes not severely deranged, creatinine is normal. [TT]  2237 DG Chest 2 View Mild left basilar atelectasis and/or infiltrate.  [TT]    Clinical Course User Index [TT] Claybon Jabs, MD     FINAL CLINICAL IMPRESSION(S) / ED DIAGNOSES   Final diagnoses:  Pneumonia due to infectious organism, unspecified laterality, unspecified part of lung  Alcoholic intoxication without complication (HCC)  Weakness  Lightheaded     Rx / DC Orders   ED Discharge Orders          Ordered    azithromycin  (ZITHROMAX Z-PAK) 250 MG tablet        12/16/23 2241    amoxicillin-clavulanate (AUGMENTIN) 875-125 MG tablet  2 times daily        12/16/23 2241             Note:  This document was prepared using Dragon voice recognition software and may include unintentional dictation errors.    Claybon Jabs, MD 12/16/23  2310  

## 2023-12-17 LAB — RESP PANEL BY RT-PCR (RSV, FLU A&B, COVID)  RVPGX2
Influenza A by PCR: NEGATIVE
Influenza B by PCR: NEGATIVE
Resp Syncytial Virus by PCR: NEGATIVE
SARS Coronavirus 2 by RT PCR: NEGATIVE

## 2023-12-17 NOTE — ED Notes (Signed)
 Pt resting on stretcher in NAD, respirations even and unlabored

## 2023-12-17 NOTE — ED Notes (Signed)
Pt provided ginger ale per request.

## 2023-12-18 ENCOUNTER — Telehealth: Payer: Self-pay

## 2023-12-18 NOTE — Transitions of Care (Post Inpatient/ED Visit) (Signed)
   12/18/2023  Name: Randall Harvey. MRN: [id number] DOB: 1961-09-06  Today's TOC FU Call Status: Today's TOC FU Call Status:: Unsuccessful Call (1st Attempt) Unsuccessful Call (1st Attempt) Date: 12/18/23  Attempted to reach the patient regarding the most recent Inpatient/ED visit.  Follow Up Plan: Additional outreach attempts will be made to reach the patient to complete the Transitions of Care (Post Inpatient/ED visit) call.   Signature Karena Addison, LPN [facility] Nurse Health Advisor Direct Dial [phone]

## 2023-12-21 NOTE — Transitions of Care (Post Inpatient/ED Visit) (Unsigned)
   12/21/2023  Name: Randall Harvey. MRN: [id number] DOB: Aug 20, 1961  Today's TOC FU Call Status: Today's TOC FU Call Status:: Unsuccessful Call (2nd Attempt) Unsuccessful Call (1st Attempt) Date: 12/18/23 Unsuccessful Call (2nd Attempt) Date: 12/21/23  Attempted to reach the patient regarding the most recent Inpatient/ED visit.  Follow Up Plan: Additional outreach attempts will be made to reach the patient to complete the Transitions of Care (Post Inpatient/ED visit) call.   Signature Karena Addison, LPN [facility] Nurse Health Advisor Direct Dial [phone]

## 2023-12-22 NOTE — Transitions of Care (Post Inpatient/ED Visit) (Signed)
   12/22/2023  Name: Elza Sortor. MRN: [id number] DOB: [birth year]  Today's TOC FU Call Status: Today's TOC FU Call Status:: Unsuccessful Call (3rd Attempt) Unsuccessful Call (1st Attempt) Date: 12/18/23 Unsuccessful Call (2nd Attempt) Date: 12/21/23 Unsuccessful Call (3rd Attempt) Date: 12/22/23  Attempted to reach the patient regarding the most recent Inpatient/ED visit.  Follow Up Plan: No further outreach attempts will be made at this time. We have been unable to contact the patient.  Signature Karena Addison, LPN [facility] Nurse Health Advisor Direct Dial [phone]

## 2023-12-29 ENCOUNTER — Observation Stay
Admission: EM | Admit: 2023-12-29 | Discharge: 2023-12-31 | Disposition: A | Discharge status: Home / Self Care | Attending: Family Medicine | Admitting: Family Medicine

## 2023-12-29 ENCOUNTER — Other Ambulatory Visit: Payer: Self-pay

## 2023-12-29 ENCOUNTER — Emergency Department

## 2023-12-29 DIAGNOSIS — R072 Precordial pain: Secondary | ICD-10-CM | POA: Diagnosis not present

## 2023-12-29 DIAGNOSIS — I959 Hypotension, unspecified: Secondary | ICD-10-CM

## 2023-12-29 DIAGNOSIS — M25511 Pain in right shoulder: Secondary | ICD-10-CM | POA: Insufficient documentation

## 2023-12-29 DIAGNOSIS — R7989 Other specified abnormal findings of blood chemistry: Secondary | ICD-10-CM

## 2023-12-29 DIAGNOSIS — W19XXXA Unspecified fall, initial encounter: Secondary | ICD-10-CM | POA: Insufficient documentation

## 2023-12-29 DIAGNOSIS — F418 Other specified anxiety disorders: Secondary | ICD-10-CM

## 2023-12-29 DIAGNOSIS — R059 Cough, unspecified: Secondary | ICD-10-CM | POA: Insufficient documentation

## 2023-12-29 DIAGNOSIS — E1169 Type 2 diabetes mellitus with other specified complication: Secondary | ICD-10-CM

## 2023-12-29 DIAGNOSIS — I10 Essential (primary) hypertension: Secondary | ICD-10-CM | POA: Diagnosis not present

## 2023-12-29 DIAGNOSIS — Z85528 Personal history of other malignant neoplasm of kidney: Secondary | ICD-10-CM | POA: Diagnosis not present

## 2023-12-29 DIAGNOSIS — J209 Acute bronchitis, unspecified: Principal | ICD-10-CM

## 2023-12-29 DIAGNOSIS — F1410 Cocaine abuse, uncomplicated: Secondary | ICD-10-CM | POA: Diagnosis not present

## 2023-12-29 DIAGNOSIS — E669 Obesity, unspecified: Secondary | ICD-10-CM

## 2023-12-29 DIAGNOSIS — R0781 Pleurodynia: Secondary | ICD-10-CM | POA: Diagnosis present

## 2023-12-29 DIAGNOSIS — F32A Depression, unspecified: Secondary | ICD-10-CM | POA: Insufficient documentation

## 2023-12-29 DIAGNOSIS — R079 Chest pain, unspecified: Secondary | ICD-10-CM

## 2023-12-29 DIAGNOSIS — J441 Chronic obstructive pulmonary disease with (acute) exacerbation: Secondary | ICD-10-CM | POA: Diagnosis not present

## 2023-12-29 DIAGNOSIS — Z86718 Personal history of other venous thrombosis and embolism: Secondary | ICD-10-CM | POA: Insufficient documentation

## 2023-12-29 DIAGNOSIS — F1010 Alcohol abuse, uncomplicated: Secondary | ICD-10-CM | POA: Diagnosis not present

## 2023-12-29 DIAGNOSIS — I700 Atherosclerosis of aorta: Secondary | ICD-10-CM | POA: Diagnosis not present

## 2023-12-29 DIAGNOSIS — F1910 Other psychoactive substance abuse, uncomplicated: Secondary | ICD-10-CM | POA: Diagnosis not present

## 2023-12-29 DIAGNOSIS — R7401 Elevation of levels of liver transaminase levels: Secondary | ICD-10-CM | POA: Insufficient documentation

## 2023-12-29 DIAGNOSIS — Z8673 Personal history of transient ischemic attack (TIA), and cerebral infarction without residual deficits: Secondary | ICD-10-CM | POA: Insufficient documentation

## 2023-12-29 DIAGNOSIS — R590 Localized enlarged lymph nodes: Secondary | ICD-10-CM | POA: Diagnosis not present

## 2023-12-29 DIAGNOSIS — Z79899 Other long term (current) drug therapy: Secondary | ICD-10-CM | POA: Diagnosis not present

## 2023-12-29 DIAGNOSIS — F17210 Nicotine dependence, cigarettes, uncomplicated: Secondary | ICD-10-CM | POA: Insufficient documentation

## 2023-12-29 DIAGNOSIS — R0602 Shortness of breath: Secondary | ICD-10-CM | POA: Diagnosis present

## 2023-12-29 DIAGNOSIS — Z7951 Long term (current) use of inhaled steroids: Secondary | ICD-10-CM | POA: Insufficient documentation

## 2023-12-29 DIAGNOSIS — E119 Type 2 diabetes mellitus without complications: Secondary | ICD-10-CM | POA: Insufficient documentation

## 2023-12-29 DIAGNOSIS — F419 Anxiety disorder, unspecified: Secondary | ICD-10-CM | POA: Insufficient documentation

## 2023-12-29 DIAGNOSIS — E876 Hypokalemia: Secondary | ICD-10-CM | POA: Diagnosis not present

## 2023-12-29 DIAGNOSIS — S4991XS Unspecified injury of right shoulder and upper arm, sequela: Secondary | ICD-10-CM

## 2023-12-29 DIAGNOSIS — E872 Acidosis, unspecified: Secondary | ICD-10-CM | POA: Insufficient documentation

## 2023-12-29 DIAGNOSIS — Z86711 Personal history of pulmonary embolism: Secondary | ICD-10-CM | POA: Insufficient documentation

## 2023-12-29 DIAGNOSIS — Z720 Tobacco use: Secondary | ICD-10-CM

## 2023-12-29 DIAGNOSIS — I639 Cerebral infarction, unspecified: Secondary | ICD-10-CM | POA: Insufficient documentation

## 2023-12-29 DIAGNOSIS — I2699 Other pulmonary embolism without acute cor pulmonale: Secondary | ICD-10-CM | POA: Diagnosis present

## 2023-12-29 DIAGNOSIS — R911 Solitary pulmonary nodule: Secondary | ICD-10-CM | POA: Diagnosis not present

## 2023-12-29 LAB — COMPREHENSIVE METABOLIC PANEL WITH GFR
ALT: 24 U/L (ref 0–44)
AST: 27 U/L (ref 15–41)
Albumin: 3.9 g/dL (ref 3.5–5.0)
Alkaline Phosphatase: 77 U/L (ref 38–126)
Anion gap: 17 — ABNORMAL HIGH (ref 5–15)
BUN: 20 mg/dL (ref 8–23)
CO2: 18 mmol/L — ABNORMAL LOW (ref 22–32)
Calcium: 8.7 mg/dL — ABNORMAL LOW (ref 8.9–10.3)
Chloride: 102 mmol/L (ref 98–111)
Creatinine, Ser: 1.22 mg/dL (ref 0.61–1.24)
GFR, Estimated: >60 mL/min (ref >60)
Glucose, Bld: 223 mg/dL — ABNORMAL HIGH (ref 70–99)
Potassium: 3.1 mmol/L — ABNORMAL LOW (ref 3.5–5.1)
Sodium: 137 mmol/L (ref 135–145)
Total Bilirubin: 0.8 mg/dL (ref 0.0–1.2)
Total Protein: 7.1 g/dL (ref 6.5–8.1)

## 2023-12-29 LAB — CBC WITH DIFFERENTIAL/PLATELET
Abs Immature Granulocytes: 0.03 10*3/uL (ref 0.00–0.07)
Basophils Absolute: 0 10*3/uL (ref 0.0–0.1)
Basophils Relative: 0 %
Eosinophils Absolute: 0 10*3/uL (ref 0.0–0.5)
Eosinophils Relative: 0 %
HCT: 44.9 % (ref 39.0–52.0)
Hemoglobin: 15.2 g/dL (ref 13.0–17.0)
Immature Granulocytes: 0 %
Lymphocytes Relative: 46 %
Lymphs Abs: 3.9 10*3/uL (ref 0.7–4.0)
MCH: 33.3 pg (ref 26.0–34.0)
MCHC: 33.9 g/dL (ref 30.0–36.0)
MCV: 98.5 fL (ref 80.0–100.0)
Monocytes Absolute: 0.3 10*3/uL (ref 0.1–1.0)
Monocytes Relative: 4 %
Neutro Abs: 4.3 10*3/uL (ref 1.7–7.7)
Neutrophils Relative %: 50 %
Platelets: 186 10*3/uL (ref 150–400)
RBC: 4.56 MIL/uL (ref 4.22–5.81)
RDW: 12.2 % (ref 11.5–15.5)
WBC: 8.6 10*3/uL (ref 4.0–10.5)
nRBC: 0 % (ref 0.0–0.2)

## 2023-12-29 LAB — PROTIME-INR
INR: 1.1 (ref 0.8–1.2)
Prothrombin Time: 14.1 s (ref 11.4–15.2)

## 2023-12-29 LAB — LACTIC ACID, PLASMA
Lactic Acid, Venous: 4.3 mmol/L (ref 0.5–1.9)
Lactic Acid, Venous: 5.1 mmol/L (ref 0.5–1.9)

## 2023-12-29 LAB — ETHANOL: Alcohol, Ethyl (B): 146 mg/dL — ABNORMAL HIGH (ref <10)

## 2023-12-29 LAB — TROPONIN I (HIGH SENSITIVITY)
Troponin I (High Sensitivity): 9 ng/L (ref <18)
Troponin I (High Sensitivity): 10 ng/L (ref <18)

## 2023-12-29 MED ORDER — ONDANSETRON HCL 4 MG/2ML IJ SOLN: 4.0000 mg | Freq: Three times a day (TID) | INTRAMUSCULAR | Status: DC | PRN

## 2023-12-29 MED ORDER — IPRATROPIUM-ALBUTEROL 0.5-2.5 (3) MG/3ML IN SOLN
3.0000 mL | RESPIRATORY_TRACT | Status: DC
  Administered 2023-12-30 – 2023-12-31 (×8): 3 mL via RESPIRATORY_TRACT
  Filled 2023-12-29 (×9): qty 3

## 2023-12-29 MED ORDER — INSULIN ASPART 100 UNIT/ML IJ SOLN
0.0000 [IU] | Freq: Every day | INTRAMUSCULAR | Status: DC
Start: 2023-12-29 — End: 2023-12-31
  Administered 2023-12-30: 2 [IU] via SUBCUTANEOUS
  Filled 2023-12-29: qty 1

## 2023-12-29 MED ORDER — SODIUM CHLORIDE 0.9 % IV SOLN: INTRAVENOUS | Status: AC

## 2023-12-29 MED ORDER — LACTATED RINGERS IV BOLUS (SEPSIS)
1000.0000 mL | Freq: Once | INTRAVENOUS | Status: AC
  Administered 2023-12-29: 1000 mL via INTRAVENOUS

## 2023-12-29 MED ORDER — SODIUM CHLORIDE 0.9 % IV SOLN
2.0000 g | Freq: Once | INTRAVENOUS | Status: AC
  Administered 2023-12-29: 2 g via INTRAVENOUS
  Filled 2023-12-29: qty 20

## 2023-12-29 MED ORDER — THIAMINE HCL 100 MG/ML IJ SOLN
100.0000 mg | Freq: Every day | INTRAMUSCULAR | Status: DC
  Filled 2023-12-29: qty 2

## 2023-12-29 MED ORDER — ALBUTEROL SULFATE (2.5 MG/3ML) 0.083% IN NEBU
2.5000 mg | INHALATION_SOLUTION | Freq: Once | RESPIRATORY_TRACT | Status: AC
  Administered 2023-12-29: 2.5 mg via RESPIRATORY_TRACT
  Filled 2023-12-29: qty 3

## 2023-12-29 MED ORDER — INSULIN ASPART 100 UNIT/ML IJ SOLN
0.0000 [IU] | Freq: Three times a day (TID) | INTRAMUSCULAR | Status: DC
  Administered 2023-12-30 (×2): 3 [IU] via SUBCUTANEOUS
  Administered 2023-12-30: 5 [IU] via SUBCUTANEOUS
  Administered 2023-12-31: 1 [IU] via SUBCUTANEOUS
  Filled 2023-12-29 (×5): qty 1

## 2023-12-29 MED ORDER — LORAZEPAM 2 MG/ML IJ SOLN: 0.0000 mg | Freq: Two times a day (BID) | INTRAMUSCULAR | Status: DC

## 2023-12-29 MED ORDER — DM-GUAIFENESIN ER 30-600 MG PO TB12
1.0000 | ORAL_TABLET | Freq: Two times a day (BID) | ORAL | Status: DC | PRN
  Administered 2023-12-31: 1 via ORAL
  Filled 2023-12-29: qty 1

## 2023-12-29 MED ORDER — METHYLPREDNISOLONE SODIUM SUCC 125 MG IJ SOLR
125.0000 mg | Freq: Once | INTRAMUSCULAR | Status: AC
  Administered 2023-12-29: 125 mg via INTRAVENOUS
  Filled 2023-12-29: qty 2

## 2023-12-29 MED ORDER — POTASSIUM CHLORIDE CRYS ER 20 MEQ PO TBCR
40.0000 meq | EXTENDED_RELEASE_TABLET | Freq: Once | ORAL | Status: AC
  Administered 2023-12-30: 40 meq via ORAL
  Filled 2023-12-29: qty 2

## 2023-12-29 MED ORDER — FOLIC ACID 1 MG PO TABS
1.0000 mg | ORAL_TABLET | Freq: Every day | ORAL | Status: DC
  Administered 2023-12-30 – 2023-12-31 (×2): 1 mg via ORAL
  Filled 2023-12-29 (×2): qty 1

## 2023-12-29 MED ORDER — LORAZEPAM 2 MG/ML IJ SOLN: 1.0000 mg | INTRAMUSCULAR | Status: DC | PRN

## 2023-12-29 MED ORDER — SODIUM CHLORIDE 0.9 % IV SOLN
500.0000 mg | Freq: Once | INTRAVENOUS | Status: AC
  Administered 2023-12-29: 500 mg via INTRAVENOUS
  Filled 2023-12-29: qty 5

## 2023-12-29 MED ORDER — ENOXAPARIN SODIUM 40 MG/0.4ML IJ SOSY
40.0000 mg | PREFILLED_SYRINGE | INTRAMUSCULAR | Status: DC
  Administered 2023-12-30: 40 mg via SUBCUTANEOUS
  Filled 2023-12-29 (×2): qty 0.4

## 2023-12-29 MED ORDER — LORAZEPAM 2 MG/ML IJ SOLN
0.0000 mg | Freq: Four times a day (QID) | INTRAMUSCULAR | Status: DC
  Administered 2023-12-30: 1 mg via INTRAVENOUS
  Filled 2023-12-29: qty 1

## 2023-12-29 MED ORDER — NICOTINE 21 MG/24HR TD PT24
21.0000 mg | MEDICATED_PATCH | Freq: Every day | TRANSDERMAL | Status: DC
  Filled 2023-12-29 (×2): qty 1

## 2023-12-29 MED ORDER — LORAZEPAM 1 MG PO TABS
1.0000 mg | ORAL_TABLET | ORAL | Status: DC | PRN
  Administered 2023-12-30: 1 mg via ORAL
  Filled 2023-12-29: qty 1

## 2023-12-29 MED ORDER — LACTATED RINGERS IV BOLUS (SEPSIS)
250.0000 mL | Freq: Once | INTRAVENOUS | Status: AC
  Administered 2023-12-29: 250 mL via INTRAVENOUS

## 2023-12-29 MED ORDER — HYDRALAZINE HCL 20 MG/ML IJ SOLN: 5.0000 mg | INTRAMUSCULAR | Status: DC | PRN

## 2023-12-29 MED ORDER — ACETAMINOPHEN 325 MG PO TABS: 650.0000 mg | ORAL_TABLET | Freq: Four times a day (QID) | ORAL | Status: DC | PRN

## 2023-12-29 MED ORDER — ADULT MULTIVITAMIN W/MINERALS CH
1.0000 | ORAL_TABLET | Freq: Every day | ORAL | Status: DC
  Administered 2023-12-30 – 2023-12-31 (×2): 1 via ORAL
  Filled 2023-12-29 (×2): qty 1

## 2023-12-29 MED ORDER — ALBUTEROL SULFATE HFA 108 (90 BASE) MCG/ACT IN AERS: 2.0000 | INHALATION_SPRAY | RESPIRATORY_TRACT | Status: DC | PRN

## 2023-12-29 MED ORDER — THIAMINE MONONITRATE 100 MG PO TABS
100.0000 mg | ORAL_TABLET | Freq: Every day | ORAL | Status: DC
  Administered 2023-12-30 – 2023-12-31 (×2): 100 mg via ORAL
  Filled 2023-12-29 (×2): qty 1

## 2023-12-29 MED ORDER — FENTANYL CITRATE PF 50 MCG/ML IJ SOSY
50.0000 ug | PREFILLED_SYRINGE | Freq: Once | INTRAMUSCULAR | Status: AC
  Administered 2023-12-29: 50 ug via INTRAVENOUS
  Filled 2023-12-29: qty 1

## 2023-12-29 NOTE — Progress Notes (Signed)
 CODE SEPSIS - PHARMACY COMMUNICATION  **Broad Spectrum Antibiotics should be administered within 1 hour of Sepsis diagnosis**  Time Code Sepsis Called/Page Received: 2036  Antibiotics Ordered: Azithromycin, ceftriaxone  Time of 1st antibiotic administration: 2045  Additional action taken by pharmacy: N/A  If necessary, Name of Provider/Nurse Contacted: N/A    Merryl Hacker ,PharmD Clinical Pharmacist  12/29/2023  8:43 PM

## 2023-12-29 NOTE — ED Notes (Signed)
Inpatient provider at the bedside

## 2023-12-29 NOTE — Progress Notes (Signed)
 Elink monitoring for the code sepsis protocol.

## 2023-12-29 NOTE — H&P (Signed)
 History and Physical    Randall Harvey. WUJ:[id number] DOB: 1961-06-02 DOA: 12/29/2023  Referring MD/NP/PA:   PCP: Randall Mail, DO   Patient coming from:  The patient is coming from home.     Chief Complaint: SOB and chest pain  HPI: Randall Harvey. is a 63 y.o. male with medical history significant of polysubstance abuse (tobacco, alcohol, cocaine), HTN, DM, COPD, stroke, depression with anxiety, BPH, subdural hematoma, renal cell carcinoma, PE/DVT not on anticoagulants, medication noncompliance, who presents with SOB, chest pain.  Patient states that he has SOB in the past several weeks, which has been progressively worsening.  Patient has cough with yellow-colored sputum production.  He also reports left lateral chest pain, which is constant, sharp, moderate to severe, nonradiating, pleuritic, aggravated by deep breath and coughing.  No fever or chills.  Patient was seen in ED on 3/19, and diagnosed with pneumonia.  Patient was given prescription of antibiotics (Augmentin and azithromycin), but he has never picked up antibiotics.  No nausea, vomiting, diarrhea or abdominal pain.  No symptoms of UTI.  Patient states that he fell twice recently, he is not sure if he injured his head, but had no LOC.  Patient states that he is not taking any medications except for Aleve for pain.  Patient was initially hypotensive with blood pressure 64/48, which improved to 150/76 after giving 2.25 L LR in ED.  Data reviewed independently and ED Course: pt was found to have WBC 8.6, lactic acid of 4.3 --> 5.1, potassium 3.1, magnesium 1.9, phosphorus 2.9, GFR> 60, alcohol level 146, troponin 9 --> 10.  Temperature normal, heart rate 108, RR 18, oxygen saturation 98% on room air.  Chest x-ray negative.  Patient is placed in telemetry bed for observation.  CT of chest without contrast: Mild airway thickening may reflect bronchitis.   Coronary artery disease.   Mediastinal and bilateral axillary  adenopathy, increasing since prior study. Recommend follow-up CT in 6 months to assess stability.   Scattered pulmonary nodules, most of which are stable since prior study and compatible with benign nodules. There is a new left upper lobe nodule measuring 6 mm. Non-contrast chest CT at 3-6 months is recommended. If the nodules are stable at time of repeat CT, then future CT at 18-24 months (from today's scan) is considered optional for low-risk patients, but is recommended for high-risk patients. This recommendation follows the consensus statement: Guidelines for Management of Incidental Pulmonary Nodules Detected on CT Images: From the Fleischner Society 2017; Radiology 2017; 284:228-243.   Aortic Atherosclerosis (ICD10-I70.0).    EKG: I have personally reviewed.  Sinus rhythm, QTc 492, right bundle blockade, early lab progression   Review of Systems:   General: no fevers, chills, no body weight gain,  has fatigue HEENT: no blurry vision, hearing changes or sore throat Respiratory: has dyspnea, coughing, wheezing CV: has chest pain, no palpitations GI: no nausea, vomiting, abdominal pain, diarrhea, constipation GU: no dysuria, burning on urination, increased urinary frequency, hematuria  Ext: no leg edema Neuro: no unilateral weakness, numbness, or tingling, no vision change or hearing loss. Has fall Skin: no rash, no skin tear. MSK: No muscle spasm, no deformity, no limitation of range of movement in spin Heme: No easy bruising.  Travel history: No recent long distant travel.   Allergy: No Known Allergies  Past Medical History:  Diagnosis Date   Alcohol intoxication (HCC)    Anticoagulant long-term use    lifetime use, coumadin therapy, then  Xarelto   Anxiety    Arthritis    Cancer (HCC)    renal   Carotid atherosclerosis    <50% left and right   Clotting disorder (HCC)    COPD (chronic obstructive pulmonary disease) (HCC)    Depression    Elevated liver enzymes  11/2013   Family history of cancer    Fatty liver    H/O drug abuse (HCC)    H/O ETOH abuse    Hepatitis    History of traumatic brain injury    Hypertension    Lymphadenopathy    Obesity    Personal history of DVT (deep vein thrombosis) 06/2013   Pre-diabetes    Primary osteoarthritis of left knee    Pulmonary embolism and infarction (HCC) 06/2013   Pulmonary embolism and infarction (HCC) 11/2013   Pulmonary embolus with infarction [facility])    Renal cell carcinoma (HCC)    Renal mass, left 06/2013   with ureteral obstruction   Stroke (HCC)    Subarachnoid hemorrhage following injury (HCC)    Thrombocythemia    Thrombocytopenia (HCC)    Tobacco abuse     Past Surgical History:  Procedure Laterality Date   ANKLE FRACTURE SURGERY Right 12/2012   ANKLE SURGERY     EXCISION METACARPAL MASS Right 09/18/2016   Procedure: EXCISION METACARPAL MASS;  Surgeon: Kennedy Bucker, MD;  Location: [facility] ORS;  Service: Orthopedics;  Laterality: Right;  5th digit    HERNIA REPAIR     Umbilical Hernia   ORBITAL FRACTURE SURGERY Left    ROBOTIC ASSITED PARTIAL NEPHRECTOMY Left Oct 2014    Social History:  reports that he has been smoking cigarettes. He has never used smokeless tobacco. He reports current alcohol use. He reports current drug use. Drug: Marijuana.  Family History:  Family History  Problem Relation Age of Onset   Pancreatic cancer Mother    Colon cancer Father    Diabetes Father    Colon cancer Sister    COPD Sister    Seizures Sister    Ovarian cancer Sister    Cancer Paternal Grandmother        bone   Stroke Paternal Grandfather    Heart disease Paternal Grandfather    Hypertension Neg Hx      Prior to Admission medications   Medication Sig Start Date End Date Taking? Authorizing Provider  amoxicillin-clavulanate (AUGMENTIN) 875-125 MG tablet Take 1 tablet by mouth 2 (two) times daily. 10 days    [provider]  azithromycin (ZITHROMAX) 250 MG tablet Take  500 mg by mouth on day 1. Take 250 mg on days 2 through 5.    [provider]  busPIRone (BUSPAR) 10 MG tablet Take 1 tablet (10 mg total) by mouth 2 (two) times daily. 07/28/23   Lewanda Rife, MD  butalbital-acetaminophen-caffeine (FIORICET) [id number] MG tablet Take 1 tablet by mouth every 6 (six) hours as needed for headache. 07/28/23   Lewanda Rife, MD  hydrOXYzine (ATARAX) 25 MG tablet Take 1 tablet (25 mg total) by mouth 3 (three) times daily as needed for anxiety. 07/28/23   Lewanda Rife, MD  meclizine (ANTIVERT) 12.5 MG tablet Take 1 tablet (12.5 mg total) by mouth 3 (three) times daily as needed for dizziness. 07/28/23   Lewanda Rife, MD  melatonin 5 MG TABS Take 1 tablet (5 mg total) by mouth at bedtime as needed. 07/28/23   Lewanda Rife, MD  mirtazapine (REMERON) 15 MG tablet Take 1 tablet (15 mg total)  by mouth at bedtime. 07/28/23   Lewanda Rife, MD  Multiple Vitamin (MULTIVITAMIN WITH MINERALS) TABS tablet Take 1 tablet by mouth daily. 07/31/21   Maczis, Elmer Sow, PA-C  nicotine (NICODERM CQ - DOSED IN MG/24 HOURS) 14 mg/24hr patch Place 1 patch (14 mg total) onto the skin daily. 07/29/23   Lewanda Rife, MD  sertraline (ZOLOFT) 50 MG tablet Take 1 tablet (50 mg total) by mouth daily. 07/29/23   Lewanda Rife, MD  tamsulosin (FLOMAX) 0.4 MG CAPS capsule Take 1 capsule (0.4 mg total) by mouth daily after supper. 07/28/23   Lewanda Rife, MD    Physical Exam: Vitals:   12/29/23 2025 12/29/23 2030 12/29/23 2100 12/29/23 2330  BP:  97/65 109/60 (!) 158/76  Pulse:  99 99 96  Resp:  16 12 17   Temp: 98 F (36.7 C)     TempSrc: Oral     SpO2:  98% 98% 100%  Weight:       General: Not in acute distress.  Dry mucous membrane HEENT:       Eyes: PERRL, EOMI, no jaundice       ENT: No discharge from the ears and nose, no pharynx injection, no tonsillar enlargement.        Neck: No JVD, no bruit, no mass felt. Heme: No neck lymph node  enlargement. Cardiac: S1/S2, RRR, No murmurs, No gallops or rubs. Respiratory: has decreased air movement bilaterally, with mild wheezing bilaterally GI: Soft, nondistended, nontender, no rebound pain, no organomegaly, BS present. GU: No hematuria Ext: No pitting leg edema bilaterally. 1+DP/PT pulse bilaterally. Musculoskeletal: No joint deformities, No joint redness or warmth, no limitation of ROM in spin. Skin: No rashes.  Neuro: Alert, oriented X3, cranial nerves II-XII grossly intact, moves all extremities normally.  Psych: Patient is not psychotic, no suicidal or hemocidal ideation.  Labs on Admission: I have personally reviewed following labs and imaging studies  CBC: Recent Labs  Lab 12/29/23 2029  WBC 8.6  NEUTROABS 4.3  HGB 15.2  HCT 44.9  MCV 98.5  PLT 186   Basic Metabolic Panel: Recent Labs  Lab 12/29/23 2029  NA 137  K 3.1*  CL 102  CO2 18*  GLUCOSE 223*  BUN 20  CREATININE 1.22  CALCIUM 8.7*   GFR: Estimated Creatinine Clearance: 56.4 mL/min (by C-G formula based on SCr of 1.22 mg/dL). Liver Function Tests: Recent Labs  Lab 12/29/23 2029  AST 27  ALT 24  ALKPHOS 77  BILITOT 0.8  PROT 7.1  ALBUMIN 3.9   No results for input(s): "LIPASE", "AMYLASE" in the last 168 hours. No results for input(s): "AMMONIA" in the last 168 hours. Coagulation Profile: Recent Labs  Lab 12/29/23 2029  INR 1.1   Cardiac Enzymes: No results for input(s): "CKTOTAL", "CKMB", "CKMBINDEX", "TROPONINI" in the last 168 hours. BNP (last 3 results) No results for input(s): "PROBNP" in the last 8760 hours. HbA1C: No results for input(s): "HGBA1C" in the last 72 hours. CBG: No results for input(s): "GLUCAP" in the last 168 hours. Lipid Profile: No results for input(s): "CHOL", "HDL", "LDLCALC", "TRIG", "CHOLHDL", "LDLDIRECT" in the last 72 hours. Thyroid Function Tests: No results for input(s): "TSH", "T4TOTAL", "FREET4", "T3FREE", "THYROIDAB" in the last 72  hours. Anemia Panel: No results for input(s): "VITAMINB12", "FOLATE", "FERRITIN", "TIBC", "IRON", "RETICCTPCT" in the last 72 hours. Urine analysis:    Component Value Date/Time   COLORURINE AMBER (A) 12/16/2023 2313   APPEARANCEUR HAZY (A) 12/16/2023 2313   APPEARANCEUR Clear 06/27/2014  1015   LABSPEC 1.029 12/16/2023 2313   LABSPEC 1.015 06/27/2014 1015   PHURINE 5.0 12/16/2023 2313   GLUCOSEU NEGATIVE 12/16/2023 2313   GLUCOSEU >=500 06/27/2014 1015   HGBUR NEGATIVE 12/16/2023 2313   BILIRUBINUR NEGATIVE 12/16/2023 2313   BILIRUBINUR Negative 06/27/2014 1015   KETONESUR NEGATIVE 12/16/2023 2313   PROTEINUR 30 (A) 12/16/2023 2313   NITRITE NEGATIVE 12/16/2023 2313   LEUKOCYTESUR NEGATIVE 12/16/2023 2313   LEUKOCYTESUR Negative 06/27/2014 1015   Sepsis Labs: @LABRCNTIP (procalcitonin:4,lacticidven:4) )No results found for this or any previous visit (from the past 240 hours).   Radiological Exams on Admission:   Assessment/Plan Principal Problem:   COPD exacerbation (HCC) Active Problems:   Hypotension   Elevated lactic acid level   Pleuritic chest pain   Pulmonary embolus (HCC)   Type 2 diabetes mellitus with obesity (HCC)   Hypokalemia   Fall at home, initial encounter   Lung nodule   Mediastinal adenopathy   Depression with anxiety   Tobacco abuse   Cocaine abuse (HCC)   Polysubstance abuse (HCC)   Alcohol abuse   Assessment and Plan:  COPD exacerbation [facility]): Patient has productive cough, SOB, decreased air movement with mild wheezing on auscultation, indicating COPD exacerbation.  No oxygen desaturation.  Chest x-ray negative.  Patient has pleuritic chest pain, need to rule out PE  -Place in tele bed for obs -Bronchodilators and prn Mucinex -Solu-Medrol 80 mg IV daily after given 125 mg of Solu-Medrol -Oral azithromycin (patient received 1 dose of IV Rocephin and azithromycin in ED) -Incentive spirometry -check PCR for COVID, flu and RSV -Follow up  sputum culture -Follow-up CTA to rule out PE  Hypotension: Patient was initially hypotensive with blood pressure 64/48, which improved to 150/76 after giving 2.25 L LR in ED. possibly due to dehydration.  Patient does not have fever or leukocytosis, clinically not septic. -IV fluid: Total of 2.75 L LR, then 75 cc/h  Elevated lactic acid level: Lactic acid 4.3--> 5.1.  Possibly due to dehydration -IV fluid as above -Trend lactic acid level  Pleuritic chest pain: Troponin 9 --> 10.  Patient has history of PE/DVT, currently not taking anticoagulants.  We needed to rule out PE -As needed Tylenol, Percocet for pain -Lidoderm patch to left chest wall -Follow-up CT to rule out PE  History of pulmonary embolus (HCC) -Follow-up CTA  Type 2 diabetes mellitus with obesity (HCC): Recent A1c 5.6, well-controlled.  Patient is not taking medications currently.  Blood sugar 223 -SSI  Hypokalemia: Potassium 3.1.  Magnesium 1.9, phosphorus of 2.9 -Repleted potassium  Fall at home, initial encounter: -Follow-up CT head -Fall precaution  Lung nodule, mediastinal adenopathy and bilateral axillary adenopathy: CT of chest  showed mediastinal and bilateral axillary adenopathy, increasing since prior study.  -need to f/u with PCP and follow-up CT in 6 months to assess stability.  Lung nodules: CT  showed scattered pulmonary nodules, most of which are stable since prior study and compatible with benign nodules. There is a new left upper lobe nodule measuring 6 mm. -will nee to f/u with PCP and repeat non-contrast chest CT at 3-6 months  Depression with anxiety: Patient is not taking medications currently. -Start as needed hydroxyzine  Polysubstance abuse including tobacco, alcohol, cocaine -Did counseling about importance of quitting substance use -Nicotine patch -CIWA protocol -Check UDS       DVT ppx:  SQ Lovenox  Code Status: Full code   Family Communication:     not done, no family  member is at bed side.     Disposition Plan:  Anticipate discharge back to previous environment  Consults called:  none  Admission status and Level of care: Telemetry Medical:    for obs    Dispo: The patient is from: Home              Anticipated d/c is to: Home              Anticipated d/c date is: 1 day              Patient currently is not medically stable to d/c.    Severity of Illness:  The appropriate patient status for this patient is OBSERVATION. Observation status is judged to be reasonable and necessary in order to provide the required intensity of service to ensure the patient's safety. The patient's presenting symptoms, physical exam findings, and initial radiographic and laboratory data in the context of their medical condition is felt to place them at decreased risk for further clinical deterioration. Furthermore, it is anticipated that the patient will be medically stable for discharge from the hospital within 2 midnights of admission.        Date of Service 12/30/2023    Lorretta Harp [facility]   If 7PM-7AM, please contact night-coverage [email or web address] 12/30/2023, 12:04 AM

## 2023-12-29 NOTE — ED Provider Notes (Signed)
 [facility] Provider Note    Event Date/Time   First MD Initiated Contact with Patient 12/29/23 2029     (approximate)   History   Shortness of Breath   HPI  Randall Candelas. is a 63 y.o. male with a history of COPD, PE not on anticoagulation, alcohol abuse, and anxiety who presents with worsening shortness of breath over the last week, associated with nonproductive cough, as well as a left-sided chest and rib pain.  He denies any vomiting or diarrhea.  He has no fever or chills.  He states that he was recently diagnosed with pneumonia but never picked up the antibiotics.  He also endorses active alcohol use.  I reviewed the past medical records.  The patient was most recently seen in the ED on 3/19 with weakness and lightheadedness in the context of alcohol abuse.  He was admitted to inpatient psychiatry in October of last year with major depressive disorder and suicidal ideation.   Physical Exam   Triage Vital Signs: ED Triage Vitals  Encounter Vitals Group     BP 12/29/23 2012 (!) 64/48     Systolic BP Percentile --      Diastolic BP Percentile --      Pulse Rate 12/29/23 2012 (!) 108     Resp 12/29/23 2012 18     Temp 12/29/23 2025 98 F (36.7 C)     Temp Source 12/29/23 2025 Oral     SpO2 12/29/23 2003 96 %     Weight 12/29/23 2025 160 lb (72.6 kg)     Height --      Head Circumference --      Peak Flow --      Pain Score 12/29/23 2009 10     Pain Loc --      Pain Education --      Exclude from Growth Chart --     Most recent vital signs: Vitals:   12/29/23 2100 12/29/23 2330  BP: 109/60 (!) 158/76  Pulse: 99 96  Resp: 12 17  Temp:    SpO2: 98% 100%     General: Alert and oriented, uncomfortable appearing, no distress.  CV:  Good peripheral perfusion.  Resp:  Normal effort.  Diminished breath sounds bilaterally.  No wheezing or rales. Abd:  No distention.  Soft and nontender. Other:  Intoxicated appearing.  Dry mucous membranes.   Left chest wall tenderness with no ecchymosis or skin findings.  No step-off or crepitus.  No peripheral edema.   ED Results / Procedures / Treatments   Labs (all labs ordered are listed, but only abnormal results are displayed) Labs Reviewed  COMPREHENSIVE METABOLIC PANEL WITH GFR - Abnormal; Notable for the following components:      Result Value   Potassium 3.1 (*)    CO2 18 (*)    Glucose, Bld 223 (*)    Calcium 8.7 (*)    Anion gap 17 (*)    All other components within normal limits  LACTIC ACID, PLASMA - Abnormal; Notable for the following components:   Lactic Acid, Venous 4.3 (*)    All other components within normal limits  LACTIC ACID, PLASMA - Abnormal; Notable for the following components:   Lactic Acid, Venous 5.1 (*)    All other components within normal limits  ETHANOL - Abnormal; Notable for the following components:   Alcohol, Ethyl (B) 146 (*)    All other components within normal limits  CULTURE, BLOOD (  ROUTINE X 2)  CULTURE, BLOOD (ROUTINE X 2)  RESP PANEL BY RT-PCR (RSV, FLU A&B, COVID)  RVPGX2  EXPECTORATED SPUTUM ASSESSMENT W GRAM STAIN, RFLX TO RESP C  CBC WITH DIFFERENTIAL/PLATELET  PROTIME-INR  URINALYSIS, W/ REFLEX TO CULTURE (INFECTION SUSPECTED)  URINE DRUG SCREEN, QUALITATIVE ([facility] ONLY)  HIV ANTIBODY (ROUTINE TESTING W REFLEX)  BASIC METABOLIC PANEL WITH GFR  CBC  MAGNESIUM  PHOSPHORUS  TROPONIN I (HIGH SENSITIVITY)  TROPONIN I (HIGH SENSITIVITY)     EKG  ED ECG REPORT I, Dionne Bucy, the attending physician, personally viewed and interpreted this ECG.  Date: 12/29/2023 EKG Time: 2010 Rate: 108 Rhythm: Sinus tachycardia QRS Axis: normal Intervals: RBBB, LAFB ST/T Wave abnormalities: normal Narrative Interpretation: no evidence of acute ischemia    RADIOLOGY  Chest x-ray: I independently viewed and interpreted the images; there is no focal consolidation or edema  CT chest: IMPRESSION:  Mild airway thickening may  reflect bronchitis.    Coronary artery disease.    Mediastinal and bilateral axillary adenopathy, increasing since  prior study. Recommend follow-up CT in 6 months to assess stability.    Scattered pulmonary nodules, most of which are stable since prior  study and compatible with benign nodules. There is a new left upper  lobe nodule measuring 6 mm. Non-contrast chest CT at 3-6 months is  recommended. If the nodules are stable at time of repeat CT, then  future CT at 18-24 months (from today's scan) is considered optional  for low-risk patients, but is recommended for high-risk patients.  This recommendation follows the consensus statement: Guidelines for  Management of Incidental Pulmonary Nodules Detected on CT Images:  From the Fleischner Society 2017; Radiology 2017; 284:228-243.    Aortic Atherosclerosis (ICD10-I70.0).     PROCEDURES:  Critical Care performed: Yes, see critical care procedure note(s)  .Critical Care  Performed by: Dionne Bucy, MD Authorized by: Dionne Bucy, MD   Critical care provider statement:    Critical care time (minutes):  30   Critical care time was exclusive of:  Separately billable procedures and treating other patients   Critical care was necessary to treat or prevent imminent or life-threatening deterioration of the following conditions:  Circulatory failure and sepsis   Critical care was time spent personally by me on the following activities:  Development of treatment plan with patient or surrogate, discussions with consultants, evaluation of patient's response to treatment, examination of patient, ordering and review of laboratory studies, ordering and review of radiographic studies, ordering and performing treatments and interventions, pulse oximetry, re-evaluation of patient's condition, review of old charts and obtaining history from patient or surrogate   Care discussed with: admitting provider      MEDICATIONS ORDERED IN  ED: Medications  0.9 %  sodium chloride infusion (has no administration in time range)  ipratropium-albuterol (DUONEB) 0.5-2.5 (3) MG/3ML nebulizer solution 3 mL (has no administration in time range)  albuterol (VENTOLIN HFA) 108 (90 Base) MCG/ACT inhaler 2 puff (has no administration in time range)  dextromethorphan-guaiFENesin (MUCINEX DM) 30-600 MG per 12 hr tablet 1 tablet (has no administration in time range)  ondansetron (ZOFRAN) injection 4 mg (has no administration in time range)  acetaminophen (TYLENOL) tablet 650 mg (has no administration in time range)  hydrALAZINE (APRESOLINE) injection 5 mg (has no administration in time range)  insulin aspart (novoLOG) injection 0-9 Units (has no administration in time range)  insulin aspart (novoLOG) injection 0-5 Units (has no administration in time range)  potassium chloride SA (  KLOR-CON M) CR tablet 40 mEq (has no administration in time range)  nicotine (NICODERM CQ - dosed in mg/24 hours) patch 21 mg (has no administration in time range)  LORazepam (ATIVAN) tablet 1-4 mg (has no administration in time range)    Or  LORazepam (ATIVAN) injection 1-4 mg (has no administration in time range)  thiamine (VITAMIN B1) tablet 100 mg (has no administration in time range)    Or  thiamine (VITAMIN B1) injection 100 mg (has no administration in time range)  folic acid (FOLVITE) tablet 1 mg (has no administration in time range)  multivitamin with minerals tablet 1 tablet (has no administration in time range)  LORazepam (ATIVAN) injection 0-4 mg (has no administration in time range)    Followed by  LORazepam (ATIVAN) injection 0-4 mg (has no administration in time range)  enoxaparin (LOVENOX) injection 40 mg (has no administration in time range)  lactated ringers bolus 1,000 mL (0 mLs Intravenous Stopped 12/29/23 2131)    And  lactated ringers bolus 1,000 mL (0 mLs Intravenous Stopped 12/29/23 2131)    And  lactated ringers bolus 250 mL (0 mLs  Intravenous Stopped 12/29/23 2217)  cefTRIAXone (ROCEPHIN) 2 g in sodium chloride 0.9 % 100 mL IVPB (0 g Intravenous Stopped 12/29/23 2108)  azithromycin (ZITHROMAX) 500 mg in sodium chloride 0.9 % 250 mL IVPB (0 mg Intravenous Stopped 12/29/23 2208)  fentaNYL (SUBLIMAZE) injection 50 mcg (50 mcg Intravenous Given 12/29/23 2045)  albuterol (PROVENTIL) (2.5 MG/3ML) 0.083% nebulizer solution 2.5 mg (2.5 mg Nebulization Given 12/29/23 2210)  albuterol (PROVENTIL) (2.5 MG/3ML) 0.083% nebulizer solution 2.5 mg (2.5 mg Nebulization Given 12/29/23 2210)  methylPREDNISolone sodium succinate (SOLU-MEDROL) 125 mg/2 mL injection 125 mg (125 mg Intravenous Given 12/29/23 2210)     IMPRESSION / MDM / ASSESSMENT AND PLAN / ED COURSE  I reviewed the triage vital signs and the nursing notes.  63 year old male with PMH as noted above presents with worsening shortness of breath, cough, and left-sided chest wall pain for the last week.  On exam he is initially quite hypotensive with borderline tachycardia.  Other vital signs are normal.  O2 saturation is in the high 90s on room air.  He has some left chest wall tenderness.  Abdomen is nontender.  There is no edema.  Breath sounds are diminished bilaterally.  Differential diagnosis includes, but is not limited to, pneumonia, acute bronchitis, COVID, influenza, other viral syndrome, musculoskeletal chest pain, rib fracture, ACS, pneumothorax.  I have a low suspicion for PE given the lack of hypoxia and the reproducible nature of the pain.  Will give fluids and empiric antibiotics per the sepsis protocol, obtain chest x-ray, lab workup, and reassess.  Patient's presentation is most consistent with acute presentation with potential threat to life or bodily function.  The patient is on the cardiac monitor to evaluate for evidence of arrhythmia and/or significant heart rate changes.   ----------------------------------------- 11:32 PM on  12/29/2023 -----------------------------------------  Chest x-ray showed no acute findings so I proceeded with a CT chest.  This shows bronchitis but no evidence of pneumonia or other findings to explain the patient's possible sepsis.  Lab workup is significant for elevated lactic acid.  Ethanol level is also elevated consistent with acute intoxication.  Troponin is negative.  CBC shows no leukocytosis.  The patient's vital signs have markedly improved after fluids and he is doing better clinically after fentanyl for pain as well as bronchodilators and steroid.  However, he will need admission for further  management.  I consulted Dr. Clyde Lundborg from the hospitalist service; based on our discussion he agrees to evaluate the patient for admission.   FINAL CLINICAL IMPRESSION(S) / ED DIAGNOSES   Final diagnoses:  Acute bronchitis, unspecified organism  Alcohol abuse     Rx / DC Orders   ED Discharge Orders     None        Note:  This document was prepared using Dragon voice recognition software and may include unintentional dictation errors.    Dionne Bucy, MD 12/30/23 0001

## 2023-12-29 NOTE — ED Triage Notes (Addendum)
 Pt to ED via [facility] c/o [facility]OB x1week. Was seen here on the 19th and was dx with pneumonia. Pt has not picked his meds. Pt also endorsing left rib pain. Denies CP, dizziness, fevers

## 2023-12-29 NOTE — ED Triage Notes (Signed)
 EMS brings pt in from home; recent dx pneumonia but did not get antibiotics; c/o left lateral rib pain and rt shoulder pain

## 2023-12-30 ENCOUNTER — Observation Stay

## 2023-12-30 DIAGNOSIS — R0781 Pleurodynia: Secondary | ICD-10-CM

## 2023-12-30 DIAGNOSIS — W19XXXA Unspecified fall, initial encounter: Secondary | ICD-10-CM

## 2023-12-30 DIAGNOSIS — R7989 Other specified abnormal findings of blood chemistry: Secondary | ICD-10-CM | POA: Diagnosis not present

## 2023-12-30 DIAGNOSIS — Y92009 Unspecified place in unspecified non-institutional (private) residence as the place of occurrence of the external cause: Secondary | ICD-10-CM

## 2023-12-30 DIAGNOSIS — I2782 Chronic pulmonary embolism: Secondary | ICD-10-CM | POA: Diagnosis not present

## 2023-12-30 DIAGNOSIS — I959 Hypotension, unspecified: Secondary | ICD-10-CM | POA: Diagnosis not present

## 2023-12-30 DIAGNOSIS — J441 Chronic obstructive pulmonary disease with (acute) exacerbation: Secondary | ICD-10-CM | POA: Diagnosis not present

## 2023-12-30 LAB — HIV ANTIBODY (ROUTINE TESTING W REFLEX): HIV Screen 4th Generation wRfx: NONREACTIVE

## 2023-12-30 LAB — BASIC METABOLIC PANEL WITH GFR
Anion gap: 10 (ref 5–15)
BUN: 16 mg/dL (ref 8–23)
CO2: 23 mmol/L (ref 22–32)
Calcium: 8 mg/dL — ABNORMAL LOW (ref 8.9–10.3)
Chloride: 102 mmol/L (ref 98–111)
Creatinine, Ser: 0.99 mg/dL (ref 0.61–1.24)
GFR, Estimated: >60 mL/min (ref >60)
Glucose, Bld: 233 mg/dL — ABNORMAL HIGH (ref 70–99)
Potassium: 4 mmol/L (ref 3.5–5.1)
Sodium: 135 mmol/L (ref 135–145)

## 2023-12-30 LAB — URINALYSIS, W/ REFLEX TO CULTURE (INFECTION SUSPECTED)
Bacteria, UA: NONE SEEN
Bilirubin Urine: NEGATIVE
Glucose, UA: >=500 mg/dL — AB
Hgb urine dipstick: NEGATIVE
Ketones, ur: NEGATIVE mg/dL
Leukocytes,Ua: NEGATIVE
Nitrite: NEGATIVE
Protein, ur: NEGATIVE mg/dL
Specific Gravity, Urine: 1.016 (ref 1.005–1.030)
pH: 5 (ref 5.0–8.0)

## 2023-12-30 LAB — RESP PANEL BY RT-PCR (RSV, FLU A&B, COVID)  RVPGX2
Influenza A by PCR: NEGATIVE
Influenza B by PCR: NEGATIVE
Resp Syncytial Virus by PCR: NEGATIVE
SARS Coronavirus 2 by RT PCR: NEGATIVE

## 2023-12-30 LAB — CBC
HCT: 38.3 % — ABNORMAL LOW (ref 39.0–52.0)
Hemoglobin: 12.9 g/dL — ABNORMAL LOW (ref 13.0–17.0)
MCH: 33.5 pg (ref 26.0–34.0)
MCHC: 33.7 g/dL (ref 30.0–36.0)
MCV: 99.5 fL (ref 80.0–100.0)
Platelets: 129 10*3/uL — ABNORMAL LOW (ref 150–400)
RBC: 3.85 MIL/uL — ABNORMAL LOW (ref 4.22–5.81)
RDW: 12.3 % (ref 11.5–15.5)
WBC: 5.4 10*3/uL (ref 4.0–10.5)
nRBC: 0 % (ref 0.0–0.2)

## 2023-12-30 LAB — URINE DRUG SCREEN, QUALITATIVE (ARMC ONLY)
Amphetamines, Ur Screen: NOT DETECTED
Barbiturates, Ur Screen: NOT DETECTED
Benzodiazepine, Ur Scrn: NOT DETECTED
Cannabinoid 50 Ng, Ur ~~LOC~~: NOT DETECTED
Cocaine Metabolite,Ur ~~LOC~~: NOT DETECTED
MDMA (Ecstasy)Ur Screen: NOT DETECTED
Methadone Scn, Ur: NOT DETECTED
Opiate, Ur Screen: NOT DETECTED
Phencyclidine (PCP) Ur S: NOT DETECTED
Tricyclic, Ur Screen: NOT DETECTED

## 2023-12-30 LAB — LACTIC ACID, PLASMA
Lactic Acid, Venous: 3.6 mmol/L (ref 0.5–1.9)
Lactic Acid, Venous: 2.8 mmol/L (ref 0.5–1.9)
Lactic Acid, Venous: 3.5 mmol/L (ref 0.5–1.9)
Lactic Acid, Venous: 2.8 mmol/L (ref 0.5–1.9)

## 2023-12-30 LAB — EXPECTORATED SPUTUM ASSESSMENT W GRAM STAIN, RFLX TO RESP C

## 2023-12-30 LAB — CBG MONITORING, ED
Glucose-Capillary: 200 mg/dL — ABNORMAL HIGH (ref 70–99)
Glucose-Capillary: 225 mg/dL — ABNORMAL HIGH (ref 70–99)
Glucose-Capillary: 228 mg/dL — ABNORMAL HIGH (ref 70–99)
Glucose-Capillary: 257 mg/dL — ABNORMAL HIGH (ref 70–99)
Glucose-Capillary: 216 mg/dL — ABNORMAL HIGH (ref 70–99)

## 2023-12-30 LAB — MAGNESIUM: Magnesium: 1.9 mg/dL (ref 1.7–2.4)

## 2023-12-30 LAB — PROCALCITONIN: Procalcitonin: <0.1 ng/mL

## 2023-12-30 LAB — PHOSPHORUS: Phosphorus: 2.9 mg/dL (ref 2.5–4.6)

## 2023-12-30 MED ORDER — LIDOCAINE 5 % EX PTCH
1.0000 | MEDICATED_PATCH | Freq: Every day | CUTANEOUS | Status: DC
  Administered 2023-12-30 (×2): 1 via TRANSDERMAL
  Filled 2023-12-30 (×2): qty 1

## 2023-12-30 MED ORDER — METHYLPREDNISOLONE SODIUM SUCC 125 MG IJ SOLR
80.0000 mg | INTRAMUSCULAR | Status: DC
  Administered 2023-12-30 – 2023-12-31 (×2): 80 mg via INTRAVENOUS
  Filled 2023-12-30 (×2): qty 2

## 2023-12-30 MED ORDER — OXYCODONE-ACETAMINOPHEN 5-325 MG PO TABS
1.0000 | ORAL_TABLET | ORAL | Status: DC | PRN
  Administered 2023-12-30 – 2023-12-31 (×3): 1 via ORAL
  Filled 2023-12-30 (×3): qty 1

## 2023-12-30 MED ORDER — AZITHROMYCIN 500 MG PO TABS
250.0000 mg | ORAL_TABLET | Freq: Every day | ORAL | Status: DC
  Administered 2023-12-30: 250 mg via ORAL
  Filled 2023-12-30: qty 1

## 2023-12-30 MED ORDER — IOHEXOL 350 MG/ML SOLN
75.0000 mL | Freq: Once | INTRAVENOUS | Status: AC | PRN
  Administered 2023-12-30: 75 mL via INTRAVENOUS

## 2023-12-30 MED ORDER — ALBUTEROL SULFATE (2.5 MG/3ML) 0.083% IN NEBU: 2.5000 mg | INHALATION_SOLUTION | RESPIRATORY_TRACT | Status: DC | PRN

## 2023-12-30 MED ORDER — LACTATED RINGERS IV BOLUS
500.0000 mL | Freq: Once | INTRAVENOUS | Status: AC
  Administered 2023-12-30: 500 mL via INTRAVENOUS

## 2023-12-30 MED ORDER — HYDROXYZINE HCL 25 MG PO TABS
25.0000 mg | ORAL_TABLET | Freq: Three times a day (TID) | ORAL | Status: DC | PRN
  Administered 2023-12-31: 25 mg via ORAL
  Filled 2023-12-30: qty 1

## 2023-12-30 NOTE — Discharge Instructions (Addendum)
 Intensive Outpatient Programs   [facility] [address] [place],  [place], [place] [phone]  [facility] (Inpatient and outpatient)[phone] (Suboxone and Methadone) [address] [phone]  [facility]: [facility] - Intensive Outpatient [address] [place], [place] [address], [place]  [phone]  [facility] (Outpatient, Inpatient, [facility] (Groups and Residental) (insurance only) [phone] [place], [place] [phone]   [facility] (for caregivers and family) [address] Laurell Josephs [address] [place], [place] [phone]  Residential Treatment Programs  [facility](2 years) Residential: 30 days)[facility] ([facility] [address] [facility] [address] [place], [place], [place] [phone] or [phone]  [facility] [address] [place], [place], [place] [phone]  [facility] ([facility]) [address] [place], [place], [place] [phone] Admissions: 8am-3pm M-F  [facility] Program: Residential Program [phone] Days)             [facility]: [facility]  [place], [place], [place]  [phone] or [phone] in Hours over the weekend or by referral)  [facility] [address] [place], [place] [address] [phone] (Do virtual or phone assessment, offer transportation within 25 miles, have in patient and Outpatient options)   Mobil Crisis: [facility]:[phone] (for crisis  response 24 hours a day)   Psychologist, counselling Name: [facility] Agency Address: [address] [place], [place] [address] Phone: [phone] Email: [email or web address] Website: [email or web address] Service(s) Offered: Housing services, self-sufficiency, congregate meal program, weatherization program, Field seismologist program, emergency food assistance,  housing counseling, home ownership program, wheels-towork program.  Agency Name: [facility] [employer] [phone]) Address: [address], [place], [place] [address] Phone: [phone] Website: [email or web address] Service(s) Offered: Transportation for [employer], subscription and demand response; Dial-a-Ride for citizens 85 years of age or older.  Agency Name: Department of Social Services Address: [address] [place], [place] [address] Phone: [phone] Service(s) Offered: Child support services; child welfare services; food stamps; Medicaid; work first family assistance; and aid with fuel,  rent, food and medicine, transportation assistance.  Agency Name: Disabled [employer] (DAV) Transportation  Network Phone: [phone] Service(s) Offered: Transports veterans to the [facility] medical center. Call  forty-eight hours in advance and leave the name, telephone  number, date, and time of appointment. Veteran will be  contacted by the driver the day before the appointment to  arrange a pick up point   Transportation Resources  Agency Name: [facility] Agency Address: [address] [place], [place] [address] Phone: [phone] Email: [email or web address] Website: [email or web address] Service(s) Offered: Housing services, self-sufficiency, congregate meal program, weatherization program, Field seismologist program, emergency food assistance,  housing counseling, home ownership  program, wheels-towork program.  Agency Name: [facility] [employer] [phone]) Address: [address] [place], [place] [address] Phone: [phone] Website: [email or web address] Service(s) Offered: Transportation for [employer], subscription and demand response; Dial-a-Ride for citizens 32 years of age or older.  Agency Name: Department of Social Services Address: [address] [place], [place] [address] Phone: [phone] Service(s) Offered: Child support services; child welfare services; food stamps; Medicaid; work first family assistance; and aid with fuel,  rent, food and medicine, transportation assistance.  Agency Name: Disabled [address] (DAV) [employer]  Phone: [phone] Service(s) Offered: Transports veterans to the [facility] medical center. Call  forty-eight hours in advance and leave the name, telephone  number, date, and time of appointment. Veteran will be  contacted by the driver the day before the appointment to  arrange a pick up point    [employer] ACTA currently provides door to door services. ACTA connects with PART daily for services to [facility]. ACTA also performs contract services to [employer] operates 27 vehicles, all but 3 mini-vans are equipped with lifts for special needs as well as the general public. ACTA drivers are each CDL certified and trained in First Aid and CPR. ACTA was established in 2002 by [employer]. An independent Industrial/product designer. ACTA operates via Cytogeneticist with required Research scientist (physical sciences) from [place]. ACTA provides over 80,000 passenger trips each year, including [facility] and [employer] sites.  Call at least by 11 AM one business day prior to needing transportation  [employer].                      [place], [place] [address]      Office Hours: Monday-Friday  8 AM - 5 PM  Agency Name: [facility] Agency Address: [address] [place], [place] [address] Phone: [phone] Website: [email or web address] Service(s) Offered: Housing services, self-sufficiency, congregate meal program, and individual development account program.  Agency Name: [employer] [facility] [place] Address: [address], [place], [place] [address] Phone: [phone] Email: [email or web address] Website: [email or web address] Service(s) Offered: Housing the homeless, feeding the hungry, Company secretary, job and education related services.  Agency Name: [facility] Address: [address] [place], [place] [address] Phone: [phone] Email: [email or web address] Service(s) Offered: Counseling, problem pregnancy, advocacy for Hispanics, limited emergency financial assistance.  Agency Name: Department of Social Services Address: [address] [place], [place] [address] Phone: [phone] Website: [email or web address] Service(s) Offered: Child support services; child welfare services; SNAP; Medicaid; work first family assistance; and aid with fuel,  rent, food and medicine.  Agency Name: Holiday representative Address: [address], [place], [place] [address] Phone: [phone] or [phone] Email: [email or web address] Service(s) Offered: Family services and transient assistance; emergency food, fuel, clothing, limited furniture, utilities; budget counseling, general counseling; give a kid a coat; thrift store; Christmas food and toys. Utility assistance, food pantry, rental  assistance, life sustaining medicine

## 2023-12-30 NOTE — TOC Transition Note (Signed)
 Transition of Care [facility]) - Discharge Note   Patient Details  Name: Mustapha Colson. MRN: [id number] Date of Birth: October 12, 1960  [facility] CM/SW Contact:  Elberta Fortis, RN Phone Number: 12/30/2023, 2:53 PM   Clinical Narrative:    Resources added to AVS for substance abuse, food, transportation, and utilities.      Barriers to Discharge: Continued Medical Work up   Patient Goals and CMS Choice            Discharge Placement                       Discharge Plan and Services Additional resources added to the After Visit Summary for     Discharge Planning Services: CM Consult                                 Social Drivers of Health (SDOH) Interventions SDOH Screenings   Food Insecurity: Food Insecurity Present (12/30/2023)  Housing: Unknown (12/30/2023)  Transportation Needs: Unmet Transportation Needs (12/30/2023)  Utilities: At Risk (07/15/2023)  Alcohol Screen: Low Risk  (09/27/2018)  Depression (PHQ2-9): High Risk (09/24/2021)  Tobacco Use: High Risk (12/29/2023)     Readmission Risk Interventions     No data to display

## 2023-12-30 NOTE — Progress Notes (Signed)
 PROGRESS NOTE    Randall Harvey.  WGN:[id number] DOB: [birth year] DOA: 12/29/2023 PCP: Margarita Mail, DO  Chief Complaint  Patient presents with   Shortness of Breath    Hospital Course:  Randall Venus. is 63 y.o. male with polysubstance abuse (tobacco, alcohol, cocaine), retention, diabetes, COPD, prior CVA, depression, anxiety, BPH, subdural hematoma, renal cell carcinoma, PE/DVT not on anticoagulation, medication nonadherence, who presents with shortness of breath and chest pain which has been progressive over the last several weeks but acutely worsening. Patient was seen in the ED on 3/19 and diagnosed with pneumonia.  He was given Augmentin and azithromycin but did not pick up the antibiotics.  Patient also endorses recent falls, he is not sure if he hit his head but had no loss of consciousness. On arrival to the ED his blood pressure was initially 64/48 which improved with 2 L fluid resuscitation.  He was also found to have a lactic acidosis, alcohol level 146, tachycardic to 108.  Chest x-ray was negative for acute finding.  Chest CT revealed possible bronchitis, CAD, mediastinal and bilateral axillary adenopathy and scattered pulmonary nodules.  Patient was admitted for COPD exacerbation.  Subjective: On evaluation today patient reports he is feeling much better.  He is endorsing concerns with his right shoulder.  Reports that he dislocated his right shoulder 2 weeks ago and since then has had increasing immobility.  He reports he was supposed to follow-up with orthopedic surgery but does not currently have a car so has not seen anyone.   Objective: Vitals:   12/30/23 0500 12/30/23 0612 12/30/23 0900 12/30/23 0920  BP: (!) 157/86 (!) 148/83 (!) 153/82   Pulse: 82 81 73   Resp: 16  15   Temp:    97.9 F (36.6 C)  TempSrc:    Oral  SpO2: 100%  97%   Weight:        Intake/Output Summary (Last 24 hours) at 12/30/2023 0948 Last data filed at 12/30/2023 0239 Gross per 24 hour   Intake 2981.57 ml  Output --  Net 2981.57 ml   Filed Weights   12/29/23 2025  Weight: 72.6 kg    Examination: General exam: Appears calm and comfortable, NAD  Respiratory system: No work of breathing, symmetric chest wall expansion, no wheeze appreciated Cardiovascular system: S1 & S2 heard, RRR.  Gastrointestinal system: Abdomen is nondistended, soft and nontender.  Neuro: Alert and oriented. No focal neurological deficits. Extremities: Symmetric, patient is hesitant to move right shoulder, no obvious shoulder deformity or bruising. Skin: No rashes, lesions Psychiatry: Demonstrates appropriate judgement and insight. Mood & affect appropriate for situation.   Assessment & Plan:  Principal Problem:   COPD exacerbation (HCC) Active Problems:   Hypotension   Elevated lactic acid level   Pleuritic chest pain   Pulmonary embolus (HCC)   Type 2 diabetes mellitus with obesity (HCC)   Hypokalemia   Fall at home, initial encounter   Lung nodule   Mediastinal adenopathy   Depression with anxiety   Tobacco abuse   Cocaine abuse (HCC)   Polysubstance abuse (HCC)   Alcohol abuse    COPD exacerbation - Productive cough, dyspnea, wheezing. - Remains on room air, supplemental oxygen as needed - Chest x-ray negative for concurrent pneumonia - Chest CT negative for pulmonary embolism - Continue bronchodilators and Mucinex - Continue IV steroids, wean as tolerated - Patient received Rocephin and azithromycin in the ED, procalcitonin unremarkable, will discontinue further antibiotics - COVID/flu/RSV negative -  Sputum culture pending  Hypotension - Initially hypotensive to 64/48 which solved after 2.25 L fluid resuscitation - Sepsis ruled out, no fever or leukocytosis.  Hypotension may have been secondary to dehydration  Elevated lactic acid - Lactic 4.3 -> 5.1 --> 3.5 -> 2.8 - It is improving but has not resolved to normal, continue to monitor.  No evidence of intra-abdominal  process.  Not clinically septic - No obvious etiology.  No offending medications.  Kidney and liver function appear normal.  Does not appear to be hypoxic.  There is some concern for malignancy given pulmonary nodules and adenopathy.  Will continue to trend.  Pleuritic chest pain - Troponin 9->10 - History of PE/DVT, chest CT negative - Continue as needed Tylenol/Percocet for the pain - Suspect this is musculoskeletal secondary to costochondritis from COPD exacerbation as above  History of pulmonary embolism - History of DVT - Not on anticoagulation outpatient.  CTA negative on arrival  Type 2 diabetes with obesity - Hemoglobin A1c 5.6%, well-controlled - Continue trending blood glucose on CMP, anticipate they will be elevated above baseline given prednisone - Sliding scale insulin as needed  Hypokalemia - Replace as needed  Fall at home, initial encounter - Head CT negative - Fall precautions - PT/OT  Lung nodules Mediastinal adenopathy Bilateral axillary adenopathy - As seen on chest CT, increasing since prior study - Needs outpatient follow-up with PCP and follow-up CT scan in 3 to 6 months to reassess  Depression with anxiety - Not on medications outpatient - As needed hydroxyzine  Tobacco abuse - Counseled on cessation - Nicotine patch  Alcohol abuse - EtOH 147 on arrival - CIWA protocol - Last drink: 4/1.  Patient endorses he is not an everyday drinker. - Is on thiamine and folic acid supplementation, will continue.  Cocaine use - Have educated on abstinence.  Right shoulder pain - History of dislocation - Film shows no acute fracture.  Needs close outpatient follow-up with orthopedic surgery.  Likely needs MRI   DVT prophylaxis: lovenox   Code Status: Full Code Family Communication:  Discussed directly with patient Disposition: Inpatient, still hospitalized for workup.  Persistent lactic acidosis.  Consultants:    Procedures:    Antimicrobials:   Anti-infectives (From admission, onward)    Start     Dose/Rate Route Frequency Ordered Stop   12/30/23 1000  azithromycin (ZITHROMAX) tablet 250 mg        250 mg Oral Daily 12/30/23 0005     12/29/23 2045  cefTRIAXone (ROCEPHIN) 2 g in sodium chloride 0.9 % 100 mL IVPB        2 g 200 mL/hr over 30 Minutes Intravenous Once 12/29/23 2036 12/29/23 2108   12/29/23 2045  azithromycin (ZITHROMAX) 500 mg in sodium chloride 0.9 % 250 mL IVPB        500 mg 250 mL/hr over 60 Minutes Intravenous  Once 12/29/23 2036 12/29/23 2208       Data Reviewed: I have personally reviewed following labs and imaging studies CBC: Recent Labs  Lab 12/29/23 2029 12/30/23 0519  WBC 8.6 5.4  NEUTROABS 4.3  --   HGB 15.2 12.9*  HCT 44.9 38.3*  MCV 98.5 99.5  PLT 186 129*   Basic Metabolic Panel: Recent Labs  Lab 12/29/23 2029 12/29/23 2315 12/30/23 0519  NA 137  --  135  K 3.1*  --  4.0  CL 102  --  102  CO2 18*  --  23  GLUCOSE 223*  --  233*  BUN 20  --  16  CREATININE 1.22  --  0.99  CALCIUM 8.7*  --  8.0*  MG  --  1.9  --   PHOS  --  2.9  --    GFR: Estimated Creatinine Clearance: 69.5 mL/min (by C-G formula based on SCr of 0.99 mg/dL). Liver Function Tests: Recent Labs  Lab 12/29/23 2029  AST 27  ALT 24  ALKPHOS 77  BILITOT 0.8  PROT 7.1  ALBUMIN 3.9   CBG: Recent Labs  Lab 12/30/23 0133 12/30/23 0755  GLUCAP 200* 225*    Recent Results (from the past 240 hours)  Culture, blood (Routine x 2)     Status: None (Preliminary result)   Collection Time: 12/29/23  8:28 PM   Specimen: BLOOD  Result Value Ref Range Status   Specimen Description BLOOD BLOOD RIGHT FOREARM  Final   Special Requests   Final    BOTTLES DRAWN AEROBIC AND ANAEROBIC Blood Culture results may not be optimal due to an inadequate volume of blood received in culture bottles   Culture   Final    NO GROWTH < 12 HOURS Performed at [facility], [address] [place], [place] [address]     Report Status PENDING  Incomplete  Culture, blood (Routine x 2)     Status: None (Preliminary result)   Collection Time: 12/29/23  8:28 PM   Specimen: BLOOD  Result Value Ref Range Status   Specimen Description BLOOD BLOOD LEFT FOREARM  Final   Special Requests   Final    BOTTLES DRAWN AEROBIC AND ANAEROBIC Blood Culture adequate volume   Culture   Final    NO GROWTH < 12 HOURS Performed at [facility], [address] [place], [place] [address]    Report Status PENDING  Incomplete  Resp panel by RT-PCR (RSV, Flu A&B, Covid) Anterior Nasal Swab     Status: None   Collection Time: 12/29/23 10:15 PM   Specimen: Anterior Nasal Swab  Result Value Ref Range Status   SARS Coronavirus 2 by RT PCR NEGATIVE NEGATIVE Final    Comment: (NOTE) SARS-CoV-2 target nucleic acids are NOT DETECTED.  The SARS-CoV-2 RNA is generally detectable in upper respiratory specimens during the acute phase of infection. The lowest concentration of SARS-CoV-2 viral copies this assay can detect is 138 copies/mL. A negative result does not preclude SARS-Cov-2 infection and should not be used as the sole basis for treatment or other patient management decisions. A negative result may occur with  improper specimen collection/handling, submission of specimen other than nasopharyngeal swab, presence of viral mutation(s) within the areas targeted by this assay, and inadequate number of viral copies(<138 copies/mL). A negative result must be combined with clinical observations, patient history, and epidemiological information. The expected result is Negative.  Fact Sheet for Patients:  BloggerCourse.com  Fact Sheet for Healthcare Providers:  SeriousBroker.it  This test is no t yet approved or cleared by the [place] FDA and  has been authorized for detection and/or diagnosis of SARS-CoV-2 by FDA under an Emergency Use Authorization (EUA). This EUA  will remain  in effect (meaning this test can be used) for the duration of the COVID-19 declaration under Section 564(b)(1) of the Act, 21 U.S.C.section 360bbb-3(b)(1), unless the authorization is terminated  or revoked sooner.       Influenza A by PCR NEGATIVE NEGATIVE Final   Influenza B by PCR NEGATIVE NEGATIVE Final    Comment: (NOTE) The Xpert  Xpress SARS-CoV-2/FLU/RSV plus assay is intended as an aid in the diagnosis of influenza from Nasopharyngeal swab specimens and should not be used as a sole basis for treatment. Nasal washings and aspirates are unacceptable for Xpert Xpress SARS-CoV-2/FLU/RSV testing.  Fact Sheet for Patients: BloggerCourse.com  Fact Sheet for Healthcare Providers: SeriousBroker.it  This test is not yet approved or cleared by the [place] FDA and has been authorized for detection and/or diagnosis of SARS-CoV-2 by FDA under an Emergency Use Authorization (EUA). This EUA will remain in effect (meaning this test can be used) for the duration of the COVID-19 declaration under Section 564(b)(1) of the Act, 21 U.S.C. section 360bbb-3(b)(1), unless the authorization is terminated or revoked.     Resp Syncytial Virus by PCR NEGATIVE NEGATIVE Final    Comment: (NOTE) Fact Sheet for Patients: BloggerCourse.com  Fact Sheet for Healthcare Providers: SeriousBroker.it  This test is not yet approved or cleared by the [place] FDA and has been authorized for detection and/or diagnosis of SARS-CoV-2 by FDA under an Emergency Use Authorization (EUA). This EUA will remain in effect (meaning this test can be used) for the duration of the COVID-19 declaration under Section 564(b)(1) of the Act, 21 U.S.C. section 360bbb-3(b)(1), unless the authorization is terminated or revoked.  Performed at [facility], [address] [place], [place]  [address]   Expectorated Sputum Assessment w Gram Stain, Rflx to Resp Cult     Status: None   Collection Time: 12/30/23  5:19 AM   Specimen: Expectorated Sputum  Result Value Ref Range Status   Specimen Description EXPECTORATED SPUTUM  Final   Special Requests NONE  Final   Sputum evaluation   Final    THIS SPECIMEN IS ACCEPTABLE FOR SPUTUM CULTURE Performed at [facility], [address] [place], [place] [address]    Report Status 12/30/2023 FINAL  Final     Radiology Studies: CT Angio Chest Pulmonary Embolism (PE) W or WO Contrast Result Date: 12/30/2023 CLINICAL DATA:  High probability pulmonary embolism, c/o, dyspnea, cough EXAM: CT ANGIOGRAPHY CHEST WITH CONTRAST TECHNIQUE: Multidetector CT imaging of the chest was performed using the standard protocol during bolus administration of intravenous contrast. Multiplanar CT image reconstructions and MIPs were obtained to evaluate the vascular anatomy. RADIATION DOSE REDUCTION: This exam was performed according to the departmental dose-optimization program which includes automated exposure control, adjustment of the mA and/or kV according to patient size and/or use of iterative reconstruction technique. CONTRAST:  75mL OMNIPAQUE IOHEXOL 350 MG/ML SOLN COMPARISON:  12/29/2023, 02/22/2019 FINDINGS: Cardiovascular: Adequate opacification of the pulmonary arterial tree. No intraluminal filling defect identified to suggest acute pulmonary embolism. Central pulmonary arteries are of normal caliber. Extensive multi-vessel coronary artery calcification. Cardiac size within normal limits. No pericardial effusion. Mild atherosclerotic calcification within the thoracic aorta. No aortic aneurysm. Mediastinum/Nodes: As noted previously, there is progressive pathologic bilateral supraclavicular, axillary, mediastinal, bilateral hilar adenopathy most in keeping with an inflammatory process, such as sarcoidosis, or a lymphoproliferative process such as  lymphoma or leukemia. Visualized thyroid is unremarkable. The esophagus is unremarkable. Lungs/Pleura: Mild emphysema. Cluster perifissural nodules again identified along the right minor fissure most in keeping with intrapulmonary lymph nodes. Additional 5 mm indeterminate noncalcified pulmonary nodule within the right upper lobe, axial image # 65/5. No confluent pulmonary infiltrate. No pneumothorax or pleural effusion. Upper Abdomen: Mild hepatosplenomegaly with the spleen measuring 13.7 cm in greatest dimension. No acute abnormality. Musculoskeletal: No chest wall abnormality. No acute or significant osseous findings. Review  of the MIP images confirms the above findings. IMPRESSION: 1. No pulmonary embolism. No acute intrathoracic pathology identified. 2. Extensive multi-vessel coronary artery calcification. 3. Progressive pathologic bilateral supraclavicular, axillary, mediastinal, bilateral hilar adenopathy most in keeping with an inflammatory process, such as sarcoidosis, or a lymphoproliferative process such as lymphoma or leukemia. If indicated, left axillary lymphadenopathy may provide a ready target for ultrasound-guided tissue sampling. PET CT examination may also be helpful for staging purposes. 4. Mild hepatosplenomegaly. 5. 5 mm indeterminate noncalcified pulmonary nodule within the right upper lobe. No follow-up needed if patient is low-risk.This recommendation follows the consensus statement: Guidelines for Management of Incidental Pulmonary Nodules Detected on CT Images: From the Fleischner Society 2017; Radiology 2017; 284:228-243. Aortic Atherosclerosis (ICD10-I70.0) and Emphysema (ICD10-J43.9). Electronically Signed   By: Helyn Numbers M.D.   On: 12/30/2023 00:46   CT HEAD WO CONTRAST ( ) Result Date: 12/30/2023 CLINICAL DATA:  Head trauma, minor (Age >= 65y) EXAM: CT HEAD WITHOUT CONTRAST TECHNIQUE: Contiguous axial images were obtained from the base of the skull through the vertex without  intravenous contrast. RADIATION DOSE REDUCTION: This exam was performed according to the departmental dose-optimization program which includes automated exposure control, adjustment of the mA and/or kV according to patient size and/or use of iterative reconstruction technique. COMPARISON:  07/28/2023 FINDINGS: Brain: Normal anatomic configuration. Parenchymal volume loss is commensurate with the patient's age. Areas of cortical encephalomalacia involving the anterior pole of the right temporal lobe and inferolateral aspect the right frontal lobe are again identified and are unchanged. Remote small right cerebellar infarct is unchanged. No abnormal intra or extra-axial mass lesion or fluid collection. No abnormal mass effect or midline shift. No evidence of acute intracranial hemorrhage or infarct. Ventricular size is normal. Cerebellum unremarkable. Vascular: No asymmetric hyperdense vasculature at the skull base. Skull: Intact Sinuses/Orbits: Paranasal sinuses are clear. Remote left inferior orbital wall fracture and left infraorbital ridge ORIF noted. Orbits are otherwise unremarkable. Other: Mastoid air cells and middle ear cavities are clear. IMPRESSION: 1. No acute intracranial abnormality. No calvarial fracture. 2. Stable areas of cortical encephalomalacia involving the anterior pole of the right temporal lobe and inferolateral aspect of the right frontal lobe. Remote small right cerebellar infarct. Electronically Signed   By: Helyn Numbers M.D.   On: 12/30/2023 00:39   CT CHEST WO CONTRAST Result Date: 12/29/2023 CLINICAL DATA:  Respiratory illness, nondiagnostic xray. Recent pneumonia. Left lateral rib pain. Right shoulder pain. Shortness of breath. EXAM: CT CHEST WITHOUT CONTRAST TECHNIQUE: Multidetector CT imaging of the chest was performed following the standard protocol without IV contrast. RADIATION DOSE REDUCTION: This exam was performed according to the departmental dose-optimization program which  includes automated exposure control, adjustment of the mA and/or kV according to patient size and/or use of iterative reconstruction technique. COMPARISON:  02/22/2019.  Chest x-ray today FINDINGS: Cardiovascular: Heart is normal size. Aorta is normal caliber. Coronary artery and aortic atherosclerosis. Mediastinum/Nodes: Mediastinal adenopathy. AP window lymph node has a short axis diameter of 10 mm. Right paratracheal lymph node has a short axis diameter of 11 mm. Subcarinal lymph node has a short axis diameter of 1.7 cm. Bilateral axillary adenopathy with left index node short axis diameter of 1.2 cm and right axillary lymph node having a short axis diameter of 1.1 cm. No visible hilar adenopathy although difficult to assess without IV contrast. Trachea and esophagus are unremarkable. Thyroid unremarkable. Lungs/Pleura: Airway thickening. Left upper lobe nodule measures 6 mm on image 70, new since prior study. Nodularity  noted along the minor fissure is stable. 3 mm inferior right upper lobe nodule on image 74 is stable. Bibasilar atelectasis. No effusions. Upper Abdomen: No acute findings Musculoskeletal: Chest wall soft tissues are unremarkable. No acute bony abnormality. IMPRESSION: Mild airway thickening may reflect bronchitis. Coronary artery disease. Mediastinal and bilateral axillary adenopathy, increasing since prior study. Recommend follow-up CT in 6 months to assess stability. Scattered pulmonary nodules, most of which are stable since prior study and compatible with benign nodules. There is a new left upper lobe nodule measuring 6 mm. Non-contrast chest CT at 3-6 months is recommended. If the nodules are stable at time of repeat CT, then future CT at 18-24 months (from today's scan) is considered optional for low-risk patients, but is recommended for high-risk patients. This recommendation follows the consensus statement: Guidelines for Management of Incidental Pulmonary Nodules Detected on CT Images:  From the Fleischner Society 2017; Radiology 2017; 284:228-243. Aortic Atherosclerosis (ICD10-I70.0). Electronically Signed   By: Charlett Nose M.D.   On: 12/29/2023 21:03   DG Chest Port 1 View Result Date: 12/29/2023 CLINICAL DATA:  Shortness of breath, history of pneumonia EXAM: PORTABLE CHEST 1 VIEW COMPARISON:  12/16/2023 FINDINGS: Two frontal views of the chest demonstrate an unremarkable cardiac silhouette. Improved aeration at the left lung base. No airspace disease, effusion, or pneumothorax. No acute bony abnormalities. IMPRESSION: 1. No acute intrathoracic process. Electronically Signed   By: Sharlet Salina M.D.   On: 12/29/2023 20:34    Scheduled Meds:  azithromycin  250 mg Oral Daily   enoxaparin (LOVENOX) injection  40 mg Subcutaneous Q24H   folic acid  1 mg Oral Daily   insulin aspart  0-5 Units Subcutaneous QHS   insulin aspart  0-9 Units Subcutaneous TID WC   ipratropium-albuterol  3 mL Nebulization Q4H   lidocaine  1 patch Transdermal QHS   LORazepam  0-4 mg Intravenous Q6H   Followed by   Melene Muller ON 01/01/2024] LORazepam  0-4 mg Intravenous Q12H   methylPREDNISolone (SOLU-MEDROL) injection  80 mg Intravenous Q24H   multivitamin with minerals  1 tablet Oral Daily   nicotine  21 mg Transdermal Daily   thiamine  100 mg Oral Daily   Or   thiamine  100 mg Intravenous Daily   Continuous Infusions:  sodium chloride 75 mL/hr at 12/30/23 0019     LOS: 0 days  MDM: Patient is high risk for one or more organ failure.  They necessitate ongoing hospitalization for continued IV therapies and subsequent lab monitoring. Total time spent interpreting labs and vitals, coordinating care amongst consultants and care team members, directly assessing and discussing care with the patient and/or family: 55 min    Debarah Crape, DO [facility]  To contact the attending physician between 7A-7P please use Epic Chat. To contact the covering physician during after hours 7P-7A, please  review Amion.   12/30/2023, 9:48 AM   *This document has been created with the assistance of dictation software. Please excuse typographical errors. *

## 2023-12-30 NOTE — ED Notes (Signed)
 Pt ABCs intact. RR even and unlabored. Pt in NAD but is still very lively, moving all over the bed and constantly requesting food and drinks from every staff member that walks by. Pt informed that he has to wait until the MD gives permission before he can eat or drink. Pt bed in lowest locked position. Call bell in reach. Denies further needs at this time.

## 2023-12-30 NOTE — ED Notes (Signed)
 Pt ABCs intact. RR even and unlabored. Pt in NAD. Bed in lowest locked position. Call bell in reach. Denies needs at this time.
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
## 2023-12-30 NOTE — ED Notes (Signed)
 After receiving approval from provider, pt was given sandwich tray and beverage. Pt was very appreciative. Pt ABCs intact. RR even and unlabored. Pt in NAD. Bed in lowest locked position. Call bell in reach. Denies needs at this time.

## 2023-12-31 ENCOUNTER — Encounter: Payer: Self-pay | Admitting: Internal Medicine

## 2023-12-31 ENCOUNTER — Other Ambulatory Visit: Payer: Self-pay

## 2023-12-31 DIAGNOSIS — J441 Chronic obstructive pulmonary disease with (acute) exacerbation: Secondary | ICD-10-CM | POA: Diagnosis not present

## 2023-12-31 LAB — CBC WITH DIFFERENTIAL/PLATELET
Abs Immature Granulocytes: 0.05 10*3/uL (ref 0.00–0.07)
Basophils Absolute: 0 10*3/uL (ref 0.0–0.1)
Basophils Relative: 0 %
Eosinophils Absolute: 0 10*3/uL (ref 0.0–0.5)
Eosinophils Relative: 0 %
HCT: 37.9 % — ABNORMAL LOW (ref 39.0–52.0)
Hemoglobin: 12.2 g/dL — ABNORMAL LOW (ref 13.0–17.0)
Immature Granulocytes: 1 %
Lymphocytes Relative: 40 %
Lymphs Abs: 4 10*3/uL (ref 0.7–4.0)
MCH: 33.8 pg (ref 26.0–34.0)
MCHC: 32.2 g/dL (ref 30.0–36.0)
MCV: 105 fL — ABNORMAL HIGH (ref 80.0–100.0)
Monocytes Absolute: 0.6 10*3/uL (ref 0.1–1.0)
Monocytes Relative: 6 %
Neutro Abs: 5.3 10*3/uL (ref 1.7–7.7)
Neutrophils Relative %: 53 %
Platelets: 115 10*3/uL — ABNORMAL LOW (ref 150–400)
RBC: 3.61 MIL/uL — ABNORMAL LOW (ref 4.22–5.81)
RDW: 12 % (ref 11.5–15.5)
Smear Review: NORMAL
WBC: 10 10*3/uL (ref 4.0–10.5)
nRBC: 0 % (ref 0.0–0.2)

## 2023-12-31 LAB — COMPREHENSIVE METABOLIC PANEL WITH GFR
ALT: 16 U/L (ref 0–44)
AST: 20 U/L (ref 15–41)
Albumin: 3.2 g/dL — ABNORMAL LOW (ref 3.5–5.0)
Alkaline Phosphatase: 50 U/L (ref 38–126)
Anion gap: 8 (ref 5–15)
BUN: 20 mg/dL (ref 8–23)
CO2: 24 mmol/L (ref 22–32)
Calcium: 8.4 mg/dL — ABNORMAL LOW (ref 8.9–10.3)
Chloride: 103 mmol/L (ref 98–111)
Creatinine, Ser: 0.82 mg/dL (ref 0.61–1.24)
GFR, Estimated: >60 mL/min (ref >60)
Glucose, Bld: 130 mg/dL — ABNORMAL HIGH (ref 70–99)
Potassium: 4 mmol/L (ref 3.5–5.1)
Sodium: 135 mmol/L (ref 135–145)
Total Bilirubin: 0.9 mg/dL (ref 0.0–1.2)
Total Protein: 5.8 g/dL — ABNORMAL LOW (ref 6.5–8.1)

## 2023-12-31 LAB — CBG MONITORING, ED
Glucose-Capillary: 130 mg/dL — ABNORMAL HIGH (ref 70–99)
Glucose-Capillary: 141 mg/dL — ABNORMAL HIGH (ref 70–99)

## 2023-12-31 LAB — PHOSPHORUS: Phosphorus: 3 mg/dL (ref 2.5–4.6)

## 2023-12-31 LAB — LACTIC ACID, PLASMA: Lactic Acid, Venous: 1.8 mmol/L (ref 0.5–1.9)

## 2023-12-31 LAB — MAGNESIUM: Magnesium: 2 mg/dL (ref 1.7–2.4)

## 2023-12-31 MED ORDER — PREDNISONE 10 MG PO TABS
ORAL_TABLET | ORAL | 0 refills | Status: AC
  Filled 2023-12-31: qty 7, 6d supply, fill #0

## 2023-12-31 NOTE — Discharge Summary (Signed)
 Physician Discharge Summary   Patient: Randall Harvey. MRN: [id number] DOB: 1961-04-16  Admit date:     12/29/2023  Discharge date: 12/31/23  Discharge Physician: Debarah Crape   PCP: Margarita Mail, DO   Recommendations at discharge:    Follow up with PCP for chronic medication management. Needs repeat CT in 3-6 months for Lung nodules, mediastinal adenopathy, bilateral axillary adenopathy  Discharge Diagnoses: Principal Problem:   COPD exacerbation (HCC) Active Problems:   Hypotension   Elevated lactic acid level   Pleuritic chest pain   Pulmonary embolus (HCC)   Type 2 diabetes mellitus with obesity (HCC)   Hypokalemia   Fall at home, initial encounter   Lung nodule   Mediastinal adenopathy   Depression with anxiety   Tobacco abuse   Cocaine abuse (HCC)   Polysubstance abuse (HCC)   Alcohol abuse  Resolved Problems:   * No resolved hospital problems. *  Hospital Course: Randall Harvey. is 63 y.o. male with polysubstance abuse (tobacco, alcohol, cocaine), retention, diabetes, COPD, prior CVA, depression, anxiety, BPH, subdural hematoma, renal cell carcinoma, PE/DVT not on anticoagulation, medication nonadherence, who presents with shortness of breath and chest pain which has been progressive over the last several weeks but acutely worsening.  Patient reports he is not taking any medications at home. Patient was seen in the ED on 3/19 and diagnosed with pneumonia.  He was given Augmentin and azithromycin but did not pick up the antibiotics.  Patient also endorses recent falls, he is not sure if he hit his head but had no loss of consciousness. On arrival to the ED his blood pressure was initially 64/48 which improved with 2 L fluid resuscitation.  He was also found to have a lactic acidosis, alcohol level 146, tachycardic to 108.  Chest x-ray was negative for acute finding.  Chest CT revealed possible bronchitis, CAD, mediastinal and bilateral axillary adenopathy and  scattered pulmonary nodules.  Patient was admitted for COPD exacerbation.  Patient was initiated on IV steroids and as needed breathing treatments.  Infectious workup was negative.  No concerns for pneumonia.  His lactic acid gradually resolved to baseline.  By evaluation on 4/3 patient had no further wheezing, was stable on room air, and had returned to his physiologic baseline.   He did complain of right shoulder pain while here.  Reports that this has been ongoing for the last 2 weeks since dislocation 2 weeks prior.  Patient was able to use both hands but does have limited range of motion in right shoulder.  Plain film reveals no acute fracture.  Discussed with him that he may have rotator cuff tear and would benefit from outpatient MRI and workup with orthopedic surgery.  Referral was placed. Had extensive discussion with the patient and the importance of close outpatient follow-up with his primary care team and reiterated the importance of abstinence from alcohol and tobacco.  He endorsed understanding.  He will be discharging home today.  Prednisone Dosepak sent to in-house pharmacy to ensure patient receives it prior to discharge.       COPD exacerbation - Productive cough, dyspnea, wheezing. - Remains on room air, supplemental oxygen as needed - Chest x-ray negative for concurrent pneumonia - Chest CT negative for pulmonary embolism --Steroid taper prescribed at discharge - Patient received Rocephin and azithromycin in the ED, procalcitonin unremarkable, discontinued further antibiotics- COVID/flu/RSV negative - Sputum culture without significant growth   Hypotension - Initially hypotensive to 64/48 which solved after 2.25  L fluid resuscitation - Sepsis ruled out, no fever or leukocytosis.  Hypotension may have been secondary to dehydration   Elevated lactic acid - Resolved now.    Pleuritic chest pain - Troponin 9->10 - History of PE/DVT, chest CT negative - Suspect this is  musculoskeletal secondary to costochondritis from COPD exacerbation as above   History of pulmonary embolism - History of DVT - Not on anticoagulation outpatient.  CTA negative on arrival   Type 2 diabetes with obesity - Hemoglobin A1c 5.6%, well-controlled   Hypokalemia - Replace as needed   Fall at home, initial encounter - Head CT negative - Fall precautions - PT/OT   Lung nodules Mediastinal adenopathy Bilateral axillary adenopathy - As seen on chest CT, increasing since prior study - Needs outpatient follow-up with PCP and follow-up CT scan in 3 to 6 months to reassess   Depression with anxiety - Not on medications outpatient - As needed hydroxyzine   Tobacco abuse - Counseled on cessation - Nicotine patch   Alcohol abuse - EtOH 147 on arrival -- No withdrawal during admission - Last drink: 4/1.  Patient endorses he is not an everyday drinker.   Cocaine use - Have educated on abstinence.   Right shoulder pain - History of dislocation - Film shows no acute fracture.  Needs close outpatient follow-up with orthopedic surgery.  Likely needs MRI   Disposition: Home Diet recommendation:  Discharge Diet Orders (From admission, onward)     Start     Ordered   12/31/23 0000  Diet general        12/31/23 1253           Cardiac and Carb modified diet DISCHARGE MEDICATION: Allergies as of 12/31/2023   No Known Allergies      Medication List     STOP taking these medications    amoxicillin-clavulanate 875-125 MG tablet Commonly known as: AUGMENTIN   azithromycin 250 MG tablet Commonly known as: ZITHROMAX   busPIRone 10 MG tablet Commonly known as: BUSPAR   butalbital-acetaminophen-caffeine 50-325-40 MG tablet Commonly known as: FIORICET   hydrOXYzine 25 MG tablet Commonly known as: ATARAX   meclizine 12.5 MG tablet Commonly known as: ANTIVERT   melatonin 5 MG Tabs   mirtazapine 15 MG tablet Commonly known as: REMERON   multivitamin with  minerals Tabs tablet   naproxen sodium 220 MG tablet Commonly known as: ALEVE   nicotine 14 mg/24hr patch Commonly known as: NICODERM CQ - dosed in mg/24 hours   sertraline 50 MG tablet Commonly known as: ZOLOFT   tamsulosin 0.4 MG Caps capsule Commonly known as: FLOMAX       TAKE these medications    predniSONE 10 MG tablet Commonly known as: DELTASONE Take 2 tablets (20 mg total) by mouth daily with breakfast for 2 days, THEN 1 tablet (10 mg total) daily with breakfast for 2 days, THEN 0.5 tablets (5 mg total) daily with breakfast for 2 days. Start taking on: December 31, 2023        Discharge Exam: Ceasar Mons Weights   12/29/23 2025  Weight: 72.6 kg   Constitutional:  Normal appearance. Non toxic-appearing.  HENT: Head Normocephalic and atraumatic.  Mucous membranes are moist.  Eyes:  Extraocular intact. Conjunctivae normal. Pupils are equal, round, and reactive to light.  Cardiovascular: Rate and Rhythm: Normal rate and regular rhythm.  Pulmonary: Non labored, symmetric rise of chest wall.  No wheeze. Musculoskeletal: Full range of motion of arms below the elbow, patient is  hesitant to move right shoulder, no obvious deformity of right shoulder Skin: warm and dry. not jaundiced.  Neurological: No focal deficit present. alert. Oriented. Psychiatric: Mood and Affect congruent.    Condition at discharge: stable  The results of significant diagnostics from this hospitalization (including imaging, microbiology, ancillary and laboratory) are listed below for reference.   Imaging Studies: DG Shoulder Right Result Date: 12/30/2023 CLINICAL DATA:  Shoulder pain. EXAM: RIGHT SHOULDER - 2+ VIEW COMPARISON:  November 27, 2023. FINDINGS: There is no evidence of fracture or dislocation. There is no evidence of arthropathy. Mild acromial spurring is noted. Soft tissues are unremarkable. IMPRESSION: No acute abnormality seen. Electronically Signed   By: Lupita Raider M.D.   On:  12/30/2023 16:16   CT Angio Chest Pulmonary Embolism (PE) W or WO Contrast Result Date: 12/30/2023 CLINICAL DATA:  High probability pulmonary embolism, c/o, dyspnea, cough EXAM: CT ANGIOGRAPHY CHEST WITH CONTRAST TECHNIQUE: Multidetector CT imaging of the chest was performed using the standard protocol during bolus administration of intravenous contrast. Multiplanar CT image reconstructions and MIPs were obtained to evaluate the vascular anatomy. RADIATION DOSE REDUCTION: This exam was performed according to the departmental dose-optimization program which includes automated exposure control, adjustment of the mA and/or kV according to patient size and/or use of iterative reconstruction technique. CONTRAST:  75mL OMNIPAQUE IOHEXOL 350 MG/ML SOLN COMPARISON:  12/29/2023, 02/22/2019 FINDINGS: Cardiovascular: Adequate opacification of the pulmonary arterial tree. No intraluminal filling defect identified to suggest acute pulmonary embolism. Central pulmonary arteries are of normal caliber. Extensive multi-vessel coronary artery calcification. Cardiac size within normal limits. No pericardial effusion. Mild atherosclerotic calcification within the thoracic aorta. No aortic aneurysm. Mediastinum/Nodes: As noted previously, there is progressive pathologic bilateral supraclavicular, axillary, mediastinal, bilateral hilar adenopathy most in keeping with an inflammatory process, such as sarcoidosis, or a lymphoproliferative process such as lymphoma or leukemia. Visualized thyroid is unremarkable. The esophagus is unremarkable. Lungs/Pleura: Mild emphysema. Cluster perifissural nodules again identified along the right minor fissure most in keeping with intrapulmonary lymph nodes. Additional 5 mm indeterminate noncalcified pulmonary nodule within the right upper lobe, axial image # 65/5. No confluent pulmonary infiltrate. No pneumothorax or pleural effusion. Upper Abdomen: Mild hepatosplenomegaly with the spleen measuring  13.7 cm in greatest dimension. No acute abnormality. Musculoskeletal: No chest wall abnormality. No acute or significant osseous findings. Review of the MIP images confirms the above findings. IMPRESSION: 1. No pulmonary embolism. No acute intrathoracic pathology identified. 2. Extensive multi-vessel coronary artery calcification. 3. Progressive pathologic bilateral supraclavicular, axillary, mediastinal, bilateral hilar adenopathy most in keeping with an inflammatory process, such as sarcoidosis, or a lymphoproliferative process such as lymphoma or leukemia. If indicated, left axillary lymphadenopathy may provide a ready target for ultrasound-guided tissue sampling. PET CT examination may also be helpful for staging purposes. 4. Mild hepatosplenomegaly. 5. 5 mm indeterminate noncalcified pulmonary nodule within the right upper lobe. No follow-up needed if patient is low-risk.This recommendation follows the consensus statement: Guidelines for Management of Incidental Pulmonary Nodules Detected on CT Images: From the Fleischner Society 2017; Radiology 2017; 284:228-243. Aortic Atherosclerosis (ICD10-I70.0) and Emphysema (ICD10-J43.9). Electronically Signed   By: Helyn Numbers M.D.   On: 12/30/2023 00:46   CT HEAD WO CONTRAST ( ) Result Date: 12/30/2023 CLINICAL DATA:  Head trauma, minor (Age >= 65y) EXAM: CT HEAD WITHOUT CONTRAST TECHNIQUE: Contiguous axial images were obtained from the base of the skull through the vertex without intravenous contrast. RADIATION DOSE REDUCTION: This exam was performed according to the departmental dose-optimization program  which includes automated exposure control, adjustment of the mA and/or kV according to patient size and/or use of iterative reconstruction technique. COMPARISON:  07/28/2023 FINDINGS: Brain: Normal anatomic configuration. Parenchymal volume loss is commensurate with the patient's age. Areas of cortical encephalomalacia involving the anterior pole of the right  temporal lobe and inferolateral aspect the right frontal lobe are again identified and are unchanged. Remote small right cerebellar infarct is unchanged. No abnormal intra or extra-axial mass lesion or fluid collection. No abnormal mass effect or midline shift. No evidence of acute intracranial hemorrhage or infarct. Ventricular size is normal. Cerebellum unremarkable. Vascular: No asymmetric hyperdense vasculature at the skull base. Skull: Intact Sinuses/Orbits: Paranasal sinuses are clear. Remote left inferior orbital wall fracture and left infraorbital ridge ORIF noted. Orbits are otherwise unremarkable. Other: Mastoid air cells and middle ear cavities are clear. IMPRESSION: 1. No acute intracranial abnormality. No calvarial fracture. 2. Stable areas of cortical encephalomalacia involving the anterior pole of the right temporal lobe and inferolateral aspect of the right frontal lobe. Remote small right cerebellar infarct. Electronically Signed   By: Helyn Numbers M.D.   On: 12/30/2023 00:39   CT CHEST WO CONTRAST Result Date: 12/29/2023 CLINICAL DATA:  Respiratory illness, nondiagnostic xray. Recent pneumonia. Left lateral rib pain. Right shoulder pain. Shortness of breath. EXAM: CT CHEST WITHOUT CONTRAST TECHNIQUE: Multidetector CT imaging of the chest was performed following the standard protocol without IV contrast. RADIATION DOSE REDUCTION: This exam was performed according to the departmental dose-optimization program which includes automated exposure control, adjustment of the mA and/or kV according to patient size and/or use of iterative reconstruction technique. COMPARISON:  02/22/2019.  Chest x-ray today FINDINGS: Cardiovascular: Heart is normal size. Aorta is normal caliber. Coronary artery and aortic atherosclerosis. Mediastinum/Nodes: Mediastinal adenopathy. AP window lymph node has a short axis diameter of 10 mm. Right paratracheal lymph node has a short axis diameter of 11 mm. Subcarinal lymph  node has a short axis diameter of 1.7 cm. Bilateral axillary adenopathy with left index node short axis diameter of 1.2 cm and right axillary lymph node having a short axis diameter of 1.1 cm. No visible hilar adenopathy although difficult to assess without IV contrast. Trachea and esophagus are unremarkable. Thyroid unremarkable. Lungs/Pleura: Airway thickening. Left upper lobe nodule measures 6 mm on image 70, new since prior study. Nodularity noted along the minor fissure is stable. 3 mm inferior right upper lobe nodule on image 74 is stable. Bibasilar atelectasis. No effusions. Upper Abdomen: No acute findings Musculoskeletal: Chest wall soft tissues are unremarkable. No acute bony abnormality. IMPRESSION: Mild airway thickening may reflect bronchitis. Coronary artery disease. Mediastinal and bilateral axillary adenopathy, increasing since prior study. Recommend follow-up CT in 6 months to assess stability. Scattered pulmonary nodules, most of which are stable since prior study and compatible with benign nodules. There is a new left upper lobe nodule measuring 6 mm. Non-contrast chest CT at 3-6 months is recommended. If the nodules are stable at time of repeat CT, then future CT at 18-24 months (from today's scan) is considered optional for low-risk patients, but is recommended for high-risk patients. This recommendation follows the consensus statement: Guidelines for Management of Incidental Pulmonary Nodules Detected on CT Images: From the Fleischner Society 2017; Radiology 2017; 284:228-243. Aortic Atherosclerosis (ICD10-I70.0). Electronically Signed   By: Charlett Nose M.D.   On: 12/29/2023 21:03   DG Chest Port 1 View Result Date: 12/29/2023 CLINICAL DATA:  Shortness of breath, history of pneumonia EXAM: PORTABLE CHEST 1  VIEW COMPARISON:  12/16/2023 FINDINGS: Two frontal views of the chest demonstrate an unremarkable cardiac silhouette. Improved aeration at the left lung base. No airspace disease,  effusion, or pneumothorax. No acute bony abnormalities. IMPRESSION: 1. No acute intrathoracic process. Electronically Signed   By: Sharlet Salina M.D.   On: 12/29/2023 20:34   DG Chest 2 View Result Date: 12/16/2023 CLINICAL DATA:  Weakness and dizziness. EXAM: CHEST - 2 VIEW COMPARISON:  June 28, 2021 FINDINGS: The heart size and mediastinal contours are within normal limits. Mild atelectasis and/or infiltrate is seen within the left lung base. No pleural effusion or pneumothorax is identified. The visualized skeletal structures are unremarkable. IMPRESSION: Mild left basilar atelectasis and/or infiltrate. Electronically Signed   By: Aram Candela M.D.   On: 12/16/2023 22:25    Microbiology: Results for orders placed or performed during the hospital encounter of 12/29/23  Culture, blood (Routine x 2)     Status: None (Preliminary result)   Collection Time: 12/29/23  8:28 PM   Specimen: BLOOD  Result Value Ref Range Status   Specimen Description BLOOD BLOOD RIGHT FOREARM  Final   Special Requests   Final    BOTTLES DRAWN AEROBIC AND ANAEROBIC Blood Culture results may not be optimal due to an inadequate volume of blood received in culture bottles   Culture   Final    NO GROWTH 2 DAYS Performed at [facility], [address] [place], [place] [address]    Report Status PENDING  Incomplete  Culture, blood (Routine x 2)     Status: None (Preliminary result)   Collection Time: 12/29/23  8:28 PM   Specimen: BLOOD  Result Value Ref Range Status   Specimen Description BLOOD BLOOD LEFT FOREARM  Final   Special Requests   Final    BOTTLES DRAWN AEROBIC AND ANAEROBIC Blood Culture adequate volume   Culture   Final    NO GROWTH 2 DAYS Performed at [facility], [address], [place], [place] [address]    Report Status PENDING  Incomplete  Resp panel by RT-PCR (RSV, Flu A&B, Covid) Anterior Nasal Swab     Status: None   Collection Time: 12/29/23 10:15 PM    Specimen: Anterior Nasal Swab  Result Value Ref Range Status   SARS Coronavirus 2 by RT PCR NEGATIVE NEGATIVE Final    Comment: (NOTE) SARS-CoV-2 target nucleic acids are NOT DETECTED.  The SARS-CoV-2 RNA is generally detectable in upper respiratory specimens during the acute phase of infection. The lowest concentration of SARS-CoV-2 viral copies this assay can detect is 138 copies/mL. A negative result does not preclude SARS-Cov-2 infection and should not be used as the sole basis for treatment or other patient management decisions. A negative result may occur with  improper specimen collection/handling, submission of specimen other than nasopharyngeal swab, presence of viral mutation(s) within the areas targeted by this assay, and inadequate number of viral copies(<138 copies/mL). A negative result must be combined with clinical observations, patient history, and epidemiological information. The expected result is Negative.  Fact Sheet for Patients:  BloggerCourse.com  Fact Sheet for Healthcare Providers:  SeriousBroker.it  This test is no t yet approved or cleared by the [place] FDA and  has been authorized for detection and/or diagnosis of SARS-CoV-2 by FDA under an Emergency Use Authorization (EUA). This EUA will remain  in effect (meaning this test can be used) for the duration of the COVID-19 declaration under Section 564(b)(1) of the Act, 21 U.S.C.section  360bbb-3(b)(1), unless the authorization is terminated  or revoked sooner.       Influenza A by PCR NEGATIVE NEGATIVE Final   Influenza B by PCR NEGATIVE NEGATIVE Final    Comment: (NOTE) The Xpert Xpress SARS-CoV-2/FLU/RSV plus assay is intended as an aid in the diagnosis of influenza from Nasopharyngeal swab specimens and should not be used as a sole basis for treatment. Nasal washings and aspirates are unacceptable for Xpert Xpress  SARS-CoV-2/FLU/RSV testing.  Fact Sheet for Patients: BloggerCourse.com  Fact Sheet for Healthcare Providers: SeriousBroker.it  This test is not yet approved or cleared by the [place] FDA and has been authorized for detection and/or diagnosis of SARS-CoV-2 by FDA under an Emergency Use Authorization (EUA). This EUA will remain in effect (meaning this test can be used) for the duration of the COVID-19 declaration under Section 564(b)(1) of the Act, 21 U.S.C. section 360bbb-3(b)(1), unless the authorization is terminated or revoked.     Resp Syncytial Virus by PCR NEGATIVE NEGATIVE Final    Comment: (NOTE) Fact Sheet for Patients: BloggerCourse.com  Fact Sheet for Healthcare Providers: SeriousBroker.it  This test is not yet approved or cleared by the [place] FDA and has been authorized for detection and/or diagnosis of SARS-CoV-2 by FDA under an Emergency Use Authorization (EUA). This EUA will remain in effect (meaning this test can be used) for the duration of the COVID-19 declaration under Section 564(b)(1) of the Act, 21 U.S.C. section 360bbb-3(b)(1), unless the authorization is terminated or revoked.  Performed at [facility], [address] [place], [place] [address]   Expectorated Sputum Assessment w Gram Stain, Rflx to Resp Cult     Status: None   Collection Time: 12/30/23  5:19 AM   Specimen: Expectorated Sputum  Result Value Ref Range Status   Specimen Description EXPECTORATED SPUTUM  Final   Special Requests NONE  Final   Sputum evaluation   Final    THIS SPECIMEN IS ACCEPTABLE FOR SPUTUM CULTURE Performed at [facility], [address], [place], [place] [address]    Report Status 12/30/2023 FINAL  Final  Culture, Respiratory w Gram Stain     Status: None (Preliminary result)   Collection Time: 12/30/23  5:19 AM  Result Value  Ref Range Status   Specimen Description   Final    EXPECTORATED SPUTUM Performed at [facility], [address], [place], [place] [address]    Special Requests   Final    NONE Reflexed from [phone] Performed at [facility], [address] [place], [place] [address]    Gram Stain   Final    NO WBC SEEN RARE GRAM POSITIVE COCCI IN PAIRS RARE GRAM POSITIVE RODS Performed at [facility], [address] [place], [place] [address]    Culture PENDING  Incomplete   Report Status PENDING  Incomplete    Labs: CBC: Recent Labs  Lab 12/29/23 2029 12/30/23 0519 12/31/23 0439  WBC 8.6 5.4 10.0  NEUTROABS 4.3  --  5.3  HGB 15.2 12.9* 12.2*  HCT 44.9 38.3* 37.9*  MCV 98.5 99.5 105.0*  PLT 186 129* 115*   Basic Metabolic Panel: Recent Labs  Lab 12/29/23 2029 12/29/23 2315 12/30/23 0519 12/31/23 0439  NA 137  --  135 135  K 3.1*  --  4.0 4.0  CL 102  --  102 103  CO2 18*  --  23 24  GLUCOSE 223*  --  233* 130*  BUN 20  --  16 20  CREATININE 1.22  --  0.99 0.82  CALCIUM 8.7*  --  8.0* 8.4*  MG  --  1.9  --  2.0  PHOS  --  2.9  --  3.0   Liver Function Tests: Recent Labs  Lab 12/29/23 2029 12/31/23 0439  AST 27 20  ALT 24 16  ALKPHOS 77 50  BILITOT 0.8 0.9  PROT 7.1 5.8*  ALBUMIN 3.9 3.2*   CBG: Recent Labs  Lab 12/30/23 1115 12/30/23 1716 12/30/23 2125 12/31/23 0749 12/31/23 1148  GLUCAP 228* 257* 216* 130* 141*    Discharge time spent: 31 minutes.  Signed: Debarah Crape, DO [facility] 12/31/2023

## 2023-12-31 NOTE — Care Management Obs Status (Signed)
 MEDICARE OBSERVATION STATUS NOTIFICATION   Patient Details  Name: Randall Harvey. MRN: [id number] Date of Birth: Jun 07, 1961   Medicare Observation Status Notification Given:  Orland Dec, CMA 12/31/2023, 9:48 AM

## 2023-12-31 NOTE — Hospital Course (Signed)
 Randall Harvey. is 63 y.o. male with polysubstance abuse (tobacco, alcohol, cocaine), retention, diabetes, COPD, prior CVA, depression, anxiety, BPH, subdural hematoma, renal cell carcinoma, PE/DVT not on anticoagulation, medication nonadherence, who presents with shortness of breath and chest pain which has been progressive over the last several weeks but acutely worsening.  Patient reports he is not taking any medications at home. Patient was seen in the ED on 3/19 and diagnosed with pneumonia.  He was given Augmentin and azithromycin but did not pick up the antibiotics.  Patient also endorses recent falls, he is not sure if he hit his head but had no loss of consciousness. On arrival to the ED his blood pressure was initially 64/48 which improved with 2 L fluid resuscitation.  He was also found to have a lactic acidosis, alcohol level 146, tachycardic to 108.  Chest x-ray was negative for acute finding.  Chest CT revealed possible bronchitis, CAD, mediastinal and bilateral axillary adenopathy and scattered pulmonary nodules.  Patient was admitted for COPD exacerbation.  Patient was initiated on IV steroids and as needed breathing treatments.  Infectious workup was negative.  No concerns for pneumonia.  His lactic acid gradually resolved to baseline.  By evaluation on 4/3 patient had no further wheezing, was stable on room air, and had returned to his physiologic baseline.   He did complain of right shoulder pain while here.  Reports that this has been ongoing for the last 2 weeks since dislocation 2 weeks prior.  Patient was able to use both hands but does have limited range of motion in right shoulder.  Plain film reveals no acute fracture.  Discussed with him that he may have rotator cuff tear and would benefit from outpatient MRI and workup with orthopedic surgery.  Referral was placed. Had extensive discussion with the patient and the importance of close outpatient follow-up with his primary care team and  reiterated the importance of abstinence from alcohol and tobacco.  He endorsed understanding.  He will be discharging home today.  Prednisone Dosepak sent to in-house pharmacy to ensure patient receives it prior to discharge.       COPD exacerbation - Productive cough, dyspnea, wheezing. - Remains on room air, supplemental oxygen as needed - Chest x-ray negative for concurrent pneumonia - Chest CT negative for pulmonary embolism --Steroid taper prescribed at discharge - Patient received Rocephin and azithromycin in the ED, procalcitonin unremarkable, discontinued further antibiotics- COVID/flu/RSV negative - Sputum culture without significant growth   Hypotension - Initially hypotensive to 64/48 which solved after 2.25 L fluid resuscitation - Sepsis ruled out, no fever or leukocytosis.  Hypotension may have been secondary to dehydration   Elevated lactic acid - Resolved now.    Pleuritic chest pain - Troponin 9->10 - History of PE/DVT, chest CT negative - Suspect this is musculoskeletal secondary to costochondritis from COPD exacerbation as above   History of pulmonary embolism - History of DVT - Not on anticoagulation outpatient.  CTA negative on arrival   Type 2 diabetes with obesity - Hemoglobin A1c 5.6%, well-controlled   Hypokalemia - Replace as needed   Fall at home, initial encounter - Head CT negative - Fall precautions - PT/OT   Lung nodules Mediastinal adenopathy Bilateral axillary adenopathy - As seen on chest CT, increasing since prior study - Needs outpatient follow-up with PCP and follow-up CT scan in 3 to 6 months to reassess   Depression with anxiety - Not on medications outpatient - As needed hydroxyzine  Tobacco abuse - Counseled on cessation - Nicotine patch   Alcohol abuse - EtOH 147 on arrival -- No withdrawal during admission - Last drink: 4/1.  Patient endorses he is not an everyday drinker.   Cocaine use - Have educated on  abstinence.   Right shoulder pain - History of dislocation - Film shows no acute fracture.  Needs close outpatient follow-up with orthopedic surgery.  Likely needs MRI

## 2024-01-01 LAB — CULTURE, RESPIRATORY W GRAM STAIN: Gram Stain: NONE SEEN

## 2024-01-03 LAB — CULTURE, BLOOD (ROUTINE X 2)
Culture: NO GROWTH
Culture: NO GROWTH
Special Requests: ADEQUATE

## 2024-01-06 ENCOUNTER — Emergency Department

## 2024-01-06 ENCOUNTER — Encounter: Payer: Self-pay | Admitting: *Deleted

## 2024-01-06 ENCOUNTER — Emergency Department
Admission: EM | Admit: 2024-01-06 | Discharge: 2024-01-07 | Disposition: A | Discharge status: Other Acute Inpatient Hospital | Attending: Student in an Organized Health Care Education/Training Program | Admitting: Student in an Organized Health Care Education/Training Program

## 2024-01-06 ENCOUNTER — Other Ambulatory Visit: Payer: Self-pay

## 2024-01-06 DIAGNOSIS — Z7901 Long term (current) use of anticoagulants: Secondary | ICD-10-CM | POA: Diagnosis not present

## 2024-01-06 DIAGNOSIS — F1014 Alcohol abuse with alcohol-induced mood disorder: Secondary | ICD-10-CM | POA: Insufficient documentation

## 2024-01-06 DIAGNOSIS — F1994 Other psychoactive substance use, unspecified with psychoactive substance-induced mood disorder: Secondary | ICD-10-CM | POA: Diagnosis present

## 2024-01-06 DIAGNOSIS — R45851 Suicidal ideations: Secondary | ICD-10-CM | POA: Diagnosis not present

## 2024-01-06 DIAGNOSIS — F329 Major depressive disorder, single episode, unspecified: Secondary | ICD-10-CM | POA: Diagnosis not present

## 2024-01-06 DIAGNOSIS — F1010 Alcohol abuse, uncomplicated: Secondary | ICD-10-CM

## 2024-01-06 DIAGNOSIS — R0602 Shortness of breath: Secondary | ICD-10-CM | POA: Insufficient documentation

## 2024-01-06 DIAGNOSIS — J449 Chronic obstructive pulmonary disease, unspecified: Secondary | ICD-10-CM | POA: Diagnosis not present

## 2024-01-06 DIAGNOSIS — Y908 Blood alcohol level of 240 mg/100 ml or more: Secondary | ICD-10-CM | POA: Insufficient documentation

## 2024-01-06 DIAGNOSIS — Z85528 Personal history of other malignant neoplasm of kidney: Secondary | ICD-10-CM | POA: Diagnosis not present

## 2024-01-06 DIAGNOSIS — F17200 Nicotine dependence, unspecified, uncomplicated: Secondary | ICD-10-CM | POA: Insufficient documentation

## 2024-01-06 DIAGNOSIS — I10 Essential (primary) hypertension: Secondary | ICD-10-CM | POA: Diagnosis not present

## 2024-01-06 LAB — BASIC METABOLIC PANEL WITH GFR
Anion gap: 9 (ref 5–15)
BUN: 17 mg/dL (ref 8–23)
CO2: 24 mmol/L (ref 22–32)
Calcium: 7.7 mg/dL — ABNORMAL LOW (ref 8.9–10.3)
Chloride: 104 mmol/L (ref 98–111)
Creatinine, Ser: 1.14 mg/dL (ref 0.61–1.24)
GFR, Estimated: >60 mL/min (ref >60)
Glucose, Bld: 91 mg/dL (ref 70–99)
Potassium: 3.7 mmol/L (ref 3.5–5.1)
Sodium: 137 mmol/L (ref 135–145)

## 2024-01-06 LAB — URINE DRUG SCREEN, QUALITATIVE (ARMC ONLY)
Amphetamines, Ur Screen: NOT DETECTED
Barbiturates, Ur Screen: NOT DETECTED
Benzodiazepine, Ur Scrn: NOT DETECTED
Cannabinoid 50 Ng, Ur ~~LOC~~: NOT DETECTED
Cocaine Metabolite,Ur ~~LOC~~: NOT DETECTED
MDMA (Ecstasy)Ur Screen: NOT DETECTED
Methadone Scn, Ur: NOT DETECTED
Opiate, Ur Screen: NOT DETECTED
Phencyclidine (PCP) Ur S: NOT DETECTED
Tricyclic, Ur Screen: NOT DETECTED

## 2024-01-06 LAB — ETHANOL: Alcohol, Ethyl (B): 290 mg/dL — ABNORMAL HIGH (ref <10)

## 2024-01-06 LAB — CBC
HCT: 46.5 % (ref 39.0–52.0)
Hemoglobin: 15.6 g/dL (ref 13.0–17.0)
MCH: 33.5 pg (ref 26.0–34.0)
MCHC: 33.5 g/dL (ref 30.0–36.0)
MCV: 100 fL (ref 80.0–100.0)
Platelets: 185 10*3/uL (ref 150–400)
RBC: 4.65 MIL/uL (ref 4.22–5.81)
RDW: 11.9 % (ref 11.5–15.5)
WBC: 16.8 10*3/uL — ABNORMAL HIGH (ref 4.0–10.5)
nRBC: 0.1 % (ref 0.0–0.2)

## 2024-01-06 LAB — ACETAMINOPHEN LEVEL: Acetaminophen (Tylenol), Serum: <10 ug/mL — ABNORMAL LOW (ref 10–30)

## 2024-01-06 LAB — TROPONIN I (HIGH SENSITIVITY): Troponin I (High Sensitivity): 9 ng/L (ref <18)

## 2024-01-06 LAB — SALICYLATE LEVEL: Salicylate Lvl: <7 mg/dL — ABNORMAL LOW (ref 7.0–30.0)

## 2024-01-06 MED ORDER — THIAMINE HCL 100 MG/ML IJ SOLN: 100.0000 mg | Freq: Every day | INTRAMUSCULAR | Status: DC

## 2024-01-06 MED ORDER — MIDAZOLAM HCL 2 MG/2ML IJ SOLN
2.0000 mg | Freq: Once | INTRAMUSCULAR | Status: AC
  Administered 2024-01-06: 2 mg via INTRAMUSCULAR
  Filled 2024-01-06: qty 2

## 2024-01-06 MED ORDER — LORAZEPAM 2 MG/ML IJ SOLN: 0.0000 mg | Freq: Two times a day (BID) | INTRAMUSCULAR | Status: DC

## 2024-01-06 MED ORDER — HALOPERIDOL LACTATE 5 MG/ML IJ SOLN
5.0000 mg | Freq: Four times a day (QID) | INTRAMUSCULAR | Status: DC | PRN
  Administered 2024-01-06: 5 mg via INTRAMUSCULAR
  Filled 2024-01-06: qty 1

## 2024-01-06 MED ORDER — THIAMINE MONONITRATE 100 MG PO TABS
100.0000 mg | ORAL_TABLET | Freq: Every day | ORAL | Status: DC
  Administered 2024-01-07: 100 mg via ORAL
  Filled 2024-01-06 (×2): qty 1

## 2024-01-06 MED ORDER — LORAZEPAM 2 MG/ML IJ SOLN: 0.0000 mg | Freq: Four times a day (QID) | INTRAMUSCULAR | Status: DC

## 2024-01-06 MED ORDER — LORAZEPAM 2 MG PO TABS
0.0000 mg | ORAL_TABLET | Freq: Four times a day (QID) | ORAL | Status: DC
  Administered 2024-01-07: 2 mg via ORAL
  Filled 2024-01-06: qty 1

## 2024-01-06 MED ORDER — LORAZEPAM 2 MG PO TABS: 0.0000 mg | ORAL_TABLET | Freq: Two times a day (BID) | ORAL | Status: DC

## 2024-01-06 NOTE — ED Triage Notes (Addendum)
 Pt to triage via wheelchair.  Pt reports he had a dizzy spell and fell out.  Pt brought in via ems.  Pt denies chest pain.  Pt reports sob.  Pt tearful in triage.  Pt alert  speech clear.   Pt admits to etoh use.  Pt now stating he wants to die.

## 2024-01-06 NOTE — ED Notes (Signed)
 Pt in hallway, yelling and being belligerent.

## 2024-01-06 NOTE — ED Notes (Signed)
 Pt is out of his room again stating that he wants to leave. Pt now IVC. Pt cursing at security.

## 2024-01-06 NOTE — ED Notes (Signed)
 RN was attempting to complete psych assessment on pt when pt started yelling at RN to leave him alone.  Prior to his outburst, pt stated that he did have thoughts of harming himself but did not have a specific plan. Pt reports hx/o SI attempt in the past. Pt reports he did drink 4 oz of wine today, but he is not an everyday drinker. Pt reports that he does hear voices and that they are "evil". Pt endorses nausea and HA. Pt denies tactile or visual hallucinations.

## 2024-01-06 NOTE — ED Provider Notes (Signed)
 [facility] Provider Note    Event Date/Time   First MD Initiated Contact with Patient 01/06/24 1742     (approximate)   History   Dizziness   HPI  Randall Harvey. is a 63 y.o. male with a history of alcohol abuse as well as suicidal ideation having been admitted to inpatient psychiatry back in October presents to the ER for evaluation of thoughts of harming himself.  He keeps on repeating "I want to go home.  "Patient somewhat agitated appearing.  States he did not want to be brought here.  Does admit to drinking wine today.  There is report of a dizzy spell but patient denies hitting his head.  No headache.  No numbness or tingling.     Physical Exam   Triage Vital Signs: ED Triage Vitals  Encounter Vitals Group     BP 01/06/24 1646 100/72     Systolic BP Percentile --      Diastolic BP Percentile --      Pulse Rate 01/06/24 1646 84     Resp 01/06/24 1646 18     Temp 01/06/24 1646 98.4 F (36.9 C)     Temp Source 01/06/24 1646 Oral     SpO2 01/06/24 1646 99 %     Weight 01/06/24 1640 162 lb (73.5 kg)     Height 01/06/24 1640 5\' 7"  (1.702 m)     Head Circumference --      Peak Flow --      Pain Score 01/06/24 1640 0     Pain Loc --      Pain Education --      Exclude from Growth Chart --     Most recent vital signs: Vitals:   01/06/24 1646  BP: 100/72  Pulse: 84  Resp: 18  Temp: 98.4 F (36.9 C)  SpO2: 99%     Constitutional: Alert  Eyes: Conjunctivae are normal.  Head: Atraumatic. Nose: No congestion/rhinnorhea. Mouth/Throat: Mucous membranes are moist.   Neck: Painless ROM.  Cardiovascular:   Good peripheral circulation. Respiratory: Normal respiratory effort.  No retractions.  Gastrointestinal: Soft and nontender.  Musculoskeletal:  no deformity Neurologic:  MAE spontaneously. No gross focal neurologic deficits are appreciated.  Skin:  Skin is warm, dry and intact. No rash noted. Psychiatric: somewhat agitated appearing,  tearful.    ED Results / Procedures / Treatments   Labs (all labs ordered are listed, but only abnormal results are displayed) Labs Reviewed  BASIC METABOLIC PANEL WITH GFR - Abnormal; Notable for the following components:      Result Value   Calcium 7.7 (*)    All other components within normal limits  CBC - Abnormal; Notable for the following components:   WBC 16.8 (*)    All other components within normal limits  ETHANOL - Abnormal; Notable for the following components:   Alcohol, Ethyl (B) 290 (*)    All other components within normal limits  SALICYLATE LEVEL - Abnormal; Notable for the following components:   Salicylate Lvl <7.0 (*)    All other components within normal limits  ACETAMINOPHEN LEVEL - Abnormal; Notable for the following components:   Acetaminophen (Tylenol), Serum <10 (*)    All other components within normal limits  URINE DRUG SCREEN, QUALITATIVE ([facility] ONLY)  TROPONIN I (HIGH SENSITIVITY)  TROPONIN I (HIGH SENSITIVITY)     EKG  ED ECG REPORT I, Willy Eddy, the attending physician, personally viewed and interpreted this ECG.  Date: 01/06/2024  EKG Time: 16:55  Rate: 80  Rhythm: sinus  Axis: right  Intervals: normal  ST&T Change: no stemi, no depression    RADIOLOGY    PROCEDURES:  Critical Care performed:   Procedures   MEDICATIONS ORDERED IN ED: Medications  haloperidol lactate (HALDOL) injection 5 mg (has no administration in time range)  midazolam (VERSED) injection 2 mg (has no administration in time range)     IMPRESSION / MDM / ASSESSMENT AND PLAN / ED COURSE  I reviewed the triage vital signs and the nursing notes.                              Differential diagnosis includes, but is not limited to, Psychosis, delirium, medication effect, noncompliance, polysubstance abuse, Si, Hi, depression  Patient presenting to the ER for evaluation of symptoms as described above.  Based on symptoms, risk factors and  considered above differential, this presenting complaint could reflect a potentially life-threatening illness therefore the patient will be placed on continuous pulse oximetry and telemetry for monitoring.  Laboratory evaluation will be sent to evaluate for the above complaints.  Patient is intoxicated but becoming increasingly agitated reporting suicidal ideation.  Has been admitted to psychiatric facility in the past.  Threatening to leave.  Will be placed under IVC.  Due to his escalating agitation we will provide IM Haldol and Versed for calming agents.  I do suspect a large component of this is secondary to alcohol.  Will continue observe.  Will consult psychiatry as patient has been admitted for same in the past.        FINAL CLINICAL IMPRESSION(S) / ED DIAGNOSES   Final diagnoses:  Suicidal ideation  Alcohol abuse     Rx / DC Orders   ED Discharge Orders     None        Note:  This document was prepared using Dragon voice recognition software and may include unintentional dictation errors.    Willy Eddy, MD 01/06/24 [phone]

## 2024-01-06 NOTE — ED Notes (Signed)
 IVC PENDING  CONSULT ?

## 2024-01-06 NOTE — ED Triage Notes (Signed)
 First Nurse Note: [facility]. Pt reports abrasion to need. ETOH on board.  105/66 100% RA 87 HR 96 cbg

## 2024-01-06 NOTE — BH Assessment (Signed)
 Attempted to assess patient with  Psyc NP but patient has been medicated, he was somewhat difficult to arouse, wasn't quite oriented and had a bit of agitation. Psyc to attempt at a later time

## 2024-01-06 NOTE — ED Provider Triage Note (Signed)
 Emergency Medicine Provider Triage Evaluation Note  Randall Harvey. , a 63 y.o. male  was evaluated in triage.  Pt complains of dizziness and falling  in the street. Patient states SOB, drank one glass of wine this morning. Patient states this is not his first episode.  Review of Systems  Positive:  Negative:   Physical Exam  BP 100/72 (BP Location: Left Arm)   Pulse 84   Temp 98.4 F (36.9 C) (Oral)   Resp 18   Ht 5\' 7"  (1.702 m)   Wt 73.5 kg   SpO2 99%   BMI 25.37 kg/m  Gen:   Awake, no distress . Easy crying, sometimes agressive.  Resp:  Normal effort  MSK:   Moves extremities without difficulty  Other:    Medical Decision Making  Medically screening exam initiated at 4:48 PM.  Appropriate orders placed.  Randall Harvey. was informed that the remainder of the evaluation will be completed by another provider, this initial triage assessment does not replace that evaluation, and the importance of remaining in the ED until their evaluation is complete.  Patient with a near dizziness. Ordered CBC,CMP   Gladys Damme, PA-C 01/06/24 1651

## 2024-01-07 ENCOUNTER — Inpatient Hospital Stay
Admission: RE | Admit: 2024-01-07 | Discharge: 2024-01-09 | DRG: 885 | Disposition: A | Discharge status: Home / Self Care | Source: Intra-hospital | Attending: Psychiatry | Admitting: Psychiatry

## 2024-01-07 ENCOUNTER — Encounter: Payer: Self-pay | Admitting: Psychiatry

## 2024-01-07 ENCOUNTER — Other Ambulatory Visit: Payer: Self-pay

## 2024-01-07 DIAGNOSIS — Z86718 Personal history of other venous thrombosis and embolism: Secondary | ICD-10-CM | POA: Diagnosis not present

## 2024-01-07 DIAGNOSIS — Z5982 Transportation insecurity: Secondary | ICD-10-CM | POA: Diagnosis not present

## 2024-01-07 DIAGNOSIS — Z85528 Personal history of other malignant neoplasm of kidney: Secondary | ICD-10-CM | POA: Diagnosis not present

## 2024-01-07 DIAGNOSIS — J449 Chronic obstructive pulmonary disease, unspecified: Secondary | ICD-10-CM | POA: Diagnosis present

## 2024-01-07 DIAGNOSIS — F1014 Alcohol abuse with alcohol-induced mood disorder: Secondary | ICD-10-CM | POA: Diagnosis not present

## 2024-01-07 DIAGNOSIS — I10 Essential (primary) hypertension: Secondary | ICD-10-CM | POA: Diagnosis present

## 2024-01-07 DIAGNOSIS — F329 Major depressive disorder, single episode, unspecified: Secondary | ICD-10-CM

## 2024-01-07 DIAGNOSIS — E669 Obesity, unspecified: Secondary | ICD-10-CM | POA: Diagnosis present

## 2024-01-07 DIAGNOSIS — Z800 Family history of malignant neoplasm of digestive organs: Secondary | ICD-10-CM | POA: Diagnosis not present

## 2024-01-07 DIAGNOSIS — Z604 Social exclusion and rejection: Secondary | ICD-10-CM | POA: Diagnosis present

## 2024-01-07 DIAGNOSIS — G8929 Other chronic pain: Secondary | ICD-10-CM | POA: Diagnosis present

## 2024-01-07 DIAGNOSIS — Z86711 Personal history of pulmonary embolism: Secondary | ICD-10-CM | POA: Diagnosis not present

## 2024-01-07 DIAGNOSIS — F339 Major depressive disorder, recurrent, unspecified: Secondary | ICD-10-CM | POA: Diagnosis present

## 2024-01-07 DIAGNOSIS — F10239 Alcohol dependence with withdrawal, unspecified: Secondary | ICD-10-CM | POA: Diagnosis present

## 2024-01-07 DIAGNOSIS — Z87820 Personal history of traumatic brain injury: Secondary | ICD-10-CM

## 2024-01-07 DIAGNOSIS — Z825 Family history of asthma and other chronic lower respiratory diseases: Secondary | ICD-10-CM

## 2024-01-07 DIAGNOSIS — Z823 Family history of stroke: Secondary | ICD-10-CM

## 2024-01-07 DIAGNOSIS — Z5941 Food insecurity: Secondary | ICD-10-CM

## 2024-01-07 DIAGNOSIS — Z7901 Long term (current) use of anticoagulants: Secondary | ICD-10-CM

## 2024-01-07 DIAGNOSIS — M1712 Unilateral primary osteoarthritis, left knee: Secondary | ICD-10-CM | POA: Diagnosis present

## 2024-01-07 DIAGNOSIS — Z8249 Family history of ischemic heart disease and other diseases of the circulatory system: Secondary | ICD-10-CM

## 2024-01-07 DIAGNOSIS — Z5948 Other specified lack of adequate food: Secondary | ICD-10-CM

## 2024-01-07 DIAGNOSIS — F1994 Other psychoactive substance use, unspecified with psychoactive substance-induced mood disorder: Secondary | ICD-10-CM | POA: Diagnosis present

## 2024-01-07 DIAGNOSIS — Z8041 Family history of malignant neoplasm of ovary: Secondary | ICD-10-CM

## 2024-01-07 DIAGNOSIS — R7303 Prediabetes: Secondary | ICD-10-CM | POA: Diagnosis present

## 2024-01-07 DIAGNOSIS — F419 Anxiety disorder, unspecified: Secondary | ICD-10-CM | POA: Diagnosis present

## 2024-01-07 DIAGNOSIS — Z8673 Personal history of transient ischemic attack (TIA), and cerebral infarction without residual deficits: Secondary | ICD-10-CM

## 2024-01-07 DIAGNOSIS — Z905 Acquired absence of kidney: Secondary | ICD-10-CM

## 2024-01-07 DIAGNOSIS — Z79899 Other long term (current) drug therapy: Secondary | ICD-10-CM

## 2024-01-07 DIAGNOSIS — Y908 Blood alcohol level of 240 mg/100 ml or more: Secondary | ICD-10-CM | POA: Diagnosis present

## 2024-01-07 DIAGNOSIS — F17210 Nicotine dependence, cigarettes, uncomplicated: Secondary | ICD-10-CM | POA: Diagnosis present

## 2024-01-07 DIAGNOSIS — F1010 Alcohol abuse, uncomplicated: Secondary | ICD-10-CM | POA: Diagnosis present

## 2024-01-07 DIAGNOSIS — R45851 Suicidal ideations: Secondary | ICD-10-CM | POA: Diagnosis present

## 2024-01-07 DIAGNOSIS — Z833 Family history of diabetes mellitus: Secondary | ICD-10-CM

## 2024-01-07 LAB — URINALYSIS, ROUTINE W REFLEX MICROSCOPIC
Bacteria, UA: NONE SEEN
Bilirubin Urine: NEGATIVE
Glucose, UA: NEGATIVE mg/dL
Ketones, ur: NEGATIVE mg/dL
Leukocytes,Ua: NEGATIVE
Nitrite: NEGATIVE
Protein, ur: 30 mg/dL — AB
RBC / HPF: 0 RBC/hpf (ref 0–5)
Specific Gravity, Urine: 1.019 (ref 1.005–1.030)
pH: 7 (ref 5.0–8.0)

## 2024-01-07 LAB — CBC
HCT: 44.2 % (ref 39.0–52.0)
Hemoglobin: 15.2 g/dL (ref 13.0–17.0)
MCH: 34.2 pg — ABNORMAL HIGH (ref 26.0–34.0)
MCHC: 34.4 g/dL (ref 30.0–36.0)
MCV: 99.3 fL (ref 80.0–100.0)
Platelets: 139 10*3/uL — ABNORMAL LOW (ref 150–400)
RBC: 4.45 MIL/uL (ref 4.22–5.81)
RDW: 11.7 % (ref 11.5–15.5)
WBC: 10.5 10*3/uL (ref 4.0–10.5)
nRBC: 0 % (ref 0.0–0.2)

## 2024-01-07 LAB — RESP PANEL BY RT-PCR (RSV, FLU A&B, COVID)  RVPGX2
Influenza A by PCR: NEGATIVE
Influenza B by PCR: NEGATIVE
Resp Syncytial Virus by PCR: NEGATIVE
SARS Coronavirus 2 by RT PCR: NEGATIVE

## 2024-01-07 MED ORDER — MAGNESIUM HYDROXIDE 400 MG/5ML PO SUSP: 30.0000 mL | Freq: Every day | ORAL | Status: DC | PRN

## 2024-01-07 MED ORDER — MIRTAZAPINE 15 MG PO TABS: 7.5000 mg | ORAL_TABLET | Freq: Every day | ORAL | Status: DC

## 2024-01-07 MED ORDER — TRAZODONE HCL 50 MG PO TABS
50.0000 mg | ORAL_TABLET | Freq: Every evening | ORAL | Status: DC | PRN
  Administered 2024-01-08 (×2): 50 mg via ORAL
  Filled 2024-01-07 (×2): qty 1

## 2024-01-07 MED ORDER — ACETAMINOPHEN 325 MG PO TABS
650.0000 mg | ORAL_TABLET | Freq: Four times a day (QID) | ORAL | Status: DC | PRN
  Administered 2024-01-08 – 2024-01-09 (×2): 650 mg via ORAL
  Filled 2024-01-07 (×2): qty 2

## 2024-01-07 MED ORDER — HYDROXYZINE HCL 25 MG PO TABS: 50.0000 mg | ORAL_TABLET | Freq: Once | ORAL | Status: DC

## 2024-01-07 MED ORDER — ALUM & MAG HYDROXIDE-SIMETH 200-200-20 MG/5ML PO SUSP: 30.0000 mL | ORAL | Status: DC | PRN

## 2024-01-07 MED ORDER — NICOTINE 14 MG/24HR TD PT24
14.0000 mg | MEDICATED_PATCH | Freq: Every day | TRANSDERMAL | Status: DC
  Filled 2024-01-07 (×2): qty 1

## 2024-01-07 MED ORDER — AMLODIPINE BESYLATE 5 MG PO TABS
2.5000 mg | ORAL_TABLET | Freq: Every day | ORAL | Status: DC
  Administered 2024-01-07: 2.5 mg via ORAL
  Filled 2024-01-07: qty 1

## 2024-01-07 NOTE — Consult Note (Signed)
 [facility] Psychiatric Consult Initial  Patient Name: .[name].  MRN: [id number]  DOB: 09/26/1961  Consult Order details:  Orders (From admission, onward)     Start     Ordered   01/06/24 1801  IP CONSULT TO PSYCHIATRY       Ordering Provider: Willy Eddy, MD  Provider:  (Not yet assigned)  Question Answer Comment  Place call to: ed   Reason for Consult Admit      01/06/24 1800   01/06/24 1801  CONSULT TO CALL ACT TEAM       Ordering Provider: Willy Eddy, MD  Provider:  (Not yet assigned)  Question:  Reason for Consult?  Answer:  ed   01/06/24 1800             Mode of Visit: Tele-visit Virtual Statement:TELE PSYCHIATRY ATTESTATION & CONSENT As the provider for this telehealth consult, I attest that I verified the patient's identity using two separate identifiers, introduced myself to the patient, provided my credentials, disclosed my location, and performed this encounter via a HIPAA-compliant, real-time, face-to-face, two-way, interactive audio and video platform and with the full consent and agreement of the patient (or guardian as applicable.) Patient physical location: [facility]. Telehealth provider physical location: home office in state of [place].   Video start time: 0945 Video end time: 1005    Psychiatry Consult Evaluation  Service Date: January 07, 2024 LOS:  LOS: 0 days  Chief Complaint depression  Primary Psychiatric Diagnoses  Substance induced mood disorder 2.  MDD 3.  Alcohol abuse, moderate  Assessment  Naseem Varden. is a 63 y.o. male admitted: Presented to the EDfor 01/06/2024  5:32 PM for alcohol intoxiation and suicidal ideations. He carries the psychiatric diagnoses of MDD.  His current presentation of irritability, depression is most consistent with substance induced mood disorder. He meets criteria for inpatient based on moderate alcohol abuse and passive SI.  Current outpatient psychotropic medications include none. On initial examination,  patient is cooperative, endorses depression. Please see plan below for detailed recommendations.   Diagnoses:  Active Hospital problems: Principal Problem:   Substance induced mood disorder (HCC) Active Problems:   MDD (major depressive disorder)    Plan   ## Psychiatric Medication Recommendations:  - Start Remeron 7.5 mg at bedtime  - CIWA proctool with Ativan in place  ## Medical Decision Making Capacity: Not specifically addressed in this encounter   ## Disposition:-- We recommend inpatient psychiatric hospitalization after medical hospitalization. Patient has been involuntarily committed on 01/07/24.   ## Behavioral / Environmental: - No specific recommendations at this time.     ## Safety and Observation Level:  - Based on my clinical evaluation, I estimate the patient to be at low risk of self harm in the current setting. - At this time, we recommend  routine. This decision is based on my review of the chart including patient's history and current presentation, interview of the patient, mental status examination, and consideration of suicide risk including evaluating suicidal ideation, plan, intent, suicidal or self-harm behaviors, risk factors, and protective factors. This judgment is based on our ability to directly address suicide risk, implement suicide prevention strategies, and develop a safety plan while the patient is in the clinical setting. Please contact our team if there is a concern that risk level has changed.  CSSR Risk Category:C-SSRS RISK CATEGORY: High Risk  Suicide Risk Assessment: Patient has following modifiable risk factors for suicide: untreated depression and social isolation, which  we are addressing by inpatient admission. Patient has following non-modifiable or demographic risk factors for suicide: male gender Patient has the following protective factors against suicide: Cultural, spiritual, or religious beliefs that discourage suicide  Thank you for  this consult request. Recommendations have been communicated to the primary team.  We will recommend inpatient admission at this time.   Eligha Bridegroom, NP       History of Present Illness  Relevant Aspects of Hospital ED Course:  Tj Kitchings. is a 63 y.o. male with a history of alcohol abuse as well as suicidal ideation having been admitted to inpatient psychiatry back in October presents to the ER for evaluation of thoughts of harming himself.  He keeps on repeating "I want to go home.  "Patient somewhat agitated appearing.  States he did not want to be brought here.  Does admit to drinking wine today.  There is report of a dizzy spell but patient denies hitting his head.  No headache.  No numbness or tingling.   Patient Report:  Patient seen at [facility] emergency department for psychiatric assessment.  Patient originally presented intoxicated, BAL of 298 upon arrival.  Patient was irritable, agitated, and reporting suicidal ideations.  This morning patient stated he does not remember last night.  I informed him that he was expressing suicidal ideations in which he states he is not feeling that way today.  He denies current suicidal ideations.  Denies history of suicide attempts.  Denies HI.  Denies AVH.  Patient does report prior history of MDD and GAD.  He does not take any scheduled psychotropics, nor does he see a psychiatrist or therapist at this time.  Patient does report struggling with depression, endorsing isolation, anhedonia, impaired sleep and appetite, low self-esteem.  Most of his family is deceased, he does not feel he has any supportive friends or family and does endorse feelings of loneliness.  He does endorse drinking makes his depression worse.  He does not feel suicidal when he is sober, but does mention " I guess I do think about suicide when I am drunk."  He denies weapons or firearms in the home.  Patient states he does not drink daily but every other day.  He does  not drink beer or liquor but a type of orange wine.  He states " I guess I drink the whole bottle yesterday I am not sure how much I drank."  He endorses occasional marijuana use, denies any other illicit substances.  Patient is receptive to wanting treatment.  He does not think he would be safe if he were discharged home today.  I did explain he is currently under IVC which we will uphold.  Explained recommendation for inpatient treatment which he was accepting of.  We did discuss medications to assist with depression, will start mirtazapine 7.5 mg nightly to assist with depressive symptoms as well as insomnia.  Patient is agreeable.  Please continue IVC at this time.  Will recommend inpatient psychiatric treatment.   Review of Systems  Psychiatric/Behavioral:  Positive for depression, substance abuse and suicidal ideas. The patient has insomnia.      Psychiatric and Social History  Psychiatric History:  Information collected from patient, chart  Prev Dx/Sx: MDD, GAD Current Psych Provider: denies current Home Meds (current): none Previous Med Trials: xanax Therapy: denies  Prior Psych Hospitalization: yes  Prior Self Harm: denies Prior Violence: denies  Family Psych History: denies Family Hx suicide: denies  Social  History:  Developmental Hx: wdl Legal Hx: denies Living Situation: lives alone Spiritual Hx: yes Access to weapons/lethal means: denies   Substance History Alcohol: yes, every other day  Type of alcohol wine Last Drink yesterday - at least 1 bottle History of alcohol withdrawal seizures denies History of DT's denies Tobacco: denies Illicit drugs: thc Prescription drug abuse: denies Rehab hx: denies  Exam Findings  Physical Exam:  Vital Signs:  Temp:  [98.2 F (36.8 C)-98.4 F (36.9 C)] 98.2 F (36.8 C) (04/09 1934) Pulse Rate:  [68-86] 68 (04/10 0008) Resp:  [18] 18 (04/09 1934) BP: (100-123)/(69-72) 123/70 (04/10 0008) SpO2:  [97 %-99 %] 97 %  (04/09 1934) Weight:  [73.5 kg] 73.5 kg (04/09 1640) Blood pressure 123/70, pulse 68, temperature 98.2 F (36.8 C), temperature source Oral, resp. rate 18, height 5\' 7"  (1.702 m), weight 73.5 kg, SpO2 97%. Body mass index is 25.37 kg/m.  Physical Exam Vitals and nursing note reviewed.  Neurological:     Mental Status: He is alert and oriented to person, place, and time.     Mental Status Exam: General Appearance: Fairly Groomed  Orientation:  Full (Time, Place, and Person)  Memory:  Immediate;   Fair Recent;   Fair  Concentration:  Concentration: Fair  Recall:  Fair  Attention  Fair  Eye Contact:  Good  Speech:  Clear and Coherent  Language:  Fair  Volume:  Normal  Mood: irritable  Affect:  Congruent and Flat  Thought Process:  Goal Directed  Thought Content:  WDL  Suicidal Thoughts:  Yes.  without intent/plan  Homicidal Thoughts:  No  Judgement:  Fair  Insight:  Lacking  Psychomotor Activity:  Normal  Akathisia:  No  Fund of Knowledge:  Fair      Assets:  Communication Skills Desire for Improvement Housing Physical Health Resilience  Cognition:  WNL  ADL's:  Intact  AIMS (if indicated):        Other History   These have been pulled in through the EMR, reviewed, and updated if appropriate.  Family History:  The patient's family history includes COPD in his sister; Cancer in his paternal grandmother; Colon cancer in his father and sister; Diabetes in his father; Heart disease in his paternal grandfather; Ovarian cancer in his sister; Pancreatic cancer in his mother; Seizures in his sister; Stroke in his paternal grandfather.  Medical History: Past Medical History:  Diagnosis Date   Alcohol intoxication (HCC)    Anticoagulant long-term use    lifetime use, coumadin therapy, then Xarelto   Anxiety    Arthritis    Cancer (HCC)    renal   Carotid atherosclerosis    <50% left and right   Clotting disorder (HCC)    COPD (chronic obstructive pulmonary  disease) (HCC)    Depression    Elevated liver enzymes 11/2013   Family history of cancer    Fatty liver    H/O drug abuse (HCC)    H/O ETOH abuse    Hepatitis    History of traumatic brain injury    Hypertension    Lymphadenopathy    Obesity    Personal history of DVT (deep vein thrombosis) 06/2013   Pre-diabetes    Primary osteoarthritis of left knee    Pulmonary embolism and infarction (HCC) 06/2013   Pulmonary embolism and infarction (HCC) 11/2013   Pulmonary embolus with infarction [facility])    Renal cell carcinoma (HCC)    Renal mass, left 06/2013   with  ureteral obstruction   Stroke (HCC)    Subarachnoid hemorrhage following injury (HCC)    Thrombocythemia    Thrombocytopenia (HCC)    Tobacco abuse     Surgical History: Past Surgical History:  Procedure Laterality Date   ANKLE FRACTURE SURGERY Right 12/2012   ANKLE SURGERY     EXCISION METACARPAL MASS Right 09/18/2016   Procedure: EXCISION METACARPAL MASS;  Surgeon: Kennedy Bucker, MD;  Location: [facility] ORS;  Service: Orthopedics;  Laterality: Right;  5th digit    HERNIA REPAIR     Umbilical Hernia   ORBITAL FRACTURE SURGERY Left    ROBOTIC ASSITED PARTIAL NEPHRECTOMY Left Oct 2014     Medications:   Current Facility-Administered Medications:    haloperidol lactate (HALDOL) injection 5 mg, 5 mg, Intramuscular, Q6H PRN, Willy Eddy, MD, 5 mg at 01/06/24 1820   LORazepam (ATIVAN) injection 0-4 mg, 0-4 mg, Intravenous, Q6H **OR** LORazepam (ATIVAN) tablet 0-4 mg, 0-4 mg, Oral, Q6H, Willy Eddy, MD   Melene Muller ON 01/09/2024] LORazepam (ATIVAN) injection 0-4 mg, 0-4 mg, Intravenous, Q12H **OR** [START ON 01/09/2024] LORazepam (ATIVAN) tablet 0-4 mg, 0-4 mg, Oral, Q12H, Willy Eddy, MD   mirtazapine (REMERON) tablet 7.5 mg, 7.5 mg, Oral, QHS, Linville Decarolis, Glynda Jaeger, NP   thiamine (VITAMIN B1) tablet 100 mg, 100 mg, Oral, Daily **OR** thiamine (VITAMIN B1) injection 100 mg, 100 mg, Intravenous, Daily, Willy Eddy, MD No current outpatient medications on file.  Allergies: No Known Allergies  Eligha Bridegroom, NP

## 2024-01-07 NOTE — ED Notes (Signed)
 Breakfast tray and beverage provided by ED tech.

## 2024-01-07 NOTE — ED Provider Notes (Signed)
 Psych treat advising geropsychiatry unit is evaluating.  They have requested a repeat CBC, and a COVID test.  I have ordered these.  Additionally given the leukocytosis I have ordered urinalysis to evaluate for occult UTI.     Sharyn Creamer, MD 01/07/24 1113
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
## 2024-01-07 NOTE — ED Notes (Signed)
 Lunch tray and beverage provided by this nurse.

## 2024-01-07 NOTE — Progress Notes (Signed)
   01/07/24 1930  Psych Admission Type (Psych Patients Only)  Admission Status Involuntary  Psychosocial Assessment  Patient Complaints None  Eye Contact Fair  Facial Expression Anxious  Affect Appropriate to circumstance  Speech Logical/coherent  Interaction Assertive  Motor Activity Unsteady (uses walker)  Appearance/Hygiene Disheveled  Behavior Characteristics Cooperative;Anxious  Mood Anxious;Pleasant  Thought Process  Coherency WDL  Content WDL  Delusions None reported or observed  Perception WDL  Hallucination None reported or observed  Judgment Impaired  Confusion None  Danger to Self  Current suicidal ideation? Denies  Danger to Others  Danger to Others None reported or observed   Progress note   D: Pt seen in his room. Pt denies SI, HI, AVH. Pt rates pain  0/10. Pt rates anxiety  0/10 and depression  0/10. Pt was asking about going home. States he doesn't know what IVC means. Explained the IVC process and answered all questions. Pt says he has been admitted to this unit before. States he has a place to live and can take care of himself. Denies withdrawal symptoms and history of withdrawal symptoms. Came out for snack and group. Pt using walker because he has a frozen right shoulder. "It was dislocated but it is back in place but I have limited movement." No other concerns noted at this time.  A: Pt provided support and encouragement. Pt given scheduled medication as prescribed. PRNs as appropriate. Q15 min checks for safety.   R: Pt safe on the unit. Will continue to monitor.

## 2024-01-07 NOTE — BH Assessment (Signed)
 Comprehensive Clinical Assessment (CCA) Note  01/07/2024 [facility] [id number]  Chief Complaint:  Chief Complaint  Patient presents with   Dizziness   Visit Diagnosis: Major Depressive Disorders   Randall Harvey is a 63 year old male who present to the ER due to voicing SI and intoxicated ([id number]), voicing SI. This morning he continue to voice SI and symptoms of depression. Patient lives alone and shared he doesn't have a support system. Patient admits to alcohol use and denies the use of any other mind-altering substances. He denies HI and AV/H.  CCA Screening, Triage and Referral (STR)  Patient Reported Information How did you hear about [place]? Family/Friend  What Is the Reason for Your Visit/Call Today? Patient to the ER intoxicated, voicing SI.  How Long Has This Been Causing You Problems? 1 wk - 1 month  What Do You Feel Would Help You the Most Today? Treatment for Depression or other mood problem; Alcohol or Drug Use Treatment   Have You Recently Had Any Thoughts About Hurting Yourself? Yes  Are You Planning to Commit Suicide/Harm Yourself At This time? Yes   Flowsheet Row ED from 01/06/2024 in [facility] Emergency Department at [facility] ED from 12/29/2023 in [facility] Emergency Department at [facility] ED from 12/16/2023 in [facility] Emergency Department at [facility]  C-SSRS RISK CATEGORY High Risk No Risk No Risk       Have you Recently Had Thoughts About Hurting Someone [name]? No  Are You Planning to Harm Someone at This Time? No  Explanation: No data recorded  Have You Used Any Alcohol or Drugs in the Past 24 Hours? Yes  How Long Ago Did You Use Drugs or Alcohol? Alcohol  What Did You Use and How Much? No data recorded  Do You Currently Have a Therapist/Psychiatrist? No  Name of Therapist/Psychiatrist:    Have You Been Recently Discharged From Any Office Practice or Programs? No  Explanation of Discharge From Practice/Program: No  data recorded    CCA Screening Triage Referral Assessment Type of Contact: Face-to-Face  Telemedicine Service Delivery:   Is this Initial or Reassessment?   Date Telepsych consult ordered in [facility]:    Time Telepsych consult ordered in [facility]:    Location of Assessment: [facility] ED  Provider Location: [facility] ED   Collateral Involvement: No data recorded  Does Patient Have a Court Appointed Legal Guardian? No  Legal Guardian Contact Information: No data recorded Copy of Legal Guardianship Form: No data recorded Legal Guardian Notified of Arrival: No data recorded Legal Guardian Notified of Pending Discharge: No data recorded If Minor and Not Living with Parent(s), Who has Custody? No data recorded Is CPS involved or ever been involved? Never  Is APS involved or ever been involved? Never   Patient Determined To Be At Risk for Harm To Self or Others Based on Review of Patient Reported Information or Presenting Complaint? Yes, for Self-Harm  Method: No data recorded Availability of Means: No data recorded Intent: No data recorded Notification Required: No data recorded Additional Information for Danger to Others Potential: No data recorded Additional Comments for Danger to Others Potential: No data recorded Are There Guns or Other Weapons in Your Home? No data recorded Types of Guns/Weapons: No data recorded Are These Weapons Safely Secured?                            No data recorded Who Could Verify You Are  Able To Have These Secured: No data recorded Do You Have any Outstanding Charges, Pending Court Dates, Parole/Probation? No data recorded Contacted To Inform of Risk of Harm To Self or Others: No data recorded   Does Patient Present under Involuntary Commitment? Yes    [place] of Residence: [facility]   Patient Currently Receiving the Following Services: Not Receiving Services   Determination of Need: Urgent (48 hours)   Options For Referral: ED Referral; Inpatient  Hospitalization     CCA Biopsychosocial Patient Reported Schizophrenia/Schizoaffective Diagnosis in Past: No   Strengths: Some insight, seeking help and polite   Mental Health Symptoms Depression:  Change in energy/activity; Difficulty Concentrating; Hopelessness   Duration of Depressive symptoms: Duration of Depressive Symptoms: Greater than two weeks   Mania:  None   Anxiety:   N/A   Psychosis:  None   Duration of Psychotic symptoms:    Trauma:  N/A   Obsessions:  N/A   Compulsions:  N/A   Inattention:  N/A   Hyperactivity/Impulsivity:  N/A   Oppositional/Defiant Behaviors:  N/A   Emotional Irregularity:  N/A   Other Mood/Personality Symptoms:  No data recorded   Mental Status Exam Appearance and self-care  Stature:  Average   Weight:  Average weight   Clothing:  Neat/clean; Age-appropriate   Grooming:  Normal   Cosmetic use:  None   Posture/gait:  Normal   Motor activity:  -- (UTA, patient laying in the bed.)   Sensorium  Attention:  Normal   Concentration:  Normal   Orientation:  X5   Recall/memory:  Normal   Affect and Mood  Affect:  Appropriate; Full Range   Mood:  Depressed   Relating  Eye contact:  Normal   Facial expression:  Depressed; Responsive   Attitude toward examiner:  Cooperative   Thought and Language  Speech flow: Clear and Coherent   Thought content:  Appropriate to Mood and Circumstances   Preoccupation:  None   Hallucinations:  None   Organization:  Intact; Logical   Company secretary of Knowledge:  Fair   Intelligence:  Average   Abstraction:  Normal   Judgement:  Normal   Reality Testing:  Adequate   Insight:  Fair   Decision Making:  Impulsive   Social Functioning  Social Maturity:  Irresponsible   Social Judgement:  Heedless; "Street Smart"   Stress  Stressors:  Other (Comment)   Coping Ability:  Overwhelmed   Skill Deficits:  None   Supports:  Support needed      Religion: Religion/Spirituality Are You A Religious Person?: No  Leisure/Recreation: Leisure / Recreation Do You Have Hobbies?: No  Exercise/Diet: Exercise/Diet Do You Exercise?: No Have You Gained or Lost A Significant Amount of Weight in the Past Six Months?: No Do You Follow a Special Diet?: No Do You Have Any Trouble Sleeping?: No   CCA Employment/Education Employment/Work Situation: Employment / Work Situation Employment Situation: Unemployed Patient's Job has Been Impacted by Current Illness: No  Education: Education Is Patient Currently Attending School?: No Did You Have An Individualized Education Program (IIEP): No Did You Have Any Difficulty At [employer]?: No Patient's Education Has Been Impacted by Current Illness: No   CCA Family/Childhood History Family and Relationship History: Family history Marital status: Single Does patient have children?: No  Childhood History:  Childhood History By whom was/is the patient raised?: Mother Did patient suffer any verbal/emotional/physical/sexual abuse as a child?: No Did patient suffer from severe childhood neglect?: No Has  patient ever been sexually abused/assaulted/raped as an adolescent or adult?: No Was the patient ever a victim of a crime or a disaster?: No Witnessed domestic violence?: No Has patient been affected by domestic violence as an adult?: No       CCA Substance Use Alcohol/Drug Use: Alcohol / Drug Use Pain Medications: See MAR Prescriptions: See MAR Over the Counter: See MAR History of alcohol / drug use?: Yes Longest period of sobriety (when/how long): Unable to quantify Substance #1 Name of Substance 1: Alcohol 1 - Frequency: "Every other day." 1 - Last Use / Amount: 01/06/2024   ASAM's:  Six Dimensions of Multidimensional Assessment  Dimension 1:  Acute Intoxication and/or Withdrawal Potential:      Dimension 2:  Biomedical Conditions and Complications:      Dimension 3:   Emotional, Behavioral, or Cognitive Conditions and Complications:     Dimension 4:  Readiness to Change:     Dimension 5:  Relapse, Continued use, or Continued Problem Potential:     Dimension 6:  Recovery/Living Environment:     ASAM Severity Score:    ASAM Recommended Level of Treatment:     Substance use Disorder (SUD)    Recommendations for Services/Supports/Treatments:    Disposition Recommendation per psychiatric provider: Inpatient Treatment   DSM5 Diagnoses: Patient Active Problem List   Diagnosis Date Noted   Fall at home, initial encounter 12/30/2023   COPD exacerbation (HCC) 12/29/2023   Polysubstance abuse (HCC) 12/29/2023   Alcohol abuse 12/29/2023   Elevated lactic acid level 12/29/2023   Pleuritic chest pain 12/29/2023   Hypokalemia 12/29/2023   Stroke (HCC) 12/29/2023   Depression with anxiety 12/29/2023   Lung nodule 12/29/2023   Mediastinal adenopathy 12/29/2023   Hypotension 12/29/2023   Headache 07/17/2023   MDD (major depressive disorder), recurrent severe, without psychosis (HCC) 07/16/2023   Cocaine abuse (HCC) 07/16/2023   Suicidal ideation 07/15/2023   History of traumatic brain injury 07/15/2023   Substance induced mood disorder (HCC) 07/15/2023   MDD (major depressive disorder) 07/15/2023   Alcohol intoxication (HCC) 04/06/2022   Alcohol-induced mood disorder (HCC) 01/18/2022   Traumatic brain injury with loss of consciousness (HCC) 08/20/2021   Generalized lymphadenopathy 08/20/2021   Malnutrition of moderate degree 06/20/2021   Skull fracture (HCC) 06/15/2021   Type 2 diabetes mellitus with obesity (HCC) 10/05/2018   Hypertriglyceridemia 09/27/2018   DVT of lower limb, acute (HCC) 01/05/2018   Marijuana use 08/08/2016   Anxiety disorder 09/01/2015   Obesity 09/01/2015   Tobacco abuse 09/01/2015   Fatty liver 09/01/2015   Ganglion cyst of flexor tendon sheath of finger 08/28/2015   Knee pain, bilateral 08/28/2015   Pulmonary embolus  (HCC) 08/28/2015   Erectile dysfunction 08/28/2015   Positive 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 03/21/2015   Renal cell carcinoma (HCC) 02/22/2015   COPD, mild (HCC) 06/06/2014     Referrals to Alternative Service(s): Referred to Alternative Service(s):   Place:   Date:   Time:    Referred to Alternative Service(s):   Place:   Date:   Time:    Referred to Alternative Service(s):   Place:   Date:   Time:    Referred to Alternative Service(s):   Place:   Date:   Time:     Lilyan Gilford MS, LCAS, [facility], [facility] Therapeutic Triage Specialist 01/07/2024 10:16 AM

## 2024-01-07 NOTE — ED Provider Notes (Signed)
 CBC has normalized.  COVID influenza negative   Sharyn Creamer, MD 01/07/24 1949

## 2024-01-07 NOTE — ED Notes (Signed)
 Pt repeatedly out of doorway asking for this nurse. Pt states he has "cabin fever" and is ready to go home. Pt reassured and updated on plan of care. Verbalized understanding.

## 2024-01-07 NOTE — ED Notes (Addendum)
 NP via Telepsych cart and Calvin at the bedside for pt evaluation.

## 2024-01-07 NOTE — Plan of Care (Signed)
  Problem: Coping: Goal: Will verbalize feelings Outcome: Progressing   Problem: Self-Concept: Goal: Level of anxiety will decrease Outcome: Progressing

## 2024-01-07 NOTE — ED Notes (Signed)
 Report given to admission RN Darl Pikes

## 2024-01-07 NOTE — Tx Team (Signed)
 Initial Treatment Plan 01/07/2024 6:31 PM Randall Harvey. [id number]    PATIENT STRESSORS: Health problems   Substance abuse     PATIENT STRENGTHS: Printmaker for treatment/growth    PATIENT IDENTIFIED PROBLEMS: Alcohol Abuse  Ineffective Coping Skills                   DISCHARGE CRITERIA:  Improved stabilization in mood, thinking, and/or behavior Motivation to continue treatment in a less acute level of care  PRELIMINARY DISCHARGE PLAN: Attend 12-step recovery group Outpatient therapy  PATIENT/FAMILY INVOLVEMENT: This treatment plan has been presented to and reviewed with the patient, [facility]., and/or family member, .  The patient and family have been given the opportunity to ask questions and make suggestions.  Sharin Mons, RN 01/07/2024, 6:31 PM

## 2024-01-07 NOTE — Group Note (Signed)
 Date:  01/07/2024 Time:  10:22 PM  Group Topic/Focus:  Managing Feelings:   The focus of this group is to identify what feelings patients have difficulty handling and develop a plan to handle them in a healthier way upon discharge.    Participation Level:  Active  Participation Quality:  Appropriate  Affect:  Appropriate  Cognitive:  Appropriate  Insight: Appropriate  Engagement in Group:  Engaged  Modes of Intervention:  Exploration  Additional Comments:    Garry Heater 01/07/2024, 10:22 PM

## 2024-01-07 NOTE — Progress Notes (Signed)
 Randall Harvey, a 63 year old male, presented to the Emergency Department for evaluation following an episode of alcohol intoxication. Upon arrival, the patient was noted to be agitated and required medication for behavioral management.  The psychiatric team attempted an initial evaluation; however, the patient was uncooperative and unable to participate in the assessment. Due to his current state, a comprehensive psychiatric evaluation will be deferred until the patient is more stable and able to engage appropriately, with reassessment planned for the morning.

## 2024-01-07 NOTE — ED Notes (Signed)
Ivc / consult pending 

## 2024-01-08 DIAGNOSIS — F339 Major depressive disorder, recurrent, unspecified: Secondary | ICD-10-CM

## 2024-01-08 MED ORDER — DULOXETINE HCL 30 MG PO CPEP
30.0000 mg | ORAL_CAPSULE | Freq: Every day | ORAL | Status: DC
  Administered 2024-01-09: 30 mg via ORAL
  Filled 2024-01-08: qty 1

## 2024-01-08 MED ORDER — MIRTAZAPINE 15 MG PO TABS
7.5000 mg | ORAL_TABLET | Freq: Every day | ORAL | Status: DC
  Administered 2024-01-08: 7.5 mg via ORAL
  Filled 2024-01-08: qty 0.5

## 2024-01-08 NOTE — Group Note (Signed)
 Date:  01/08/2024 Time:  10:55 AM  Group Topic/Focus:  Crisis Planning:   The purpose of this group is to help patients create a crisis plan for use upon discharge or in the future, as needed.    Participation Level:  Did Not Attend  Ardelle Anton 01/08/2024, 10:55 AM

## 2024-01-08 NOTE — Group Note (Signed)
 Date:  01/09/2024 Time:  12:00 AM  Group Topic/Focus:  Personal Choices and Values:   The focus of this group is to help patients assess and explore the importance of values in their lives, how their values affect their decisions, how they express their values and what opposes their expression.    Participation Level:  Active  Participation Quality:  Appropriate  Affect:  Appropriate  Cognitive:  Appropriate  Insight: Good  Engagement in Group:  Engaged  Modes of Intervention:  Discussion  Additional Comments:    Burt Ek 01/09/2024, 12:00 AM

## 2024-01-08 NOTE — BHH Suicide Risk Assessment (Signed)
 [facility] Admission Suicide Risk Assessment   Nursing information obtained from:  Patient Demographic factors:  Male, Age 65 or older, Caucasian, Low socioeconomic status, Living alone, Divorced or widowed Current Mental Status:  NA Loss Factors:  Decline in physical health, Financial problems / change in socioeconomic status Historical Factors:  NA Risk Reduction Factors:  Positive social support  Total Time spent with patient: 30 minutes Principal Problem: MDD (major depressive disorder), recurrent episode (HCC) Diagnosis:  Principal Problem:   MDD (major depressive disorder), recurrent episode (HCC)  Subjective Data: Randall Harvey. is a 63 y.o. male admitted: Presented to the EDfor 01/06/2024  5:32 PM for alcohol intoxiation and suicidal ideations. He carries the psychiatric diagnoses of MDD and alcohol use. His current presentation of irritability, depression is most consistent with substance induced mood disorder. He meets criteria for inpatient based on moderate alcohol abuse and passive SI.  Continued Clinical Symptoms:  Alcohol Use Disorder Identification Test Final Score (AUDIT): 6 The "Alcohol Use Disorders Identification Test", Guidelines for Use in Primary Care, Second Edition.  World Science writer [facility]). Score between 0-7:  no or low risk or alcohol related problems. Score between 8-15:  moderate risk of alcohol related problems. Score between 16-19:  high risk of alcohol related problems. Score 20 or above:  warrants further diagnostic evaluation for alcohol dependence and treatment.   CLINICAL FACTORS:   Depression:   Comorbid alcohol abuse/dependence Alcohol/Substance Abuse/Dependencies   Musculoskeletal: Strength & Muscle Tone: within normal limits Gait & Station: normal Patient leans: N/A  Psychiatric Specialty Exam:  Presentation  General Appearance:  Appropriate for Environment; Casual  Eye Contact: Fair  Speech: Clear and Coherent  Speech  Volume: Normal  Handedness: Right   Mood and Affect  Mood: Dysphoric  Affect: Appropriate   Thought Process  Thought Processes: Coherent  Descriptions of Associations:Intact  Orientation:Full (Time, Place and Person)  Thought Content:Logical  History of Schizophrenia/Schizoaffective disorder:No  Duration of Psychotic Symptoms:N/A  Hallucinations:Hallucinations: None  Ideas of Reference:None  Suicidal Thoughts:Suicidal Thoughts: No  Homicidal Thoughts:Homicidal Thoughts: No   Sensorium  Memory: Immediate Fair; Recent Fair; Remote Fair  Judgment: Impaired  Insight: Poor   Executive Functions  Concentration: Fair  Attention Span: Fair  Recall: [employer] of Knowledge: Fair  Language: Fair   Psychomotor Activity  Psychomotor Activity: Psychomotor Activity: Normal   Assets  Assets: Communication Skills; Desire for Improvement; Resilience; Social Support   Sleep  Sleep: Sleep: Fair    Physical Exam: Physical Exam ROS Blood pressure 125/81, pulse 80, temperature 98.4 F (36.9 C), resp. rate 16, height 5\' 7"  (1.702 m), weight 72.1 kg, SpO2 99%. Body mass index is 24.9 kg/m.   COGNITIVE FEATURES THAT CONTRIBUTE TO RISK:  None    SUICIDE RISK:   Minimal: No identifiable suicidal ideation.  Patients presenting with no risk factors but with morbid ruminations; may be classified as minimal risk based on the severity of the depressive symptoms  PLAN OF CARE: Patient is admitted to [facility] psych unit with Q15 min safety monitoring. Multidisciplinary team approach is offered. Medication management; group/milieu therapy is offered.   I certify that inpatient services furnished can reasonably be expected to improve the patient's condition.   Verner Chol, MD 01/08/2024, 3:01 PM

## 2024-01-08 NOTE — Progress Notes (Signed)
 Pt given PRN medication to help him sleep. "I keep waking up." Will continue to monitor.

## 2024-01-08 NOTE — Group Note (Signed)
 Recreation Therapy Group Note   Group Topic:Leisure Education  Group Date: 01/08/2024 Start Time: 1400 End Time: 1450 Facilitators: Rosina Lowenstein, LRT, CTRS Location:  Dayroom  Group Description: Bingo. LRT and patients played multiple games of Bingo with music playing in the background. LRT and pts discussed how this could be a leisure interest and the importance of doing things they enjoy post-discharge. Pts won stress balls as Chief Financial Officer.    Goal Area(s) Addressed: Patient will identify leisure interests.  Patient will practice healthy decision making. Patient will engage in recreation activity.  Patient will increase communication.   Affect/Mood: Appropriate   Participation Level: Active and Engaged   Participation Quality: Independent   Behavior: Calm and Cooperative   Speech/Thought Process: Coherent   Insight: Fair   Judgement: Fair    Modes of Intervention: Cooperative Play and Music   Patient Response to Interventions:  Attentive, Engaged, and Receptive   Education Outcome:  In group clarification offered    Clinical Observations/Individualized Feedback: Randall Harvey was active in their participation of session activities and group discussion. Pt interacted well with LRT and peers duration of session.    Plan: Continue to engage patient in RT group sessions 2-3x/week.   Rosina Lowenstein, LRT, CTRS 01/08/2024 4:29 PM

## 2024-01-08 NOTE — Progress Notes (Signed)
   01/08/24 1300  Psych Admission Type (Psych Patients Only)  Admission Status Involuntary  Psychosocial Assessment  Patient Complaints Anxiety  Eye Contact Fair  Facial Expression Other (Comment) (appropriate)  Affect Appropriate to circumstance  Speech Logical/coherent  Interaction Assertive  Motor Activity Unsteady  Appearance/Hygiene Disheveled  Behavior Characteristics Anxious  Mood Anxious  Thought Process  Coherency WDL  Content WDL  Delusions None reported or observed  Perception WDL  Hallucination None reported or observed  Judgment Impaired  Confusion None  Danger to Self  Current suicidal ideation? Denies  Danger to Others  Danger to Others None reported or observed

## 2024-01-08 NOTE — BHH Suicide Risk Assessment (Signed)
 [facility] INPATIENT:  Family/Significant Other Suicide Prevention Education  Suicide Prevention Education:  Patient Refusal for Family/Significant Other Suicide Prevention Education: The patient Randall Harvey. has refused to provide written consent for family/significant other to be provided Family/Significant Other Suicide Prevention Education during admission and/or prior to discharge.  Physician notified.   SPE completed with pt, as pt refused to consent to family contact. SPI pamphlet provided to pt and pt was encouraged to share information with support network, ask questions, and talk about any concerns relating to SPE. Pt denies access to guns/firearms and verbalized understanding of information provided. Mobile Crisis information also provided to pt.    Harden Mo 01/08/2024, 3:47 PM

## 2024-01-08 NOTE — BH IP Treatment Plan (Signed)
 Interdisciplinary Treatment and Diagnostic Plan Update  01/08/2024 Time of Session: 10:15AM Randall Harvey. MRN: [id number]  Principal Diagnosis: MDD (major depressive disorder), recurrent episode (HCC)  Secondary Diagnoses: Principal Problem:   MDD (major depressive disorder), recurrent episode (HCC)   Current Medications:  Current Facility-Administered Medications  Medication Dose Route Frequency Provider Last Rate Last Admin   acetaminophen (TYLENOL) tablet 650 mg  650 mg Oral Q6H PRN Rayburn Go, Veronique M, NP       alum & mag hydroxide-simeth (MAALOX/MYLANTA) 200-200-20 MG/5ML suspension 30 mL  30 mL Oral Q4H PRN Rayburn Go, Veronique M, NP       magnesium hydroxide (MILK OF MAGNESIA) suspension 30 mL  30 mL Oral Daily PRN Rayburn Go, Veronique M, NP       nicotine (NICODERM CQ - dosed in mg/24 hours) patch 14 mg  14 mg Transdermal Daily Verner Chol, MD       traZODone (DESYREL) tablet 50 mg  50 mg Oral QHS PRN Marlou Sa, NP   50 mg at 01/08/24 0123   PTA Medications: No medications prior to admission.    Patient Stressors: Health problems   Substance abuse    Patient Strengths: Printmaker for treatment/growth   Treatment Modalities: Medication Management, Group therapy, Case management,  1 to 1 session with clinician, Psychoeducation, Recreational therapy.   Physician Treatment Plan for Primary Diagnosis: MDD (major depressive disorder), recurrent episode (HCC) Long Term Goal(s):     Short Term Goals:    Medication Management: Evaluate patient's response, side effects, and tolerance of medication regimen.  Therapeutic Interventions: 1 to 1 sessions, Unit Group sessions and Medication administration.  Evaluation of Outcomes: Progressing  Physician Treatment Plan for Secondary Diagnosis: Principal Problem:   MDD (major depressive disorder), recurrent episode (HCC)  Long Term Goal(s):     Short Term Goals:       Medication  Management: Evaluate patient's response, side effects, and tolerance of medication regimen.  Therapeutic Interventions: 1 to 1 sessions, Unit Group sessions and Medication administration.  Evaluation of Outcomes: Progressing   RN Treatment Plan for Primary Diagnosis: MDD (major depressive disorder), recurrent episode (HCC) Long Term Goal(s): Knowledge of disease and therapeutic regimen to maintain health will improve  Short Term Goals: Ability to demonstrate self-control, Ability to participate in decision making will improve, Ability to verbalize feelings will improve, Ability to disclose and discuss suicidal ideas, Ability to identify and develop effective coping behaviors will improve, and Compliance with prescribed medications will improve  Medication Management: RN will administer medications as ordered by provider, will assess and evaluate patient's response and provide education to patient for prescribed medication. RN will report any adverse and/or side effects to prescribing provider.  Therapeutic Interventions: 1 on 1 counseling sessions, Psychoeducation, Medication administration, Evaluate responses to treatment, Monitor vital signs and CBGs as ordered, Perform/monitor CIWA, COWS, AIMS and Fall Risk screenings as ordered, Perform wound care treatments as ordered.  Evaluation of Outcomes: Progressing   LCSW Treatment Plan for Primary Diagnosis: MDD (major depressive disorder), recurrent episode (HCC) Long Term Goal(s): Safe transition to appropriate next level of care at discharge, Engage patient in therapeutic group addressing interpersonal concerns.  Short Term Goals: Engage patient in aftercare planning with referrals and resources, Increase social support, Increase ability to appropriately verbalize feelings, Increase emotional regulation, Facilitate acceptance of mental health diagnosis and concerns, Facilitate patient progression through stages of change regarding substance use  diagnoses and concerns, Identify triggers associated with mental health/substance abuse issues,  and Increase skills for wellness and recovery  Therapeutic Interventions: Assess for all discharge needs, 1 to 1 time with Social worker, Explore available resources and support systems, Assess for adequacy in community support network, Educate family and significant other(s) on suicide prevention, Complete Psychosocial Assessment, Interpersonal group therapy.  Evaluation of Outcomes: Progressing   Progress in Treatment: Attending groups: Yes. Participating in groups: Yes. Taking medication as prescribed: Yes. Toleration medication: Yes. Family/Significant other contact made: No, will contact:  once permission has been granted Patient understands diagnosis: Yes. Discussing patient identified problems/goals with staff: Yes. Medical problems stabilized or resolved: Yes. Denies suicidal/homicidal ideation: Yes. Issues/concerns per patient self-inventory: No. Other: none  New problem(s) identified: No, Describe:  none  New Short Term/Long Term Goal(s): detox, elimination of symptoms of psychosis, medication management for mood stabilization; elimination of SI thoughts; development of comprehensive mental wellness/sobriety plan.   Patient Goals: "I want better"   Discharge Plan or Barriers: CSW to assist in the development of appropriate discharge plans.   Reason for Continuation of Hospitalization: Anxiety Depression Medication stabilization Suicidal ideation  Estimated Length of Stay:  1-7 days  Last 3 [place] Suicide Severity Risk Score: Flowsheet Row Admission (Current) from 01/07/2024 in [facility] ED from 01/06/2024 in [facility] Emergency Department at [facility] ED from 12/29/2023 in [facility] Department at [facility]  C-SSRS RISK CATEGORY High Risk High Risk No Risk       Last PHQ 2/9 Scores:    09/24/2021   11:36 AM 09/19/2021     1:07 PM 08/20/2021   11:35 AM  Depression screen PHQ 2/9  Decreased Interest 3 0 0  Down, Depressed, Hopeless 3 0 0  PHQ - 2 Score 6 0 0  Altered sleeping 1 0 0  Tired, decreased energy 1 0 0  Change in appetite 0 0 0  Feeling bad or failure about yourself  3 0 0  Trouble concentrating 0 0 0  Moving slowly or fidgety/restless 0 0 0  Suicidal thoughts 1 0 0  PHQ-9 Score 12 0 0  Difficult doing work/chores Extremely dIfficult Not difficult at all Not difficult at all    Scribe for Treatment Team: Harden Mo, LCSW 01/08/2024 11:43 AM
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
## 2024-01-08 NOTE — Group Note (Signed)
 Therapy Group Note  Group Topic: Neurographic Art  Group Date: 01/08/2024 Start Time: 1300 End Time: 1400 Facilitators: Lottie Mussel, OT     Group Description: Group participated with Neurographic art activity, using watercolor paints to facilitate creative expression and meditation/relaxation for each individual.  Incorporated bimanual coordination, mental focus, emotional processing, task/command following and relaxation techniques as appropriate.  Patients engaged socially with therapist and other group participants throughout session. Allowed to ask questions as appropriate, and encouraged to identify ways they could use/share their creations with themselves and others.   Therapeutic Goal(s): Demonstrate ability to independently manipulate utensils required to participate with and complete activity. Demonstrate ability to cognitively focus on task and follow commands necessary for completion. Demonstrate use of art as an outlet for emotional processing and expression. Identify and demonstrate importance of relaxation, neural calming and meditation for improved participation with life groups.   Individual Participation: Pt pleasant and engaged while present for first half of the group. Pt conversed with therapist and other patients with and without prompting. Required minimal cues for technique. Pt completed his art and politely left the group, endorsing appreciation for the opportunity.    Participation Level: Active and Engaged   Participation Quality: Minimal Cues   Behavior: Alert, Appropriate, Calm, and Cooperative   Speech/Thought Process: Coherent, Organized, and Relevant   Affect/Mood: Appropriate   Insight: Good   Judgement: Good   Modes of Intervention: Activity, Clarification, Discussion, Education, Exploration, and Socialization  Patient Response to Interventions:  Attentive, Engaged, Interested , and Receptive   Plan: Continue to engage patient in OT groups 1  - 2x/week.  Arman Filter., MPH, MS, OTR/L ascom [phone] 01/08/24, 4:13 PM

## 2024-01-08 NOTE — Plan of Care (Signed)
   Problem: Education: Goal: Utilization of techniques to improve thought processes will improve Outcome: Progressing Goal: Knowledge of the prescribed therapeutic regimen will improve Outcome: Progressing

## 2024-01-08 NOTE — Plan of Care (Signed)
  Problem: Activity: Goal: Imbalance in normal sleep/wake cycle will improve Outcome: Progressing   Problem: Coping: Goal: Will verbalize feelings Outcome: Progressing   Problem: Self-Concept: Goal: Level of anxiety will decrease Outcome: Progressing   Problem: Safety: Goal: Periods of time without injury will increase Outcome: Progressing
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
## 2024-01-09 DIAGNOSIS — F339 Major depressive disorder, recurrent, unspecified: Secondary | ICD-10-CM | POA: Diagnosis not present

## 2024-01-09 MED ORDER — DULOXETINE HCL 30 MG PO CPEP: 30.0000 mg | ORAL_CAPSULE | Freq: Every day | ORAL | 0 refills | Status: DC

## 2024-01-09 MED ORDER — MIRTAZAPINE 7.5 MG PO TABS: 7.5000 mg | ORAL_TABLET | Freq: Every day | ORAL | 0 refills | Status: DC

## 2024-01-09 MED ORDER — TRAZODONE HCL 50 MG PO TABS: 50.0000 mg | ORAL_TABLET | Freq: Every evening | ORAL | 0 refills | Status: DC | PRN

## 2024-01-09 NOTE — Progress Notes (Signed)

## 2024-01-09 NOTE — Discharge Summary (Signed)
 Physician Discharge Summary Note  Patient:  Randall Harvey. is an 63 y.o., male MRN:  [id number] DOB:  10/16/1960 Patient phone:  [phone] (home)  Patient address:   [address] [place] [address],    Date of Admission:  01/07/2024 Date of Discharge: 01/09/24  Reason for Admission:  Randall Harvey. is a 63 y.o. male admitted: Presented to the EDfor 01/06/2024  5:32 PM for alcohol intoxiation and suicidal ideations. He carries the psychiatric diagnoses of MDD and alcohol use. His current presentation of irritability, depression is most consistent with substance induced mood disorder. He meets criteria for inpatient based on moderate alcohol abuse and passive SI.   Principal Problem: MDD (major depressive disorder), recurrent episode (HCC) Discharge Diagnoses: Principal Problem:   MDD (major depressive disorder), recurrent episode (HCC)   Past Psychiatric History: see h&p  Family Psychiatric  History: see h&p Social History:  Social History   Substance and Sexual Activity  Alcohol Use Yes   Comment: occasionally     Social History   Substance and Sexual Activity  Drug Use Yes   Types: Marijuana   Comment: occ    Social History   Socioeconomic History   Marital status: Divorced    Spouse name: Not on file   Number of children: Not on file   Years of education: Not on file   Highest education level: Not on file  Occupational History   Not on file  Tobacco Use   Smoking status: Every Day    Current packs/day: 0.50    Types: Cigarettes   Smokeless tobacco: Never  Vaping Use   Vaping status: Never Used  Substance and Sexual Activity   Alcohol use: Yes    Comment: occasionally   Drug use: Yes    Types: Marijuana    Comment: occ   Sexual activity: Yes  Other Topics Concern   Not on file  Social History Narrative   ** Merged History Encounter **       Social Drivers of Health   Financial Resource Strain: Not on file  Food Insecurity: Food Insecurity  Present (01/07/2024)   Hunger Vital Sign    Worried About Running Out of Food in the Last Year: Sometimes true    Ran Out of Food in the Last Year: Often true  Transportation Needs: Unmet Transportation Needs (01/07/2024)   PRAPARE - Administrator, Civil Service (Medical): Yes    Lack of Transportation (Non-Medical): Yes  Physical Activity: Not on file  Stress: Not on file  Social Connections: Socially Isolated (01/07/2024)   Social Connection and Isolation Panel [NHANES]    Frequency of Communication with Friends and Family: Never    Frequency of Social Gatherings with Friends and Family: Never    Attends Religious Services: 1 to 4 times per year    Active Member of [employer] or Organizations: No    Attends Engineer, structural: Never    Marital Status: Divorced   Past Medical History:  Past Medical History:  Diagnosis Date   Alcohol intoxication (HCC)    Anticoagulant long-term use    lifetime use, coumadin therapy, then Xarelto   Anxiety    Arthritis    Cancer (HCC)    renal   Carotid atherosclerosis    <50% left and right   Clotting disorder (HCC)    COPD (chronic obstructive pulmonary disease) (HCC)    Depression    Elevated liver enzymes 11/2013   Family  history of cancer    Fatty liver    H/O drug abuse (HCC)    H/O ETOH abuse    Hepatitis    History of traumatic brain injury    Hypertension    Lymphadenopathy    Obesity    Personal history of DVT (deep vein thrombosis) 06/2013   Pre-diabetes    Primary osteoarthritis of left knee    Pulmonary embolism and infarction (HCC) 06/2013   Pulmonary embolism and infarction (HCC) 11/2013   Pulmonary embolus with infarction [facility])    Renal cell carcinoma (HCC)    Renal mass, left 06/2013   with ureteral obstruction   Stroke (HCC)    Subarachnoid hemorrhage following injury (HCC)    Thrombocythemia    Thrombocytopenia (HCC)    Tobacco abuse     Past Surgical History:  Procedure Laterality Date    ANKLE FRACTURE SURGERY Right 12/2012   ANKLE SURGERY     EXCISION METACARPAL MASS Right 09/18/2016   Procedure: EXCISION METACARPAL MASS;  Surgeon: Molli Angelucci, MD;  Location: [facility] ORS;  Service: Orthopedics;  Laterality: Right;  5th digit    HERNIA REPAIR     Umbilical Hernia   ORBITAL FRACTURE SURGERY Left    ROBOTIC ASSITED PARTIAL NEPHRECTOMY Left Oct 2014   Family History:  Family History  Problem Relation Age of Onset   Pancreatic cancer Mother    Colon cancer Father    Diabetes Father    Colon cancer Sister    COPD Sister    Seizures Sister    Ovarian cancer Sister    Cancer Paternal Grandmother        bone   Stroke Paternal Grandfather    Heart disease Paternal Grandfather    Hypertension Neg Hx     Hospital Course:  Randall Harvey. is a 63 y.o. male admitted: Presented to the EDfor 01/06/2024  5:32 PM for alcohol intoxiation and suicidal ideations. He carries the psychiatric diagnoses of MDD and alcohol use. His current presentation of irritability, depression is most consistent with substance induced mood disorder. He meets criteria for inpatient based on moderate alcohol abuse and passive SI.  Since admission patient has been discharged focused stating that he was drunk and does not remember any of the suicidal statements he made.  He reports that he has a home to go to and wants to follow-up as outpatient.  He consistently showed safe behaviors on the unit, participated in groups, agreed to take medications.  Patient was started on Remeron 7.5 mg nightly to help with sleep and appetite and Cymbalta 30 mg daily to help with depression and chronic pain.  On the day of discharge patient has tolerated the medications very well and denies any side effects, denies suicidal/homicidal ideation/intent/plan, denies hallucinations.  He has not been displaying any withdrawal symptoms today.  Not meeting IVC criteria.  Patient is willing to participate in outpatient mental health  services.  Physical Findings: AIMS:  , ,  ,  ,    CIWA:  CIWA-Ar Total: 1 COWS:        Psychiatric Specialty Exam:  Presentation  General Appearance:  Appropriate for Environment; Casual  Eye Contact: Fair  Speech: Clear and Coherent  Speech Volume: Normal    Mood and Affect  Mood: Euthymic  Affect: Appropriate   Thought Process  Thought Processes: Coherent  Descriptions of Associations:Intact  Orientation:Full (Time, Place and Person)  Thought Content:Logical  Hallucinations:Hallucinations: None  Ideas of Reference:None  Suicidal Thoughts:Suicidal  Thoughts: No  Homicidal Thoughts:Homicidal Thoughts: No   Sensorium  Memory: Immediate Fair; Recent Fair; Remote Fair  Judgment: Fair  Insight: Fair   Chartered certified accountant: Fair  Attention Span: Fair  Recall: [employer] of Knowledge: Fair  Language: Fair   Psychomotor Activity  Psychomotor Activity: Psychomotor Activity: Normal  Musculoskeletal: Strength & Muscle Tone: within normal limits Gait & Station: normal Assets  Assets: Manufacturing systems engineer; Desire for Improvement; Physical Health; Resilience   Sleep  Sleep: Sleep: Fair    Physical Exam: Physical Exam ROS Blood pressure 116/79, pulse 96, temperature (!) 97.5 F (36.4 C), temperature source Tympanic, resp. rate 18, height 5\' 7"  (1.702 m), weight 72.1 kg, SpO2 99%. Body mass index is 24.9 kg/m.   Social History   Tobacco Use  Smoking Status Every Day   Current packs/day: 0.50   Types: Cigarettes  Smokeless Tobacco Never   Tobacco Cessation:  N/A, patient does not currently use tobacco products   Blood Alcohol level:  Lab Results  Component Value Date   ETH 290 (H) 01/06/2024   ETH 146 (H) 12/29/2023    Metabolic Disorder Labs:  Lab Results  Component Value Date   HGBA1C 5.6 08/20/2021   MPG 114 08/20/2021   MPG 146 09/27/2018   No results found for: "PROLACTIN" Lab  Results  Component Value Date   CHOL 169 09/19/2021   TRIG 307 (H) 09/19/2021   HDL 49 09/19/2021   CHOLHDL 3.4 09/19/2021   VLDL NOT CALC 08/07/2016   LDLCALC 83 09/19/2021   LDLCALC 98 09/27/2018    See Psychiatric Specialty Exam and Suicide Risk Assessment completed by Attending Physician prior to discharge.  Discharge destination:  Home  Is patient on multiple antipsychotic therapies at discharge:  No   Has Patient had three or more failed trials of antipsychotic monotherapy by history:  No  Recommended Plan for Multiple Antipsychotic Therapies: NA  Discharge Instructions     Diet - low sodium heart healthy   Complete by: As directed    Increase activity slowly   Complete by: As directed       Allergies as of 01/09/2024   No Known Allergies      Medication List     TAKE these medications      Indication  DULoxetine 30 MG capsule Commonly known as: CYMBALTA Take 1 capsule (30 mg total) by mouth daily. Start taking on: January 10, 2024    mirtazapine 7.5 MG tablet Commonly known as: REMERON Take 1 tablet (7.5 mg total) by mouth at bedtime.    traZODone 50 MG tablet Commonly known as: DESYREL Take 1 tablet (50 mg total) by mouth at bedtime as needed for sleep.         Follow-up Information     [facility] Follow up.   Why: Please call to schedule an appointment,  appointments can be face to face once you are able to arrange transportation. Contact information: [address]  [address] [place] [address] [phone]                 Follow-up recommendations:  Activity:  As tolerated    Signed: Vasiliki Smaldone, MD 01/09/2024, 10:08 AM

## 2024-01-09 NOTE — Progress Notes (Signed)
   01/09/24 0900  Psych Admission Type (Psych Patients Only)  Admission Status Involuntary  Psychosocial Assessment  Patient Complaints None  Eye Contact Brief  Facial Expression Worried  Affect Anxious  Speech Logical/coherent  Interaction Assertive;Demanding  Motor Activity Unsteady  Appearance/Hygiene Disheveled;Bizarre  Behavior Characteristics Anxious  Mood Irritable  Thought Process  Coherency WDL  Content WDL  Delusions None reported or observed  Perception WDL  Hallucination None reported or observed  Judgment Impaired  Confusion None  Danger to Self  Current suicidal ideation? Denies  Danger to Others  Danger to Others None reported or observed

## 2024-01-09 NOTE — Plan of Care (Signed)
 Patient alert and oriented. Denies SI, HI, AVH and pain. Scheduled and PRN medications administered. Routine safety checks conducted every 15 minutes. Patient verbally contracts for safety at this time.  Randall Harvey. is a 63 y.o. male patient. No diagnosis found. Past Medical History:  Diagnosis Date   Alcohol intoxication (HCC)    Anticoagulant long-term use    lifetime use, coumadin therapy, then Xarelto   Anxiety    Arthritis    Cancer (HCC)    renal   Carotid atherosclerosis    <50% left and right   Clotting disorder (HCC)    COPD (chronic obstructive pulmonary disease) (HCC)    Depression    Elevated liver enzymes 11/2013   Family history of cancer    Fatty liver    H/O drug abuse (HCC)    H/O ETOH abuse    Hepatitis    History of traumatic brain injury    Hypertension    Lymphadenopathy    Obesity    Personal history of DVT (deep vein thrombosis) 06/2013   Pre-diabetes    Primary osteoarthritis of left knee    Pulmonary embolism and infarction (HCC) 06/2013   Pulmonary embolism and infarction (HCC) 11/2013   Pulmonary embolus with infarction [facility])    Renal cell carcinoma (HCC)    Renal mass, left 06/2013   with ureteral obstruction   Stroke (HCC)    Subarachnoid hemorrhage following injury (HCC)    Thrombocythemia    Thrombocytopenia (HCC)    Tobacco abuse    Current Facility-Administered Medications  Medication Dose Route Frequency Provider Last Rate Last Admin   acetaminophen (TYLENOL) tablet 650 mg  650 mg Oral Q6H PRN Gilman Lade, NP   650 mg at 01/08/24 2136   alum & mag hydroxide-simeth (MAALOX/MYLANTA) 200-200-20 MG/5ML suspension 30 mL  30 mL Oral Q4H PRN Gilman Lade, NP       DULoxetine (CYMBALTA) DR capsule 30 mg  30 mg Oral Daily Jadapalle, Sree, MD       magnesium hydroxide (MILK OF MAGNESIA) suspension 30 mL  30 mL Oral Daily PRN Lorrene Rosser, Veronique M, NP       mirtazapine (REMERON) tablet 7.5 mg  7.5 mg Oral QHS Jadapalle,  Sree, MD   7.5 mg at 01/08/24 2136   nicotine (NICODERM CQ - dosed in mg/24 hours) patch 14 mg  14 mg Transdermal Daily Jadapalle, Sree, MD       traZODone (DESYREL) tablet 50 mg  50 mg Oral QHS PRN Byungura, Veronique M, NP   50 mg at 01/08/24 2057   No Known Allergies Principal Problem:   MDD (major depressive disorder), recurrent episode (HCC)  Blood pressure 135/71, pulse 89, temperature (!) 96.8 F (36 C), resp. rate 16, height 5\' 7"  (1.702 m), weight 72.1 kg, SpO2 97%.    Randall Harvey 01/09/2024

## 2024-01-09 NOTE — Group Note (Signed)
 Date:  01/09/2024 Time:  12:18 PM  Group Topic/Focus:  Building Self Esteem:   The Focus of this group is helping patients become aware of the effects of self-esteem on their lives, the things they and others do that enhance or undermine their self-esteem, seeing the relationship between their level of self-esteem and the choices they make and learning ways to enhance self-esteem.    Participation Level:  Enaged  Participation Quality:  Active  Affect:  Appropriate  Cognitive:  Appropriate  Insight: Appropriate  Engagement in Group:  Engaged  Modes of Intervention:  Activity  Additional Comments:    Gordie Crumby 01/09/2024, 12:18 PM

## 2024-01-09 NOTE — Progress Notes (Signed)
  [facility] Adult Case Management Discharge Plan :  Will you be returning to the same living situation after discharge:  Yes,  Patient returning to home address at discharge.  At discharge, do you have transportation home?: No. Do you have the ability to pay for your medications: Yes,  Patient reports he can afford his medications.  Release of information consent forms completed and in the chart;  Patient's signature needed at discharge.  Patient to Follow up at:  Follow-up Information     [facility] Follow up.   Why: Please call to schedule an appointment,  appointments can be face to face once you are able to arrange transportation. Contact information: [address]  [address] [place] [address] [phone]                 Next level of care provider has access to [facility]:yes  Safety Planning and Suicide Prevention discussed: Yes,  SPE completed with pt on 01/08/2024. Pt refused to consent to family contact.     Has patient been referred to the Quitline?: Patient refused referral for treatment  Patient has been referred for addiction treatment: Yes, referral information given but appointment not made [facility] (list facility).  Darris Emery Chamari Cutbirth, LCSWA 01/09/2024, 12:03 PM

## 2024-01-09 NOTE — [facility] Suicide Risk Assessment (Signed)
 [facility] Discharge Suicide Risk Assessment   Principal Problem: MDD (major depressive disorder), recurrent episode (HCC) Discharge Diagnoses: Principal Problem:   MDD (major depressive disorder), recurrent episode (HCC)   Total Time spent with patient: 30 minutes  Musculoskeletal: Strength & Muscle Tone: within normal limits Gait & Station: unsteady Patient leans: N/A  Psychiatric Specialty Exam  Presentation  General Appearance:  Appropriate for Environment; Casual  Eye Contact: Fair  Speech: Clear and Coherent  Speech Volume: Normal  Handedness: Right   Mood and Affect  Mood: Euthymic  Duration of Depression Symptoms: Greater than two weeks  Affect: Appropriate   Thought Process  Thought Processes: Coherent  Descriptions of Associations:Intact  Orientation:Full (Time, Place and Person)  Thought Content:Logical  History of Schizophrenia/Schizoaffective disorder:No  Duration of Psychotic Symptoms:N/A  Hallucinations:Hallucinations: None  Ideas of Reference:None  Suicidal Thoughts:Suicidal Thoughts: No  Homicidal Thoughts:Homicidal Thoughts: No   Sensorium  Memory: Immediate Fair; Recent Fair; Remote Fair  Judgment: Fair  Insight: Fair   Art therapist  Concentration: Fair  Attention Span: Fair  Recall: [employer] of Knowledge: Fair  Language: Fair   Psychomotor Activity  Psychomotor Activity: Psychomotor Activity: Normal   Assets  Assets: Communication Skills; Desire for Improvement; Physical Health; Resilience   Sleep  Sleep: Sleep: Fair   Physical Exam: Physical Exam ROS Blood pressure 116/79, pulse 96, temperature (!) 97.5 F (36.4 C), temperature source Tympanic, resp. rate 18, height 5\' 7"  (1.702 m), weight 72.1 kg, SpO2 99%. Body mass index is 24.9 kg/m.  Mental Status Per Nursing Assessment::   On Admission:  NA  Demographic Factors:  Male and Randall Harvey, lesbian, or bisexual orientation  Loss  Factors: Decrease in vocational status  Historical Factors: Impulsivity  Risk Reduction Factors:   Religious beliefs about death, Living with another person, especially a relative, Positive social support, Positive therapeutic relationship, and Positive coping skills or problem solving skills  Continued Clinical Symptoms:  Alcohol/Substance Abuse/Dependencies  Cognitive Features That Contribute To Risk:  None    Suicide Risk:  Minimal: No identifiable suicidal ideation.  Patients presenting with no risk factors but with morbid ruminations; may be classified as minimal risk based on the severity of the depressive symptoms   Follow-up Information     [facility] Follow up.   Why: Please call to schedule an appointment,  appointments can be face to face once you are able to arrange transportation. Contact information: [address]  [address] [place] [address] [phone]                 Plan Of Care/Follow-up recommendations:  Activity:  As tolerated  Aurelia Blotter, MD 01/09/2024, 10:08 AM

## 2024-01-09 NOTE — Plan of Care (Signed)
  Problem: Education: Goal: Utilization of techniques to improve thought processes will improve Outcome: Adequate for Discharge Goal: Knowledge of the prescribed therapeutic regimen will improve Outcome: Adequate for Discharge   Problem: Activity: Goal: Interest or engagement in leisure activities will improve Outcome: Adequate for Discharge Goal: Imbalance in normal sleep/wake cycle will improve Outcome: Adequate for Discharge   Problem: Coping: Goal: Coping ability will improve Outcome: Adequate for Discharge Goal: Will verbalize feelings Outcome: Adequate for Discharge   Problem: Health Behavior/Discharge Planning: Goal: Ability to make decisions will improve Outcome: Adequate for Discharge Goal: Compliance with therapeutic regimen will improve Outcome: Adequate for Discharge   Problem: Role Relationship: Goal: Will demonstrate positive changes in social behaviors and relationships Outcome: Adequate for Discharge   Problem: Safety: Goal: Ability to disclose and discuss suicidal ideas will improve Outcome: Adequate for Discharge Goal: Ability to identify and utilize support systems that promote safety will improve Outcome: Adequate for Discharge   Problem: Self-Concept: Goal: Will verbalize positive feelings about self Outcome: Adequate for Discharge Goal: Level of anxiety will decrease Outcome: Adequate for Discharge   Problem: Education: Goal: Ability to state activities that reduce stress will improve Outcome: Adequate for Discharge   Problem: Coping: Goal: Ability to identify and develop effective coping behavior will improve Outcome: Adequate for Discharge   Problem: Self-Concept: Goal: Ability to identify factors that promote anxiety will improve Outcome: Adequate for Discharge Goal: Level of anxiety will decrease Outcome: Adequate for Discharge Goal: Ability to modify response to factors that promote anxiety will improve Outcome: Adequate for Discharge    Problem: Education: Goal: Knowledge of [facility] [facility] Education information/materials will improve Outcome: Adequate for Discharge Goal: Emotional status will improve Outcome: Adequate for Discharge Goal: Mental status will improve Outcome: Adequate for Discharge Goal: Verbalization of understanding the information provided will improve Outcome: Adequate for Discharge   Problem: Activity: Goal: Interest or engagement in activities will improve Outcome: Adequate for Discharge Goal: Sleeping patterns will improve Outcome: Adequate for Discharge   Problem: Coping: Goal: Ability to verbalize frustrations and anger appropriately will improve Outcome: Adequate for Discharge Goal: Ability to demonstrate self-control will improve Outcome: Adequate for Discharge   Problem: Health Behavior/Discharge Planning: Goal: Identification of resources available to assist in meeting health care needs will improve Outcome: Adequate for Discharge Goal: Compliance with treatment plan for underlying cause of condition will improve Outcome: Adequate for Discharge   Problem: Physical Regulation: Goal: Ability to maintain clinical measurements within normal limits will improve Outcome: Adequate for Discharge   Problem: Safety: Goal: Periods of time without injury will increase Outcome: Adequate for Discharge

## 2024-01-11 ENCOUNTER — Other Ambulatory Visit: Payer: Self-pay

## 2024-01-13 ENCOUNTER — Emergency Department
Admission: EM | Admit: 2024-01-13 | Discharge: 2024-01-14 | Disposition: A | Discharge status: Home / Self Care | Attending: Emergency Medicine | Admitting: Emergency Medicine

## 2024-01-13 ENCOUNTER — Other Ambulatory Visit: Payer: Self-pay

## 2024-01-13 ENCOUNTER — Encounter: Payer: Self-pay | Admitting: Intensive Care

## 2024-01-13 ENCOUNTER — Emergency Department

## 2024-01-13 DIAGNOSIS — S0990XA Unspecified injury of head, initial encounter: Secondary | ICD-10-CM | POA: Insufficient documentation

## 2024-01-13 DIAGNOSIS — W19XXXA Unspecified fall, initial encounter: Secondary | ICD-10-CM | POA: Diagnosis not present

## 2024-01-13 DIAGNOSIS — S4992XA Unspecified injury of left shoulder and upper arm, initial encounter: Secondary | ICD-10-CM | POA: Diagnosis present

## 2024-01-13 DIAGNOSIS — G9389 Other specified disorders of brain: Secondary | ICD-10-CM | POA: Diagnosis not present

## 2024-01-13 DIAGNOSIS — R590 Localized enlarged lymph nodes: Secondary | ICD-10-CM | POA: Insufficient documentation

## 2024-01-13 DIAGNOSIS — S41112A Laceration without foreign body of left upper arm, initial encounter: Secondary | ICD-10-CM | POA: Insufficient documentation

## 2024-01-13 DIAGNOSIS — D72829 Elevated white blood cell count, unspecified: Secondary | ICD-10-CM | POA: Insufficient documentation

## 2024-01-13 DIAGNOSIS — R0789 Other chest pain: Secondary | ICD-10-CM | POA: Diagnosis present

## 2024-01-13 DIAGNOSIS — Y909 Presence of alcohol in blood, level not specified: Secondary | ICD-10-CM | POA: Diagnosis not present

## 2024-01-13 DIAGNOSIS — F10129 Alcohol abuse with intoxication, unspecified: Secondary | ICD-10-CM | POA: Insufficient documentation

## 2024-01-13 DIAGNOSIS — S80211A Abrasion, right knee, initial encounter: Secondary | ICD-10-CM | POA: Insufficient documentation

## 2024-01-13 DIAGNOSIS — R591 Generalized enlarged lymph nodes: Secondary | ICD-10-CM

## 2024-01-13 DIAGNOSIS — S40812A Abrasion of left upper arm, initial encounter: Secondary | ICD-10-CM | POA: Insufficient documentation

## 2024-01-13 DIAGNOSIS — F10920 Alcohol use, unspecified with intoxication, uncomplicated: Secondary | ICD-10-CM

## 2024-01-13 DIAGNOSIS — F10120 Alcohol abuse with intoxication, uncomplicated: Secondary | ICD-10-CM | POA: Diagnosis not present

## 2024-01-13 LAB — CBC WITH DIFFERENTIAL/PLATELET
Abs Immature Granulocytes: 0.03 10*3/uL (ref 0.00–0.07)
Basophils Absolute: 0.1 10*3/uL (ref 0.0–0.1)
Basophils Relative: 1 %
Eosinophils Absolute: 0.1 10*3/uL (ref 0.0–0.5)
Eosinophils Relative: 1 %
HCT: 42.5 % (ref 39.0–52.0)
Hemoglobin: 14.2 g/dL (ref 13.0–17.0)
Immature Granulocytes: 0 %
Lymphocytes Relative: 66 %
Lymphs Abs: 7.2 10*3/uL — ABNORMAL HIGH (ref 0.7–4.0)
MCH: 33.3 pg (ref 26.0–34.0)
MCHC: 33.4 g/dL (ref 30.0–36.0)
MCV: 99.8 fL (ref 80.0–100.0)
Monocytes Absolute: 0.5 10*3/uL (ref 0.1–1.0)
Monocytes Relative: 4 %
Neutro Abs: 3 10*3/uL (ref 1.7–7.7)
Neutrophils Relative %: 28 %
Platelets: 131 10*3/uL — ABNORMAL LOW (ref 150–400)
RBC: 4.26 MIL/uL (ref 4.22–5.81)
RDW: 12 % (ref 11.5–15.5)
Smear Review: NORMAL
WBC: 10.8 10*3/uL — ABNORMAL HIGH (ref 4.0–10.5)
nRBC: 0 % (ref 0.0–0.2)

## 2024-01-13 LAB — COMPREHENSIVE METABOLIC PANEL WITH GFR
ALT: 43 U/L (ref 0–44)
AST: 28 U/L (ref 15–41)
Albumin: 3.9 g/dL (ref 3.5–5.0)
Alkaline Phosphatase: 57 U/L (ref 38–126)
Anion gap: 7 (ref 5–15)
BUN: 17 mg/dL (ref 8–23)
CO2: 25 mmol/L (ref 22–32)
Calcium: 8.5 mg/dL — ABNORMAL LOW (ref 8.9–10.3)
Chloride: 108 mmol/L (ref 98–111)
Creatinine, Ser: 0.73 mg/dL (ref 0.61–1.24)
GFR, Estimated: >60 mL/min (ref >60)
Glucose, Bld: 121 mg/dL — ABNORMAL HIGH (ref 70–99)
Potassium: 4 mmol/L (ref 3.5–5.1)
Sodium: 140 mmol/L (ref 135–145)
Total Bilirubin: 0.6 mg/dL (ref 0.0–1.2)
Total Protein: 7.2 g/dL (ref 6.5–8.1)

## 2024-01-13 LAB — CK: Total CK: 37 U/L — ABNORMAL LOW (ref 49–397)

## 2024-01-13 LAB — SALICYLATE LEVEL: Salicylate Lvl: <7 mg/dL — ABNORMAL LOW (ref 7.0–30.0)

## 2024-01-13 LAB — TROPONIN I (HIGH SENSITIVITY)
Troponin I (High Sensitivity): 6 ng/L (ref <18)
Troponin I (High Sensitivity): 5 ng/L (ref <18)

## 2024-01-13 LAB — ACETAMINOPHEN LEVEL: Acetaminophen (Tylenol), Serum: <10 ug/mL — ABNORMAL LOW (ref 10–30)

## 2024-01-13 MED ORDER — LIDOCAINE 5 % EX PTCH
1.0000 | MEDICATED_PATCH | CUTANEOUS | Status: DC
  Administered 2024-01-13: 1 via TRANSDERMAL
  Filled 2024-01-13: qty 1

## 2024-01-13 MED ORDER — DROPERIDOL 2.5 MG/ML IJ SOLN
1.2500 mg | Freq: Once | INTRAMUSCULAR | Status: AC
  Administered 2024-01-13: 1.25 mg via INTRAVENOUS
  Filled 2024-01-13: qty 2

## 2024-01-13 NOTE — ED Notes (Signed)
 Pt to radiology via wheelchair

## 2024-01-13 NOTE — ED Notes (Signed)
 Pt arrived via EMS complaining of pain in L hand, L arm, L upper chest / clavicle.  Pt presents heavily intoxicated with alcohol. Pt was seen walking along the side of the road. Passerby called, ems located pt in ditch bleeding from L hand, L knee EDP notified of arrival

## 2024-01-13 NOTE — ED Notes (Signed)
 Pt resting comfortably in bed. Pt ABCs intact. RR even and unlabored. Pt in NAD. Bed in lowest locked position.

## 2024-01-13 NOTE — ED Provider Notes (Signed)
-----------------------------------------   11:40 PM on 01/13/2024 -----------------------------------------   Assumed care of patient who remains intoxicated.  Will continue to care for and monitor patient until sobriety.  ----------------------------------------- 6:46 AM on 01/14/2024 -----------------------------------------   Patient awake, ambulatory with steady gait.  Sipping on ginger ale.  Will discharge home once patient has secured a ride.  Strict return precautions given.  Patient verbalizes understanding and agrees with plan of care.   Nicola Quesnell J, MD 01/14/24 [phone]

## 2024-01-13 NOTE — ED Notes (Signed)
 Pt observed laying down in bed, taking a nap to sober up. Pt understands plan of care, provided by MD [facility]. Pt ABCs intact. RR even and unlabored. Pt in NAD. Bed in lowest locked position. Denies needs at this time.

## 2024-01-13 NOTE — ED Notes (Signed)
 Pt attempted to leave ED but unable to get through doors. Pt then redirected back to bed.

## 2024-01-13 NOTE — ED Notes (Signed)
 Pt still sleeping comfortably in the hallway bed assigned. Pt ABCs intact. RR even and unlabored. Pt in NAD. Bed in lowest locked position.

## 2024-01-13 NOTE — Discharge Instructions (Addendum)
 Your workup was reassuring but they did see some lymphadenopathy that needs to be followed up with the oncology team.  I placed a referral.  You should cut down your alcohol use as this can be dangerous and return to the ER for worsening symptoms or any other concerns.  Do not stop drinking alcohol cold [place] as this can lead to alcohol withdrawal, seizures.   IMPRESSION: No acute stroke or acute traumatic finding. 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 Chronic atrophy and encephalomalacia of the right temporal lobe and right frontal lobe, possibly due to old closed head injury. Generalized brain atrophy otherwise.   IMPRESSION: 1. No acute or traumatic finding. Chronic spondylosis at C5-6 and C6-7 with mild bilateral bony foraminal narrowing. 2. Chronic regional lymphadenopathy. Lymphoma or other lymphoproliferative disease is the likely explanation.

## 2024-01-13 NOTE — ED Provider Notes (Addendum)
 [facility] Provider Note    Event Date/Time   First MD Initiated Contact with Patient 01/13/24 1738     (approximate)   History   Fall and Alcohol Intoxication   HPI  Randall Harvey. is a 63 y.o. male who comes in with a fall.  I reviewed a discharge note from 01/09/2024 where patient was admitted for suicidal ideations, alcohol abuse.  Patient reportedly was drinking alcohol and suffered a fall.  Patient has abrasions and laceration on the left arm and right knee.  Patient reports some chest pain.  Unclear if this happened before or after the fall.  Tdap 2025  Physical Exam   Triage Vital Signs: Blood pressure 111/78, pulse 80, temperature 97.6 F (36.4 C), temperature source Oral, resp. rate 17, height 5\' 7"  (1.702 m), weight 73.5 kg, SpO2 99%.  Most recent vital signs: Vitals:   01/13/24 1738  BP: 111/78  Pulse: 80  Resp: 17  Temp: 97.6 F (36.4 C)  SpO2: 99%     General: Awake, no distress.  CV:  Good peripheral perfusion.  Resp:  Normal effort.  Abd:  No distention.  Other:  Patient appears intoxicated, abrasion to the right knee, abrasion to the left arm  When I push on patient's abdomen he states that it makes him have to pee but he denies any abdominal tenderness.  He is got no bruising noted.  He has no bruising noted on the chest wall.  He reports some mild tenderness with palpation on the left midsternal area. Small skin tears on the left arm noted.  ED Results / Procedures / Treatments   Labs (all labs ordered are listed, but only abnormal results are displayed) Labs Reviewed  CBC WITH DIFFERENTIAL/PLATELET - Abnormal; Notable for the following components:      Result Value   WBC 10.8 (*)    Platelets 131 (*)    Lymphs Abs 7.2 (*)    All other components within normal limits  COMPREHENSIVE METABOLIC PANEL WITH GFR - Abnormal; Notable for the following components:   Glucose, Bld 121 (*)    Calcium 8.5 (*)    All other  components within normal limits  SALICYLATE LEVEL - Abnormal; Notable for the following components:   Salicylate Lvl <7.0 (*)    All other components within normal limits  ACETAMINOPHEN LEVEL - Abnormal; Notable for the following components:   Acetaminophen (Tylenol), Serum <10 (*)    All other components within normal limits  CK - Abnormal; Notable for the following components:   Total CK 37 (*)    All other components within normal limits  PATHOLOGIST SMEAR REVIEW  TROPONIN I (HIGH SENSITIVITY)  TROPONIN I (HIGH SENSITIVITY)     EKG  My interpretation of EKG:  Normal sinus rate of 83, no st elevation, no twi RBBB  Similar prior EKG  RADIOLOGY I have reviewed the CT personally and interpreted no ICH    PROCEDURES:  Critical Care performed: No  .1-3 Lead EKG Interpretation  Performed by: Concha Se, MD Authorized by: Concha Se, MD     Interpretation: normal     ECG rate:  80   ECG rate assessment: normal     Rhythm: sinus rhythm     Ectopy: none     Conduction: normal      MEDICATIONS ORDERED IN ED: Medications  lidocaine (LIDODERM) 5 % 1 patch (1 patch Transdermal Patch Applied 01/13/24 1819)  droperidol (INAPSINE) 2.5  MG/ML injection 1.25 mg (1.25 mg Intravenous Given 01/13/24 1817)     IMPRESSION / MDM / ASSESSMENT AND PLAN / ED COURSE  I reviewed the triage vital signs and the nursing notes.   Patient's presentation is most consistent with acute presentation with potential threat to life or bodily function.   Patient comes in with concerns for fall in the setting of alcohol use.  He reports some chest pain will get EKG, cardiac markers to evaluate for ACS.  Will get chest x-ray to make sure no obvious rib fracture, pneumothorax.  X-rays evaluate for fractures, CT imaging evaluate for intracranial hemorrhage, cervical fracture.  Patient given some droperidol to help with getting CT imaging.  Troponin was negative.  CBC slightly elevated white count.   CMP reassuring salicylate negative Tylenol and negative CK normal  IMPRESSION: No acute stroke or acute traumatic finding. 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 Chronic atrophy and encephalomalacia of the right temporal lobe and right frontal lobe, possibly due to old closed head injury. Generalized brain atrophy otherwise.   IMPRESSION: 1. No acute or traumatic finding. Chronic spondylosis at C5-6 and C6-7 with mild bilateral bony foraminal narrowing. 2. Chronic regional lymphadenopathy. Lymphoma or other lymphoproliferative disease is the likely explanation.   Palced above in dc summary so pt can follow up with oncology  Xray no obvious fracture- waiting on read.   8:14 PM patient resting comfortably in bed.  I reevaluated patient and he denies any SI.  Patient has been up and ambulatory still has a low bit of slurred speech so we will continue to monitor for sober clearance.  He has no snuffbox tenderness to suggest needing a splint. I evaluated the wounds on his arms that are all small skin tears nothing that would require sutures.  Pt will need MTF and then re-peat eval and dc. Pt was able to ambulate so doubt hip fracture.   The patient is on the cardiac monitor to evaluate for evidence of arrhythmia and/or significant heart rate changes.      FINAL CLINICAL IMPRESSION(S) / ED DIAGNOSES   Final diagnoses:  Lymphadenopathy  Alcoholic intoxication without complication (HCC)  Fall, initial encounter     Rx / DC Orders   ED Discharge Orders     None        Note:  This document was prepared using Dragon voice recognition software and may include unintentional dictation errors.   Lubertha Rush, MD 01/13/24 2015    Lubertha Rush, MD 01/13/24 2016    Lubertha Rush, MD 01/13/24 2329

## 2024-01-14 ENCOUNTER — Other Ambulatory Visit: Payer: Self-pay

## 2024-01-14 ENCOUNTER — Emergency Department

## 2024-01-14 ENCOUNTER — Emergency Department
Admission: EM | Admit: 2024-01-14 | Discharge: 2024-01-14 | Disposition: A | Discharge status: Home / Self Care | Source: Home / Self Care | Attending: Emergency Medicine | Admitting: Emergency Medicine

## 2024-01-14 DIAGNOSIS — R0789 Other chest pain: Secondary | ICD-10-CM | POA: Insufficient documentation

## 2024-01-14 DIAGNOSIS — F10120 Alcohol abuse with intoxication, uncomplicated: Secondary | ICD-10-CM | POA: Insufficient documentation

## 2024-01-14 DIAGNOSIS — W19XXXA Unspecified fall, initial encounter: Secondary | ICD-10-CM | POA: Insufficient documentation

## 2024-01-14 DIAGNOSIS — F10920 Alcohol use, unspecified with intoxication, uncomplicated: Secondary | ICD-10-CM

## 2024-01-14 LAB — BASIC METABOLIC PANEL WITH GFR
Anion gap: 13 (ref 5–15)
BUN: 15 mg/dL (ref 8–23)
CO2: 21 mmol/L — ABNORMAL LOW (ref 22–32)
Calcium: 8.6 mg/dL — ABNORMAL LOW (ref 8.9–10.3)
Chloride: 104 mmol/L (ref 98–111)
Creatinine, Ser: 1.29 mg/dL — ABNORMAL HIGH (ref 0.61–1.24)
GFR, Estimated: >60 mL/min (ref >60)
Glucose, Bld: 102 mg/dL — ABNORMAL HIGH (ref 70–99)
Potassium: 3.1 mmol/L — ABNORMAL LOW (ref 3.5–5.1)
Sodium: 138 mmol/L (ref 135–145)

## 2024-01-14 LAB — CBC
HCT: 42.4 % (ref 39.0–52.0)
Hemoglobin: 14.2 g/dL (ref 13.0–17.0)
MCH: 33.6 pg (ref 26.0–34.0)
MCHC: 33.5 g/dL (ref 30.0–36.0)
MCV: 100.5 fL — ABNORMAL HIGH (ref 80.0–100.0)
Platelets: 135 10*3/uL — ABNORMAL LOW (ref 150–400)
RBC: 4.22 MIL/uL (ref 4.22–5.81)
RDW: 11.9 % (ref 11.5–15.5)
WBC: 10.9 10*3/uL — ABNORMAL HIGH (ref 4.0–10.5)
nRBC: 0 % (ref 0.0–0.2)

## 2024-01-14 LAB — TROPONIN I (HIGH SENSITIVITY): Troponin I (High Sensitivity): 8 ng/L (ref <18)

## 2024-01-14 LAB — PATHOLOGIST SMEAR REVIEW

## 2024-01-14 LAB — ETHANOL: Alcohol, Ethyl (B): 217 mg/dL — ABNORMAL HIGH (ref <10)

## 2024-01-14 NOTE — ED Notes (Signed)
 Pt still resting appropriately in bed. Pt woke up temporarily when another pt was crying, but pt went back to sleep pretty easily. Pt ABCs intact. RR even and unlabored. Pt in NAD. Bed in lowest locked position.

## 2024-01-14 NOTE — ED Triage Notes (Signed)
 Pt here via [facility] after a fall, etoh on board per ems. Pt was found on the street, fell trying to pick up his hat, denies LOC. Pt states he fell yesterday as well. Pt endorses cp.   105 95% RA 113/76 89-cbg

## 2024-01-14 NOTE — ED Notes (Signed)
 Pt ambulated to the restroom at this time with steady gait to relieve himself. Pt ambulated back and went back to sleep. Pt appears sober at this time.

## 2024-01-14 NOTE — ED Notes (Signed)
 Pt does not want to wait for xray results, wishes to leave at this time. Dr. Karlynn Oyster notified, and he is working on d/c papers at this time

## 2024-01-14 NOTE — ED Notes (Signed)
 Pt ABCs intact. RR even and unlabored. Pt in NAD. Bed in lowest locked position.

## 2024-01-14 NOTE — ED Provider Notes (Signed)
 [facility] Provider Note    Event Date/Time   First MD Initiated Contact with Patient 01/14/24 1655     (approximate)   History   Fall   HPI Deshan Hemmelgarn. is a 63 y.o. male presenting today for alcohol abuse and fall.  Patient was just seen in the ED yesterday for fall.  He states coming back in today because of chest pain on his left side.  Denies any other acute symptoms.  Does note he has been drinking today.  Denies any injury to his head or neck.  Chart review from yesterday shows no evidence of rib fractures on x-ray and troponins negative x 2.  EKG unremarkable.  Patient was safe for discharge after he had sobered up.     Physical Exam   Triage Vital Signs: ED Triage Vitals  Encounter Vitals Group     BP 01/14/24 1648 94/69     Systolic BP Percentile --      Diastolic BP Percentile --      Pulse Rate 01/14/24 1648 (!) 101     Resp 01/14/24 1648 19     Temp 01/14/24 1648 98.5 F (36.9 C)     Temp Source 01/14/24 1648 Oral     SpO2 01/14/24 1648 95 %     Weight 01/14/24 1649 162 lb 0.6 oz (73.5 kg)     Height 01/14/24 1649 5\' 7"  (1.702 m)     Head Circumference --      Peak Flow --      Pain Score 01/14/24 1649 8     Pain Loc --      Pain Education --      Exclude from Growth Chart --     Most recent vital signs: Vitals:   01/14/24 1648  BP: 94/69  Pulse: (!) 101  Resp: 19  Temp: 98.5 F (36.9 C)  SpO2: 95%   I have reviewed the vital signs. General:  Awake, alert, no acute distress. Head:  Normocephalic, Atraumatic. EENT:  PERRL, EOMI, Oral mucosa pink and moist, Neck is supple. Cardiovascular: Regular rate, 2+ distal pulses. Respiratory:  Normal respiratory effort, symmetrical expansion, no distress.   Extremities:  Moving all four extremities through full ROM without pain.  Slight tenderness palpation over left chest wall. Neuro:  Alert and oriented.  Interacting appropriately.   Skin:  Warm, dry, no rash.   Psych:  Appropriate affect.    ED Results / Procedures / Treatments   Labs (all labs ordered are listed, but only abnormal results are displayed) Labs Reviewed  BASIC METABOLIC PANEL WITH GFR - Abnormal; Notable for the following components:      Result Value   Potassium 3.1 (*)    CO2 21 (*)    Glucose, Bld 102 (*)    Creatinine, Ser 1.29 (*)    Calcium 8.6 (*)    All other components within normal limits  CBC - Abnormal; Notable for the following components:   WBC 10.9 (*)    MCV 100.5 (*)    Platelets 135 (*)    All other components within normal limits  ETHANOL - Abnormal; Notable for the following components:   Alcohol, Ethyl (B) 217 (*)    All other components within normal limits  TROPONIN I (HIGH SENSITIVITY)     EKG My EKG interpretation: Rate of 97, normal sinus rhythm, right bundle branch block.  No acute ST elevations or depressions.  Consistent with baseline  RADIOLOGY Independently interpreted chest x-ray with no acute pathology   PROCEDURES:  Critical Care performed: No  Procedures   MEDICATIONS ORDERED IN ED: Medications - No data to display   IMPRESSION / MDM / ASSESSMENT AND PLAN / ED COURSE  I reviewed the triage vital signs and the nursing notes.                              Differential diagnosis includes, but is not limited to, rib contusion, low concern for rib fracture or ACS  Patient's presentation is most consistent with acute complicated illness / injury requiring diagnostic workup.  Patient is a 63 year old male presenting for alcohol intoxication and chest pain.  Chest pain has been present since he fell yesterday and landed on that side.  X-rays yesterday showed no sign of fracture.  No new falls that he is aware of.  No injury.  Patient intoxicated on arrival with alcohol up to 17.  CBC and BMP otherwise show slight dehydration but otherwise unremarkable.  Troponin stable at his baseline.  No indication to repeat as it has been stable  from yesterday.  No fractures interpreted on my read of the x-ray.  Patient did not want to stay for imaging results.  He had an x-ray yesterday that also showed no fractures so I do not still wait here.  Clinically sober at this time and safe for discharge  Clinical Course as of 01/14/24 1924  Thu Jan 14, 2024  1739 Troponin I (High Sensitivity): 8 Chest pain has been going on for over a day.  No indication to repeat.  Troponin yesterday at similar values when chest pain was first assessed. [DW]    Clinical Course User Index [DW] Kandee Orion, MD     FINAL CLINICAL IMPRESSION(S) / ED DIAGNOSES   Final diagnoses:  Fall, initial encounter  Alcoholic intoxication without complication (HCC)     Rx / DC Orders   ED Discharge Orders     None        Note:  This document was prepared using Dragon voice recognition software and may include unintentional dictation errors.   Kandee Orion, MD 01/14/24 [phone]

## 2024-01-14 NOTE — ED Notes (Signed)
 Patient verbalizes understanding of discharge instructions. Opportunity for questioning and answers were provided. Armband removed by staff, pt discharged from ED. Ambulated out to lobby, will catch the bus home

## 2024-01-15 ENCOUNTER — Telehealth: Payer: Self-pay

## 2024-01-15 ENCOUNTER — Encounter: Payer: Self-pay | Admitting: Emergency Medicine

## 2024-01-15 NOTE — Transitions of Care (Post Inpatient/ED Visit) (Signed)
   01/15/2024  Name: Randall Harvey. MRN: [id number] DOB: 05-11-1961  Today's TOC FU Call Status: Today's TOC FU Call Status:: Unsuccessful Call (1st Attempt) Unsuccessful Call (1st Attempt) Date: 01/15/24 Patient's Name and Date of Birth confirmed.  Transition Care Management Follow-up Telephone Call Date of Discharge: 01/14/24 Discharge Facility: [facility]) Type of Discharge: Emergency Department Reason for ED Visit: Other: (alcohol use)  Items Reviewed:    Medications Reviewed Today: Medications Reviewed Today   Medications were not reviewed in this encounter     Home Care and Equipment/Supplies:    Functional Questionnaire:    Follow up appointments reviewed: PCP Follow-up appointment confirmed?: No (never seen in office)    SIGNATURE Darrall Ellison, LPN [facility] Nurse Health Advisor Direct Dial [phone]

## 2024-01-19 ENCOUNTER — Emergency Department

## 2024-01-19 ENCOUNTER — Other Ambulatory Visit: Payer: Self-pay

## 2024-01-19 ENCOUNTER — Emergency Department: Admission: EM | Admit: 2024-01-19 | Discharge: 2024-01-19 | Disposition: A | Discharge status: Home / Self Care

## 2024-01-19 DIAGNOSIS — R0789 Other chest pain: Secondary | ICD-10-CM | POA: Insufficient documentation

## 2024-01-19 DIAGNOSIS — E119 Type 2 diabetes mellitus without complications: Secondary | ICD-10-CM | POA: Insufficient documentation

## 2024-01-19 DIAGNOSIS — Y907 Blood alcohol level of 200-239 mg/100 ml: Secondary | ICD-10-CM | POA: Insufficient documentation

## 2024-01-19 DIAGNOSIS — F10129 Alcohol abuse with intoxication, unspecified: Secondary | ICD-10-CM | POA: Insufficient documentation

## 2024-01-19 DIAGNOSIS — R079 Chest pain, unspecified: Secondary | ICD-10-CM

## 2024-01-19 DIAGNOSIS — Z859 Personal history of malignant neoplasm, unspecified: Secondary | ICD-10-CM | POA: Diagnosis not present

## 2024-01-19 DIAGNOSIS — F1010 Alcohol abuse, uncomplicated: Secondary | ICD-10-CM

## 2024-01-19 DIAGNOSIS — Z7901 Long term (current) use of anticoagulants: Secondary | ICD-10-CM | POA: Insufficient documentation

## 2024-01-19 DIAGNOSIS — J449 Chronic obstructive pulmonary disease, unspecified: Secondary | ICD-10-CM | POA: Insufficient documentation

## 2024-01-19 DIAGNOSIS — W19XXXA Unspecified fall, initial encounter: Secondary | ICD-10-CM | POA: Insufficient documentation

## 2024-01-19 DIAGNOSIS — F1019 Alcohol abuse with unspecified alcohol-induced disorder: Secondary | ICD-10-CM | POA: Insufficient documentation

## 2024-01-19 LAB — COMPREHENSIVE METABOLIC PANEL WITH GFR
ALT: 26 U/L (ref 0–44)
AST: 29 U/L (ref 15–41)
Albumin: 3.6 g/dL (ref 3.5–5.0)
Alkaline Phosphatase: 70 U/L (ref 38–126)
Anion gap: 16 — ABNORMAL HIGH (ref 5–15)
BUN: 12 mg/dL (ref 8–23)
CO2: 17 mmol/L — ABNORMAL LOW (ref 22–32)
Calcium: 8.2 mg/dL — ABNORMAL LOW (ref 8.9–10.3)
Chloride: 105 mmol/L (ref 98–111)
Creatinine, Ser: 1.2 mg/dL (ref 0.61–1.24)
GFR, Estimated: >60 mL/min (ref >60)
Glucose, Bld: 122 mg/dL — ABNORMAL HIGH (ref 70–99)
Potassium: 3.2 mmol/L — ABNORMAL LOW (ref 3.5–5.1)
Sodium: 138 mmol/L (ref 135–145)
Total Bilirubin: 1.2 mg/dL (ref 0.0–1.2)
Total Protein: 6.2 g/dL — ABNORMAL LOW (ref 6.5–8.1)

## 2024-01-19 LAB — CBC WITH DIFFERENTIAL/PLATELET
Abs Immature Granulocytes: 0.02 10*3/uL (ref 0.00–0.07)
Basophils Absolute: 0 10*3/uL (ref 0.0–0.1)
Basophils Relative: 0 %
Eosinophils Absolute: 0.1 10*3/uL (ref 0.0–0.5)
Eosinophils Relative: 1 %
HCT: 42.7 % (ref 39.0–52.0)
Hemoglobin: 14.2 g/dL (ref 13.0–17.0)
Immature Granulocytes: 0 %
Lymphocytes Relative: 56 %
Lymphs Abs: 5.3 10*3/uL — ABNORMAL HIGH (ref 0.7–4.0)
MCH: 33.3 pg (ref 26.0–34.0)
MCHC: 33.3 g/dL (ref 30.0–36.0)
MCV: 100 fL (ref 80.0–100.0)
Monocytes Absolute: 0.4 10*3/uL (ref 0.1–1.0)
Monocytes Relative: 4 %
Neutro Abs: 3.7 10*3/uL (ref 1.7–7.7)
Neutrophils Relative %: 39 %
Platelets: 151 10*3/uL (ref 150–400)
RBC: 4.27 MIL/uL (ref 4.22–5.81)
RDW: 11.8 % (ref 11.5–15.5)
WBC: 9.5 10*3/uL (ref 4.0–10.5)
nRBC: 0 % (ref 0.0–0.2)

## 2024-01-19 LAB — LIPASE, BLOOD: Lipase: 36 U/L (ref 11–51)

## 2024-01-19 LAB — ETHANOL: Alcohol, Ethyl (B): 234 mg/dL — ABNORMAL HIGH (ref <15)

## 2024-01-19 LAB — TROPONIN I (HIGH SENSITIVITY): Troponin I (High Sensitivity): 8 ng/L (ref <18)

## 2024-01-19 MED ORDER — SODIUM CHLORIDE 0.9 % IV BOLUS
1000.0000 mL | Freq: Once | INTRAVENOUS | Status: AC
  Administered 2024-01-19: 1000 mL via INTRAVENOUS

## 2024-01-19 MED ORDER — POTASSIUM CHLORIDE 20 MEQ PO PACK: 40.0000 meq | PACK | Freq: Two times a day (BID) | ORAL | Status: DC

## 2024-01-19 MED ORDER — ALUM & MAG HYDROXIDE-SIMETH 200-200-20 MG/5ML PO SUSP
30.0000 mL | Freq: Once | ORAL | Status: AC
  Administered 2024-01-19: 30 mL via ORAL
  Filled 2024-01-19: qty 30

## 2024-01-19 MED ORDER — FAMOTIDINE 20 MG PO TABS
20.0000 mg | ORAL_TABLET | Freq: Once | ORAL | Status: AC
  Administered 2024-01-19: 20 mg via ORAL
  Filled 2024-01-19: qty 1

## 2024-01-19 NOTE — ED Notes (Signed)
 Pt yelling and demanding sandwich.

## 2024-01-19 NOTE — ED Triage Notes (Signed)
 [facility] reports pt coming the streets. Pt c/o chest pain. Pt states he drank a pint of liquor. Chest pain for the past three days. Pt has had a recent fall on his chest which he was evaluated for.

## 2024-01-19 NOTE — ED Notes (Signed)
 AMA form signed by pt, pt demanding to leave, demanding his IV be removed, demanding a [place] sandwich, provider was at bedside advising pt can not eat at this time, stated he could have a drink first, pt refusing and stating he was leaving and did not want to stay. Provider explained the risk to pt and pt stated he understood the risk.

## 2024-01-19 NOTE — ED Provider Notes (Signed)
 [facility] Provider Note    Event Date/Time   First MD Initiated Contact with Patient 01/19/24 1624     (approximate)   History   Chest Pain  [facility] reports pt coming the streets. Pt c/o chest pain. Pt states he drank a pint of liquor. Chest pain for the past three days. Pt has had a recent fall on his chest which he was evaluated for.    HPI Randall Harvey. is a 63 y.o. male PMH COPD, prior PE, prior stroke, prior subarachnoid hemorrhage (traumatic), polysubstance use/alcohol use, cancer (HCC), long-term anticoagulant use, type 2 diabetes presents for evaluation of possible chest pain -Patient tells me he was drinking, walking on the street, felt lightheaded and fell.  Denies any head strike, says he broke his fall with his arms.  Unclear if he did experience any chest pain, says he had some chest discomfort after a fall a few days ago does not have any new discomfort today. - Repeatedly requesting a sandwich and stating he would like to leave  Response to all questions appropriately, AO x 4.  Hypotensive on initial arrival that this normalized after IV fluid was given just prior to my eval, SBP low 100s at time of my eval.       Physical Exam   Triage Vital Signs: ED Triage Vitals [01/19/24 1615]  Encounter Vitals Group     BP (!) 80/61     Systolic BP Percentile      Diastolic BP Percentile      Pulse Rate 98     Resp 18     Temp      Temp Source Oral     SpO2 97 %     Weight      Height      Head Circumference      Peak Flow      Pain Score 9     Pain Loc      Pain Education      Exclude from Growth Chart     Most recent vital signs: Vitals:   01/19/24 1615  BP: (!) 80/61  Pulse: 98  Resp: 18  SpO2: 97%     General: Awake, no distress.  Appears somewhat intoxicated but responds all questions appropriately, able to express plan for self-care. CV:  Good peripheral perfusion. RRR, RP 2+ Resp:  Normal effort. CTAB Abd:  No  distention. Nontender to deep palpation throughout Neuro:  Responding all questions appropriately, no facial asymmetry, no focal motor deficit appreciated   ED Results / Procedures / Treatments   Labs (all labs ordered are listed, but only abnormal results are displayed) Labs Reviewed  CBC WITH DIFFERENTIAL/PLATELET - Abnormal; Notable for the following components:      Result Value   Lymphs Abs 5.3 (*)    All other components within normal limits  COMPREHENSIVE METABOLIC PANEL WITH GFR - Abnormal; Notable for the following components:   Potassium 3.2 (*)    CO2 17 (*)    Glucose, Bld 122 (*)    Calcium 8.2 (*)    Total Protein 6.2 (*)    Anion gap 16 (*)    All other components within normal limits  ETHANOL - Abnormal; Notable for the following components:   Alcohol, Ethyl (B) 234 (*)    All other components within normal limits  LIPASE, BLOOD  TROPONIN I (HIGH SENSITIVITY)  TROPONIN I (HIGH SENSITIVITY)     EKG  Ecg =  sinus rhythm, rate 90, no ST elevation or depression, right bundle branch block present.  No clear evidence of ischemia on my read.   RADIOLOGY Radiology interpreted by myself with no obvious acute pathology, radiology notes atelectasis or possible mild infiltrate.     PROCEDURES:  Critical Care performed: No  Procedures   MEDICATIONS ORDERED IN ED: Medications  potassium chloride  (KLOR-CON ) packet 40 mEq (has no administration in time range)  alum & mag hydroxide-simeth (MAALOX/MYLANTA) 200-200-20 MG/5ML suspension 30 mL (30 mLs Oral Given 01/19/24 1645)  famotidine  (PEPCID ) tablet 20 mg (20 mg Oral Given 01/19/24 1644)  sodium chloride  0.9 % bolus 1,000 mL (0 mLs Intravenous Stopped 01/19/24 1716)     IMPRESSION / MDM / ASSESSMENT AND PLAN / ED COURSE  I reviewed the triage vital signs and the nursing notes.                              DDX/MDM/AP: Differential diagnosis includes, but is not limited to, ACS, consider but doubt PE, consider  rib fracture or pneumothorax, doubt aortic dissection.  No obvious findings on initial exam, I clinically suspect intracranial hemorrhage or extremity or other fracture at this time.  Overall patient is somewhat intoxicated though per chart review is very frequently intoxicated at similar levels to what we are seeing today on his labs.  Was initially frustrated that we wanted to keep him n.p.o. but did eventually give him food when he threatened to leave.  After eating he decided he would still like to leave the hospital but I was able to convince him to stay for the results of his initial labs.  AO x 4, responds all questions appropriately, ambulates with steady gait, discussed possibility of missing dangerous pathology that could kill him if he were to leave the hospital before our workup and management is complete and he expresses full understanding of these risks.  Overall clinically sober enough for medical decision making here and able to express plan for self-care--called his sister to come pick him up.  Patient subsequently eloped from emergency department before labs were available.  Plan: - Eloped  Patient's presentation is most consistent with acute presentation with potential threat to life or bodily function.  The patient is on the cardiac monitor to evaluate for evidence of arrhythmia and/or significant heart rate changes.  ED course below.  Patient eloped from emergency department prior to workup being completed.  Did not meet IVC criteria on my eval.  Clinical Course as of 01/19/24 2050  Tue Jan 19, 2024  1712 Patient threatening to elope from the hospital.  Multiple prior presentations with intoxication.  Blood pressure had normalized after IV fluid at time of my initial eval.  AO x 4.  Able to express plan for self care, says he is asking his sister to come pick him up.  Ambulates with steady gait.  Discussed risk of leaving prior to full workup and understanding of medical  condition, possibility of death--patient expresses understanding.  Decided he is amenable to staying if he eats food.  Will wait for lab results. [MM]  1713 Trop wnl [MM]    Clinical Course User Index [MM] Collis Deaner, MD     FINAL CLINICAL IMPRESSION(S) / ED DIAGNOSES   Final diagnoses:  Fall, initial encounter  Chest pain, unspecified type  Alcohol abuse     Rx / DC Orders   ED Discharge Orders  None        Note:  This document was prepared using Dragon voice recognition software and may include unintentional dictation errors.   Collis Deaner, MD 01/19/24 [phone]

## 2024-03-04 ENCOUNTER — Other Ambulatory Visit: Payer: Self-pay

## 2024-03-04 ENCOUNTER — Emergency Department
Admission: EM | Admit: 2024-03-04 | Discharge: 2024-03-04 | Disposition: A | Discharge status: Home / Self Care | Attending: Emergency Medicine | Admitting: Emergency Medicine

## 2024-03-04 DIAGNOSIS — F10920 Alcohol use, unspecified with intoxication, uncomplicated: Secondary | ICD-10-CM

## 2024-03-04 DIAGNOSIS — Y908 Blood alcohol level of 240 mg/100 ml or more: Secondary | ICD-10-CM | POA: Insufficient documentation

## 2024-03-04 DIAGNOSIS — F10120 Alcohol abuse with intoxication, uncomplicated: Secondary | ICD-10-CM | POA: Diagnosis present

## 2024-03-04 LAB — COMPREHENSIVE METABOLIC PANEL WITH GFR
ALT: 25 U/L (ref 0–44)
AST: 39 U/L (ref 15–41)
Albumin: 3.5 g/dL (ref 3.5–5.0)
Alkaline Phosphatase: 69 U/L (ref 38–126)
Anion gap: 10 (ref 5–15)
BUN: 13 mg/dL (ref 8–23)
CO2: 22 mmol/L (ref 22–32)
Calcium: 7.8 mg/dL — ABNORMAL LOW (ref 8.9–10.3)
Chloride: 111 mmol/L (ref 98–111)
Creatinine, Ser: 0.86 mg/dL (ref 0.61–1.24)
GFR, Estimated: >60 mL/min (ref >60)
Glucose, Bld: 141 mg/dL — ABNORMAL HIGH (ref 70–99)
Potassium: 3.2 mmol/L — ABNORMAL LOW (ref 3.5–5.1)
Sodium: 143 mmol/L (ref 135–145)
Total Bilirubin: 1.2 mg/dL (ref 0.0–1.2)
Total Protein: 6.2 g/dL — ABNORMAL LOW (ref 6.5–8.1)

## 2024-03-04 LAB — CBC
HCT: 41.5 % (ref 39.0–52.0)
Hemoglobin: 13.8 g/dL (ref 13.0–17.0)
MCH: 32.3 pg (ref 26.0–34.0)
MCHC: 33.3 g/dL (ref 30.0–36.0)
MCV: 97.2 fL (ref 80.0–100.0)
Platelets: 134 10*3/uL — ABNORMAL LOW (ref 150–400)
RBC: 4.27 MIL/uL (ref 4.22–5.81)
RDW: 12 % (ref 11.5–15.5)
WBC: 12.5 10*3/uL — ABNORMAL HIGH (ref 4.0–10.5)
nRBC: 0 % (ref 0.0–0.2)

## 2024-03-04 LAB — ETHANOL: Alcohol, Ethyl (B): 354 mg/dL (ref <15)

## 2024-03-04 NOTE — ED Notes (Signed)
 The pt advised he was not waiting here for an hour in the room. The pt was ambulatory down the hall advising he was leaving. The exited the back door in flex without incident.

## 2024-03-04 NOTE — ED Provider Triage Note (Signed)
 Emergency Medicine Provider Triage Evaluation Note  Randall Harvey. , a 63 y.o. male  was evaluated in triage.  Pt complains of alcohol intoxication. Patient was brought in by EMS and doesn't understand why he is here. He has no complaints.   Review of Systems  Positive:  Negative: Pain  Physical Exam  There were no vitals taken for this visit. Gen:   Awake, no distress   Resp:  Normal effort  MSK:   Moves extremities without difficulty  Other:    Medical Decision Making  Medically screening exam initiated at 5:02 PM.  Appropriate orders placed.  Randall Harvey. was informed that the remainder of the evaluation will be completed by another provider, this initial triage assessment does not replace that evaluation, and the importance of remaining in the ED until their evaluation is complete.   Phyliss Breen, PA-C 03/04/24 1706

## 2024-03-04 NOTE — ED Triage Notes (Signed)
 Pt wanting leave and refusing vitals. PA called to do MSE.

## 2024-03-04 NOTE — ED Provider Notes (Signed)
 [facility] Provider Note   Event Date/Time   First MD Initiated Contact with Patient 03/04/24 1720     (approximate) History  Alcohol Intoxication  HPI Randall Harvey. is a 63 y.o. male with a stated past medical history of alcohol abuse who presents after police were called as patient was passed out on the sidewalk.  Patient has no complaints at this time.  Patient is ambulatory without difficulty.  Patient has mild slurred speech at this time.  Patient has no clinical concerns ROS: Patient currently denies any vision changes, tinnitus, difficulty speaking, facial droop, sore throat, chest pain, shortness of breath, abdominal pain, nausea/vomiting/diarrhea, dysuria, or weakness/numbness/paresthesias in any extremity   Physical Exam  Triage Vital Signs: ED Triage Vitals  Encounter Vitals Group     BP 03/04/24 1704 104/62     Systolic BP Percentile --      Diastolic BP Percentile --      Pulse Rate 03/04/24 1704 87     Resp 03/04/24 1704 20     Temp --      Temp src --      SpO2 03/04/24 1704 95 %     Weight --      Height --      Head Circumference --      Peak Flow --      Pain Score 03/04/24 1705 0     Pain Loc --      Pain Education --      Exclude from Growth Chart --    Most recent vital signs: Vitals:   03/04/24 1704  BP: 104/62  Pulse: 87  Resp: 20  SpO2: 95%   General: Awake, oriented x4.  Slurred speech CV:  Good peripheral perfusion. Resp:  Normal effort. Abd:  No distention. Other:  Ambulatory unassisted.  Elderly disheveled Caucasian male resting comfortably in no acute distress ED Results / Procedures / Treatments  Labs (all labs ordered are listed, but only abnormal results are displayed) Labs Reviewed  CBC - Abnormal; Notable for the following components:      Result Value   WBC 12.5 (*)    Platelets 134 (*)    All other components within normal limits  COMPREHENSIVE METABOLIC PANEL WITH GFR  ETHANOL   PROCEDURES: Critical Care performed: No Procedures MEDICATIONS ORDERED IN ED: Medications - No data to display IMPRESSION / MDM / ASSESSMENT AND PLAN / ED COURSE  I reviewed the triage vital signs and the nursing notes.                             The patient is on the cardiac monitor to evaluate for evidence of arrhythmia and/or significant heart rate changes. Patient's presentation is most consistent with acute presentation with potential threat to life or bodily function. Presents with altered mental status. +Slurred, sluggish behavior. Stated EtOH intoxication. Airway maintained. Unlikely intracranial bleed, opioid intoxication or coingestion, sepsis, hypothyroidism. Suspect likely transient course of intoxication with expected  improvement of symptoms as patient metabolizes offending agent.  Evaluation: Frequent mental status exams showed improving symptoms and evidence that the patient's AMS was secondary to intoxication. Pt able to ambulate without difficulty and PO tolerant. Plan DC home and return precautions. Disposition: Discharge home    FINAL CLINICAL IMPRESSION(S) / ED DIAGNOSES   Final diagnoses:  Alcoholic intoxication without complication (HCC)   Rx / DC Orders   ED Discharge Orders  None      Note:  This document was prepared using Dragon voice recognition software and may include unintentional dictation errors.   Selam Pietsch K, MD 03/04/24 [phone]

## 2024-03-04 NOTE — ED Triage Notes (Addendum)
 First nurse note: Pt to ED via [facility] from the road. EMS called by [employer] due to pt being passed out on sidewalk. Pt with etoh on board. Pt reports drank about a 5th of unknown alcohol   106/62 CBG 161 EKG showed bundle branch

## 2024-03-10 ENCOUNTER — Emergency Department
Admission: EM | Admit: 2024-03-10 | Discharge: 2024-03-11 | Disposition: A | Discharge status: Home / Self Care | Attending: Emergency Medicine | Admitting: Emergency Medicine

## 2024-03-10 ENCOUNTER — Emergency Department

## 2024-03-10 ENCOUNTER — Other Ambulatory Visit: Payer: Self-pay

## 2024-03-10 DIAGNOSIS — F10129 Alcohol abuse with intoxication, unspecified: Secondary | ICD-10-CM | POA: Diagnosis not present

## 2024-03-10 DIAGNOSIS — R000 Tachycardia, unspecified: Secondary | ICD-10-CM | POA: Insufficient documentation

## 2024-03-10 DIAGNOSIS — R0789 Other chest pain: Secondary | ICD-10-CM | POA: Insufficient documentation

## 2024-03-10 DIAGNOSIS — R42 Dizziness and giddiness: Secondary | ICD-10-CM | POA: Diagnosis present

## 2024-03-10 DIAGNOSIS — Y908 Blood alcohol level of 240 mg/100 ml or more: Secondary | ICD-10-CM | POA: Insufficient documentation

## 2024-03-10 DIAGNOSIS — F10929 Alcohol use, unspecified with intoxication, unspecified: Secondary | ICD-10-CM

## 2024-03-10 LAB — CBC
HCT: 44.1 % (ref 39.0–52.0)
Hemoglobin: 15.3 g/dL (ref 13.0–17.0)
MCH: 33 pg (ref 26.0–34.0)
MCHC: 34.7 g/dL (ref 30.0–36.0)
MCV: 95.2 fL (ref 80.0–100.0)
Platelets: 113 10*3/uL — ABNORMAL LOW (ref 150–400)
RBC: 4.63 MIL/uL (ref 4.22–5.81)
RDW: 12.5 % (ref 11.5–15.5)
WBC: 13.7 10*3/uL — ABNORMAL HIGH (ref 4.0–10.5)
nRBC: 0 % (ref 0.0–0.2)

## 2024-03-10 LAB — COMPREHENSIVE METABOLIC PANEL WITH GFR
ALT: 14 U/L (ref 0–44)
AST: 21 U/L (ref 15–41)
Albumin: 3.7 g/dL (ref 3.5–5.0)
Alkaline Phosphatase: 80 U/L (ref 38–126)
Anion gap: 15 (ref 5–15)
BUN: 5 mg/dL — ABNORMAL LOW (ref 8–23)
CO2: 20 mmol/L — ABNORMAL LOW (ref 22–32)
Calcium: 8.2 mg/dL — ABNORMAL LOW (ref 8.9–10.3)
Chloride: 107 mmol/L (ref 98–111)
Creatinine, Ser: 0.62 mg/dL (ref 0.61–1.24)
GFR, Estimated: >60 mL/min (ref >60)
Glucose, Bld: 133 mg/dL — ABNORMAL HIGH (ref 70–99)
Potassium: 2.9 mmol/L — ABNORMAL LOW (ref 3.5–5.1)
Sodium: 142 mmol/L (ref 135–145)
Total Bilirubin: 1.1 mg/dL (ref 0.0–1.2)
Total Protein: 6.7 g/dL (ref 6.5–8.1)

## 2024-03-10 LAB — TROPONIN I (HIGH SENSITIVITY): Troponin I (High Sensitivity): 8 ng/L (ref <18)

## 2024-03-10 LAB — ETHANOL: Alcohol, Ethyl (B): 262 mg/dL — ABNORMAL HIGH (ref <15)

## 2024-03-10 MED ORDER — POTASSIUM CHLORIDE CRYS ER 20 MEQ PO TBCR
40.0000 meq | EXTENDED_RELEASE_TABLET | Freq: Once | ORAL | Status: AC
  Administered 2024-03-10: 40 meq via ORAL
  Filled 2024-03-10: qty 2

## 2024-03-10 NOTE — Discharge Instructions (Signed)
 As we discussed, your medical workup was reassuring, and we think the primary issue with which you are dealing is your dependence on alcohol.  Please consider cutting back on the alcohol that you drink every day, but do not stop completely, as this can be dangerous.  Please call your regular doctor, [facility], or [facility] at the numbers listed in this paperwork for help with your addiction, and to follow-up with your primary care doctor regarding your chest pain.    Return to the emergency department if you develop new or worsening symptoms that concern you.

## 2024-03-10 NOTE — ED Triage Notes (Signed)
 Pt reports chest pain that began 2 hours ago, pt denies cardiac hx. Pt reports he is also dizzy.

## 2024-03-10 NOTE — ED Provider Notes (Signed)
 [facility] Provider Note    Event Date/Time   First MD Initiated Contact with Patient 03/10/24 2254     (approximate)   History   Chest Pain   HPI Randall Harvey. is a 63 y.o. male well-known to the emergency department with this being his ninth ED visit within the last 6 months.  He has had multiple recent visits typically for alcohol intoxication but also frequently for chest pain.  He presents tonight again with reports of chest pain.  He said he does not know why he feels dizzy a lot of the time, both when he drinks and when he does not.  No recent vomiting.  Denies fever.  Describes the pain as intermittent on the left side of his chest.  He says he has had some falls recently and he has been here and evaluated for them but he said that sometimes his chest hurts and when it does it scares him.  He denies seizures.  He said he does not think he is that intoxicated tonight and drank only 1 bottle of liquor.     Physical Exam   Triage Vital Signs: ED Triage Vitals  Encounter Vitals Group     BP 03/10/24 2147 103/75     Girls Systolic BP Percentile --      Girls Diastolic BP Percentile --      Boys Systolic BP Percentile --      Boys Diastolic BP Percentile --      Pulse Rate 03/10/24 2147 (!) 105     Resp 03/10/24 2147 18     Temp 03/10/24 2147 98.3 F (36.8 C)     Temp src --      SpO2 03/10/24 2147 100 %     Weight 03/10/24 2146 73 kg (161 lb)     Height 03/10/24 2146 1.702 m (5' 7)     Head Circumference --      Peak Flow --      Pain Score 03/10/24 2146 5     Pain Loc --      Pain Education --      Exclude from Growth Chart --     Most recent vital signs: Vitals:   03/10/24 2330 03/10/24 2341  BP: 104/82   Pulse: 86 87  Resp: (!) 21 18  Temp:    SpO2: 98% 100%    General: Awake, somewhat disheveled but not severely so, alert and communicative. CV:  Good peripheral perfusion.  Initially tachycardic, resolved once he is in a room.   Normal heart sounds, regular rate and rhythm. Resp:  Normal effort. Speaking easily and comfortably, no accessory muscle usage nor intercostal retractions.  Lungs clear to auscultation. Abd:  No distention.  No tenderness to palpation of the abdomen. Other:  Though the patient is technically intoxicated by lab work, he is ambulatory without requiring assistance and not even significantly slurring his words.   ED Results / Procedures / Treatments   Labs (all labs ordered are listed, but only abnormal results are displayed) Labs Reviewed  COMPREHENSIVE METABOLIC PANEL WITH GFR - Abnormal; Notable for the following components:      Result Value   Potassium 2.9 (*)    CO2 20 (*)    Glucose, Bld 133 (*)    BUN 5 (*)    Calcium 8.2 (*)    All other components within normal limits  CBC - Abnormal; Notable for the following components:   WBC  13.7 (*)    Platelets 113 (*)    All other components within normal limits  ETHANOL - Abnormal; Notable for the following components:   Alcohol, Ethyl (B) 262 (*)    All other components within normal limits  TROPONIN I (HIGH SENSITIVITY)     EKG  ED ECG REPORT I, Lynnda Sas, the attending physician, personally viewed and interpreted this ECG.  Date: 03/10/2024 EKG Time: 21: 48 Rate: 103 Rhythm: Sinus tachycardia QRS Axis: normal Intervals: Right bundle branch block ST/T Wave abnormalities: Non-specific ST segment / T-wave changes, but no clear evidence of acute ischemia. Narrative Interpretation: no definitive evidence of acute ischemia; does not meet STEMI criteria.    RADIOLOGY I independently viewed and interpreted the patient's CXR, and there is no evidence of pneumonia nor pneumothorax.  I also read the radiologist's report, which confirmed no acute findings.   PROCEDURES:  Critical Care performed: No  .1-3 Lead EKG Interpretation  Performed by: Lynnda Sas, MD Authorized by: Lynnda Sas, MD     Interpretation:  normal     ECG rate:  87   ECG rate assessment: normal     Rhythm: sinus rhythm     Ectopy: none     Conduction: normal       IMPRESSION / MDM / ASSESSMENT AND PLAN / ED COURSE  I reviewed the triage vital signs and the nursing notes.                              Differential diagnosis includes, but is not limited to, alcohol intoxication, nonspecific/atypical chest pain, ACS, pneumonia, rib fracture, pneumothorax, less likely PE.  Patient's presentation is most consistent with acute presentation with potential threat to life or bodily function.  Labs/studies ordered: EKG, chest x-ray, high-sensitivity troponin, ethanol level, CMP, CBC  Interventions/Medications given:  Medications  potassium chloride  SA (KLOR-CON  M) CR tablet 40 mEq (40 mEq Oral Given 03/10/24 2349)    (Note:  hospital course my include additional interventions and/or labs/studies not listed above.)   Reassuring vital signs, well score for PE of 0, low risk ACS based on HEAR score.  I also looked back through the medical record and see he has had multiple cardiac workups over the last few months.  His high-sensitivity troponin is negative and he had pain starting hours before the initial blood draw, there is no indication to repeat another 1.  No ischemia on EKG and normal chest x-ray.  I provided reassurance and talked with him about his alcohol abuse and how this is likely contributing to all of his symptoms.  He did not realize how much of the problem his alcohol seems to be.  I am giving him outpatient resources and encouraged him to follow-up.  The patient was on the cardiac monitor to evaluate for evidence of arrhythmia and/or significant heart rate changes.    The patient's medical screening exam is reassuring with no indication of an emergent medical condition requiring hospitalization or additional evaluation at this point.  The patient is safe and appropriate for discharge and outpatient follow up.      FINAL CLINICAL IMPRESSION(S) / ED DIAGNOSES   Final diagnoses:  Atypical chest pain  Alcoholic intoxication with complication (HCC)     Rx / DC Orders   ED Discharge Orders     None        Note:  This document was prepared using Dragon voice recognition software  and may include unintentional dictation errors.   Lynnda Sas, MD 03/11/24 [phone]

## 2024-03-10 NOTE — ED Provider Notes (Incomplete)
 [facility] Provider Note    Event Date/Time   First MD Initiated Contact with Patient 03/10/24 2254     (approximate)   History   Chest Pain   HPI Randall Harvey. is a 63 y.o. male well-known to the emergency department with this being his ninth ED visit within the last 6 months.  He has had multiple recent visits typically for alcohol intoxication but also frequently for chest pain.  He presents tonight again with reports of chest pain.  He said he does not know why he feels dizzy a lot of the time, both when he drinks and when he does not.  No recent vomiting.  Denies fever.  Describes the pain as intermittent on the left side of his chest.  He says he has had some falls recently and he has been here and evaluated for them but he said that sometimes his chest hurts and when it does it scares him.  He denies seizures.  He said he does not think he is that intoxicated tonight and drank only 1 bottle of liquor.     Physical Exam   Triage Vital Signs: ED Triage Vitals  Encounter Vitals Group     BP 03/10/24 2147 103/75     Girls Systolic BP Percentile --      Girls Diastolic BP Percentile --      Boys Systolic BP Percentile --      Boys Diastolic BP Percentile --      Pulse Rate 03/10/24 2147 (!) 105     Resp 03/10/24 2147 18     Temp 03/10/24 2147 98.3 F (36.8 C)     Temp src --      SpO2 03/10/24 2147 100 %     Weight 03/10/24 2146 73 kg (161 lb)     Height 03/10/24 2146 1.702 m (5' 7)     Head Circumference --      Peak Flow --      Pain Score 03/10/24 2146 5     Pain Loc --      Pain Education --      Exclude from Growth Chart --     Most recent vital signs: Vitals:   03/10/24 2147  BP: 103/75  Pulse: (!) 105  Resp: 18  Temp: 98.3 F (36.8 C)  SpO2: 100%    General: Awake, somewhat disheveled but not severely so, alert and communicative. CV:  Good peripheral perfusion.  Initially tachycardic, resolved once he is in a room.  Normal  heart sounds, regular rate and rhythm. Resp:  Normal effort. Speaking easily and comfortably, no accessory muscle usage nor intercostal retractions.  Lungs clear to auscultation. Abd:  No distention.  No tenderness to palpation of the abdomen. Other:  Though the patient is technically intoxicated by lab work, he is ambulatory without requiring assistance and not even significantly slurring his words.   ED Results / Procedures / Treatments   Labs (all labs ordered are listed, but only abnormal results are displayed) Labs Reviewed  COMPREHENSIVE METABOLIC PANEL WITH GFR - Abnormal; Notable for the following components:      Result Value   Potassium 2.9 (*)    CO2 20 (*)    Glucose, Bld 133 (*)    BUN 5 (*)    Calcium 8.2 (*)    All other components within normal limits  CBC - Abnormal; Notable for the following components:   WBC 13.7 (*)  Platelets 113 (*)    All other components within normal limits  ETHANOL - Abnormal; Notable for the following components:   Alcohol, Ethyl (B) 262 (*)    All other components within normal limits  TROPONIN I (HIGH SENSITIVITY)     EKG  ED ECG REPORT I, Lynnda Sas, the attending physician, personally viewed and interpreted this ECG.  Date: 03/10/2024 EKG Time: 21: 48 Rate: 103 Rhythm: Sinus tachycardia QRS Axis: normal Intervals: Right bundle branch block ST/T Wave abnormalities: Non-specific ST segment / T-wave changes, but no clear evidence of acute ischemia. Narrative Interpretation: no definitive evidence of acute ischemia; does not meet STEMI criteria.    RADIOLOGY I independently viewed and interpreted the patient's CXR, and there is no evidence of pneumonia nor pneumothorax.  I also read the radiologist's report, which confirmed no acute findings.   PROCEDURES:  Critical Care performed: No  .1-3 Lead EKG Interpretation  Performed by: Lynnda Sas, MD Authorized by: Lynnda Sas, MD     Interpretation: normal      ECG rate:  87   ECG rate assessment: normal     Rhythm: sinus rhythm     Ectopy: none     Conduction: normal       IMPRESSION / MDM / ASSESSMENT AND PLAN / ED COURSE  I reviewed the triage vital signs and the nursing notes.                              Differential diagnosis includes, but is not limited to, alcohol intoxication, nonspecific/atypical chest pain, ACS, pneumonia, rib fracture, pneumothorax, less likely PE.  Patient's presentation is most consistent with acute presentation with potential threat to life or bodily function.  Labs/studies ordered: EKG, chest x-ray, high-sensitivity troponin, ethanol level, CMP, CBC  Interventions/Medications given:  Medications - No data to display  (Note:  hospital course my include additional interventions and/or labs/studies not listed above.)   Reassuring vital signs, well score for PE of 0, low risk ACS based on HEAR score.  I also looked back through the medical record and see he has had multiple cardiac workups over the last few months.  His high-sensitivity troponin is negative and he had pain starting hours before the initial blood draw, there is no indication to repeat another 1.  No ischemia on EKG and normal chest x-ray.  I provided reassurance and talked with him about his alcohol abuse and how this is likely contributing to all of his symptoms.  He did not realize how much of the problem his alcohol seems to be.  I am giving him outpatient resources and encouraged him to follow-up.  The patient was on the cardiac monitor to evaluate for evidence of arrhythmia and/or significant heart rate changes.    The patient's medical screening exam is reassuring with no indication of an emergent medical condition requiring hospitalization or additional evaluation at this point.  The patient is safe and appropriate for discharge and outpatient follow up.     FINAL CLINICAL IMPRESSION(S) / ED DIAGNOSES   Final diagnoses:  None      Rx / DC Orders   ED Discharge Orders     None        Note:  This document was prepared using Dragon voice recognition software and may include unintentional dictation errors.

## 2024-03-10 NOTE — ED Triage Notes (Signed)
 EMS brings pt in from home for c/o CP x 2hrs; +ETOH

## 2024-03-19 ENCOUNTER — Encounter: Payer: Self-pay | Admitting: Emergency Medicine

## 2024-03-19 ENCOUNTER — Other Ambulatory Visit: Payer: Self-pay

## 2024-03-19 ENCOUNTER — Emergency Department
Admission: EM | Admit: 2024-03-19 | Discharge: 2024-03-19 | Discharge status: Against Medical Advice | Attending: Emergency Medicine | Admitting: Emergency Medicine

## 2024-03-19 DIAGNOSIS — Z5329 Procedure and treatment not carried out because of patient's decision for other reasons: Secondary | ICD-10-CM | POA: Diagnosis not present

## 2024-03-19 DIAGNOSIS — F10920 Alcohol use, unspecified with intoxication, uncomplicated: Secondary | ICD-10-CM | POA: Diagnosis present

## 2024-03-19 LAB — TROPONIN I (HIGH SENSITIVITY): Troponin I (High Sensitivity): 8 ng/L (ref <18)

## 2024-03-19 LAB — CBC WITH DIFFERENTIAL/PLATELET
Abs Immature Granulocytes: 0.03 10*3/uL (ref 0.00–0.07)
Basophils Absolute: 0.1 10*3/uL (ref 0.0–0.1)
Basophils Relative: 1 %
Eosinophils Absolute: 0.1 10*3/uL (ref 0.0–0.5)
Eosinophils Relative: 1 %
HCT: 44.5 % (ref 39.0–52.0)
Hemoglobin: 15.1 g/dL (ref 13.0–17.0)
Immature Granulocytes: 0 %
Lymphocytes Relative: 73 %
Lymphs Abs: 10.1 10*3/uL — ABNORMAL HIGH (ref 0.7–4.0)
MCH: 33 pg (ref 26.0–34.0)
MCHC: 33.9 g/dL (ref 30.0–36.0)
MCV: 97.4 fL (ref 80.0–100.0)
Monocytes Absolute: 0.4 10*3/uL (ref 0.1–1.0)
Monocytes Relative: 3 %
Neutro Abs: 3.1 10*3/uL (ref 1.7–7.7)
Neutrophils Relative %: 22 %
Platelets: 119 10*3/uL — ABNORMAL LOW (ref 150–400)
RBC: 4.57 MIL/uL (ref 4.22–5.81)
RDW: 13.4 % (ref 11.5–15.5)
Smear Review: NORMAL
WBC: 13.7 10*3/uL — ABNORMAL HIGH (ref 4.0–10.5)
nRBC: 0.3 % — ABNORMAL HIGH (ref 0.0–0.2)

## 2024-03-19 LAB — BASIC METABOLIC PANEL WITH GFR
Anion gap: 11 (ref 5–15)
BUN: 18 mg/dL (ref 8–23)
CO2: 24 mmol/L (ref 22–32)
Calcium: 8.3 mg/dL — ABNORMAL LOW (ref 8.9–10.3)
Chloride: 108 mmol/L (ref 98–111)
Creatinine, Ser: 0.88 mg/dL (ref 0.61–1.24)
GFR, Estimated: >60 mL/min (ref >60)
Glucose, Bld: 135 mg/dL — ABNORMAL HIGH (ref 70–99)
Potassium: 3.7 mmol/L (ref 3.5–5.1)
Sodium: 143 mmol/L (ref 135–145)

## 2024-03-19 NOTE — ED Notes (Signed)
 Pt removed fall risk bracelet from wrist.

## 2024-03-19 NOTE — ED Triage Notes (Signed)
 Pt BIB [facility] from the street with reports of drinking wine and being intoxicated. Pt endorses chest pain.

## 2024-03-19 NOTE — ED Provider Notes (Signed)
 [facility] Provider Note    Event Date/Time   First MD Initiated Contact with Patient 03/19/24 1704     (approximate)   History   Alcohol Intoxication   HPI  Randall Harvey. is a 63 y.o. male who is brought to the emergency department via EMS after being found on the side of the road.  Patient states he has drunk alcohol today.  He he got weak and fell over.  He denies any injury from the pain saying that he fell into a tall grass.  He denies any recent fevers.  He states that he wished they would have just let him walk home.     Physical Exam   Triage Vital Signs: ED Triage Vitals  Encounter Vitals Group     BP 03/19/24 1707 128/82     Girls Systolic BP Percentile --      Girls Diastolic BP Percentile --      Boys Systolic BP Percentile --      Boys Diastolic BP Percentile --      Pulse Rate 03/19/24 1707 (!) 101     Resp 03/19/24 1707 16     Temp --      Temp src --      SpO2 03/19/24 1707 100 %     Weight --      Height --      Head Circumference --      Peak Flow --      Pain Score 03/19/24 1741 0     Pain Loc --      Pain Education --      Exclude from Growth Chart --     Most recent vital signs: Vitals:   03/19/24 1707  BP: 128/82  Pulse: (!) 101  Resp: 16  SpO2: 100%   General: Awake, alert, oriented. CV:  Good peripheral perfusion. Regular rate and rhythm. Resp:  Normal effort. Lungs clear. Abd:  No distention.  Other:  No hematoma or bruising to face or scalp appreciated.    ED Results / Procedures / Treatments   Labs (all labs ordered are listed, but only abnormal results are displayed) Labs Reviewed  CBC WITH DIFFERENTIAL/PLATELET - Abnormal; Notable for the following components:      Result Value   WBC 13.7 (*)    Platelets 119 (*)    nRBC 0.3 (*)    Lymphs Abs 10.1 (*)    All other components within normal limits  BASIC METABOLIC PANEL WITH GFR - Abnormal; Notable for the following components:   Glucose,  Bld 135 (*)    Calcium 8.3 (*)    All other components within normal limits  TROPONIN I (HIGH SENSITIVITY)     EKG  I, Guadalupe Eagles, attending physician, personally viewed and interpreted this EKG  EKG Time: 1708 Rate: 97 Rhythm: normal sinus rhythm Axis: normal Intervals: qtc 477 QRS: RBBB ST changes: no st elevation Impression: abnormal ekg   RADIOLOGY None   PROCEDURES:  Critical Care performed: No   MEDICATIONS ORDERED IN ED: Medications - No data to display   IMPRESSION / MDM / ASSESSMENT AND PLAN / ED COURSE  I reviewed the triage vital signs and the nursing notes.                              Differential diagnosis includes, but is not limited to, alcohol intoxication, anemia, electrolyte abnormality, arrhythmia  Patient's presentation is most consistent with acute presentation with potential threat to life or bodily function.   Patient presents to the emergency department today after a fall.  Patient says that he was drinking alcohol and fell after getting weak.  He denies any traumatic injury and no obvious traumatic injury identified on exam.  Did offer to check blood work for patient initially patient was willing to stay for that.  Blood work shows slight leukocytosis.  However prior to being able to discuss findings with patient he chose to leave.  He was able to ambulate easily out of the emergency department.   FINAL CLINICAL IMPRESSION(S) / ED DIAGNOSES   Final diagnoses:  Alcoholic intoxication without complication (HCC)        Note:  This document was prepared using Dragon voice recognition software and may include unintentional dictation errors.    Floy Roberts, MD 03/19/24 [phone]

## 2024-03-19 NOTE — ED Notes (Signed)
 Pt seen walking to the lobby. Notified Dr. Floy.

## 2024-03-19 NOTE — ED Notes (Signed)
 Pt requesting to get out of bed and stand at the bedside. Pt instructed on the importance of staying in the bed due to his mental state and instability when ambulating. Pt refused to stay in the bed. Pt placed on posey box, fall socks, and arm band at this time.

## 2024-03-28 ENCOUNTER — Emergency Department
Admission: EM | Admit: 2024-03-28 | Discharge: 2024-03-28 | Disposition: A | Discharge status: Home / Self Care | Attending: Emergency Medicine | Admitting: Emergency Medicine

## 2024-03-28 ENCOUNTER — Emergency Department

## 2024-03-28 ENCOUNTER — Other Ambulatory Visit: Payer: Self-pay

## 2024-03-28 DIAGNOSIS — J449 Chronic obstructive pulmonary disease, unspecified: Secondary | ICD-10-CM | POA: Insufficient documentation

## 2024-03-28 DIAGNOSIS — Y907 Blood alcohol level of 200-239 mg/100 ml: Secondary | ICD-10-CM | POA: Insufficient documentation

## 2024-03-28 DIAGNOSIS — F10120 Alcohol abuse with intoxication, uncomplicated: Secondary | ICD-10-CM | POA: Diagnosis present

## 2024-03-28 DIAGNOSIS — R079 Chest pain, unspecified: Secondary | ICD-10-CM

## 2024-03-28 DIAGNOSIS — R0789 Other chest pain: Secondary | ICD-10-CM | POA: Insufficient documentation

## 2024-03-28 DIAGNOSIS — I10 Essential (primary) hypertension: Secondary | ICD-10-CM | POA: Diagnosis not present

## 2024-03-28 DIAGNOSIS — F10920 Alcohol use, unspecified with intoxication, uncomplicated: Secondary | ICD-10-CM

## 2024-03-28 LAB — BASIC METABOLIC PANEL WITH GFR
Anion gap: 15 (ref 5–15)
BUN: 12 mg/dL (ref 8–23)
CO2: 22 mmol/L (ref 22–32)
Calcium: 9.2 mg/dL (ref 8.9–10.3)
Chloride: 105 mmol/L (ref 98–111)
Creatinine, Ser: 0.96 mg/dL (ref 0.61–1.24)
GFR, Estimated: >60 mL/min (ref >60)
Glucose, Bld: 151 mg/dL — ABNORMAL HIGH (ref 70–99)
Potassium: 3 mmol/L — ABNORMAL LOW (ref 3.5–5.1)
Sodium: 142 mmol/L (ref 135–145)

## 2024-03-28 LAB — CBC
HCT: 45.4 % (ref 39.0–52.0)
Hemoglobin: 15.4 g/dL (ref 13.0–17.0)
MCH: 33.3 pg (ref 26.0–34.0)
MCHC: 33.9 g/dL (ref 30.0–36.0)
MCV: 98.1 fL (ref 80.0–100.0)
Platelets: 152 10*3/uL (ref 150–400)
RBC: 4.63 MIL/uL (ref 4.22–5.81)
RDW: 14.6 % (ref 11.5–15.5)
WBC: 11.3 10*3/uL — ABNORMAL HIGH (ref 4.0–10.5)
nRBC: 0 % (ref 0.0–0.2)

## 2024-03-28 LAB — ETHANOL: Alcohol, Ethyl (B): 217 mg/dL — ABNORMAL HIGH (ref <15)

## 2024-03-28 LAB — TROPONIN I (HIGH SENSITIVITY): Troponin I (High Sensitivity): 8 ng/L (ref <18)

## 2024-03-28 NOTE — ED Provider Notes (Signed)
 [facility] Provider Note    Event Date/Time   First MD Initiated Contact with Patient 03/28/24 1557     (approximate)  History   Chief Complaint: Chest Pain and Alcohol Intoxication  HPI  Randall Harvey. is a 63 y.o. male with a past medical history of alcohol abuse, COPD, anxiety, hypertension, hyperlipidemia, presents to the emergency department for alcohol intoxication and chest pain.  According to the patient he admits drinking a significant mount of alcohol today, states he was in the shade he says he must of fallen asleep.  Next thing he knows he has being woken up by police and EMS and brought to the emergency department.  Patient states he was just laying down, when I asked him if anything was wrong he says his chest has been hurting him for the last few days so they brought him here for evaluation.  Patient continues states slight chest discomfort but states has been going on for quite some time per patient.  Physical Exam   Triage Vital Signs: ED Triage Vitals  Encounter Vitals Group     BP 03/28/24 1558 113/63     Girls Systolic BP Percentile --      Girls Diastolic BP Percentile --      Boys Systolic BP Percentile --      Boys Diastolic BP Percentile --      Pulse Rate 03/28/24 1558 96     Resp 03/28/24 1558 18     Temp 03/28/24 1558 97.9 F (36.6 C)     Temp Source 03/28/24 1558 Oral     SpO2 03/28/24 1558 97 %     Weight 03/28/24 1559 160 lb 15 oz (73 kg)     Height 03/28/24 1559 5' 7 (1.702 m)     Head Circumference --      Peak Flow --      Pain Score 03/28/24 1559 5     Pain Loc --      Pain Education --      Exclude from Growth Chart --     Most recent vital signs: Vitals:   03/28/24 1558  BP: 113/63  Pulse: 96  Resp: 18  Temp: 97.9 F (36.6 C)  SpO2: 97%    General: Awake, no distress.  CV:  Good peripheral perfusion.  Regular rate and rhythm  Resp:  Normal effort.  Equal breath sounds bilaterally.  Abd:  No  distention.  Soft, nontender.  No rebound or guarding.  ED Results / Procedures / Treatments   EKG  EKG viewed and interpreted by myself shows a normal sinus rhythm 83 bpm with a widened QRS, normal axis, normal intervals, nonspecific ST changes.  RADIOLOGY  I have reviewed interpret the chest x-ray images.  No consolidation on my evaluation. Patient's chest x-ray read as negative for acute process.  MEDICATIONS ORDERED IN ED: Medications - No data to display   IMPRESSION / MDM / ASSESSMENT AND PLAN / ED COURSE  I reviewed the triage vital signs and the nursing notes.  Patient's presentation is most consistent with acute presentation with potential threat to life or bodily function.  Patient presents to the emergency department for alcohol intoxication and chest pain.  Patient admits to drinking a significant amount of alcohol today, states he was lying in the shade and must of fallen asleep.  States he does not know who called EMS/police but they awoke him and brought him here for evaluation.  Patient  states he has been experiencing some chest discomfort for quite some time.  We will check labs, EKG and a chest x-ray as a precaution.  Will check an ethanol level and IV hydrate while awaiting results.  Patient is asking for something to eat and drink.  Patient's workup shows a reassuring CBC, reassuring chemistry.  Alcohol level is elevated to 217.  Patient's troponin is negative.  Patient has eaten and drinking.  Patient is ambulating without issue.  Patient is adamant that he be discharged from the emergency department, states he did not want to be here in the first place but EMS made him come.  Given the patient's reassuring workup I believe the patient would be safe for discharge home per his wishes.  FINAL CLINICAL IMPRESSION(S) / ED DIAGNOSES   Alcohol intoxication Chest pain   Note:  This document was prepared using Dragon voice recognition software and may include  unintentional dictation errors.   Dorothyann Drivers, MD 03/28/24 [phone]

## 2024-03-28 NOTE — ED Notes (Signed)
Pt given a sandwich tray and ice water.

## 2024-03-28 NOTE — Discharge Instructions (Signed)
 As we discussed please limit your alcohol intake.  Please follow-up with your doctor regarding your intermittent recent chest pain.  Return to the emergency department for any worsening chest pain, trouble breathing or any other symptom personally concerning to yourself.

## 2024-03-28 NOTE — ED Notes (Signed)
 Provided patient with second sandwich tray.

## 2024-03-28 NOTE — ED Triage Notes (Signed)
 Pt presents to ED via [facility] from a local parking lot where pt was sitting on the ground. Pt is intoxicated at this time.   Pt states he CP because of his broken heart. Pt states my girl was recently locked up. Pt states he is hungry.  Pt denies radiation of pain.

## 2024-03-30 ENCOUNTER — Emergency Department
Admission: EM | Admit: 2024-03-30 | Discharge: 2024-03-31 | Disposition: A | Discharge status: Other Acute Inpatient Hospital | Attending: Emergency Medicine | Admitting: Emergency Medicine

## 2024-03-30 ENCOUNTER — Other Ambulatory Visit: Payer: Self-pay

## 2024-03-30 DIAGNOSIS — Y908 Blood alcohol level of 240 mg/100 ml or more: Secondary | ICD-10-CM | POA: Diagnosis not present

## 2024-03-30 DIAGNOSIS — R45851 Suicidal ideations: Secondary | ICD-10-CM | POA: Insufficient documentation

## 2024-03-30 DIAGNOSIS — F17200 Nicotine dependence, unspecified, uncomplicated: Secondary | ICD-10-CM | POA: Insufficient documentation

## 2024-03-30 DIAGNOSIS — Z7901 Long term (current) use of anticoagulants: Secondary | ICD-10-CM | POA: Insufficient documentation

## 2024-03-30 DIAGNOSIS — R4781 Slurred speech: Secondary | ICD-10-CM | POA: Diagnosis present

## 2024-03-30 DIAGNOSIS — Z85528 Personal history of other malignant neoplasm of kidney: Secondary | ICD-10-CM | POA: Diagnosis not present

## 2024-03-30 DIAGNOSIS — I10 Essential (primary) hypertension: Secondary | ICD-10-CM | POA: Insufficient documentation

## 2024-03-30 DIAGNOSIS — F332 Major depressive disorder, recurrent severe without psychotic features: Secondary | ICD-10-CM | POA: Diagnosis not present

## 2024-03-30 DIAGNOSIS — F10129 Alcohol abuse with intoxication, unspecified: Secondary | ICD-10-CM | POA: Diagnosis present

## 2024-03-30 DIAGNOSIS — F10920 Alcohol use, unspecified with intoxication, uncomplicated: Secondary | ICD-10-CM

## 2024-03-30 DIAGNOSIS — J449 Chronic obstructive pulmonary disease, unspecified: Secondary | ICD-10-CM | POA: Diagnosis not present

## 2024-03-30 LAB — BASIC METABOLIC PANEL WITH GFR
Anion gap: 10 (ref 5–15)
BUN: 15 mg/dL (ref 8–23)
CO2: 21 mmol/L — ABNORMAL LOW (ref 22–32)
Calcium: 8.3 mg/dL — ABNORMAL LOW (ref 8.9–10.3)
Chloride: 110 mmol/L (ref 98–111)
Creatinine, Ser: 0.99 mg/dL (ref 0.61–1.24)
GFR, Estimated: >60 mL/min (ref >60)
Glucose, Bld: 215 mg/dL — ABNORMAL HIGH (ref 70–99)
Potassium: 2.9 mmol/L — ABNORMAL LOW (ref 3.5–5.1)
Sodium: 141 mmol/L (ref 135–145)

## 2024-03-30 LAB — ETHANOL: Alcohol, Ethyl (B): 272 mg/dL — ABNORMAL HIGH (ref <15)

## 2024-03-30 LAB — CBC
HCT: 38.8 % — ABNORMAL LOW (ref 39.0–52.0)
Hemoglobin: 13.4 g/dL (ref 13.0–17.0)
MCH: 34.4 pg — ABNORMAL HIGH (ref 26.0–34.0)
MCHC: 34.5 g/dL (ref 30.0–36.0)
MCV: 99.7 fL (ref 80.0–100.0)
Platelets: 132 10*3/uL — ABNORMAL LOW (ref 150–400)
RBC: 3.89 MIL/uL — ABNORMAL LOW (ref 4.22–5.81)
RDW: 14.8 % (ref 11.5–15.5)
WBC: 11.1 10*3/uL — ABNORMAL HIGH (ref 4.0–10.5)
nRBC: 0 % (ref 0.0–0.2)

## 2024-03-30 MED ORDER — LORAZEPAM 2 MG/ML IJ SOLN
1.0000 mg | Freq: Once | INTRAMUSCULAR | Status: AC
  Administered 2024-03-30: 1 mg via INTRAVENOUS
  Filled 2024-03-30: qty 1

## 2024-03-30 NOTE — ED Notes (Signed)
 Patient is expressing SI in triage. While the RN and other staff members were obtaining VS, the patient took the pulse-ox cord and attempted to wrap it around his neck. After this incident, patient was dressed out into burgundy scrubs with the RN, Nathanael, RN, and Shanda PEAK with security at bedside for safety. Individual belongings listed under the belongings tab were placed in a white patient bag and moved to the quad designated belongings area since patient was moving to the quad after triage.

## 2024-03-30 NOTE — ED Provider Notes (Signed)
   [facility] Provider Note    Event Date/Time   First MD Initiated Contact with Patient 03/30/24 2215     (approximate)  History   Chief Complaint: Fall and Alcohol Intoxication  HPI  Randall Klutz. is a 63 y.o. male with a past medical history of alcohol abuse, hypertension, presents to the emergency department for alcohol intoxication and suicidal ideation.  Patient mitts to significant alcohol use today.  Patient states he is depressed that he wants to kill himself.  Patient was initially seen in one of the medical rooms but attempted to wrap a cord around his neck while the nurse was in the room so they moved him to a psychiatric bed.  Patient has no medical complaints today.  Patient continues to be agitated at times, we will dose 1 mg of Ativan  through the patient's IV.  Will check labs and have psychiatry and TTS evaluate.  I have placed the patient under an IVC.  Physical Exam   Triage Vital Signs: ED Triage Vitals  Encounter Vitals Group     BP 03/30/24 2154 104/74     Girls Systolic BP Percentile --      Girls Diastolic BP Percentile --      Boys Systolic BP Percentile --      Boys Diastolic BP Percentile --      Pulse Rate 03/30/24 2154 93     Resp 03/30/24 2154 18     Temp 03/30/24 2154 97.9 F (36.6 C)     Temp Source 03/30/24 2154 Oral     SpO2 03/30/24 2154 98 %     Weight 03/30/24 2221 160 lb 15 oz (73 kg)     Height 03/30/24 2221 5' 7 (1.702 m)     Head Circumference --      Peak Flow --      Pain Score 03/30/24 2155 10     Pain Loc --      Pain Education --      Exclude from Growth Chart --     Most recent vital signs: Vitals:   03/30/24 2154  BP: 104/74  Pulse: 93  Resp: 18  Temp: 97.9 F (36.6 C)  SpO2: 98%    General: Awake, no distress.  Appears intoxicated CV:  Good peripheral perfusion.  Regular rate and rhythm  Resp:  Normal effort.  Equal breath sounds bilaterally.  Abd:  No distention.  Soft, nontender.      ED Results / Procedures / Treatments   MEDICATIONS ORDERED IN ED: Medications  LORazepam  (ATIVAN ) injection 1 mg (1 mg Intravenous Given 03/30/24 2216)     IMPRESSION / MDM / ASSESSMENT AND PLAN / ED COURSE  I reviewed the triage vital signs and the nursing notes.  Patient's presentation is most consistent with acute presentation with potential threat to life or bodily function.  Patient presents to the emergency department for alcohol intoxication and states he wants to kill himself.  Will place patient under an IVC.  Patient becoming agitated at times we will dose 1 mg of Ativan  through the patient's IV.  Will check labs and have psychiatry TTS evaluate.  FINAL CLINICAL IMPRESSION(S) / ED DIAGNOSES   Alcohol intoxication Suicidal ideation   Note:  This document was prepared using Dragon voice recognition software and may include unintentional dictation errors.   Dorothyann Drivers, MD 04/02/24 2250

## 2024-03-30 NOTE — ED Notes (Signed)
 Pt given a sandwich tray and a cup of water at this time.

## 2024-03-30 NOTE — BH Assessment (Signed)
 Comprehensive Clinical Assessment (CCA) Screening, Triage and Referral Note  03/31/2024 Randall Harvey [id number] Recommendations for Services/Supports/Treatments: Psych consult/Disposition pending. Randall Harvey. is a 63 year old Caucasian, Not Hispanic or Latino ethnicity, ENGLISH speaking male. Pt presented to [facility] ED voluntarily. Per triage note: BIB EMS after he was walking in downtown [place] and fell on the sidewalk. ETOH on board. Patient is expressing SI.   On assessment, the patient was visibly depressed and tearful. Pt was expansive about his reasons for presenting to the hospital explaining that he has been stressed about his partner's drinking, his truck burning up, wanting to move; however, there are barriers, and his wife's mother just passed away. Pt admitted to being depressed. Pt explained that his mind tells him things the first thing in the morning. Pt admitted to alcohol use, reporting that his last use was earlier (03/30/24). Pt reported that he shakes when he doesn't drink. Pt repeatedly asked for more food throughout the assessment. Pt was in the contemplation stage of change, explaining that he may have a drinking problem. Pt is not connected to any services at this time and has no support system. Pt's speech was soft and slurred Pt presented with a depressed mood; affect was congruent. The patient denied current SI, HI, and V/H. Chief Complaint:  Chief Complaint  Patient presents with   Fall   Alcohol Intoxication   Visit Diagnosis: MDD (major depressive disorder)   Patient Reported Information How did you hear about us ? Family/Friend  What Is the Reason for Your Visit/Call Today? Patient to the ER intoxicated, voicing SI.  How Long Has This Been Causing You Problems? 1 wk - 1 month  What Do You Feel Would Help You the Most Today? Treatment for Depression or other mood problem; Alcohol or Drug Use Treatment   Have You Recently Had Any Thoughts About Hurting Yourself?  Yes  Are You Planning to Commit Suicide/Harm Yourself At This time? Yes   Have you Recently Had Thoughts About Hurting Someone Sherral? No  Are You Planning to Harm Someone at This Time? No  Explanation: No data recorded  Have You Used Any Alcohol or Drugs in the Past 24 Hours? Yes  How Long Ago Did You Use Drugs or Alcohol? Alcohol  What Did You Use and How Much? No data recorded  Do You Currently Have a Therapist/Psychiatrist? No  Name of Therapist/Psychiatrist: No data recorded  Have You Been Recently Discharged From Any Office Practice or Programs? No  Explanation of Discharge From Practice/Program: No data recorded   CCA Screening Triage Referral Assessment Type of Contact: Face-to-Face  Telemedicine Service Delivery:   Is this Initial or Reassessment?   Date Telepsych consult ordered in [facility]:    Time Telepsych consult ordered in [facility]:    Location of Assessment: [facility] ED  Provider Location: [facility] ED    Collateral Involvement: No data recorded  Does Patient Have a Court Appointed Legal Guardian? No data recorded Name and Contact of Legal Guardian: No data recorded If Minor and Not Living with Parent(s), Who has Custody? No data recorded Is CPS involved or ever been involved? Never  Is APS involved or ever been involved? Never   Patient Determined To Be At Risk for Harm To Self or Others Based on Review of Patient Reported Information or Presenting Complaint? Yes, for Self-Harm  Method: No data recorded Availability of Means: No data recorded Intent: No data recorded Notification Required: No data recorded Additional Information for Danger to  Others Potential: No data recorded Additional Comments for Danger to Others Potential: No data recorded Are There Guns or Other Weapons in Your Home? No data recorded Types of Guns/Weapons: No data recorded Are These Weapons Safely Secured?                            No data recorded Who Could Verify You Are Able To Have  These Secured: No data recorded Do You Have any Outstanding Charges, Pending Court Dates, Parole/Probation? No data recorded Contacted To Inform of Risk of Harm To Self or Others: No data recorded  Does Patient Present under Involuntary Commitment? Yes    [place] of Residence: Randall Harvey   Patient Currently Receiving the Following Services: Not Receiving Services   Determination of Need: Urgent (48 hours)   Options For Referral: ED Referral; Inpatient Hospitalization   Disposition Recommendation per psychiatric provider: Pending  Bentleigh Stankus R Margarita Croke, LCAS

## 2024-03-30 NOTE — ED Triage Notes (Signed)
 BIB EMS after he was walking in downtown [place] and fell on the sidewalk. ETOH on board. Patient is expressing SI.

## 2024-03-31 ENCOUNTER — Inpatient Hospital Stay
Admission: AD | Admit: 2024-03-31 | Discharge: 2024-04-04 | DRG: 885 | Disposition: A | Discharge status: Home / Self Care | Source: Intra-hospital | Attending: Psychiatry | Admitting: Psychiatry

## 2024-03-31 ENCOUNTER — Encounter: Payer: Self-pay | Admitting: Psychiatry

## 2024-03-31 DIAGNOSIS — K760 Fatty (change of) liver, not elsewhere classified: Secondary | ICD-10-CM | POA: Diagnosis present

## 2024-03-31 DIAGNOSIS — R45851 Suicidal ideations: Secondary | ICD-10-CM | POA: Diagnosis present

## 2024-03-31 DIAGNOSIS — F332 Major depressive disorder, recurrent severe without psychotic features: Secondary | ICD-10-CM

## 2024-03-31 DIAGNOSIS — Z86718 Personal history of other venous thrombosis and embolism: Secondary | ICD-10-CM

## 2024-03-31 DIAGNOSIS — R7303 Prediabetes: Secondary | ICD-10-CM | POA: Diagnosis present

## 2024-03-31 DIAGNOSIS — Z833 Family history of diabetes mellitus: Secondary | ICD-10-CM

## 2024-03-31 DIAGNOSIS — E669 Obesity, unspecified: Secondary | ICD-10-CM | POA: Diagnosis present

## 2024-03-31 DIAGNOSIS — Z823 Family history of stroke: Secondary | ICD-10-CM

## 2024-03-31 DIAGNOSIS — J449 Chronic obstructive pulmonary disease, unspecified: Secondary | ICD-10-CM | POA: Diagnosis present

## 2024-03-31 DIAGNOSIS — Z8249 Family history of ischemic heart disease and other diseases of the circulatory system: Secondary | ICD-10-CM

## 2024-03-31 DIAGNOSIS — I10 Essential (primary) hypertension: Secondary | ICD-10-CM | POA: Diagnosis present

## 2024-03-31 DIAGNOSIS — Z5982 Transportation insecurity: Secondary | ICD-10-CM

## 2024-03-31 DIAGNOSIS — Z7901 Long term (current) use of anticoagulants: Secondary | ICD-10-CM | POA: Diagnosis not present

## 2024-03-31 DIAGNOSIS — Z808 Family history of malignant neoplasm of other organs or systems: Secondary | ICD-10-CM | POA: Diagnosis not present

## 2024-03-31 DIAGNOSIS — Z85528 Personal history of other malignant neoplasm of kidney: Secondary | ICD-10-CM | POA: Diagnosis not present

## 2024-03-31 DIAGNOSIS — Z8673 Personal history of transient ischemic attack (TIA), and cerebral infarction without residual deficits: Secondary | ICD-10-CM | POA: Diagnosis not present

## 2024-03-31 DIAGNOSIS — Z86711 Personal history of pulmonary embolism: Secondary | ICD-10-CM

## 2024-03-31 DIAGNOSIS — Z825 Family history of asthma and other chronic lower respiratory diseases: Secondary | ICD-10-CM | POA: Diagnosis not present

## 2024-03-31 DIAGNOSIS — F10129 Alcohol abuse with intoxication, unspecified: Secondary | ICD-10-CM | POA: Diagnosis not present

## 2024-03-31 DIAGNOSIS — Z800 Family history of malignant neoplasm of digestive organs: Secondary | ICD-10-CM | POA: Diagnosis not present

## 2024-03-31 DIAGNOSIS — Z604 Social exclusion and rejection: Secondary | ICD-10-CM | POA: Diagnosis present

## 2024-03-31 DIAGNOSIS — F17210 Nicotine dependence, cigarettes, uncomplicated: Secondary | ICD-10-CM | POA: Diagnosis present

## 2024-03-31 DIAGNOSIS — F1010 Alcohol abuse, uncomplicated: Secondary | ICD-10-CM | POA: Diagnosis present

## 2024-03-31 DIAGNOSIS — Z79899 Other long term (current) drug therapy: Secondary | ICD-10-CM | POA: Diagnosis not present

## 2024-03-31 DIAGNOSIS — Z905 Acquired absence of kidney: Secondary | ICD-10-CM | POA: Diagnosis not present

## 2024-03-31 LAB — BASIC METABOLIC PANEL WITH GFR
Anion gap: 8 (ref 5–15)
BUN: 14 mg/dL (ref 8–23)
CO2: 25 mmol/L (ref 22–32)
Calcium: 8.4 mg/dL — ABNORMAL LOW (ref 8.9–10.3)
Chloride: 106 mmol/L (ref 98–111)
Creatinine, Ser: 0.75 mg/dL (ref 0.61–1.24)
GFR, Estimated: >60 mL/min
Glucose, Bld: 128 mg/dL — ABNORMAL HIGH (ref 70–99)
Potassium: 3.6 mmol/L (ref 3.5–5.1)
Sodium: 139 mmol/L (ref 135–145)

## 2024-03-31 LAB — URINALYSIS, ROUTINE W REFLEX MICROSCOPIC
Bilirubin Urine: NEGATIVE
Glucose, UA: 150 mg/dL — AB
Hgb urine dipstick: NEGATIVE
Ketones, ur: NEGATIVE mg/dL
Leukocytes,Ua: NEGATIVE
Nitrite: NEGATIVE
Protein, ur: 30 mg/dL — AB
Specific Gravity, Urine: 1.031 — ABNORMAL HIGH (ref 1.005–1.030)
Squamous Epithelial / HPF: 0 /HPF (ref 0–5)
pH: 5 (ref 5.0–8.0)

## 2024-03-31 LAB — SARS CORONAVIRUS 2 BY RT PCR: SARS Coronavirus 2 by RT PCR: NEGATIVE

## 2024-03-31 LAB — URINE DRUG SCREEN, QUALITATIVE (ARMC ONLY)
Amphetamines, Ur Screen: NOT DETECTED
Barbiturates, Ur Screen: NOT DETECTED
Benzodiazepine, Ur Scrn: POSITIVE — AB
Cannabinoid 50 Ng, Ur ~~LOC~~: NOT DETECTED
Cocaine Metabolite,Ur ~~LOC~~: NOT DETECTED
MDMA (Ecstasy)Ur Screen: NOT DETECTED
Methadone Scn, Ur: NOT DETECTED
Opiate, Ur Screen: NOT DETECTED
Phencyclidine (PCP) Ur S: NOT DETECTED
Tricyclic, Ur Screen: NOT DETECTED

## 2024-03-31 MED ORDER — TRAZODONE HCL 50 MG PO TABS
50.0000 mg | ORAL_TABLET | Freq: Every evening | ORAL | Status: DC | PRN
  Administered 2024-03-31 – 2024-04-03 (×4): 50 mg via ORAL
  Filled 2024-03-31 (×4): qty 1

## 2024-03-31 MED ORDER — LORAZEPAM 2 MG PO TABS: 0.0000 mg | ORAL_TABLET | Freq: Two times a day (BID) | ORAL | Status: DC

## 2024-03-31 MED ORDER — LORAZEPAM 1 MG PO TABS
0.0000 mg | ORAL_TABLET | Freq: Four times a day (QID) | ORAL | Status: AC
  Administered 2024-03-31 – 2024-04-01 (×3): 1 mg via ORAL
  Filled 2024-03-31 (×4): qty 1

## 2024-03-31 MED ORDER — MAGNESIUM HYDROXIDE 400 MG/5ML PO SUSP: 30.0000 mL | Freq: Every day | ORAL | Status: DC | PRN

## 2024-03-31 MED ORDER — THIAMINE MONONITRATE 100 MG PO TABS
100.0000 mg | ORAL_TABLET | Freq: Every day | ORAL | Status: DC
  Administered 2024-03-31: 100 mg via ORAL
  Filled 2024-03-31: qty 1

## 2024-03-31 MED ORDER — POTASSIUM CHLORIDE CRYS ER 20 MEQ PO TBCR
40.0000 meq | EXTENDED_RELEASE_TABLET | Freq: Once | ORAL | Status: AC
  Administered 2024-03-31: 40 meq via ORAL

## 2024-03-31 MED ORDER — THIAMINE MONONITRATE 100 MG PO TABS
100.0000 mg | ORAL_TABLET | Freq: Every day | ORAL | Status: DC
  Administered 2024-04-01 – 2024-04-04 (×4): 100 mg via ORAL
  Filled 2024-03-31 (×4): qty 1

## 2024-03-31 MED ORDER — THIAMINE HCL 100 MG/ML IJ SOLN: 100.0000 mg | Freq: Every day | INTRAMUSCULAR | Status: DC

## 2024-03-31 MED ORDER — ALUM & MAG HYDROXIDE-SIMETH 200-200-20 MG/5ML PO SUSP: 30.0000 mL | ORAL | Status: DC | PRN

## 2024-03-31 MED ORDER — NICOTINE 21 MG/24HR TD PT24
21.0000 mg | MEDICATED_PATCH | Freq: Once | TRANSDERMAL | Status: DC
  Administered 2024-03-31: 21 mg via TRANSDERMAL

## 2024-03-31 MED ORDER — LORAZEPAM 2 MG/ML IJ SOLN: 0.0000 mg | Freq: Four times a day (QID) | INTRAMUSCULAR | Status: DC

## 2024-03-31 MED ORDER — LORAZEPAM 2 MG/ML IJ SOLN: 0.0000 mg | Freq: Two times a day (BID) | INTRAMUSCULAR | Status: DC

## 2024-03-31 MED ORDER — OLANZAPINE 5 MG PO TBDP: 5.0000 mg | ORAL_TABLET | Freq: Three times a day (TID) | ORAL | Status: DC | PRN

## 2024-03-31 MED ORDER — OLANZAPINE 10 MG IM SOLR: 5.0000 mg | Freq: Three times a day (TID) | INTRAMUSCULAR | Status: DC | PRN

## 2024-03-31 MED ORDER — LORAZEPAM 2 MG PO TABS
0.0000 mg | ORAL_TABLET | Freq: Four times a day (QID) | ORAL | Status: DC
  Administered 2024-03-31: 2 mg via ORAL
  Filled 2024-03-31: qty 1

## 2024-03-31 MED ORDER — POTASSIUM CHLORIDE CRYS ER 20 MEQ PO TBCR
40.0000 meq | EXTENDED_RELEASE_TABLET | Freq: Once | ORAL | Status: AC
  Administered 2024-03-31: 40 meq via ORAL
  Filled 2024-03-31: qty 2

## 2024-03-31 MED ORDER — NICOTINE 14 MG/24HR TD PT24
14.0000 mg | MEDICATED_PATCH | Freq: Every day | TRANSDERMAL | Status: DC
  Administered 2024-04-01 – 2024-04-04 (×4): 14 mg via TRANSDERMAL
  Filled 2024-03-31 (×5): qty 1

## 2024-03-31 MED ORDER — POTASSIUM CHLORIDE CRYS ER 20 MEQ PO TBCR
40.0000 meq | EXTENDED_RELEASE_TABLET | Freq: Once | ORAL | Status: DC
  Filled 2024-03-31: qty 2

## 2024-03-31 MED ORDER — ACETAMINOPHEN 325 MG PO TABS
650.0000 mg | ORAL_TABLET | Freq: Four times a day (QID) | ORAL | Status: DC | PRN
  Administered 2024-04-02 – 2024-04-03 (×2): 650 mg via ORAL
  Filled 2024-03-31 (×2): qty 2

## 2024-03-31 MED ORDER — LORAZEPAM 2 MG/ML IJ SOLN
0.0000 mg | Freq: Four times a day (QID) | INTRAMUSCULAR | Status: AC
  Administered 2024-04-01: 1 mg via INTRAVENOUS

## 2024-03-31 NOTE — ED Notes (Signed)
 Pt breakfast provided at bedside

## 2024-03-31 NOTE — ED Notes (Signed)
 Psychiatrist & TTS at bedside.

## 2024-03-31 NOTE — ED Notes (Signed)
 Pt resting in bed with eyes closed. Pt is well appearing with RR even and unlabored.

## 2024-03-31 NOTE — Group Note (Signed)
 Date:  03/31/2024 Time:  9:16 PM  Group Topic/Focus:  Identifying Needs:   The focus of this group is to help patients identify their personal needs that have been historically problematic and identify healthy behaviors to address their needs.    Participation Level:  Active  Participation Quality:  Appropriate  Affect:  Appropriate  Cognitive:  Appropriate  Insight: Good  Engagement in Group:  Engaged  Modes of Intervention:  Discussion  Additional Comments:    [name] 03/31/2024, 9:16 PM

## 2024-03-31 NOTE — ED Notes (Signed)
Pt lunch provided.

## 2024-03-31 NOTE — Tx Team (Signed)
 Initial Treatment Plan 03/31/2024 5:38 PM Randall Harvey Randall Harvey. FMW:[id number]    PATIENT STRESSORS: Health problems   Substance abuse     PATIENT STRENGTHS: Communication skills    PATIENT IDENTIFIED PROBLEMS: Substance use                      DISCHARGE CRITERIA:  Adequate post-discharge living arrangements Improved stabilization in mood, thinking, and/or behavior  PRELIMINARY DISCHARGE PLAN: Return to previous living arrangement  PATIENT/FAMILY INVOLVEMENT: This treatment plan has been presented to and reviewed with the patient, Randall Harvey. The patient has been given the opportunity to ask questions and make suggestions.  Benton Harvey Gains, RN 03/31/2024, 5:38 PM

## 2024-03-31 NOTE — Group Note (Signed)
 Recreation Therapy Group Note   Group Topic:Emotion Expression  Group Date: 03/31/2024 Start Time: 1400 End Time: 1450 Facilitators: Celestia Jeoffrey BRAVO, LRT, CTRS Location: Dayroom  Group Description: Positive Affirmation Bingo. LRT and patients played multiple games of Bingo with music playing in the background. LRT and pts discussed what a positive affirmation is, the importance of speaking kindly to yourself, and the use of this as a coping skill.   Goal Area(s) Addressed: Patient will learn positive affirmations.  Patient will engage in recreation activity.  Patient will increase communication.    Affect/Mood: N/A   Participation Level: Did not attend    Clinical Observations/Individualized Feedback: Patient did not attend group.   Plan: Continue to engage patient in RT group sessions 2-3x/week.   Jeoffrey BRAVO Celestia, LRT, CTRS 03/31/2024 4:51 PM

## 2024-03-31 NOTE — ED Notes (Signed)
Snack has been provided.

## 2024-03-31 NOTE — Consult Note (Signed)
 [facility] Psychiatric Consult Initial  Patient Name: .Randall Harvey.  MRN: [id number]  DOB: 02-15-1961  Consult Order details:  Orders (From admission, onward)     Start     Ordered   03/30/24 2228  CONSULT TO CALL ACT TEAM       Ordering Provider: Dorothyann Drivers, MD  Provider:  (Not yet assigned)  Question:  Reason for Consult?  Answer:  Psych consult   03/30/24 2228   03/30/24 2228  IP CONSULT TO PSYCHIATRY       Ordering Provider: Dorothyann Drivers, MD  Provider:  (Not yet assigned)  Question Answer Comment  Consult Timeframe ROUTINE - requires response within 24 hours   Reason for Consult? Consult for medication management   Contact phone number where the requesting provider can be reached [phone]      03/30/24 2228             Mode of Visit: In person    Psychiatry Consult Evaluation  Service Date: March 31, 2024 LOS:  LOS: 0 days  Chief Complaint I don't know honey  Primary Psychiatric Diagnoses  MDD recurrent severe 2.  Alcohol use disorder severe 3.    Assessment  Randall Harvey. is a 63 y.o. male admitted: Medicallyfor 03/30/2024  9:50 PM with history of depression and chronic alcoholism with multiple ED visits in the month of June for alcohol intoxication, falls brought in by the cops after he was noted to be falling on the curb side on the road and made suicidal statements.  Psychiatry is consulted for safety evaluation.  On assessment with the LCSW last night patient was tearful endorsing depression, multiple psychosocial stressors, suicidal thoughts and even made gestures by putting a cord around his neck.  On assessment today with this provider patient is more sober, minimizes his mental health problems and substance use problems but is noted to be tearful and emotionally labile talking about not able to find his girlfriend.  He talks about his health issues, not having a car or a phone to communicate or go for his appointments.  Considering the context of  high risk impulsive behaviors including substance use, suicidal gesture by putting cord around her's neck, mood lability and tearfulness, inability to make it to healthcare appointments due to social factors patient remains high risk for suicide.  Will maintain IVC and patient needs inpatient psychiatric admission at this time.    Diagnoses:  Active Hospital problems: Active Problems:   * No active hospital problems. *    Plan   ## Psychiatric Medication Recommendations:  Will restart his home medications when patient is admitted to inpatient  ## Medical Decision Making Capacity: Not specifically addressed in this encounter  ## Further Work-up:  -- Replenish potassium TSH, B12, folate -- most recent EKG on 03/29/2024 had QtC of 485    ## Disposition:-- We recommend inpatient psychiatric hospitalization after medical hospitalization. Patient has been involuntarily committed on 03/30/2024.   ## Behavioral / Environmental: -Difficult Patient (SELECT OPTIONS FROM BELOW)    ## Safety and Observation Level:  - Based on my clinical evaluation, I estimate the patient to be at chronic moderate risk of self harm in the current setting. - At this time, we recommend  1:1 Observation. This decision is based on my review of the chart including patient's history and current presentation, interview of the patient, mental status examination, and consideration of suicide risk including evaluating suicidal ideation, plan, intent, suicidal or self-harm behaviors,  risk factors, and protective factors. This judgment is based on our ability to directly address suicide risk, implement suicide prevention strategies, and develop a safety plan while the patient is in the clinical setting. Please contact our team if there is a concern that risk level has changed.  CSSR Risk Category:C-SSRS RISK CATEGORY: High Risk  Suicide Risk Assessment: Patient has following modifiable risk factors for suicide: untreated  depression, medication noncompliance, lack of access to outpatient mental health resources, and current symptoms: anxiety/panic, insomnia, impulsivity, anhedonia, hopelessness, which we are addressing by inpatient psychiatric admission for stabilization. Patient has following non-modifiable or demographic risk factors for suicide: male gender and psychiatric hospitalization Patient has the following protective factors against suicide: no history of suicide attempts  Thank you for this consult request. Recommendations have been communicated to the primary team.  We will follow-up as needed basis at this time.   Tayvin Preslar, MD       History of Present Illness  Ed Triage notes: BIB EMS after he was walking in downtown [place] and fell on the sidewalk. ETOH on board. Patient is expressing SI.  Patient is expressing SI in triage. While the RN and other staff members were obtaining VS, the patient took the pulse-ox cord and attempted to wrap it around his neck. After this incident, patient was dressed out into burgundy scrubs with the RN, Nathanael, RN, and Shanda PEAK with security at bedside for safety. Individual belongings listed under the belongings tab were placed in a white patient bag and moved to the quad designated belongings area since patient was moving to the quad after triage.   Patient Report:  On interview patient reports that he has no memory of last night events and does acknowledge that he was drunk.  He was not even aware of the events that happened at that led up to the cops bringing him to the emergency room, him making suicidal statements and gestures by putting the cord around his neck.  He is noticeably getting frustrated when asked for details about last night.  He remains focused on not able to find his girlfriend whom he reports that was in jail and got out on Saturday.  He reports that he went to check on her yesterday and she was not in the jail which flipped him.  He reports  that he is worried about her.  He reports his mood as confused.  He reports drinking alcohol on daily basis but is unable to quantify the amount of alcohol he drinks.  He does give history of withdrawal from alcohol but denies having any seizures or DTs from alcohol.  He reports feeling depression, feeling hopeless and worthless, denies SI/HI/plan and denies auditory/visual hallucinations.  He reports fair appetite and sleep.  He reports worsening anxiety and intermittent panic attacks.  He does report ongoing shakes and withdrawal from alcohol.  He denies auditory/visual hallucinations.  He is denying recent or previous episodes of mania/hypomania.  He denies nightmares or flashbacks.   Collateral information:  Per chart sister Donna's daughter [phone]    Psychiatric and Social History  Psychiatric History:  Information collected from patient  Prev Dx/Sx: anxiety, alcohol use Current Psych Provider: none Home Meds (current): none reported Previous Med Trials: unknown Therapy: denies   Prior Psych Hospitalization: [facility] 2024  Prior Self Harm: denies Prior Violence: denies   Family Psych History: denies Family Hx suicide: denies   Social History:  Developmental Hx: normal Educational Hx: 10th grade Occupational Hx: on  disability Legal Hx: denies Living Situation: rooming Spiritual Hx: denies Access to weapons/lethal means: denies    Substance History Alcohol: pt minimizes  Type of alcohol beer Last Drink per pt 03/30/24 Number of drinks per day per pt 4 History of alcohol withdrawal seizures denies History of DT's denies Tobacco: 1PP/3days Illicit drugs: denies Prescription drug abuse: denies Rehab hx: none reported  Exam Findings  Physical Exam: Reviewed and agree with the physical exam findings conducted by the medical provider Vital Signs:  Temp:  [97.5 F (36.4 C)-98 F (36.7 C)] 98 F (36.7 C) (07/03 1538) Pulse Rate:  [84-93] 84 (07/03 1538) Resp:  [18-20]  18 (07/03 1538) BP: (104-152)/(74-89) 152/89 (07/03 1538) SpO2:  [97 %-100 %] 100 % (07/03 1538) Weight:  [73 kg] 73 kg (07/02 2221) Blood pressure (!) 152/89, pulse 84, temperature 98 F (36.7 C), temperature source Oral, resp. rate 18, height 5' 7 (1.702 m), weight 73 kg, SpO2 100%. Body mass index is 25.21 kg/m.    Mental Status Exam: General Appearance: Disheveled  Orientation:  Negative  Memory:  Immediate;   Poor Recent;   Poor Remote;   Poor  Concentration:  Concentration: Poor and Attention Span: Poor  Recall:  Poor  Attention  Poor  Eye Contact:  Fair  Speech:  Clear and Coherent  Language:  Fair  Volume:  Normal  Mood: depressed  Affect:  Tearful  Thought Process:  Coherent  Thought Content:  Illogical  Suicidal Thoughts:  yes and gesture by putting cord around his neck  Homicidal Thoughts:  No  Judgement:  Impaired  Insight:  Shallow  Psychomotor Activity:  Increased  Akathisia:  No  Fund of Knowledge:  Fair      Assets:  Communication Skills  Cognition:  WNL  ADL's:  Intact  AIMS (if indicated):        Other History   These have been pulled in through the EMR, reviewed, and updated if appropriate.  Family History:  The patient's family history includes COPD in his sister; Cancer in his paternal grandmother; Colon cancer in his father and sister; Diabetes in his father; Heart disease in his paternal grandfather; Ovarian cancer in his sister; Pancreatic cancer in his mother; Seizures in his sister; Stroke in his paternal grandfather.  Medical History: Past Medical History:  Diagnosis Date   Alcohol intoxication (HCC)    Anticoagulant long-term use    lifetime use, coumadin therapy, then Xarelto    Anxiety    Arthritis    Cancer (HCC)    renal   Carotid atherosclerosis    <50% left and right   Clotting disorder (HCC)    COPD (chronic obstructive pulmonary disease) (HCC)    Depression    Elevated liver enzymes 11/2013   Family history of cancer     Fatty liver    H/O drug abuse (HCC)    H/O ETOH abuse    Hepatitis    History of traumatic brain injury    Hypertension    Lymphadenopathy    Obesity    Personal history of DVT (deep vein thrombosis) 06/2013   Pre-diabetes    Primary osteoarthritis of left knee    Pulmonary embolism and infarction (HCC) 06/2013   Pulmonary embolism and infarction (HCC) 11/2013   Pulmonary embolus with infarction [facility])    Renal cell carcinoma (HCC)    Renal mass, left 06/2013   with ureteral obstruction   Stroke (HCC)    Subarachnoid hemorrhage following injury (HCC)  Thrombocythemia    Thrombocytopenia (HCC)    Tobacco abuse     Surgical History: Past Surgical History:  Procedure Laterality Date   ANKLE FRACTURE SURGERY Right 12/2012   ANKLE SURGERY     EXCISION METACARPAL MASS Right 09/18/2016   Procedure: EXCISION METACARPAL MASS;  Surgeon: Ozell Flake, MD;  Location: [facility] ORS;  Service: Orthopedics;  Laterality: Right;  5th digit    HERNIA REPAIR     Umbilical Hernia   ORBITAL FRACTURE SURGERY Left    ROBOTIC ASSITED PARTIAL NEPHRECTOMY Left Oct 2014     Medications:   Current Facility-Administered Medications:    LORazepam  (ATIVAN ) injection 0-4 mg, 0-4 mg, Intravenous, Q6H **OR** LORazepam  (ATIVAN ) tablet 0-4 mg, 0-4 mg, Oral, Q6H, Ray, Neha, MD, 2 mg at 03/31/24 1217   [START ON 04/02/2024] LORazepam  (ATIVAN ) injection 0-4 mg, 0-4 mg, Intravenous, Q12H **OR** [START ON 04/02/2024] LORazepam  (ATIVAN ) tablet 0-4 mg, 0-4 mg, Oral, Q12H, Ray, Neha, MD   nicotine  (NICODERM CQ  - dosed in mg/24 hours) patch 21 mg, 21 mg, Transdermal, Once, Ray, Neha, MD, 21 mg at 03/31/24 1217   thiamine  (VITAMIN B1) tablet 100 mg, 100 mg, Oral, Daily, 100 mg at 03/31/24 1217 **OR** thiamine  (VITAMIN B1) injection 100 mg, 100 mg, Intravenous, Daily, Ray, Neha, MD  Current Outpatient Medications:    DULoxetine  (CYMBALTA ) 30 MG capsule, Take 1 capsule (30 mg total) by mouth daily., Disp: 30 capsule, Rfl:  0   mirtazapine  (REMERON ) 7.5 MG tablet, Take 1 tablet (7.5 mg total) by mouth at bedtime., Disp: 30 tablet, Rfl: 0   traZODone  (DESYREL ) 50 MG tablet, Take 1 tablet (50 mg total) by mouth at bedtime as needed for sleep., Disp: 30 tablet, Rfl: 0  Allergies: No Known Allergies  Zackari Ruane, MD

## 2024-03-31 NOTE — ED Provider Notes (Signed)
 Emergency Medicine Observation Re-evaluation Note  Randall Harvey. is a 63 y.o. male, seen on rounds today.  Pt initially presented to the ED for complaints of Fall and Alcohol Intoxication Currently, the patient is resting.  Physical Exam  BP 104/74 (BP Location: Right Arm)   Pulse 93   Temp 97.9 F (36.6 C) (Oral)   Resp 18   Ht 5' 7 (1.702 m)   Wt 73 kg   SpO2 98%   BMI 25.21 kg/m  Physical Exam .Gen:  No acute distress Resp:  Breathing easily and comfortably, no accessory muscle usage Neuro:  Moving all four extremities, no gross focal neuro deficits Psych:  Resting currently, calm when awake   ED Course / MDM  EKG:   I have reviewed the labs performed to date as well as medications administered while in observation.  Recent changes in the last 24 hours include no acute events.  Plan  Current plan is for pending psych disposition.    Waymond Lorelle Cummins, MD 03/31/24 [phone]

## 2024-03-31 NOTE — Progress Notes (Signed)
   03/31/24 1633  Psych Admission Type (Psych Patients Only)  Admission Status Involuntary  Psychosocial Assessment  Patient Complaints Anxiety  Eye Contact Fair  Facial Expression Anxious  Affect Anxious  Speech Logical/coherent  Interaction Minimal  Motor Activity Slow  Appearance/Hygiene In scrubs  Behavior Characteristics Appropriate to situation  Mood Anxious  Aggressive Behavior  Effect No apparent injury  Thought Process  Coherency WDL  Content Blaming others  Delusions None reported or observed  Perception WDL  Hallucination None reported or observed  Judgment Limited  Confusion WDL  Danger to Self  Current suicidal ideation? Denies  Danger to Others  Danger to Others None reported or observed

## 2024-03-31 NOTE — Plan of Care (Signed)
  Problem: Education: Goal: Ability to make informed decisions regarding treatment will improve Outcome: Not Progressing   Problem: Coping: Goal: Coping ability will improve Outcome: Not Progressing   Problem: Health Behavior/Discharge Planning: Goal: Identification of resources available to assist in meeting health care needs will improve Outcome: Not Progressing   Problem: Medication: Goal: Compliance with prescribed medication regimen will improve Outcome: Not Progressing   Problem: Self-Concept: Goal: Ability to disclose and discuss suicidal ideas will improve Outcome: Not Progressing Goal: Will verbalize positive feelings about self Outcome: Not Progressing Note: Patient is on track. Patient will maintain adherence

## 2024-03-31 NOTE — ED Notes (Addendum)
 Patient has been accepted to [facility] 03/31/24 pending negative covid. Patient not assigned to a room. Accepting physician is Dr. Jadapalle. Call report to [phone] Representative was [facility] Linsey.   ER Staff is aware of it: Luann ER Secretary Dr. Levander, ER MD [facility] Patient's Nurse   Michial Skeen, [facility] [phone]

## 2024-03-31 NOTE — Group Note (Signed)
 [facility] LCSW Group Therapy Note    Group Date: 03/31/2024 Start Time: 1300 End Time: 1330  Type of Therapy and Topic:  Group Therapy:  Overcoming Obstacles  Participation Level:  BHH PARTICIPATION LEVEL: Did Not Attend  Mood:  Description of Group:   In this group patients will be encouraged to explore what they see as obstacles to their own wellness and recovery. They will be guided to discuss their thoughts, feelings, and behaviors related to these obstacles. The group will process together ways to cope with barriers, with attention given to specific choices patients can make. Each patient will be challenged to identify changes they are motivated to make in order to overcome their obstacles. This group will be process-oriented, with patients participating in exploration of their own experiences as well as giving and receiving support and challenge from other group members.  Therapeutic Goals: 1. Patient will identify personal and current obstacles as they relate to admission. 2. Patient will identify barriers that currently interfere with their wellness or overcoming obstacles.  3. Patient will identify feelings, thought process and behaviors related to these barriers. 4. Patient will identify two changes they are willing to make to overcome these obstacles:    Summary of Patient Progress   X   Therapeutic Modalities:   Cognitive Behavioral Therapy Solution Focused Therapy Motivational Interviewing Relapse Prevention Therapy   Lum JONETTA Croft, LCSWA

## 2024-03-31 NOTE — ED Notes (Signed)
 Pt requested sandwich tray and ginger ale. This tech explained to pt that he already had one sand which tray and policy says we do not give seconds aswell as we do not give sodas after dinner. Pt picked  up his water cup and throw it at this tech and officer.

## 2024-04-01 DIAGNOSIS — F332 Major depressive disorder, recurrent severe without psychotic features: Secondary | ICD-10-CM | POA: Diagnosis not present

## 2024-04-01 MED ORDER — MIRTAZAPINE 15 MG PO TABS
7.5000 mg | ORAL_TABLET | Freq: Every day | ORAL | Status: DC
  Administered 2024-04-01 – 2024-04-03 (×3): 7.5 mg via ORAL
  Filled 2024-04-01 (×3): qty 1

## 2024-04-01 MED ORDER — DULOXETINE HCL 30 MG PO CPEP
30.0000 mg | ORAL_CAPSULE | Freq: Every day | ORAL | Status: DC
  Administered 2024-04-01 – 2024-04-02 (×2): 30 mg via ORAL
  Filled 2024-04-01 (×2): qty 1

## 2024-04-01 NOTE — Plan of Care (Signed)
   Problem: Coping: Goal: Coping ability will improve Outcome: Progressing   Problem: Medication: Goal: Compliance with prescribed medication regimen will improve Outcome: Progressing

## 2024-04-01 NOTE — Progress Notes (Signed)
   04/01/24 1733  Psych Admission Type (Psych Patients Only)  Admission Status Involuntary  Psychosocial Assessment  Patient Complaints Anxiety;Depression  Eye Contact Fair  Facial Expression Anxious  Affect Anxious  Speech Logical/coherent  Interaction Minimal  Motor Activity Slow  Appearance/Hygiene In scrubs  Behavior Characteristics Appropriate to situation  Mood Sad  Thought Process  Coherency WDL  Content Preoccupation  Delusions None reported or observed  Perception WDL  Hallucination None reported or observed  Judgment Limited  Confusion WDL  Danger to Self  Current suicidal ideation? Denies  Danger to Others  Danger to Others None reported or observed

## 2024-04-01 NOTE — Group Note (Unsigned)
 Date:  04/01/2024 Time:  11:13 AM  Group Topic/Focus:  Fresh air therapyoutdoors with music and conversation.     Participation Level:  {BHH PARTICIPATION OZCZO:77735}  Participation Quality:  {BHH PARTICIPATION QUALITY:22265}  Affect:  {BHH AFFECT:22266}  Cognitive:  {BHH COGNITIVE:22267}  Insight: {BHH Insight2:20797}  Engagement in Group:  {BHH ENGAGEMENT IN HMNLE:77731}  Modes of Intervention:  {BHH MODES OF INTERVENTION:22269}  Additional Comments:  ***  Norleen SHAUNNA Bias 04/01/2024, 11:13 AM

## 2024-04-01 NOTE — Progress Notes (Signed)
   04/01/24 0000  Psych Admission Type (Psych Patients Only)  Admission Status Involuntary  Psychosocial Assessment  Patient Complaints Depression;Crying spells;Sadness  Eye Contact Fair  Facial Expression Sad  Affect Sad  Speech Logical/coherent  Interaction Minimal  Motor Activity Slow  Appearance/Hygiene In scrubs  Behavior Characteristics Appropriate to situation;Cooperative  Mood Sad  Aggressive Behavior  Effect No apparent injury  Thought Process  Coherency WDL  Content Blaming others  Delusions None reported or observed  Perception WDL  Hallucination None reported or observed  Judgment Limited  Confusion WDL  Danger to Self  Current suicidal ideation? Denies (denies)  Danger to Others  Danger to Others None reported or observed   Patient was up and visible at the start of the shift. Calm and cooperative with all aspects of care this evening/ night. Appropriately interactive with both staff and peers. Offered PRN to assist with sleep, per request, which was noted to be effective (see EMAR). Remains on CIWA assessment, last CIWA = 5, received ativan  1mg  P.O. at 2400 and at 0600 for s/s withdrawals. Safe behaviors displayed thus far. No complaints voiced. No distress noted/ reported. Patient remains in bed resting at this time, rise/ fall of chest noted. All orders maintained as written.

## 2024-04-01 NOTE — Group Note (Unsigned)
 Date:  04/01/2024 Time:  11:00 AM  Group Topic/Focus:  Fresh air Therapy Outdoors with Music and Conversation.     Participation Level:  {BHH PARTICIPATION OZCZO:77735}  Participation Quality:  {BHH PARTICIPATION QUALITY:22265}  Affect:  {BHH AFFECT:22266}  Cognitive:  {BHH COGNITIVE:22267}  Insight: {BHH Insight2:20797}  Engagement in Group:  {BHH ENGAGEMENT IN HMNLE:77731}  Modes of Intervention:  {BHH MODES OF INTERVENTION:22269}  Additional Comments:  ***  Norleen SHAUNNA Bias 04/01/2024, 11:00 AM

## 2024-04-01 NOTE — Group Note (Signed)
 Recreation Therapy Group Note   Group Topic:Stress Management  Group Date: 04/01/2024 Start Time: 1400 End Time: 1445 Facilitators: Celestia Jeoffrey BRAVO, LRT, CTRS Location: Dayroom  Group Description: PMR (Progressive Muscle Relaxation). LRT educates patients on what PMR is and the benefits that come from it. Patients are asked to sit with their feet flat on the floor while sitting up and all the way back in their chair, if possible. LRT and pts follow a prompt through a speaker that requires you to tense and release different muscles in their body and focus on their breathing. During session, lights are off and soft music is being played. Pts are given a stress ball to use if needed.  Goal Area(s) Addressed:  Patients will be able to describe progressive muscle relaxation.  Patient will practice using relaxation technique. Patient will identify a new coping skill.  Patient will follow multistep directions to reduce anxiety and stress.   Affect/Mood: N/A   Participation Level: Did not attend    Clinical Observations/Individualized Feedback: Patient did not attend group.   Plan: Continue to engage patient in RT group sessions 2-3x/week.   Jeoffrey BRAVO Celestia, LRT, CTRS 04/01/2024 3:05 PM

## 2024-04-01 NOTE — [facility] Suicide Risk Assessment (Signed)
 [facility] Admission Suicide Risk Assessment   Nursing information obtained from:  Patient Demographic factors:  Male, Age 65 or older, Divorced or widowed, Caucasian Current Mental Status:  NA Loss Factors:  NA Historical Factors:  NA Risk Reduction Factors:  NA  Total Time spent with patient: 30 minutes Principal Problem: MDD (major depressive disorder), recurrent severe, without psychosis (HCC) Diagnosis:  Principal Problem:   MDD (major depressive disorder), recurrent severe, without psychosis (HCC)  Subjective Data: Randall Bos. is a 63 y.o. male admitted: Medicallyfor 03/30/2024  9:50 PM with history of depression and chronic alcoholism with multiple ED visits in the month of June for alcohol intoxication, falls brought in by the cops after he was noted to be falling on the curb side on the road and made suicidal statements.  Psychiatry is consulted for safety evaluation.   Continued Clinical Symptoms:  Alcohol Use Disorder Identification Test Final Score (AUDIT): 21 The Alcohol Use Disorders Identification Test, Guidelines for Use in Primary Care, Second Edition.  World Science writer [facility]). Score between 0-7:  no or low risk or alcohol related problems. Score between 8-15:  moderate risk of alcohol related problems. Score between 16-19:  high risk of alcohol related problems. Score 20 or above:  warrants further diagnostic evaluation for alcohol dependence and treatment.   CLINICAL FACTORS:   Depression:   Comorbid alcohol abuse/dependence   Musculoskeletal: Strength & Muscle Tone: decreased Gait & Station: unsteady Patient leans: N/A  Psychiatric Specialty Exam:  Presentation  General Appearance:  Appropriate for Environment; Casual  Eye Contact: Fair  Speech: Normal Rate  Speech Volume: Normal  Handedness: Right   Mood and Affect  Mood: Depressed; Anxious  Affect: Constricted   Thought Process  Thought Processes: Coherent  Descriptions of  Associations:Intact  Orientation:Full (Time, Place and Person)  Thought Content:Illogical  History of Schizophrenia/Schizoaffective disorder:No  Duration of Psychotic Symptoms:N/A  Hallucinations:Hallucinations: None  Ideas of Reference:None  Suicidal Thoughts:Suicidal Thoughts: No  Homicidal Thoughts:Homicidal Thoughts: No   Sensorium  Memory: Immediate Fair; Recent Fair; Remote Poor  Judgment: Impaired  Insight: Shallow   Executive Functions  Concentration: Fair  Attention Span: Poor  Recall: Poor  Fund of Knowledge: Fair  Language: Fair   Psychomotor Activity  Psychomotor Activity: Psychomotor Activity: Normal   Assets  Assets: Communication Skills; Desire for Improvement; Social Support; Resilience   Sleep  Sleep: Sleep: Fair    Physical Exam: Physical Exam ROS Blood pressure (!) 147/82, pulse 78, temperature 97.7 F (36.5 C), temperature source Oral, resp. rate 20, height 5' 2 (1.575 m), weight 73.9 kg, SpO2 99%. Body mass index is 29.81 kg/m.   COGNITIVE FEATURES THAT CONTRIBUTE TO RISK:  None    SUICIDE RISK:   Minimal: No identifiable suicidal ideation.  Patients presenting with no risk factors but with morbid ruminations; may be classified as minimal risk based on the severity of the depressive symptoms  PLAN OF CARE: Patient is admitted to [facility] psych unit with Q15 min safety monitoring. Multidisciplinary team approach is offered. Medication management; group/milieu therapy is offered.   I certify that inpatient services furnished can reasonably be expected to improve the patient's condition.   Allyn Foil, MD 04/01/2024, 6:32 PM

## 2024-04-01 NOTE — Group Note (Unsigned)
 Date:  04/01/2024 Time:  11:18 AM  Group Topic/Focus:  Fresh airTherapy with music.     Participation Level:  {B[facility] PARTICIPATION OZCZO:77735}  Participation Quality:  {[facility] PARTICIPATION QUALITY:22265}  Affect:  {B[facility] AFFECT:22266}  Cognitive:  {[facility] COGNITIVE:22267}  Insight: {BHH Insight2:20797}  Engagement in Group:  {BHH ENGAGEMENT IN HMNLE:77731}  Modes of Intervention:  {B[facility] MODES OF INTERVENTION:22269}  Additional Comments:  ***  Randall Harvey 04/01/2024, 11:18 AM

## 2024-04-01 NOTE — BH IP Treatment Plan (Signed)
 Interdisciplinary Treatment and Diagnostic Plan Update  04/01/2024 Time of Session: 3:10 PM  Randall Harvey. MRN: [id number]  Principal Diagnosis: MDD (major depressive disorder), recurrent severe, without psychosis (HCC)  Secondary Diagnoses: Principal Problem:   MDD (major depressive disorder), recurrent severe, without psychosis (HCC)   Current Medications:  Current Facility-Administered Medications  Medication Dose Route Frequency Provider Last Rate Last Admin   acetaminophen  (TYLENOL ) tablet 650 mg  650 mg Oral Q6H PRN Jadapalle, Sree, MD       alum & mag hydroxide-simeth (MAALOX/MYLANTA) 200-200-20 MG/5ML suspension 30 mL  30 mL Oral Q4H PRN Jadapalle, Sree, MD       DULoxetine  (CYMBALTA ) DR capsule 30 mg  30 mg Oral Daily Jadapalle, Sree, MD   30 mg at 04/01/24 1218   LORazepam  (ATIVAN ) injection 0-4 mg  0-4 mg Intravenous Q6H Jadapalle, Sree, MD       Or   LORazepam  (ATIVAN ) tablet 0-4 mg  0-4 mg Oral Q6H Jadapalle, Sree, MD   1 mg at 04/01/24 9377   magnesium  hydroxide (MILK OF MAGNESIA) suspension 30 mL  30 mL Oral Daily PRN Donnelly Mellow, MD       mirtazapine  (REMERON ) tablet 7.5 mg  7.5 mg Oral QHS Jadapalle, Sree, MD       nicotine  (NICODERM CQ  - dosed in mg/24 hours) patch 14 mg  14 mg Transdermal Daily Jadapalle, Sree, MD   14 mg at 04/01/24 9081   OLANZapine  (ZYPREXA ) injection 5 mg  5 mg Intramuscular TID PRN Jadapalle, Sree, MD       OLANZapine  zydis (ZYPREXA ) disintegrating tablet 5 mg  5 mg Oral TID PRN Jadapalle, Sree, MD       thiamine  (VITAMIN B1) tablet 100 mg  100 mg Oral Daily Jadapalle, Sree, MD   100 mg at 04/01/24 9081   Or   thiamine  (VITAMIN B1) injection 100 mg  100 mg Intravenous Daily Jadapalle, Sree, MD       traZODone  (DESYREL ) tablet 50 mg  50 mg Oral QHS PRN Jadapalle, Sree, MD   50 mg at 03/31/24 2042   PTA Medications: Medications Prior to Admission  Medication Sig Dispense Refill Last Dose/Taking   DULoxetine  (CYMBALTA ) 30 MG capsule Take 1  capsule (30 mg total) by mouth daily. 30 capsule 0    mirtazapine  (REMERON ) 7.5 MG tablet Take 1 tablet (7.5 mg total) by mouth at bedtime. 30 tablet 0    traZODone  (DESYREL ) 50 MG tablet Take 1 tablet (50 mg total) by mouth at bedtime as needed for sleep. 30 tablet 0     Patient Stressors: Health problems   Substance abuse    Patient Strengths: Communication skills   Treatment Modalities: Medication Management, Group therapy, Case management,  1 to 1 session with clinician, Psychoeducation, Recreational therapy.   Physician Treatment Plan for Primary Diagnosis: MDD (major depressive disorder), recurrent severe, without psychosis (HCC) Long Term Goal(s):     Short Term Goals:    Medication Management: Evaluate patient's response, side effects, and tolerance of medication regimen.  Therapeutic Interventions: 1 to 1 sessions, Unit Group sessions and Medication administration.  Evaluation of Outcomes: Not Progressing  Physician Treatment Plan for Secondary Diagnosis: Principal Problem:   MDD (major depressive disorder), recurrent severe, without psychosis (HCC)  Long Term Goal(s):     Short Term Goals:       Medication Management: Evaluate patient's response, side effects, and tolerance of medication regimen.  Therapeutic Interventions: 1 to 1 sessions, Unit Group sessions and  Medication administration.  Evaluation of Outcomes: Not Progressing   RN Treatment Plan for Primary Diagnosis: MDD (major depressive disorder), recurrent severe, without psychosis (HCC) Long Term Goal(s): Knowledge of disease and therapeutic regimen to maintain health will improve  Short Term Goals: Ability to remain free from injury will improve, Ability to verbalize frustration and anger appropriately will improve, Ability to demonstrate self-control, Ability to participate in decision making will improve, Ability to verbalize feelings will improve, Ability to disclose and discuss suicidal ideas,  Ability to identify and develop effective coping behaviors will improve, and Compliance with prescribed medications will improve  Medication Management: RN will administer medications as ordered by provider, will assess and evaluate patient's response and provide education to patient for prescribed medication. RN will report any adverse and/or side effects to prescribing provider.  Therapeutic Interventions: 1 on 1 counseling sessions, Psychoeducation, Medication administration, Evaluate responses to treatment, Monitor vital signs and CBGs as ordered, Perform/monitor CIWA, COWS, AIMS and Fall Risk screenings as ordered, Perform wound care treatments as ordered.  Evaluation of Outcomes: Not Progressing   LCSW Treatment Plan for Primary Diagnosis: MDD (major depressive disorder), recurrent severe, without psychosis (HCC) Long Term Goal(s): Safe transition to appropriate next level of care at discharge, Engage patient in therapeutic group addressing interpersonal concerns.  Short Term Goals: Engage patient in aftercare planning with referrals and resources, Increase social support, Increase ability to appropriately verbalize feelings, Increase emotional regulation, Facilitate acceptance of mental health diagnosis and concerns, Facilitate patient progression through stages of change regarding substance use diagnoses and concerns, Identify triggers associated with mental health/substance abuse issues, and Increase skills for wellness and recovery  Therapeutic Interventions: Assess for all discharge needs, 1 to 1 time with Social worker, Explore available resources and support systems, Assess for adequacy in community support network, Educate family and significant other(s) on suicide prevention, Complete Psychosocial Assessment, Interpersonal group therapy.  Evaluation of Outcomes: Not Progressing   Progress in Treatment: Attending groups: Yes. and No. Participating in groups: Yes. and No. Taking  medication as prescribed: Yes. Toleration medication: Yes. Family/Significant other contact made: No, will contact:  CSW will contact if given permission  Patient understands diagnosis: Yes. Discussing patient identified problems/goals with staff: Yes. Medical problems stabilized or resolved: Yes. Denies suicidal/homicidal ideation: Yes. Issues/concerns per patient self-inventory: No. Other: None   New problem(s) identified: No, Describe:  None identified   New Short Term/Long Term Goal(s): elimination of symptoms of psychosis, medication management for mood stabilization; elimination of SI thoughts; development of comprehensive mental wellness/sobriety plan.   Patient Goals:  to get better and do better  Discharge Plan or Barriers: CSW will assist with appropriate discharge planning   Reason for Continuation of Hospitalization: Depression Medication stabilization  Estimated Length of Stay: 1 to 7 days   Last 3 [place] Suicide Severity Risk Score: Flowsheet Row Admission (Current) from 03/31/2024 in [facility] ED from 03/30/2024 in [facility] Emergency Department at [facility] ED from 03/28/2024 in [facility] Emergency Department at [facility]  C-SSRS RISK CATEGORY Low Risk High Risk No Risk    Last PHQ 2/9 Scores:    09/24/2021   11:36 AM 09/19/2021    1:07 PM 08/20/2021   11:35 AM  Depression screen PHQ 2/9  Decreased Interest 3 0 0  Down, Depressed, Hopeless 3 0 0  PHQ - 2 Score 6 0 0  Altered sleeping 1 0 0  Tired, decreased energy 1 0 0  Change in appetite 0 0  0  Feeling bad or failure about yourself  3 0 0  Trouble concentrating 0 0 0  Moving slowly or fidgety/restless 0 0 0  Suicidal thoughts 1 0 0  PHQ-9 Score 12 0 0  Difficult doing work/chores Extremely dIfficult Not difficult at all Not difficult at all    Scribe for Treatment Team: Lum JONETTA Croft, [place] 04/01/2024 2:36 PM

## 2024-04-01 NOTE — H&P (Signed)
 Psychiatric Admission Assessment Adult  Patient Identification: Randall Harvey. MRN:  [id number] Date of Evaluation:  04/01/2024 Chief Complaint:  MDD (major depressive disorder), recurrent severe, without psychosis (HCC) [F33.2]   History of Present Illness: Randall Denomme. is a 63 y.o. male admitted: Medicallyfor 03/30/2024  9:50 PM with history of depression and chronic alcoholism with multiple ED visits in the month of June for alcohol intoxication, falls brought in by the cops after he was noted to be falling on the curb side on the road and made suicidal statements.  Psychiatry is consulted for safety evaluation.   Today on interview patient is noted to be resting in bed.  He reports feeling better.  When provider asked about his multiple ED visits with intoxication and falls he reports that he actually has been cutting down on his alcohol and has been doing well.  He minimizes his alcohol use and his altered mental status where he ended up in the emergency rooms for almost more than 8 times in the month of June 2025.  He reports that his support is his girlfriend and since she went to jail he was feeling little down.  He denies feeling depressed, denies hopeless or worthless, denies auditory/visual hallucinations.  He denies SI/HI/plan.  He has fair appetite and reports having problems with sleep.  When provider asked about his living situation he minimizes and states that he lives in a boardinghouse and declines needing any resources including rehab places after discharge.  He reports that his girlfriend is his main support but refuses to give the contact number for the girlfriend or any family member.  He denies feeling anxious or having panic attacks today.  He denies any recent or current episodes of mania/hypomania.  Total Time spent with patient: 1 hour Sleep  Sleep:Sleep: Fair  Past Psychiatric History:  Psychiatric History:  Information collected from Patient/chart  rev Dx/Sx: anxiety,  alcohol use Current Psych Provider: none Home Meds (current): none reported Previous Med Trials: unknown Therapy: denies   Prior Psych Hospitalization: [facility] 2024  Prior Self Harm: denies Prior Violence: denies   Family Psych History: denies Family Hx suicide: denies   Social History:  Developmental Hx: normal Educational Hx: 10th grade Occupational Hx: on disability Legal Hx: denies Living Situation: rooming Spiritual Hx: denies Access to weapons/lethal means: denies    Substance History Alcohol: pt minimizes  Type of alcohol beer Last Drink per pt 03/30/24 Number of drinks per day per pt 4 History of alcohol withdrawal seizures denies History of DT's denies Tobacco: 1PP/3days Illicit drugs: denies Prescription drug abuse: denies Rehab hx: none reported   Is the patient at risk to self? No.  Has the patient been a risk to self in the past 6 months? No.  Has the patient been a risk to self within the distant past? No.  Is the patient a risk to others? No.  Has the patient been a risk to others in the past 6 months? No.  Has the patient been a risk to others within the distant past? No.   [place] Scale:  Flowsheet Row Admission (Current) from 03/31/2024 in [facility] ED from 03/30/2024 in [facility] Emergency Department at [facility] ED from 03/28/2024 in [facility] Emergency Department at [facility]  C-SSRS RISK CATEGORY Low Risk High Risk No Risk     Past Medical History:  Past Medical History:  Diagnosis Date   Alcohol intoxication (HCC)    Anticoagulant long-term  use    lifetime use, coumadin therapy, then Xarelto    Anxiety    Arthritis    Cancer (HCC)    renal   Carotid atherosclerosis    <50% left and right   Clotting disorder (HCC)    COPD (chronic obstructive pulmonary disease) (HCC)    Depression    Elevated liver enzymes 11/2013   Family history of cancer    Fatty liver    H/O drug abuse (HCC)    H/O ETOH abuse     Hepatitis    History of traumatic brain injury    Hypertension    Lymphadenopathy    Obesity    Personal history of DVT (deep vein thrombosis) 06/2013   Pre-diabetes    Primary osteoarthritis of left knee    Pulmonary embolism and infarction (HCC) 06/2013   Pulmonary embolism and infarction (HCC) 11/2013   Pulmonary embolus with infarction [facility])    Renal cell carcinoma (HCC)    Renal mass, left 06/2013   with ureteral obstruction   Stroke (HCC)    Subarachnoid hemorrhage following injury (HCC)    Thrombocythemia    Thrombocytopenia (HCC)    Tobacco abuse     Past Surgical History:  Procedure Laterality Date   ANKLE FRACTURE SURGERY Right 12/2012   ANKLE SURGERY     EXCISION METACARPAL MASS Right 09/18/2016   Procedure: EXCISION METACARPAL MASS;  Surgeon: Ozell Flake, MD;  Location: [facility] ORS;  Service: Orthopedics;  Laterality: Right;  5th digit    HERNIA REPAIR     Umbilical Hernia   ORBITAL FRACTURE SURGERY Left    ROBOTIC ASSITED PARTIAL NEPHRECTOMY Left Oct 2014   Family History:  Family History  Problem Relation Age of Onset   Pancreatic cancer Mother    Colon cancer Father    Diabetes Father    Colon cancer Sister    COPD Sister    Seizures Sister    Ovarian cancer Sister    Cancer Paternal Grandmother        bone   Stroke Paternal Grandfather    Heart disease Paternal Grandfather    Hypertension Neg Hx     Social History:  Social History   Substance and Sexual Activity  Alcohol Use Yes   Alcohol/week: 12.0 standard drinks of alcohol   Types: 12 Cans of beer per week   Comment: alcohol abuse     Social History   Substance and Sexual Activity  Drug Use Yes   Types: Marijuana   Comment: occ      Allergies:  No Known Allergies Lab Results:  Results for orders placed or performed during the hospital encounter of 03/30/24 (from the past 48 hours)  CBC     Status: Abnormal   Collection Time: 03/30/24 10:09 PM  Result Value Ref Range   WBC  11.1 (H) 4.0 - 10.5 K/uL   RBC 3.89 (L) 4.22 - 5.81 MIL/uL   Hemoglobin 13.4 13.0 - 17.0 g/dL   HCT 61.1 (L) 60.9 - 47.9 %   MCV 99.7 80.0 - 100.0 fL   MCH 34.4 (H) 26.0 - 34.0 pg   MCHC 34.5 30.0 - 36.0 g/dL   RDW 85.1 88.4 - 84.4 %   Platelets 132 (L) 150 - 400 K/uL   nRBC 0.0 0.0 - 0.2 %    Comment: Performed at [facility], [address], [place], [place] [address]  Basic metabolic panel     Status: Abnormal   Collection Time: 03/30/24 10:09  PM  Result Value Ref Range   Sodium 141 135 - 145 mmol/L   Potassium 2.9 (L) 3.5 - 5.1 mmol/L   Chloride 110 98 - 111 mmol/L   CO2 21 (L) 22 - 32 mmol/L   Glucose, Bld 215 (H) 70 - 99 mg/dL    Comment: Glucose reference range applies only to samples taken after fasting for at least 8 hours.   BUN 15 8 - 23 mg/dL   Creatinine, Ser 9.00 0.61 - 1.24 mg/dL   Calcium 8.3 (L) 8.9 - 10.3 mg/dL   GFR, Estimated >39 >39 mL/min    Comment: (NOTE) Calculated using the CKD-EPI Creatinine Equation (2021)    Anion gap 10 5 - 15    Comment: Performed at [facility], [address] [place], [place] [address]  Ethanol     Status: Abnormal   Collection Time: 03/30/24 10:09 PM  Result Value Ref Range   Alcohol, Ethyl (B) 272 (H) <15 mg/dL    Comment: (NOTE) For medical purposes only. Performed at [facility], [address] [place], [place] [address]   SARS Coronavirus 2 by RT PCR (hospital order, performed in [facility] hospital lab) *cepheid single result test* Anterior Nasal Swab     Status: None   Collection Time: 03/31/24 11:53 AM   Specimen: Anterior Nasal Swab  Result Value Ref Range   SARS Coronavirus 2 by RT PCR NEGATIVE NEGATIVE    Comment: (NOTE) SARS-CoV-2 target nucleic acids are NOT DETECTED.  The SARS-CoV-2 RNA is generally detectable in upper and lower respiratory specimens during the acute phase of infection. The lowest concentration of SARS-CoV-2 viral copies this assay can detect is  250 copies / mL. A negative result does not preclude SARS-CoV-2 infection and should not be used as the sole basis for treatment or other patient management decisions.  A negative result may occur with improper specimen collection / handling, submission of specimen other than nasopharyngeal swab, presence of viral mutation(s) within the areas targeted by this assay, and inadequate number of viral copies (<250 copies / mL). A negative result must be combined with clinical observations, patient history, and epidemiological information.  Fact Sheet for Patients:   RoadLapTop.co.za  Fact Sheet for Healthcare Providers: http://kim-miller.com/  This test is not yet approved or  cleared by the [place]  FDA and has been authorized for detection and/or diagnosis of SARS-CoV-2 by FDA under an Emergency Use Authorization (EUA).  This EUA will remain in effect (meaning this test can be used) for the duration of the COVID-19 declaration under Section 564(b)(1) of the Act, 21 U.S.C. section 360bbb-3(b)(1), unless the authorization is terminated or revoked sooner.  Performed at [facility], [address] [place], [place] [address]   Urine Drug Screen, Qualitative [facility] only)     Status: Abnormal   Collection Time: 03/31/24  1:00 PM  Result Value Ref Range   Tricyclic, Ur Screen NONE DETECTED NONE DETECTED   Amphetamines, Ur Screen NONE DETECTED NONE DETECTED   MDMA (Ecstasy)Ur Screen NONE DETECTED NONE DETECTED   Cocaine Metabolite,Ur [place] NONE DETECTED NONE DETECTED   Opiate, Ur Screen NONE DETECTED NONE DETECTED   Phencyclidine (PCP) Ur S NONE DETECTED NONE DETECTED   Cannabinoid 50 Ng, Ur [place] NONE DETECTED NONE DETECTED   Barbiturates, Ur Screen NONE DETECTED NONE DETECTED   Benzodiazepine, Ur Scrn POSITIVE (A) NONE DETECTED   Methadone Scn, Ur NONE DETECTED NONE DETECTED    Comment: (NOTE) Tricyclics + metabolites, urine  Cutoff 1000 ng/mL Amphetamines + metabolites, urine  Cutoff 1000 ng/mL MDMA (Ecstasy), urine              Cutoff 500 ng/mL Cocaine Metabolite, urine          Cutoff 300 ng/mL Opiate + metabolites, urine        Cutoff 300 ng/mL Phencyclidine (PCP), urine         Cutoff 25 ng/mL Cannabinoid, urine                 Cutoff 50 ng/mL Barbiturates + metabolites, urine  Cutoff 200 ng/mL Benzodiazepine, urine              Cutoff 200 ng/mL Methadone, urine                   Cutoff 300 ng/mL  The urine drug screen provides only a preliminary, unconfirmed analytical test result and should not be used for non-medical purposes. Clinical consideration and professional judgment should be applied to any positive drug screen result due to possible interfering substances. A more specific alternate chemical method must be used in order to obtain a confirmed analytical result. Gas chromatography / mass spectrometry (GC/MS) is the preferred confirm atory method. Performed at [facility], [address] [place], [place] [address]   Urinalysis, Routine w reflex microscopic -Urine, Clean Catch     Status: Abnormal   Collection Time: 03/31/24  1:00 PM  Result Value Ref Range   Color, Urine YELLOW (A) YELLOW   APPearance TURBID (A) CLEAR   Specific Gravity, Urine 1.031 (H) 1.005 - 1.030   pH 5.0 5.0 - 8.0   Glucose, UA 150 (A) NEGATIVE mg/dL   Hgb urine dipstick NEGATIVE NEGATIVE   Bilirubin Urine NEGATIVE NEGATIVE   Ketones, ur NEGATIVE NEGATIVE mg/dL   Protein, ur 30 (A) NEGATIVE mg/dL   Nitrite NEGATIVE NEGATIVE   Leukocytes,Ua NEGATIVE NEGATIVE   RBC / HPF 0-5 0 - 5 RBC/hpf   WBC, UA 21-50 0 - 5 WBC/hpf   Bacteria, UA FEW (A) NONE SEEN   Squamous Epithelial / HPF 0 0 - 5 /HPF   Mucus PRESENT    Amorphous Crystal PRESENT     Comment: Performed at [facility], [address] [place], [place] [address]  Basic metabolic panel     Status: Abnormal   Collection Time: 03/31/24   1:50 PM  Result Value Ref Range   Sodium 139 135 - 145 mmol/L   Potassium 3.6 3.5 - 5.1 mmol/L   Chloride 106 98 - 111 mmol/L   CO2 25 22 - 32 mmol/L   Glucose, Bld 128 (H) 70 - 99 mg/dL    Comment: Glucose reference range applies only to samples taken after fasting for at least 8 hours.   BUN 14 8 - 23 mg/dL   Creatinine, Ser 9.24 0.61 - 1.24 mg/dL   Calcium 8.4 (L) 8.9 - 10.3 mg/dL   GFR, Estimated >39 >39 mL/min    Comment: (NOTE) Calculated using the CKD-EPI Creatinine Equation (2021)    Anion gap 8 5 - 15    Comment: Performed at [facility], [address] [place], [place] [address]    Blood Alcohol level:  Lab Results  Component Value Date   ETH 272 (H) 03/30/2024   ETH 217 (H) 03/28/2024    Metabolic Disorder Labs:  Lab Results  Component Value Date   HGBA1C 5.6 08/20/2021   MPG 114 08/20/2021   MPG  146 09/27/2018   No results found for: PROLACTIN Lab Results  Component Value Date   CHOL 169 09/19/2021   TRIG 307 (H) 09/19/2021   HDL 49 09/19/2021   CHOLHDL 3.4 09/19/2021   VLDL NOT CALC 08/07/2016   LDLCALC 83 09/19/2021   LDLCALC 98 09/27/2018    Current Medications: Current Facility-Administered Medications  Medication Dose Route Frequency Provider Last Rate Last Admin   acetaminophen  (TYLENOL ) tablet 650 mg  650 mg Oral Q6H PRN Meylin Stenzel, MD       alum & mag hydroxide-simeth (MAALOX/MYLANTA) 200-200-20 MG/5ML suspension 30 mL  30 mL Oral Q4H PRN Denicia Pagliarulo, MD       DULoxetine  (CYMBALTA ) DR capsule 30 mg  30 mg Oral Daily Caroll Weinheimer, MD   30 mg at 04/01/24 1218   LORazepam  (ATIVAN ) injection 0-4 mg  0-4 mg Intravenous Q6H Donnelly Mellow, MD       Or   LORazepam  (ATIVAN ) tablet 0-4 mg  0-4 mg Oral Q6H Krayton Wortley, MD   1 mg at 04/01/24 9377   magnesium  hydroxide (MILK OF MAGNESIA) suspension 30 mL  30 mL Oral Daily PRN Donnelly Mellow, MD       mirtazapine  (REMERON ) tablet 7.5 mg  7.5 mg Oral QHS Nerea Bordenave, MD        nicotine  (NICODERM CQ  - dosed in mg/24 hours) patch 14 mg  14 mg Transdermal Daily Drema Eddington, MD   14 mg at 04/01/24 9081   OLANZapine  (ZYPREXA ) injection 5 mg  5 mg Intramuscular TID PRN Emin Foree, MD       OLANZapine  zydis (ZYPREXA ) disintegrating tablet 5 mg  5 mg Oral TID PRN Breanna Mcdaniel, MD       thiamine  (VITAMIN B1) tablet 100 mg  100 mg Oral Daily Javana Schey, MD   100 mg at 04/01/24 9081   Or   thiamine  (VITAMIN B1) injection 100 mg  100 mg Intravenous Daily Denajah Farias, MD       traZODone  (DESYREL ) tablet 50 mg  50 mg Oral QHS PRN Jagdeep Ancheta, MD   50 mg at 03/31/24 2042   PTA Medications: Medications Prior to Admission  Medication Sig Dispense Refill Last Dose/Taking   DULoxetine  (CYMBALTA ) 30 MG capsule Take 1 capsule (30 mg total) by mouth daily. 30 capsule 0    mirtazapine  (REMERON ) 7.5 MG tablet Take 1 tablet (7.5 mg total) by mouth at bedtime. 30 tablet 0    traZODone  (DESYREL ) 50 MG tablet Take 1 tablet (50 mg total) by mouth at bedtime as needed for sleep. 30 tablet 0     Psychiatric Specialty Exam:  Presentation  General Appearance:  Appropriate for Environment; Casual  Eye Contact: Fair  Speech: Normal Rate  Speech Volume: Normal    Mood and Affect  Mood: Depressed; Anxious  Affect: Constricted   Thought Process  Thought Processes: Coherent  Descriptions of Associations:Intact  Orientation:Full (Time, Place and Person)  Thought Content:Illogical  Hallucinations:Hallucinations: None  Ideas of Reference:None  Suicidal Thoughts:Suicidal Thoughts: No  Homicidal Thoughts:Homicidal Thoughts: No   Sensorium  Memory: Immediate Fair; Recent Fair; Remote Poor  Judgment: Impaired  Insight: Shallow   Executive Functions  Concentration: Fair  Attention Span: Poor  Recall: Poor  Fund of Knowledge: Fair  Language: Fair   Psychomotor Activity  Psychomotor Activity: Psychomotor Activity:  Normal   Assets  Assets: Communication Skills; Desire for Improvement; Social Support; Resilience    Musculoskeletal: Strength & Muscle Tone: within normal limits Gait &  Station: normal  Physical Exam: Physical Exam Vitals and nursing note reviewed.  HENT:     Head: Normocephalic.  Cardiovascular:     Rate and Rhythm: Normal rate.  Neurological:     Mental Status: He is alert.    Review of Systems  Constitutional: Negative.   HENT: Negative.    Eyes: Negative.   Cardiovascular: Negative.   Skin: Negative.    Blood pressure (!) 147/82, pulse 78, temperature 97.7 F (36.5 C), temperature source Oral, resp. rate 20, height 5' 2 (1.575 m), weight 73.9 kg, SpO2 99%. Body mass index is 29.81 kg/m.  Principal Diagnosis: MDD (major depressive disorder), recurrent severe, without psychosis (HCC) Diagnosis:  Principal Problem:   MDD (major depressive disorder), recurrent severe, without psychosis (HCC)   Clinical Decision Making: Patient with history of depression, chronic pain, chronic alcoholism admitted for inpatient unit of her multiple ED visits recently with alcohol intoxication and during the current visit he made suicidal ideation statement.  Patient has multiple psychosocial stressors including unstable living condition, unstable relationship, minimizing his mental health and substance use problems, not having access to mental health services due to lack of transportation, noncompliance with treatment.  Treatment Plan Summary:  Safety and Monitoring:             -- Voluntary admission to inpatient psychiatric unit for safety, stabilization and treatment             -- Daily contact with patient to assess and evaluate symptoms and progress in treatment             -- Patient's case to be discussed in multi-disciplinary team meeting             -- Observation Level: q15 minute checks             -- Vital signs:  q12 hours             -- Precautions: suicide, elopement,  and assault   2. Psychiatric Diagnoses and Treatment:               Started on Cymbalta  30 mg daily to help with depression and chronic pain.  Plan to titrate the dose up   -- The risks/benefits/side-effects/alternatives to this medication were discussed in detail with the patient and time was given for questions. The patient consents to medication trial.                -- Metabolic profile and EKG monitoring obtained while on an atypical antipsychotic (BMI: Lipid Panel: HbgA1c: QTc:)              -- Encouraged patient to participate in unit milieu and in scheduled group therapies                            3. Medical Issues Being Addressed:  Patient is on CIWA protocol and will be monitored closely   4. Discharge Planning:              -- Social work and case management to assist with discharge planning and identification of hospital follow-up needs prior to discharge             -- Estimated LOS: 5-7 days             -- Discharge Concerns: Need to establish a safety plan; Medication compliance and effectiveness             --  Discharge Goals: Return home with outpatient referrals follow ups  Physician Treatment Plan for Primary Diagnosis: MDD (major depressive disorder), recurrent severe, without psychosis (HCC) Long Term Goal(s): Improvement in symptoms so as ready for discharge  Short Term Goals: Ability to identify changes in lifestyle to reduce recurrence of condition will improve, Ability to verbalize feelings will improve, Ability to disclose and discuss suicidal ideas, Ability to demonstrate self-control will improve, Ability to identify and develop effective coping behaviors will improve, and Ability to maintain clinical measurements within normal limits will improve  Physician Treatment Plan for Secondary Diagnosis: Principal Problem:   MDD (major depressive disorder), recurrent severe, without psychosis (HCC)  Long Term Goal(s): Improvement in symptoms so as ready for  discharge  Short Term Goals: Ability to identify changes in lifestyle to reduce recurrence of condition will improve, Ability to verbalize feelings will improve, Ability to disclose and discuss suicidal ideas, Ability to demonstrate self-control will improve, Ability to identify and develop effective coping behaviors will improve, and Ability to maintain clinical measurements within normal limits will improve  I certify that inpatient services furnished can reasonably be expected to improve the patient's condition.    Sakari Alkhatib, MD 7/4/20256:34 PM

## 2024-04-01 NOTE — Group Note (Unsigned)
 Date:  04/01/2024 Time:  2:11 PM  Group Topic/Focus:  Fresh air Therapy Outdoors with Music and conversation.     Participation Level:  {BHH PARTICIPATION OZCZO:77735}  Participation Quality:  {BHH PARTICIPATION QUALITY:22265}  Affect:  {BHH AFFECT:22266}  Cognitive:  {BHH COGNITIVE:22267}  Insight: {BHH Insight2:20797}  Engagement in Group:  {BHH ENGAGEMENT IN HMNLE:77731}  Modes of Intervention:  {BHH MODES OF INTERVENTION:22269}  Additional Comments:  ***  Norleen SHAUNNA Bias 04/01/2024, 2:11 PM

## 2024-04-01 NOTE — Group Note (Signed)
 Date:  04/01/2024 Time:  8:51 PM  Group Topic/Focus:  Developing a Wellness Toolbox:   The focus of this group is to help patients develop a wellness toolbox with skills and strategies to promote recovery upon discharge. Healthy Communication:   The focus of this group is to discuss communication, barriers to communication, as well as healthy ways to communicate with others. Self Care:   The focus of this group is to help patients understand the importance of self-care in order to improve or restore emotional, physical, spiritual, interpersonal, and financial health.    Participation Level:  Did Not Attend  Participation Quality:  Did not attend  Affect:  Did not attend  Cognitive:  did not attend  Insight: None  Engagement in Group:  did not attend  Modes of Intervention:  did not attend  Additional Comments:  did not attend  Randall Harvey 04/01/2024, 8:51 PM

## 2024-04-01 NOTE — Plan of Care (Signed)
  Problem: Education: Goal: Ability to make informed decisions regarding treatment will improve Outcome: Progressing   Problem: Coping: Goal: Coping ability will improve Outcome: Progressing   Problem: Health Behavior/Discharge Planning: Goal: Identification of resources available to assist in meeting health care needs will improve Outcome: Progressing   Problem: Medication: Goal: Compliance with prescribed medication regimen will improve Outcome: Progressing   Problem: Self-Concept: Goal: Ability to disclose and discuss suicidal ideas will improve Outcome: Progressing Goal: Will verbalize positive feelings about self Outcome: Progressing Note: Patient is on track. Patient will work on increased adherence

## 2024-04-01 NOTE — Group Note (Unsigned)
 Date:  04/01/2024 Time:  11:05 AM  Group Topic/Focus:  Libbie Therapy Outdoors with music and conversation     Participation Level:  {BHH PARTICIPATION OZCZO:77735}  Participation Quality:  {BHH PARTICIPATION QUALITY:22265}  Affect:  {BHH AFFECT:22266}  Cognitive:  {BHH COGNITIVE:22267}  Insight: {BHH Insight2:20797}  Engagement in Group:  {BHH ENGAGEMENT IN GROUP:22268}  Modes of Intervention:  {BHH MODES OF INTERVENTION:22269}  Additional Comments:  ***  Norleen SHAUNNA Bias 04/01/2024, 11:05 AM

## 2024-04-01 NOTE — Group Note (Signed)
 Date:  04/01/2024 Time:  2:19 PM  Group Topic/Focus:  Fresh air Therapy Outdoors with Music and conversation.    Participation Level:  Did Not Attend    Randall Harvey 04/01/2024, 2:19 PM

## 2024-04-02 DIAGNOSIS — F332 Major depressive disorder, recurrent severe without psychotic features: Secondary | ICD-10-CM | POA: Diagnosis not present

## 2024-04-02 MED ORDER — DULOXETINE HCL 30 MG PO CPEP
30.0000 mg | ORAL_CAPSULE | Freq: Two times a day (BID) | ORAL | Status: DC
Start: 2024-04-02 — End: 2024-04-04
  Administered 2024-04-02 – 2024-04-04 (×4): 30 mg via ORAL
  Filled 2024-04-02 (×4): qty 1

## 2024-04-02 NOTE — BHH Suicide Risk Assessment (Signed)
 BHH INPATIENT:  Family/Significant Other Suicide Prevention Education  Suicide Prevention Education:  Family/Significant Other Refusal to Support Patient after Discharge:  Suicide Prevention Education Not Provided:  Patient has identified home of family/significant other as the place the patient will be residing after discharge.  With written consent of the patient, two attempts were made to provide Suicide Prevention Education to Ar[place] sister [phone],   This person indicates he/she will not be responsible for the patient after discharge. CSW spoke to sister Arland, she just had back surgery and is unable to contract for safety or help support in any manner at this time Pamila Nine 04/02/2024,4:11 PM

## 2024-04-02 NOTE — Progress Notes (Signed)
   04/02/24 1200  Psych Admission Type (Psych Patients Only)  Admission Status Involuntary  Psychosocial Assessment  Patient Complaints None  Eye Contact Fair  Facial Expression Sad  Affect Sad  Speech Logical/coherent  Interaction Minimal  Motor Activity Slow  Appearance/Hygiene In scrubs  Behavior Characteristics Cooperative  Mood Sad  Thought Process  Coherency WDL  Content Preoccupation  Delusions None reported or observed  Perception WDL  Hallucination None reported or observed  Judgment Limited  Confusion WDL  Danger to Self  Current suicidal ideation? Denies  Danger to Others  Danger to Others None reported or observed

## 2024-04-02 NOTE — Progress Notes (Signed)
   04/02/24 1942  Psych Admission Type (Psych Patients Only)  Admission Status Involuntary  Psychosocial Assessment  Patient Complaints Anxiety;Depression  Eye Contact Fair  Facial Expression Animated  Affect Appropriate to circumstance  Speech Logical/coherent  Interaction Assertive  Motor Activity Slow  Appearance/Hygiene In scrubs  Behavior Characteristics Cooperative  Mood Pleasant  Thought Process  Coherency WDL  Content Preoccupation  Delusions None reported or observed  Perception WDL  Hallucination None reported or observed  Judgment Limited  Confusion WDL  Danger to Self  Current suicidal ideation? Denies  Danger to Others  Danger to Others None reported or observed

## 2024-04-02 NOTE — Group Note (Signed)
 Date:  04/02/2024 Time:  10:45 AM  Group Topic/Focus:  Fresh air Therapy with Music.    Participation Level:  Did Not Attend    Norleen SHAUNNA Bias 04/02/2024, 10:45 AM

## 2024-04-02 NOTE — Plan of Care (Signed)
  Problem: Education: Goal: Ability to make informed decisions regarding treatment will improve Outcome: Progressing   Problem: Coping: Goal: Coping ability will improve Outcome: Progressing   Problem: Health Behavior/Discharge Planning: Goal: Identification of resources available to assist in meeting health care needs will improve Outcome: Progressing   Problem: Medication: Goal: Compliance with prescribed medication regimen will improve Outcome: Progressing   Problem: Self-Concept: Goal: Ability to disclose and discuss suicidal ideas will improve Outcome: Progressing Goal: Will verbalize positive feelings about self Outcome: Progressing Note: Patient is not on track and improving. Patient will work on increased adherence, work toward meeting this goal by D/C date, and be monitored by provider to determine if a change in treatment plan is warranted

## 2024-04-02 NOTE — Progress Notes (Signed)
   04/01/24 2100  Psych Admission Type (Psych Patients Only)  Admission Status Involuntary  Psychosocial Assessment  Patient Complaints Anxiety;Agitation;Restlessness  Eye Contact Fair  Facial Expression Anxious  Affect Anxious  Speech Logical/coherent  Interaction Minimal  Motor Activity Slow  Appearance/Hygiene In scrubs  Behavior Characteristics Anxious;Restless  Mood Anxious  Thought Process  Coherency WDL  Content Preoccupation  Delusions None reported or observed  Perception WDL  Hallucination None reported or observed  Judgment Limited  Confusion WDL  Danger to Self  Current suicidal ideation? Denies  Danger to Others  Danger to Others None reported or observed   Provider made aware that pt on q6hr CIWAA last check was 1800 score was 4 no Ativan  given; pt up to nurses station with complaints of feeling anxious, headache, restless, agitated and CIWAA score 10. BP was also 178/87 HR 66. Ativan  is scheduled doses only no prns doses ordered and schedule dose not due again until 0000 04/02/24. Is it okay to give Ativan  1mg  now and would the Ativan  need to be re timed? Per provider give pt the 1800 dose that was not given. Pt required 1 mg of Ativan . Pt slept throughout the night. NAD. POC.

## 2024-04-02 NOTE — Group Note (Signed)
 Date:  04/02/2024 Time:  9:16 PM  Group Topic/Focus:  Overcoming Stress:   The focus of this group is to define stress and help patients assess their triggers.    Participation Level:  Active  Participation Quality:  Attentive  Affect:  Appropriate  Cognitive:  Appropriate  Insight: Good  Engagement in Group:  Engaged  Modes of Intervention:  Confrontation  Additional Comments:    Randall Harvey 04/02/2024, 9:16 PM

## 2024-04-02 NOTE — Plan of Care (Signed)
  Problem: Self-Concept: Goal: Ability to disclose and discuss suicidal ideas will improve Outcome: Progressing Goal: Will verbalize positive feelings about self Outcome: Progressing Note: Patient is
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
## 2024-04-03 DIAGNOSIS — F332 Major depressive disorder, recurrent severe without psychotic features: Secondary | ICD-10-CM | POA: Diagnosis not present

## 2024-04-03 NOTE — Progress Notes (Signed)
 [facility] MD Progress Note  04/03/2024 1:38 PM Randall Harvey.  MRN:  [id number]  Randall Harvey. is a 63 y.o. male admitted: Medicallyfor 03/30/2024 9:50 PM with history of depression and chronic alcoholism with multiple ED visits in the month of June for alcohol intoxication, falls brought in by the cops after he was noted to be falling on the curb side on the road and made suicidal statements. Psychiatry is consulted for safety evaluation.  Subjective:  Chart reviewed, case discussed in multidisciplinary meeting, patient seen during rounds.  Patient is noted to be resting in bed.  He woke up and talk to the provider.  He reports feeling better with increased dose of Cymbalta .  He reports his pain is well under control and his mood is improving.  He did talk about not having access to phone or transportation as may barriers in his life.  He reports that he is planning to get a decent phone sooner after discharge.  He denies SI/HI/plan and denies hallucinations.  Provider encouraged patient to talk with the social worker regarding his discharge planning appointments.  He denies having any nausea or vomiting but has slight tremors from the alcohol withdrawal.  Sleep: Fair  Appetite:  Fair  Past Psychiatric History: see h&P Family History:  Family History  Problem Relation Age of Onset   Pancreatic cancer Mother    Colon cancer Father    Diabetes Father    Colon cancer Sister    COPD Sister    Seizures Sister    Ovarian cancer Sister    Cancer Paternal Grandmother        bone   Stroke Paternal Grandfather    Heart disease Paternal Grandfather    Hypertension Neg Hx    Social History:  Social History   Substance and Sexual Activity  Alcohol Use Yes   Alcohol/week: 12.0 standard drinks of alcohol   Types: 12 Cans of beer per week   Comment: alcohol abuse     Social History   Substance and Sexual Activity  Drug Use Yes   Types: Marijuana   Comment: occ    Social History    Socioeconomic History   Marital status: Divorced    Spouse name: Not on file   Number of children: Not on file   Years of education: Not on file   Highest education level: Not on file  Occupational History   Not on file  Tobacco Use   Smoking status: Every Day    Current packs/day: 0.50    Average packs/day: 0.5 packs/day for 55.5 years (27.8 ttl pk-yrs)    Types: Cigarettes    Start date: 1970   Smokeless tobacco: Never  Vaping Use   Vaping status: Never Used  Substance and Sexual Activity   Alcohol use: Yes    Alcohol/week: 12.0 standard drinks of alcohol    Types: 12 Cans of beer per week    Comment: alcohol abuse   Drug use: Yes    Types: Marijuana    Comment: occ   Sexual activity: Yes  Other Topics Concern   Not on file  Social History Narrative   ** Merged History Encounter **       Social Drivers of Health   Financial Resource Strain: Not on file  Food Insecurity: No Food Insecurity (03/31/2024)   Hunger Vital Sign    Worried About Running Out of Food in the Last Year: Never true    Ran Out of Food  in the Last Year: Never true  Recent Concern: Food Insecurity - Food Insecurity Present (01/07/2024)   Hunger Vital Sign    Worried About Running Out of Food in the Last Year: Sometimes true    Ran Out of Food in the Last Year: Often true  Transportation Needs: Unmet Transportation Needs (03/31/2024)   PRAPARE - Administrator, Civil Service (Medical): Yes    Lack of Transportation (Non-Medical): Yes  Physical Activity: Not on file  Stress: Not on file  Social Connections: Socially Isolated (03/31/2024)   Social Connection and Isolation Panel    Frequency of Communication with Friends and Family: Never    Frequency of Social Gatherings with Friends and Family: Never    Attends Religious Services: 1 to 4 times per year    Active Member of Clubs or Organizations: No    Attends Engineer, structural: Never    Marital Status: Divorced   Past  Medical History:  Past Medical History:  Diagnosis Date   Alcohol intoxication (HCC)    Anticoagulant long-term use    lifetime use, coumadin therapy, then Xarelto    Anxiety    Arthritis    Cancer (HCC)    renal   Carotid atherosclerosis    <50% left and right   Clotting disorder (HCC)    COPD (chronic obstructive pulmonary disease) (HCC)    Depression    Elevated liver enzymes 11/2013   Family history of cancer    Fatty liver    H/O drug abuse (HCC)    H/O ETOH abuse    Hepatitis    History of traumatic brain injury    Hypertension    Lymphadenopathy    Obesity    Personal history of DVT (deep vein thrombosis) 06/2013   Pre-diabetes    Primary osteoarthritis of left knee    Pulmonary embolism and infarction (HCC) 06/2013   Pulmonary embolism and infarction (HCC) 11/2013   Pulmonary embolus with infarction [facility])    Renal cell carcinoma (HCC)    Renal mass, left 06/2013   with ureteral obstruction   Stroke (HCC)    Subarachnoid hemorrhage following injury (HCC)    Thrombocythemia    Thrombocytopenia (HCC)    Tobacco abuse     Past Surgical History:  Procedure Laterality Date   ANKLE FRACTURE SURGERY Right 12/2012   ANKLE SURGERY     EXCISION METACARPAL MASS Right 09/18/2016   Procedure: EXCISION METACARPAL MASS;  Surgeon: Ozell Flake, MD;  Location: [facility] ORS;  Service: Orthopedics;  Laterality: Right;  5th digit    HERNIA REPAIR     Umbilical Hernia   ORBITAL FRACTURE SURGERY Left    ROBOTIC ASSITED PARTIAL NEPHRECTOMY Left Oct 2014    Current Medications: Current Facility-Administered Medications  Medication Dose Route Frequency Provider Last Rate Last Admin   acetaminophen  (TYLENOL ) tablet 650 mg  650 mg Oral Q6H PRN Charlene Cowdrey, MD   650 mg at 04/03/24 1004   alum & mag hydroxide-simeth (MAALOX/MYLANTA) 200-200-20 MG/5ML suspension 30 mL  30 mL Oral Q4H PRN Tyrian Peart, MD       DULoxetine  (CYMBALTA ) DR capsule 30 mg  30 mg Oral BID Ilyse Tremain,  MD   30 mg at 04/03/24 1004   magnesium  hydroxide (MILK OF MAGNESIA) suspension 30 mL  30 mL Oral Daily PRN Donnelly Mellow, MD       mirtazapine  (REMERON ) tablet 7.5 mg  7.5 mg Oral QHS Rajat Staver, MD   7.5 mg at  04/02/24 2116   nicotine  (NICODERM CQ  - dosed in mg/24 hours) patch 14 mg  14 mg Transdermal Daily Jerran Tappan, MD   14 mg at 04/03/24 1004   OLANZapine  (ZYPREXA ) injection 5 mg  5 mg Intramuscular TID PRN Emmajean Ratledge, MD       OLANZapine  zydis (ZYPREXA ) disintegrating tablet 5 mg  5 mg Oral TID PRN Khiyan Crace, MD       thiamine  (VITAMIN B1) tablet 100 mg  100 mg Oral Daily Swain Acree, MD   100 mg at 04/03/24 1004   Or   thiamine  (VITAMIN B1) injection 100 mg  100 mg Intravenous Daily Djeneba Barsch, MD       traZODone  (DESYREL ) tablet 50 mg  50 mg Oral QHS PRN Balin Vandegrift, MD   50 mg at 04/02/24 2116    Lab Results:  No results found for this or any previous visit (from the past 48 hours).   Blood Alcohol level:  Lab Results  Component Value Date   ETH 272 (H) 03/30/2024   ETH 217 (H) 03/28/2024    Metabolic Disorder Labs: Lab Results  Component Value Date   HGBA1C 5.6 08/20/2021   MPG 114 08/20/2021   MPG 146 09/27/2018   No results found for: PROLACTIN Lab Results  Component Value Date   CHOL 169 09/19/2021   TRIG 307 (H) 09/19/2021   HDL 49 09/19/2021   CHOLHDL 3.4 09/19/2021   VLDL NOT CALC 08/07/2016   LDLCALC 83 09/19/2021   LDLCALC 98 09/27/2018    Physical Findings: AIMS:  , ,  ,  ,    CIWA:  CIWA-Ar Total: 10 COWS:      Psychiatric Specialty Exam:  Presentation  General Appearance:  Appropriate for Environment; Casual  Eye Contact: Fair  Speech: Normal Rate  Speech Volume: Normal    Mood and Affect  Mood: Anxious  Affect: Appropriate   Thought Process  Thought Processes: Coherent  Descriptions of Associations:Intact  Orientation:Full (Time, Place and Person)  Thought  Content:Logical  Hallucinations:Hallucinations: None   Ideas of Reference:None  Suicidal Thoughts:Suicidal Thoughts: No   Homicidal Thoughts:Homicidal Thoughts: No    Sensorium  Memory: Recent Fair; Immediate Fair; Remote Fair  Judgment: Fair  Insight: Fair   Art therapist  Concentration: Fair  Attention Span: Fair  Recall: [employer] of Knowledge: Fair  Language: Fair   Psychomotor Activity  Psychomotor Activity: Psychomotor Activity: Normal   Musculoskeletal: Strength & Muscle Tone: decreased Gait & Station: normal Assets  Assets: Manufacturing systems engineer; Desire for Improvement; Social Support    Physical Exam: Physical Exam Vitals and nursing note reviewed.    ROS Blood pressure (!) 163/83, pulse 74, temperature (!) 97.5 F (36.4 C), resp. rate 20, height 5' 2 (1.575 m), weight 73.9 kg, SpO2 98%. Body mass index is 29.81 kg/m.  Diagnosis: Principal Problem:   MDD (major depressive disorder), recurrent severe, without psychosis (HCC)   Clinical Decision Making: Patient with history of depression, chronic pain, chronic alcoholism admitted for inpatient unit of her multiple ED visits recently with alcohol intoxication and during the current visit he made suicidal ideation statement.  Patient has multiple psychosocial stressors including unstable living condition, unstable relationship, minimizing his mental health and substance use problems, not having access to mental health services due to lack of transportation, noncompliance with treatment.   Treatment Plan Summary:   Safety and Monitoring:             -- Voluntary admission to inpatient  psychiatric unit for safety, stabilization and treatment             -- Daily contact with patient to assess and evaluate symptoms and progress in treatment             -- Patient's case to be discussed in multi-disciplinary team meeting             -- Observation Level: q15 minute checks              -- Vital signs:  q12 hours             -- Precautions: suicide, elopement, and assault   2. Psychiatric Diagnoses and Treatment:                Cymbalta  30 mg increased to bid   -- The risks/benefits/side-effects/alternatives to this medication were discussed in detail with the patient and time was given for questions. The patient consents to medication trial.                -- Metabolic profile and EKG monitoring obtained while on an atypical antipsychotic (BMI: Lipid Panel: HbgA1c: QTc:)              -- Encouraged patient to participate in unit milieu and in scheduled group therapies                            3. Medical Issues Being Addressed:  Patient is on CIWA protocol and will be monitored closely    4. Discharge Planning:   -- Social work and case management to assist with discharge planning and identification of hospital follow-up needs prior to discharge  -- Estimated LOS: 3-4 days  Mekenzie Modeste, MD 04/03/2024, 1:38 PM

## 2024-04-03 NOTE — Progress Notes (Signed)
 Patient expected to discharge tomorrow July 7.

## 2024-04-03 NOTE — Progress Notes (Signed)
   04/03/24 1300  Psych Admission Type (Psych Patients Only)  Admission Status Involuntary  Psychosocial Assessment  Patient Complaints Depression  Eye Contact Fair  Facial Expression Sad  Affect Sad  Speech Logical/coherent  Interaction Minimal  Motor Activity Slow  Appearance/Hygiene In scrubs  Behavior Characteristics Cooperative  Mood Pleasant  Thought Process  Coherency WDL  Content Preoccupation  Delusions None reported or observed  Perception WDL  Hallucination None reported or observed  Judgment Limited  Confusion WDL  Danger to Self  Current suicidal ideation? Denies  Danger to Others  Danger to Others None reported or observed

## 2024-04-03 NOTE — Group Note (Signed)
 Date:  04/03/2024 Time:  9:32 PM  Group Topic/Focus:  Wrap-Up Group:   The focus of this group is to help patients review their daily goal of treatment and discuss progress on daily workbooks.    Participation Level:  Active  Participation Quality:  Appropriate  Affect:  Appropriate  Cognitive:  Appropriate  Insight: Appropriate  Engagement in Group:  Engaged  Modes of Intervention:  Discussion  Additional Comments:    Randall Harvey Bunker 04/03/2024, 9:32 PM

## 2024-04-03 NOTE — Group Note (Signed)
 Date:  04/03/2024 Time:  11:09 AM  Group Topic/Focus:  Goals Group:   The focus of this group is to help patients establish daily goals to achieve during treatment and discuss how the patient can incorporate goal setting into their daily lives to aide in recovery.    Participation Level:  Did Not Attend   Arland Nutting 04/03/2024, 11:09 AM

## 2024-04-03 NOTE — Group Note (Signed)
 LCSW Group Therapy Note  Group Date: 04/03/2024 Start Time: 1400 End Time: 1445   Type of Therapy and Topic:  Group Therapy - Healthy vs Unhealthy Coping Skills  Participation Level:  Active   Description of Group The focus of this group was to determine what unhealthy coping techniques typically are used by group members and what healthy coping techniques would be helpful in coping with various problems. Patients were guided in becoming aware of the differences between healthy and unhealthy coping techniques. Patients were asked to identify 2-3 healthy coping skills they would like to learn to use more effectively.  Therapeutic Goals Patients learned that coping is what human beings do all day long to deal with various situations in their lives Patients defined and discussed healthy vs unhealthy coping techniques Patients identified their preferred coping techniques and identified whether these were healthy or unhealthy Patients determined 2-3 healthy coping skills they would like to become more familiar with and use more often. Patients provided support and ideas to each other   Summary of Patient Progress:  During group, patient proved open to input from peers and feedback from CSW. Patient demonstrated positive insight into the subject matter, was respectful of peers, and participated throughout the entire session.   Therapeutic Modalities Cognitive Behavioral Therapy Motivational Interviewing  Aldo CHRISTELLA Niece, [place] 04/03/2024  3:00 PM

## 2024-04-03 NOTE — Plan of Care (Signed)
  Problem: Education: Goal: Ability to state activities that reduce stress will improve 04/03/2024 1639 by Delores Garen GRADE, RN Outcome: Progressing 04/03/2024 1638 by Delores Garen GRADE, RN Outcome: Progressing   Problem: Coping: Goal: Ability to identify and develop effective coping behavior will improve 04/03/2024 1639 by Delores Garen GRADE, RN Outcome: Progressing 04/03/2024 1638 by Delores Garen GRADE, RN Outcome: Progressing

## 2024-04-03 NOTE — Plan of Care (Signed)
   Problem: Education: Goal: Ability to state activities that reduce stress will improve Outcome: Progressing   Problem: Coping: Goal: Ability to identify and develop effective coping behavior will improve Outcome: Progressing

## 2024-04-04 DIAGNOSIS — F332 Major depressive disorder, recurrent severe without psychotic features: Secondary | ICD-10-CM | POA: Diagnosis not present

## 2024-04-04 LAB — HEMOGLOBIN A1C
Hgb A1c MFr Bld: 5.5 % (ref 4.8–5.6)
Mean Plasma Glucose: 111.15 mg/dL

## 2024-04-04 LAB — LIPID PANEL
Cholesterol: 157 mg/dL (ref 0–200)
HDL: 39 mg/dL — ABNORMAL LOW (ref >40)
LDL Cholesterol: 83 mg/dL (ref 0–99)
Total CHOL/HDL Ratio: 4 ratio
Triglycerides: 177 mg/dL — ABNORMAL HIGH (ref <150)
VLDL: 35 mg/dL (ref 0–40)

## 2024-04-04 MED ORDER — VITAMIN B-1 100 MG PO TABS: 100.0000 mg | ORAL_TABLET | Freq: Every day | ORAL | 0 refills | Status: DC

## 2024-04-04 MED ORDER — DULOXETINE HCL 30 MG PO CPEP: 30.0000 mg | ORAL_CAPSULE | Freq: Two times a day (BID) | ORAL | 0 refills | Status: DC

## 2024-04-04 MED ORDER — NICOTINE 14 MG/24HR TD PT24: 14.0000 mg | MEDICATED_PATCH | Freq: Every day | TRANSDERMAL | 0 refills | Status: DC

## 2024-04-04 NOTE — Discharge Summary (Signed)
 Physician Discharge Summary Note  Patient:  Randall Eckhardt. is an 63 y.o., male MRN:  [id number] DOB:  1961/06/15 Patient phone:  [phone] (home)  Patient address:   [address] [place] [address],    Date of Admission:  03/31/2024 Date of Discharge: 04/04/24  Reason for Admission:  Randall Retz. is a 63 y.o. male admitted: Medicallyfor 03/30/2024 9:50 PM with history of depression and chronic alcoholism with multiple ED visits in the month of June for alcohol intoxication, falls brought in by the cops after he was noted to be falling on the curb side on the road and made suicidal statements. Psychiatry is consulted for safety evaluation. Patient is admitted to [facility] unit with Q15 min safety monitoring. Multidisciplinary team approach is offered. Medication management; group/milieu therapy is offered.   Principal Problem: MDD (major depressive disorder), recurrent severe, without psychosis (HCC) Discharge Diagnoses: Principal Problem:   MDD (major depressive disorder), recurrent severe, without psychosis (HCC)   Past Psychiatric History: see h&p  Family Psychiatric  History: see h&p Social History:  Social History   Substance and Sexual Activity  Alcohol Use Yes   Alcohol/week: 12.0 standard drinks of alcohol   Types: 12 Cans of beer per week   Comment: alcohol abuse     Social History   Substance and Sexual Activity  Drug Use Yes   Types: Marijuana   Comment: occ    Social History   Socioeconomic History   Marital status: Divorced    Spouse name: Not on file   Number of children: Not on file   Years of education: Not on file   Highest education level: Not on file  Occupational History   Not on file  Tobacco Use   Smoking status: Every Day    Current packs/day: 0.50    Average packs/day: 0.5 packs/day for 55.5 years (27.8 ttl pk-yrs)    Types: Cigarettes    Start date: 1970   Smokeless tobacco: Never  Vaping Use   Vaping status: Never Used   Substance and Sexual Activity   Alcohol use: Yes    Alcohol/week: 12.0 standard drinks of alcohol    Types: 12 Cans of beer per week    Comment: alcohol abuse   Drug use: Yes    Types: Marijuana    Comment: occ   Sexual activity: Yes  Other Topics Concern   Not on file  Social History Narrative   ** Merged History Encounter **       Social Drivers of Corporate investment banker Strain: Not on file  Food Insecurity: No Food Insecurity (03/31/2024)   Hunger Vital Sign    Worried About Running Out of Food in the Last Year: Never true    Ran Out of Food in the Last Year: Never true  Recent Concern: Food Insecurity - Food Insecurity Present (01/07/2024)   Hunger Vital Sign    Worried About Running Out of Food in the Last Year: Sometimes true    Ran Out of Food in the Last Year: Often true  Transportation Needs: Unmet Transportation Needs (03/31/2024)   PRAPARE - Administrator, Civil Service (Medical): Yes    Lack of Transportation (Non-Medical): Yes  Physical Activity: Not on file  Stress: Not on file  Social Connections: Socially Isolated (03/31/2024)   Social Connection and Isolation Panel    Frequency of Communication with Friends and Family: Never    Frequency of Social Gatherings with  Friends and Family: Never    Attends Religious Services: 1 to 4 times per year    Active Member of Clubs or Organizations: No    Attends Engineer, structural: Never    Marital Status: Divorced   Past Medical History:  Past Medical History:  Diagnosis Date   Alcohol intoxication (HCC)    Anticoagulant long-term use    lifetime use, coumadin therapy, then Xarelto    Anxiety    Arthritis    Cancer (HCC)    renal   Carotid atherosclerosis    <50% left and right   Clotting disorder (HCC)    COPD (chronic obstructive pulmonary disease) (HCC)    Depression    Elevated liver enzymes 11/2013   Family history of cancer    Fatty liver    H/O drug abuse (HCC)    H/O ETOH  abuse    Hepatitis    History of traumatic brain injury    Hypertension    Lymphadenopathy    Obesity    Personal history of DVT (deep vein thrombosis) 06/2013   Pre-diabetes    Primary osteoarthritis of left knee    Pulmonary embolism and infarction (HCC) 06/2013   Pulmonary embolism and infarction (HCC) 11/2013   Pulmonary embolus with infarction [facility])    Renal cell carcinoma (HCC)    Renal mass, left 06/2013   with ureteral obstruction   Stroke (HCC)    Subarachnoid hemorrhage following injury (HCC)    Thrombocythemia    Thrombocytopenia (HCC)    Tobacco abuse     Past Surgical History:  Procedure Laterality Date   ANKLE FRACTURE SURGERY Right 12/2012   ANKLE SURGERY     EXCISION METACARPAL MASS Right 09/18/2016   Procedure: EXCISION METACARPAL MASS;  Surgeon: Ozell Flake, MD;  Location: [facility] ORS;  Service: Orthopedics;  Laterality: Right;  5th digit    HERNIA REPAIR     Umbilical Hernia   ORBITAL FRACTURE SURGERY Left    ROBOTIC ASSITED PARTIAL NEPHRECTOMY Left Oct 2014   Family History:  Family History  Problem Relation Age of Onset   Pancreatic cancer Mother    Colon cancer Father    Diabetes Father    Colon cancer Sister    COPD Sister    Seizures Sister    Ovarian cancer Sister    Cancer Paternal Grandmother        bone   Stroke Paternal Grandfather    Heart disease Paternal Grandfather    Hypertension Neg Hx     Hospital Course:  Randall Salo. is a 63 y.o. male admitted: Medicallyfor 03/30/2024 9:50 PM with history of depression and chronic alcoholism with multiple ED visits in the month of June for alcohol intoxication, falls brought in by the cops after he was noted to be falling on the curb side on the road and made suicidal statements. Psychiatry is consulted for safety evaluation. Patient is admitted to [facility] unit with Q15 min safety monitoring. Multidisciplinary team approach is offered. Medication management; group/milieu therapy is offered.   Detailed risk assessment is complete based on clinical exam and individual risk factors and acute suicide risk is low and acute violence risk is low.   On admission patient is started back on his Cymbalta  30 mg daily which was titrated to 30 mg twice daily.  Patient was put on CIWA protocol with Ativan  as needed for withdrawals from alcohol.  Patient tolerated medications very well .  His mood improved significantly with  Cymbalta  and participated in groups.  He maintained safe behaviors on the unit.  On the day of discharge he consistently denied SI/HI/plan and hallucinations.  He remains future oriented and is willing to participate in outpatient mental health services.  We offered substance use rehab program options but he declined at this time.  Social worker tried to work with him in setting of more intensive outpatient services but patient seems to be not interested in all of those.  Currently, all modifiable risk of harm to self/harm to others have been addressed and patient is no longer appropriate for the acute inpatient setting and is able to continue treatment for mental health needs in the community with the supports as indicated below.  Patient is educated and verbalized understanding of discharge plan of care including medications, follow-up appointments, mental health resources and further crisis services in the community.  He is instructed to call 911 or present to the nearest emergency room should he experience any decompensation in mood, disturbance of bowel or return of suicidal/homicidal ideations.  Patient verbalizes understanding of this education and agrees to this plan of care  Physical Findings: AIMS:  , ,  ,  ,    CIWA:  CIWA-Ar Total: 10 COWS:        Psychiatric Specialty Exam:  Presentation  General Appearance:  Appropriate for Environment; Casual  Eye Contact: Fair  Speech: Clear and Coherent  Speech Volume: Normal    Mood and Affect   Mood: Euthymic  Affect: Appropriate   Thought Process  Thought Processes: Coherent  Descriptions of Associations:Intact  Orientation:Full (Time, Place and Person)  Thought Content:Logical  Hallucinations:Hallucinations: None  Ideas of Reference:None  Suicidal Thoughts:Suicidal Thoughts: No  Homicidal Thoughts:Homicidal Thoughts: No   Sensorium  Memory: Immediate Fair; Recent Fair; Remote Fair  Judgment: Fair  Insight: Fair   Art therapist  Concentration: Fair  Attention Span: Fair  Recall: [employer] of Knowledge: Fair  Language: Fair   Psychomotor Activity  Psychomotor Activity: Psychomotor Activity: Normal  Musculoskeletal: Strength & Muscle Tone: within normal limits Gait & Station: normal Assets  Assets: Manufacturing systems engineer; Desire for Improvement; Resilience; Social Support   Sleep  Sleep: Sleep: Fair    Physical Exam: Physical Exam ROS Blood pressure 127/76, pulse 66, temperature (!) 97.4 F (36.3 C), resp. rate 16, height 5' 2 (1.575 m), weight 73.9 kg, SpO2 97%. Body mass index is 29.81 kg/m.   Social History   Tobacco Use  Smoking Status Every Day   Current packs/day: 0.50   Average packs/day: 0.5 packs/day for 55.5 years (27.8 ttl pk-yrs)   Types: Cigarettes   Start date: 1970  Smokeless Tobacco Never   Tobacco Cessation:  A prescription for an FDA-approved tobacco cessation medication provided at discharge   Blood Alcohol level:  Lab Results  Component Value Date   ETH 272 (H) 03/30/2024   ETH 217 (H) 03/28/2024    Metabolic Disorder Labs:  Lab Results  Component Value Date   HGBA1C 5.6 08/20/2021   MPG 114 08/20/2021   MPG 146 09/27/2018   No results found for: PROLACTIN Lab Results  Component Value Date   CHOL 157 04/04/2024   TRIG 177 (H) 04/04/2024   HDL 39 (L) 04/04/2024   CHOLHDL 4.0 04/04/2024   VLDL 35 04/04/2024   LDLCALC 83 04/04/2024   LDLCALC 83 09/19/2021    See  Psychiatric Specialty Exam and Suicide Risk Assessment completed by Attending Physician prior to discharge.  Discharge destination:  Home  Is patient on multiple antipsychotic therapies at discharge:  No   Has Patient had three or more failed trials of antipsychotic monotherapy by history:  No  Recommended Plan for Multiple Antipsychotic Therapies: NA  Discharge Instructions     Diet - low sodium heart healthy   Complete by: As directed    Increase activity slowly   Complete by: As directed       Allergies as of 04/04/2024   No Known Allergies      Medication List     TAKE these medications      Indication  DULoxetine  30 MG capsule Commonly known as: CYMBALTA  Take 1 capsule (30 mg total) by mouth 2 (two) times daily. What changed: when to take this    mirtazapine  7.5 MG tablet Commonly known as: REMERON  Take 1 tablet (7.5 mg total) by mouth at bedtime.    nicotine  14 mg/24hr patch Commonly known as: NICODERM CQ  - dosed in mg/24 hours Place 1 patch (14 mg total) onto the skin daily.    thiamine  100 MG tablet Commonly known as: Vitamin B-1 Take 1 tablet (100 mg total) by mouth daily.    traZODone  50 MG tablet Commonly known as: DESYREL  Take 1 tablet (50 mg total) by mouth at bedtime as needed for sleep.         Follow-up Information     [facility], [facility]. Go to.   Contact information: [address] [place] [address] [phone]                 Follow-up recommendations:  Activity:  As tolerated    Signed: Dinisha Cai, MD 04/04/2024, 9:38 AM

## 2024-04-04 NOTE — Group Note (Signed)
 Date:  04/04/2024 Time:  10:29 AM  Group Topic/Focus:  Coping With Mental Health Crisis:   The purpose of this group is to help patients identify strategies for coping with mental health crisis.  Group discusses possible causes of crisis and ways to manage them effectively.    Participation Level:  Did Not Attend  Participation Quality:    Affect:    Cognitive:    Insight:   Engagement in Group:    Modes of Intervention:    Additional Comments:    Dilan Novosad 04/04/2024, 10:29 AM

## 2024-04-04 NOTE — Progress Notes (Signed)
   04/03/24 2300  Psych Admission Type (Psych Patients Only)  Admission Status Involuntary  Psychosocial Assessment  Patient Complaints Depression  Eye Contact Fair  Facial Expression Sad  Affect Sad  Speech Logical/coherent  Interaction Minimal  Motor Activity Slow  Appearance/Hygiene In scrubs  Behavior Characteristics Cooperative  Mood Pleasant  Thought Process  Coherency WDL  Content Preoccupation  Delusions None reported or observed  Perception WDL  Hallucination None reported or observed  Judgment Limited  Confusion WDL  Danger to Self  Current suicidal ideation? Denies  Danger to Others  Danger to Others None reported or observed

## 2024-04-04 NOTE — Plan of Care (Signed)
   Problem: Education: Goal: Ability to make informed decisions regarding treatment will improve Outcome: Progressing   Problem: Coping: Goal: Coping ability will improve Outcome: Progressing   Problem: Health Behavior/Discharge Planning: Goal: Identification of resources available to assist in meeting health care needs will improve Outcome: Progressing

## 2024-04-04 NOTE — Plan of Care (Signed)
 Patient is discharge today and escorted with belongings and paperwork to the exit and the awaiting taxi.

## 2024-04-04 NOTE — Care Management Important Message (Signed)
 Important Message  Patient Details  Name: Randall Harvey. MRN: [id number] Date of Birth: [birth year]   Important Message Given:  Yes - Medicare IM     Sherryle JINNY Margo, LCSW 04/04/2024, 11:41 AM

## 2024-04-04 NOTE — Progress Notes (Signed)
  [facility] Adult Case Management Discharge Plan :  Will you be returning to the same living situation after discharge:  Yes,  pt reports that she is returning home.  At discharge, do you have transportation home?: Yes,  CSW to assist with transportation needs.  Do you have the ability to pay for your medications: Yes,  [employer] MEDICARE / HU[employer]NA MEDICARE HMO  Release of information consent forms completed and in the chart;  Patient's signature needed at discharge.  Patient to Follow up at:  Follow-up Information     [facility] Follow up.   Why: Please contact them for follow up aftercare appointments. Contact information: [address]  [address] [place] [address] [phone]                 Next level of care provider has access to [facility] Link:no  Safety Planning and Suicide Prevention discussed: Yes,  SPE completed with the patient's guardian.      Has patient been referred to the Quitline?: Patient refused referral for treatment  Patient has been referred for addiction treatment: Patient refused referral for treatment.  Sherryle JINNY Margo, LCSW 04/04/2024, 11:29 AM

## 2024-04-04 NOTE — BHH Suicide Risk Assessment (Signed)
 [facility] Discharge Suicide Risk Assessment   Principal Problem: MDD (major depressive disorder), recurrent severe, without psychosis (HCC) Discharge Diagnoses: Principal Problem:   MDD (major depressive disorder), recurrent severe, without psychosis (HCC)   Total Time spent with patient: 30 minutes  Musculoskeletal: Strength & Muscle Tone: within normal limits Gait & Station: normal Patient leans: N/A  Psychiatric Specialty Exam  Presentation  General Appearance:  Appropriate for Environment; Casual  Eye Contact: Fair  Speech: Clear and Coherent  Speech Volume: Normal  Handedness: Right   Mood and Affect  Mood: Euthymic  Duration of Depression Symptoms: Greater than two weeks  Affect: Appropriate   Thought Process  Thought Processes: Coherent  Descriptions of Associations:Intact  Orientation:Full (Time, Place and Person)  Thought Content:Logical  History of Schizophrenia/Schizoaffective disorder:No  Duration of Psychotic Symptoms:N/A  Hallucinations:Hallucinations: None  Ideas of Reference:None  Suicidal Thoughts:Suicidal Thoughts: No  Homicidal Thoughts:Homicidal Thoughts: No   Sensorium  Memory: Immediate Fair; Recent Fair; Remote Fair  Judgment: Fair  Insight: Fair   Art therapist  Concentration: Fair  Attention Span: Fair  Recall: [employer] of Knowledge: Fair  Language: Fair   Psychomotor Activity  Psychomotor Activity: Psychomotor Activity: Normal   Assets  Assets: Communication Skills; Desire for Improvement; Resilience; Social Support   Sleep  Sleep: Sleep: Fair  Estimated Sleeping Duration (Last 24 Hours): 8.75-10.00 hours  Physical Exam: Physical Exam ROS Blood pressure 127/76, pulse 66, temperature (!) 97.4 F (36.3 C), resp. rate 16, height 5' 2 (1.575 m), weight 73.9 kg, SpO2 97%. Body mass index is 29.81 kg/m.  Mental Status Per Nursing Assessment::   On Admission:   NA  Demographic Factors:  Caucasian  Loss Factors: Decrease in vocational status  Historical Factors: Impulsivity  Risk Reduction Factors:   NA  Continued Clinical Symptoms:  Depression:   Comorbid alcohol abuse/dependence  Cognitive Features That Contribute To Risk:  None    Suicide Risk:  Minimal: No identifiable suicidal ideation.  Patients presenting with no risk factors but with morbid ruminations; may be classified as minimal risk based on the severity of the depressive symptoms   Follow-up Information     [facility], [facility]. Go to.   Contact information: [address] [place] [address] [phone]                 Plan Of Care/Follow-up recommendations:  Activity:  As tolerated  Allyn Foil, MD 04/04/2024, 9:36 AM

## 2024-04-19 NOTE — Progress Notes (Signed)
 Presented altered with slurred speech. UDS and EtOH ordered for further assess possible etiologies.

## 2024-04-22 ENCOUNTER — Emergency Department
Admission: EM | Admit: 2024-04-22 | Discharge: 2024-04-22 | Discharge status: Against Medical Advice | Attending: Emergency Medicine | Admitting: Emergency Medicine

## 2024-04-22 ENCOUNTER — Other Ambulatory Visit: Payer: Self-pay

## 2024-04-22 DIAGNOSIS — R55 Syncope and collapse: Secondary | ICD-10-CM | POA: Diagnosis present

## 2024-04-22 DIAGNOSIS — Z5321 Procedure and treatment not carried out due to patient leaving prior to being seen by health care provider: Secondary | ICD-10-CM | POA: Insufficient documentation

## 2024-04-22 LAB — COMPREHENSIVE METABOLIC PANEL WITH GFR
ALT: 18 U/L (ref 0–44)
AST: 30 U/L (ref 15–41)
Albumin: 3.8 g/dL (ref 3.5–5.0)
Alkaline Phosphatase: 77 U/L (ref 38–126)
Anion gap: 14 (ref 5–15)
BUN: 10 mg/dL (ref 8–23)
CO2: 24 mmol/L (ref 22–32)
Calcium: 8.9 mg/dL (ref 8.9–10.3)
Chloride: 106 mmol/L (ref 98–111)
Creatinine, Ser: 0.92 mg/dL (ref 0.61–1.24)
GFR, Estimated: >60 mL/min (ref >60)
Glucose, Bld: 105 mg/dL — ABNORMAL HIGH (ref 70–99)
Potassium: 3.4 mmol/L — ABNORMAL LOW (ref 3.5–5.1)
Sodium: 144 mmol/L (ref 135–145)
Total Bilirubin: 1.1 mg/dL (ref 0.0–1.2)
Total Protein: 6.7 g/dL (ref 6.5–8.1)

## 2024-04-22 LAB — CBC
HCT: 44.9 % (ref 39.0–52.0)
Hemoglobin: 15 g/dL (ref 13.0–17.0)
MCH: 33.5 pg (ref 26.0–34.0)
MCHC: 33.4 g/dL (ref 30.0–36.0)
MCV: 100.2 fL — ABNORMAL HIGH (ref 80.0–100.0)
Platelets: 133 K/uL — ABNORMAL LOW (ref 150–400)
RBC: 4.48 MIL/uL (ref 4.22–5.81)
RDW: 14.6 % (ref 11.5–15.5)
WBC: 11.6 K/uL — ABNORMAL HIGH (ref 4.0–10.5)
nRBC: 0 % (ref 0.0–0.2)

## 2024-04-22 NOTE — ED Notes (Signed)
Called 3x for treatment room. Eloped from waiting area.  

## 2024-04-22 NOTE — ED Triage Notes (Signed)
 C/O syncopal episode in front of [place] [facility] after standing in the sun for about an hour.  Admits to drinking a pint wine today.

## 2024-04-22 NOTE — ED Triage Notes (Signed)
 First nurse: Pt found outside on the ground. Admitted to etoh use. Unknown if he fell.   RBB on EKG  CBG 130 98 oral  HR 70

## 2024-05-02 ENCOUNTER — Emergency Department

## 2024-05-02 ENCOUNTER — Other Ambulatory Visit: Payer: Self-pay

## 2024-05-02 ENCOUNTER — Emergency Department
Admission: EM | Admit: 2024-05-02 | Discharge: 2024-05-02 | Disposition: A | Discharge status: Home / Self Care | Attending: Emergency Medicine | Admitting: Emergency Medicine

## 2024-05-02 DIAGNOSIS — S0031XA Abrasion of nose, initial encounter: Secondary | ICD-10-CM | POA: Diagnosis not present

## 2024-05-02 DIAGNOSIS — S00212A Abrasion of left eyelid and periocular area, initial encounter: Secondary | ICD-10-CM | POA: Insufficient documentation

## 2024-05-02 DIAGNOSIS — F10920 Alcohol use, unspecified with intoxication, uncomplicated: Secondary | ICD-10-CM

## 2024-05-02 DIAGNOSIS — Y92410 Unspecified street and highway as the place of occurrence of the external cause: Secondary | ICD-10-CM | POA: Insufficient documentation

## 2024-05-02 DIAGNOSIS — R519 Headache, unspecified: Secondary | ICD-10-CM | POA: Insufficient documentation

## 2024-05-02 DIAGNOSIS — W19XXXA Unspecified fall, initial encounter: Secondary | ICD-10-CM

## 2024-05-02 DIAGNOSIS — W1830XA Fall on same level, unspecified, initial encounter: Secondary | ICD-10-CM | POA: Diagnosis not present

## 2024-05-02 DIAGNOSIS — S0992XA Unspecified injury of nose, initial encounter: Secondary | ICD-10-CM | POA: Diagnosis present

## 2024-05-02 DIAGNOSIS — F10120 Alcohol abuse with intoxication, uncomplicated: Secondary | ICD-10-CM | POA: Insufficient documentation
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
## 2024-05-02 NOTE — ED Notes (Signed)
 Pt in CT.
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
## 2024-05-02 NOTE — ED Notes (Signed)
 Pt given snack and drink at this time. Pt calm and cooperative.

## 2024-05-02 NOTE — Discharge Instructions (Signed)
 Please use ibuprofen (Motrin) up to 800 mg every 8 hours, naproxen (Naprosyn) up to 500 mg every 12 hours, and/or acetaminophen (Tylenol) up to 4 g/day for any continued pain.  Please do not use this medication regimen for longer than 7 days

## 2024-05-02 NOTE — ED Notes (Signed)
 Patient discharged to home/streets. D/c papers provided and reviewed. Dr. Jossie called sister for pickup but unable to pick up patient d/t having taken sleeping meds. Patient ambulated to lobby and states he will use lobby phone to try to find a ride
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
## 2024-05-08 ENCOUNTER — Emergency Department

## 2024-05-08 ENCOUNTER — Other Ambulatory Visit: Payer: Self-pay

## 2024-05-08 ENCOUNTER — Emergency Department: Admission: EM | Admit: 2024-05-08 | Discharge: 2024-05-09 | Disposition: A | Discharge status: Home / Self Care

## 2024-05-08 ENCOUNTER — Encounter: Payer: Self-pay | Admitting: Emergency Medicine

## 2024-05-08 DIAGNOSIS — F10120 Alcohol abuse with intoxication, uncomplicated: Secondary | ICD-10-CM | POA: Diagnosis not present

## 2024-05-08 DIAGNOSIS — T50916A Underdosing of multiple unspecified drugs, medicaments and biological substances, initial encounter: Secondary | ICD-10-CM | POA: Insufficient documentation

## 2024-05-08 DIAGNOSIS — S00211A Abrasion of right eyelid and periocular area, initial encounter: Secondary | ICD-10-CM | POA: Insufficient documentation

## 2024-05-08 DIAGNOSIS — F10920 Alcohol use, unspecified with intoxication, uncomplicated: Secondary | ICD-10-CM | POA: Insufficient documentation

## 2024-05-08 DIAGNOSIS — R590 Localized enlarged lymph nodes: Secondary | ICD-10-CM | POA: Insufficient documentation

## 2024-05-08 DIAGNOSIS — Z91120 Patient's intentional underdosing of medication regimen due to financial hardship: Secondary | ICD-10-CM | POA: Diagnosis not present

## 2024-05-08 DIAGNOSIS — Z9181 History of falling: Secondary | ICD-10-CM | POA: Diagnosis present

## 2024-05-08 DIAGNOSIS — Z5982 Transportation insecurity: Secondary | ICD-10-CM | POA: Diagnosis not present

## 2024-05-08 DIAGNOSIS — S0990XA Unspecified injury of head, initial encounter: Secondary | ICD-10-CM | POA: Diagnosis present

## 2024-05-08 DIAGNOSIS — W01198A Fall on same level from slipping, tripping and stumbling with subsequent striking against other object, initial encounter: Secondary | ICD-10-CM | POA: Insufficient documentation

## 2024-05-08 DIAGNOSIS — R45851 Suicidal ideations: Secondary | ICD-10-CM | POA: Diagnosis present

## 2024-05-08 DIAGNOSIS — W1809XA Striking against other object with subsequent fall, initial encounter: Secondary | ICD-10-CM | POA: Insufficient documentation

## 2024-05-08 DIAGNOSIS — S0081XA Abrasion of other part of head, initial encounter: Secondary | ICD-10-CM

## 2024-05-08 DIAGNOSIS — Y908 Blood alcohol level of 240 mg/100 ml or more: Secondary | ICD-10-CM | POA: Insufficient documentation

## 2024-05-08 LAB — URINE DRUG SCREEN, QUALITATIVE (ARMC ONLY)
Amphetamines, Ur Screen: NOT DETECTED
Barbiturates, Ur Screen: NOT DETECTED
Benzodiazepine, Ur Scrn: NOT DETECTED
Cannabinoid 50 Ng, Ur ~~LOC~~: NOT DETECTED
Cocaine Metabolite,Ur ~~LOC~~: NOT DETECTED
MDMA (Ecstasy)Ur Screen: NOT DETECTED
Methadone Scn, Ur: NOT DETECTED
Opiate, Ur Screen: NOT DETECTED
Phencyclidine (PCP) Ur S: NOT DETECTED
Tricyclic, Ur Screen: NOT DETECTED

## 2024-05-08 LAB — COMPREHENSIVE METABOLIC PANEL WITH GFR
ALT: 31 U/L (ref 0–44)
AST: 64 U/L — ABNORMAL HIGH (ref 15–41)
Albumin: 3.6 g/dL (ref 3.5–5.0)
Alkaline Phosphatase: 68 U/L (ref 38–126)
Anion gap: 8 (ref 5–15)
BUN: 13 mg/dL (ref 8–23)
CO2: 25 mmol/L (ref 22–32)
Calcium: 8.1 mg/dL — ABNORMAL LOW (ref 8.9–10.3)
Chloride: 110 mmol/L (ref 98–111)
Creatinine, Ser: 0.98 mg/dL (ref 0.61–1.24)
GFR, Estimated: >60 mL/min (ref >60)
Glucose, Bld: 139 mg/dL — ABNORMAL HIGH (ref 70–99)
Potassium: 3.5 mmol/L (ref 3.5–5.1)
Sodium: 143 mmol/L (ref 135–145)
Total Bilirubin: 1 mg/dL (ref 0.0–1.2)
Total Protein: 6.7 g/dL (ref 6.5–8.1)

## 2024-05-08 LAB — CBC
HCT: 39.2 % (ref 39.0–52.0)
Hemoglobin: 13.3 g/dL (ref 13.0–17.0)
MCH: 34.6 pg — ABNORMAL HIGH (ref 26.0–34.0)
MCHC: 33.9 g/dL (ref 30.0–36.0)
MCV: 102.1 fL — ABNORMAL HIGH (ref 80.0–100.0)
Platelets: 136 K/uL — ABNORMAL LOW (ref 150–400)
RBC: 3.84 MIL/uL — ABNORMAL LOW (ref 4.22–5.81)
RDW: 15.4 % (ref 11.5–15.5)
WBC: 11.5 K/uL — ABNORMAL HIGH (ref 4.0–10.5)
nRBC: 0 % (ref 0.0–0.2)

## 2024-05-08 LAB — ETHANOL: Alcohol, Ethyl (B): 281 mg/dL — ABNORMAL HIGH (ref <15)

## 2024-05-08 MED ORDER — HALOPERIDOL LACTATE 5 MG/ML IJ SOLN
1.0000 mg | Freq: Once | INTRAMUSCULAR | Status: AC
  Administered 2024-05-08: 1 mg via INTRAVENOUS
  Filled 2024-05-08: qty 1
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
## 2024-05-08 NOTE — ED Notes (Signed)
 Pt verbally stopped attempting to go outside by Dr. Rayleen and myself. Pt agreed to return to [facility]
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
## 2024-05-08 NOTE — ED Notes (Signed)
 Pt dressed out :  [employer] plain shirt Blue shorts [employer] Black socks Cigarettes Black belt  Black underwear

## 2024-05-09 MED ORDER — ADULT MULTIVITAMIN W/MINERALS CH
1.0000 | ORAL_TABLET | Freq: Every day | ORAL | Status: DC
  Administered 2024-05-09 (×2): 1 via ORAL
  Filled 2024-05-09: qty 1

## 2024-05-09 MED ORDER — FOLIC ACID 1 MG PO TABS
1.0000 mg | ORAL_TABLET | Freq: Every day | ORAL | Status: DC
  Administered 2024-05-09 (×2): 1 mg via ORAL
  Filled 2024-05-09: qty 1

## 2024-05-09 MED ORDER — LORAZEPAM 1 MG PO TABS: 1.0000 mg | ORAL_TABLET | ORAL | Status: DC | PRN

## 2024-05-09 MED ORDER — THIAMINE HCL 100 MG/ML IJ SOLN
100.0000 mg | Freq: Every day | INTRAMUSCULAR | Status: DC
  Filled 2024-05-09: qty 2

## 2024-05-09 MED ORDER — THIAMINE MONONITRATE 100 MG PO TABS
100.0000 mg | ORAL_TABLET | Freq: Every day | ORAL | Status: DC
  Administered 2024-05-09 (×2): 100 mg via ORAL
  Filled 2024-05-09: qty 1

## 2024-05-09 NOTE — ED Notes (Addendum)
 Pt discharged at this time. RN reviewed discharge instructions with pt. Pt verbalized understanding. Vital signs taken. RR even and unlabored. Pt denies any questions or needs at this time. Pt ambulatory at discharge with all belongings. Pt getting bus pass in lobby at this time.

## 2024-05-09 NOTE — BH Assessment (Signed)
 Per Dorothyann, RN pt is unable to appropriately participate in an assessment at this time due to intoxication. Pt was also given Haldol  medication at 2342. Pt will be seen by TTS at a later time.

## 2024-05-09 NOTE — ED Notes (Signed)
 Meal tray provided. Tray checked for potential hazards. Pt denies no other needs at this time.

## 2024-05-09 NOTE — BH Assessment (Signed)
 IVC PAPERS  RESCINDED  PER  DR  JADAPALLE  MD

## 2024-05-09 NOTE — ED Provider Notes (Signed)
 Involuntary commitment rescinded by Dr. Jadapalle, the psychiatry team advised they have offered patient treatment recommendations, resources etc.  He is not a candidate for inpatient treatment at this time.  They recommend discharge with follow-up with outpatient resources  IVC rescinded by psychiatry.    ----------------------------------------- 10:57 AM on 05/09/2024 -----------------------------------------  Patient alert fully oriented, sits up without distress.  Understanding of plan for discharge.  He is not interested in receiving alcohol treatment resources at this time.  He is not driving, intends to take the bus home.  Fully alert oriented appropriate for discharge  Return precautions and treatment recommendations and follow-up discussed with the patient who is agreeable with the plan.    Dicky Anes, MD 05/09/24 1057

## 2024-05-09 NOTE — BH Assessment (Signed)
 Comprehensive Clinical Assessment (CCA) Screening, Triage and Referral Note  05/09/2024 Randall Harvey [id number]  Randall Harvey., 63 year old male who presents to [facility] ED involuntarily for treatment. Per triage note, Pt BIB EMS from the gas station, etoh on board, small laceration to right eyebrow. Endorses SI.   During TTS assessment pt presents alert and oriented x 4, restless but cooperative, and mood-congruent with affect. The pt does not appear to be responding to internal or external stimuli. Neither is the pt presenting with any delusional thinking. Pt verified the information provided to triage RN.   Pt identifies his main complaint to be that he tripped over the curb and hit his head on the pavement. Patient reports it was not due to increased alcohol consumption. "I only drank a pint." Patient denies depression yet experiencing some anxiety. Patient states he has not been taking his medications because he no longer has transportation to pick them up, in addition, patient says he has been "trying to do without them." Patient appears stable, says he is sleeping and eating well. Patient receives disability in which he reports he plans to get a vehicle next month. Pt denies current SI/HI/AH/VH. Pt contracts for safety. Patient denied alcohol treatment resources.   Per Dr. Jadapalle, pt does not meet criteria for inpatient admission and can be discharged.  Chief Complaint:  Chief Complaint  Patient presents with   Alcohol Intoxication   Visit Diagnosis: Alcohol intoxication  Patient Reported Information How did you hear about us ? -- (EMS)  What Is the Reason for Your Visit/Call Today? Pt BIB EMS from the gas station, etoh on board, small laceration to right eyebrow. Endorses SI.  How Long Has This Been Causing You Problems? > than 6 months  What Do You Feel Would Help You the Most Today? -- (Assessment only)   Have You Recently Had Any Thoughts About Hurting Yourself? No  Are You  Planning to Commit Suicide/Harm Yourself At This time? No   Have you Recently Had Thoughts About Hurting Someone Sherral? No  Are You Planning to Harm Someone at This Time? No  Explanation: Patient denied SI.   Have You Used Any Alcohol or Drugs in the Past 24 Hours? Yes  How Long Ago Did You Use Drugs or Alcohol? Alcohol  What Did You Use and How Much? 1 pint   Do You Currently Have a Therapist/Psychiatrist? No  Name of Therapist/Psychiatrist: No data recorded  Have You Been Recently Discharged From Any Office Practice or Programs? No  Explanation of Discharge From Practice/Program: No data recorded   CCA Screening Triage Referral Assessment Type of Contact: Face-to-Face  Telemedicine Service Delivery:   Is this Initial or Reassessment?   Date Telepsych consult ordered in [facility]:    Time Telepsych consult ordered in [facility]:    Location of Assessment: [facility] ED  Provider Location: [facility] ED    Collateral Involvement: n/a   Does Patient Have a Court Appointed Legal Guardian? No data recorded Name and Contact of Legal Guardian: No data recorded If Minor and Not Living with Parent(s), Who has Custody? n/a  Is CPS involved or ever been involved? Never  Is APS involved or ever been involved? Never   Patient Determined To Be At Risk for Harm To Self or Others Based on Review of Patient Reported Information or Presenting Complaint? No  Method: No Plan  Availability of Means: No access or NA  Intent: Vague intent or NA  Notification Required: No need  or identified person  Additional Information for Danger to Others Potential: -- (n/a)  Additional Comments for Danger to Others Potential: n/a  Are There Guns or Other Weapons in Your Home? No  Types of Guns/Weapons: n/a  Are These Weapons Safely Secured?                            No  Who Could Verify You Are Able To Have These Secured: n/a  Do You Have any Outstanding Charges, Pending Court Dates, Parole/Probation?  None reported  Contacted To Inform of Risk of Harm To Self or Others: -- (n/a)   Does Patient Present under Involuntary Commitment? Yes    [place] of Residence:    Patient Currently Receiving the Following Services: Not Receiving Services   Determination of Need: Emergent (2 hours)   Options For Referral: ED Visit; Chemical Dependency Intensive Outpatient Therapy (CDIOP)   Disposition Recommendation per psychiatric provider: Per Dr. Jadapalle, pt does not meet criteria for inpatient admission and can be discharged.  Brittish Bolinger JONELLE Dolly, Counselor, LCAS-A

## 2024-05-09 NOTE — ED Notes (Signed)
 Pt given water. Pt ambulatory to restroom with even and steady gait.

## 2024-06-15 ENCOUNTER — Emergency Department

## 2024-06-15 ENCOUNTER — Other Ambulatory Visit: Payer: Self-pay

## 2024-06-15 ENCOUNTER — Emergency Department
Admission: EM | Admit: 2024-06-15 | Discharge: 2024-06-15 | Disposition: A | Discharge status: Home / Self Care | Attending: Emergency Medicine | Admitting: Emergency Medicine

## 2024-06-15 DIAGNOSIS — F10229 Alcohol dependence with intoxication, unspecified: Secondary | ICD-10-CM | POA: Diagnosis not present

## 2024-06-15 DIAGNOSIS — F10129 Alcohol abuse with intoxication, unspecified: Secondary | ICD-10-CM | POA: Insufficient documentation

## 2024-06-15 DIAGNOSIS — I10 Essential (primary) hypertension: Secondary | ICD-10-CM | POA: Insufficient documentation

## 2024-06-15 DIAGNOSIS — W19XXXA Unspecified fall, initial encounter: Secondary | ICD-10-CM | POA: Diagnosis not present

## 2024-06-15 DIAGNOSIS — R079 Chest pain, unspecified: Secondary | ICD-10-CM

## 2024-06-15 DIAGNOSIS — Y908 Blood alcohol level of 240 mg/100 ml or more: Secondary | ICD-10-CM | POA: Insufficient documentation

## 2024-06-15 DIAGNOSIS — R0789 Other chest pain: Secondary | ICD-10-CM | POA: Diagnosis present

## 2024-06-15 DIAGNOSIS — F1090 Alcohol use, unspecified, uncomplicated: Secondary | ICD-10-CM

## 2024-06-15 LAB — CBC
HCT: 44.4 % (ref 39.0–52.0)
Hemoglobin: 14.9 g/dL (ref 13.0–17.0)
MCH: 35.4 pg — ABNORMAL HIGH (ref 26.0–34.0)
MCHC: 33.6 g/dL (ref 30.0–36.0)
MCV: 105.5 fL — ABNORMAL HIGH (ref 80.0–100.0)
Platelets: 116 K/uL — ABNORMAL LOW (ref 150–400)
RBC: 4.21 MIL/uL — ABNORMAL LOW (ref 4.22–5.81)
RDW: 12.8 % (ref 11.5–15.5)
WBC: 9.2 K/uL (ref 4.0–10.5)
nRBC: 0 % (ref 0.0–0.2)

## 2024-06-15 LAB — BASIC METABOLIC PANEL WITH GFR
Anion gap: 15 (ref 5–15)
BUN: 9 mg/dL (ref 8–23)
CO2: 25 mmol/L (ref 22–32)
Calcium: 8.6 mg/dL — ABNORMAL LOW (ref 8.9–10.3)
Chloride: 105 mmol/L (ref 98–111)
Creatinine, Ser: 0.87 mg/dL (ref 0.61–1.24)
GFR, Estimated: >60 mL/min (ref >60)
Glucose, Bld: 117 mg/dL — ABNORMAL HIGH (ref 70–99)
Potassium: 3.6 mmol/L (ref 3.5–5.1)
Sodium: 145 mmol/L (ref 135–145)

## 2024-06-15 LAB — TROPONIN I (HIGH SENSITIVITY): Troponin I (High Sensitivity): 7 ng/L (ref <18)

## 2024-06-15 LAB — ETHANOL: Alcohol, Ethyl (B): 288 mg/dL — ABNORMAL HIGH (ref <15)

## 2024-06-15 NOTE — ED Provider Notes (Signed)
 [facility] Provider Note    Event Date/Time   First MD Initiated Contact with Patient 06/15/24 1558     (approximate)  History   Chief Complaint: Chest Pain  HPI  Randall Harvey. is a 63 y.o. male with past medical history of alcohol abuse, anxiety, hypertension, presents to the emergency department for chest pain.  According to the patient he admits alcohol use today EMS was called to the side of the road as the patient had fallen over and seemed to be having trouble due to alcohol intoxication.  Here the patient states he was having some centralized chest discomfort.  Patient denies any shortness of breath.  No diaphoresis.  Patient is ambulatory he is walking around the emergency department currently, saying he needs to stretch his legs.  Physical Exam   Triage Vital Signs: ED Triage Vitals  Encounter Vitals Group     BP 06/15/24 1534 103/67     Girls Systolic BP Percentile --      Girls Diastolic BP Percentile --      Boys Systolic BP Percentile --      Boys Diastolic BP Percentile --      Pulse Rate 06/15/24 1534 82     Resp 06/15/24 1534 17     Temp 06/15/24 1534 98.2 F (36.8 C)     Temp Source 06/15/24 1534 Oral     SpO2 06/15/24 1534 94 %     Weight --      Height --      Head Circumference --      Peak Flow --      Pain Score 06/15/24 1537 5     Pain Loc --      Pain Education --      Exclude from Growth Chart --     Most recent vital signs: Vitals:   06/15/24 1534  BP: 103/67  Pulse: 82  Resp: 17  Temp: 98.2 F (36.8 C)  SpO2: 94%    General: Awake, no distress.  CV:  Good peripheral perfusion.  Regular rate and rhythm  Resp:  Normal effort.  Equal breath sounds bilaterally.  Abd:  No distention.  Soft, nontender.  No rebound or guarding.  ED Results / Procedures / Treatments   EKG  EKG viewed and interpreted by myself shows a normal sinus rhythm 81 bpm with a widened QRS, normal axis, nonspecific ST changes.  No ST  elevation.  RADIOLOGY  I have reviewed interpret the chest x-ray images.  No consolidation on my evaluation. Radiology is read the x-ray is negative for acute abnormality.   MEDICATIONS ORDERED IN ED: Medications - No data to display   IMPRESSION / MDM / ASSESSMENT AND PLAN / ED COURSE  I reviewed the triage vital signs and the nursing notes.  Patient's presentation is most consistent with acute presentation with potential threat to life or bodily function.  Patient presents emergency department with alcohol intoxication also complaining of some chest pain.  States it has been ongoing for a while.  Asking for something to eat and drink.  We will feed the patient we will check labs including a cardiac enzyme.  Will obtain an EKG and a chest x-ray and continue to closely monitor.  Patient's workup has resulted showing a reassuring CBC reassuring chemistry.  Troponin is reassuringly negative EKG and chest x-ray showed no significant finding.  Patient's ethanol level is elevated 288.  Patient however is ambulatory without issue.  He has eaten and drink in a meal.  Patient has apparently eloped from the emergency department.  Patient had a reassuring workup besides an alcohol level elevated.  However patient has chronic alcoholism and is not driving.  FINAL CLINICAL IMPRESSION(S) / ED DIAGNOSES   Chest pain Alcohol intoxication    Note:  This document was prepared using Dragon voice recognition software and may include unintentional dictation errors.   Dorothyann Drivers, MD 06/15/24 1700

## 2024-06-15 NOTE — ED Triage Notes (Signed)
 Pt said he had chest pain while he was sitting down and he fell over. Pt reports drinking alcohol today. Pt says he is dizzy.

## 2024-06-15 NOTE — ED Triage Notes (Signed)
 First nurse note: Pt to ED via [facility] from side of the road. Pt reports centralized CP. Etoh on board. CP 8/10   105/58 94% RA 79 HR

## 2024-06-17 ENCOUNTER — Emergency Department
Admission: EM | Admit: 2024-06-17 | Discharge: 2024-06-18 | Disposition: A | Discharge status: Home / Self Care | Attending: Emergency Medicine | Admitting: Emergency Medicine

## 2024-06-17 ENCOUNTER — Emergency Department

## 2024-06-17 ENCOUNTER — Other Ambulatory Visit: Payer: Self-pay

## 2024-06-17 DIAGNOSIS — R0602 Shortness of breath: Secondary | ICD-10-CM | POA: Insufficient documentation

## 2024-06-17 DIAGNOSIS — R059 Cough, unspecified: Secondary | ICD-10-CM | POA: Diagnosis not present

## 2024-06-17 DIAGNOSIS — D72829 Elevated white blood cell count, unspecified: Secondary | ICD-10-CM | POA: Diagnosis not present

## 2024-06-17 DIAGNOSIS — R451 Restlessness and agitation: Secondary | ICD-10-CM | POA: Diagnosis not present

## 2024-06-17 DIAGNOSIS — R0789 Other chest pain: Secondary | ICD-10-CM | POA: Insufficient documentation

## 2024-06-17 DIAGNOSIS — Y907 Blood alcohol level of 200-239 mg/100 ml: Secondary | ICD-10-CM | POA: Diagnosis not present

## 2024-06-17 DIAGNOSIS — Z8673 Personal history of transient ischemic attack (TIA), and cerebral infarction without residual deficits: Secondary | ICD-10-CM | POA: Diagnosis not present

## 2024-06-17 DIAGNOSIS — F10920 Alcohol use, unspecified with intoxication, uncomplicated: Secondary | ICD-10-CM | POA: Diagnosis not present

## 2024-06-17 DIAGNOSIS — I4510 Unspecified right bundle-branch block: Secondary | ICD-10-CM | POA: Insufficient documentation

## 2024-06-17 DIAGNOSIS — R051 Acute cough: Secondary | ICD-10-CM

## 2024-06-17 DIAGNOSIS — R4781 Slurred speech: Secondary | ICD-10-CM | POA: Diagnosis present

## 2024-06-17 LAB — HEPATIC FUNCTION PANEL
ALT: 38 U/L (ref 0–44)
AST: 56 U/L — ABNORMAL HIGH (ref 15–41)
Albumin: 3.7 g/dL (ref 3.5–5.0)
Alkaline Phosphatase: 67 U/L (ref 38–126)
Bilirubin, Direct: 0.1 mg/dL (ref 0.0–0.2)
Indirect Bilirubin: 0.7 mg/dL (ref 0.3–0.9)
Total Bilirubin: 0.8 mg/dL (ref 0.0–1.2)
Total Protein: 7 g/dL (ref 6.5–8.1)

## 2024-06-17 LAB — BASIC METABOLIC PANEL WITH GFR
Anion gap: 11 (ref 5–15)
BUN: 12 mg/dL (ref 8–23)
CO2: 24 mmol/L (ref 22–32)
Calcium: 8.2 mg/dL — ABNORMAL LOW (ref 8.9–10.3)
Chloride: 107 mmol/L (ref 98–111)
Creatinine, Ser: 0.85 mg/dL (ref 0.61–1.24)
GFR, Estimated: >60 mL/min (ref >60)
Glucose, Bld: 92 mg/dL (ref 70–99)
Potassium: 3.7 mmol/L (ref 3.5–5.1)
Sodium: 142 mmol/L (ref 135–145)

## 2024-06-17 LAB — TROPONIN I (HIGH SENSITIVITY)
Troponin I (High Sensitivity): 8 ng/L (ref <18)
Troponin I (High Sensitivity): 7 ng/L (ref <18)

## 2024-06-17 LAB — CBC
HCT: 42.9 % (ref 39.0–52.0)
Hemoglobin: 14.2 g/dL (ref 13.0–17.0)
MCH: 34.2 pg — ABNORMAL HIGH (ref 26.0–34.0)
MCHC: 33.1 g/dL (ref 30.0–36.0)
MCV: 103.4 fL — ABNORMAL HIGH (ref 80.0–100.0)
Platelets: 122 K/uL — ABNORMAL LOW (ref 150–400)
RBC: 4.15 MIL/uL — ABNORMAL LOW (ref 4.22–5.81)
RDW: 12.5 % (ref 11.5–15.5)
WBC: 12.1 K/uL — ABNORMAL HIGH (ref 4.0–10.5)
nRBC: 0.2 % (ref 0.0–0.2)

## 2024-06-17 LAB — LIPASE, BLOOD: Lipase: 39 U/L (ref 11–51)

## 2024-06-17 MED ORDER — SODIUM CHLORIDE 0.9 % IV BOLUS
1000.0000 mL | Freq: Once | INTRAVENOUS | Status: AC
  Administered 2024-06-17: 1000 mL via INTRAVENOUS

## 2024-06-17 MED ORDER — HALOPERIDOL LACTATE 5 MG/ML IJ SOLN
2.0000 mg | Freq: Once | INTRAMUSCULAR | Status: AC
  Administered 2024-06-17: 2 mg via INTRAVENOUS
  Filled 2024-06-17: qty 1

## 2024-06-17 NOTE — ED Triage Notes (Signed)
 Pt comes with cp and headache. Pt states he is dizzy. Pt stats this started weeks ago. Pt stumbling to walk and slurring with words. Pt states he does drink alcohol. Pt did drink today.

## 2024-06-17 NOTE — ED Provider Notes (Signed)
 [facility] Provider Note    Event Date/Time   First MD Initiated Contact with Patient 06/17/24 2008     (approximate)   History   Chest Pain   HPI  Randall Harvey. is a 63 y.o. male with history of alcohol use who comes in with concerns for chest pain, headache, dizziness.  Patient does report drinking alcohol.  Patient try to get out of the bed stating where is my girlfriend at.  He states that his girlfriend is also here in the emergency room.  We discussed not getting out of bed due to the risk for falling.  He denies any concerns for falls.  He he does report having some intermittent chest pain.     Physical Exam   Triage Vital Signs: ED Triage Vitals  Encounter Vitals Group     BP 06/17/24 1855 110/70     Girls Systolic BP Percentile --      Girls Diastolic BP Percentile --      Boys Systolic BP Percentile --      Boys Diastolic BP Percentile --      Pulse Rate 06/17/24 1855 80     Resp 06/17/24 1855 18     Temp 06/17/24 1855 98 F (36.7 C)     Temp src --      SpO2 06/17/24 1855 96 %     Weight 06/17/24 1847 160 lb (72.6 kg)     Height 06/17/24 1847 5' 7 (1.702 m)     Head Circumference --      Peak Flow --      Pain Score 06/17/24 1845 8     Pain Loc --      Pain Education --      Exclude from Growth Chart --     Most recent vital signs: Vitals:   06/17/24 1855  BP: 110/70  Pulse: 80  Resp: 18  Temp: 98 F (36.7 C)  SpO2: 96%     General: Awake, no distress.  Patient appears intoxicated CV:  Good peripheral perfusion.  Resp:  Normal effort.  Abd:  No distention.  Soft and nontender Other:  Patient is moving all extremities well.     ED Results / Procedures / Treatments   Labs (all labs ordered are listed, but only abnormal results are displayed) Labs Reviewed  BASIC METABOLIC PANEL WITH GFR - Abnormal; Notable for the following components:      Result Value   Calcium 8.2 (*)    All other components within  normal limits  CBC - Abnormal; Notable for the following components:   WBC 12.1 (*)    RBC 4.15 (*)    MCV 103.4 (*)    MCH 34.2 (*)    Platelets 122 (*)    All other components within normal limits  TROPONIN I (HIGH SENSITIVITY)     EKG  My interpretation of EKG:   Normal sinus no stelevation, no T wave inversion, right bundle branch block.  Similar to prior EKG RADIOLOGY I have reviewed the ct personally and interpreted no evidence of intracranial hemorrhage   PROCEDURES:  Critical Care performed: No  Procedures   MEDICATIONS ORDERED IN ED: Medications  sodium chloride  0.9 % bolus 1,000 mL (1,000 mLs Intravenous New Bag/Given 06/17/24 2020)  haloperidol  lactate (HALDOL ) injection 2 mg (2 mg Intravenous Given 06/17/24 2019)     IMPRESSION / MDM / ASSESSMENT AND PLAN / ED COURSE  I reviewed the triage  vital signs and the nursing notes.   Patient's presentation is most consistent with acute presentation with potential threat to life or bodily function.   Patient comes in with chest pain, dizziness but definitely appears to be intoxicated.  He is here with his partner who is also intoxicated and was found on the street laying down.  They both have frequent presentations for intoxication with ethanol levels typically in the 200s.  Will get workup to evaluate for intracranial hemorrhage, cervical fracture given unclear if any falls.  Will get chest x-ray, troponins evaluate for ACS.  Will treat patient with some fluids.  Given he does have some agitation and trying to get out of bed and I am concerned about a fall risk I will give some Haldol  just to help with calming until we can facilitate workup and make sure he has a safe disposition when he is clinically sober.  Chest x-ray was negative CT head shows an old infarct CT cervical's show stable prominent cervical lymph nodes.  I did provide a copy of report for patient so he can get appropriate follow-up  Troponins are  negative x 2.  Hepatic function reassuring lipase normal BMP reassuring CBC shows slightly elevated white count but no Lane no infectious symptoms  Reviewed the chart and patient has been here multiple times for alcohol intoxication.    Patient will be handed off to oncoming team pending sober evaluation and he has a safe ride home.   The patient is on the cardiac monitor to evaluate for evidence of arrhythmia and/or significant heart rate changes.      FINAL CLINICAL IMPRESSION(S) / ED DIAGNOSES   Final diagnoses:  Alcoholic intoxication without complication (HCC)     Rx / DC Orders   ED Discharge Orders     None        Note:  This document was prepared using Dragon voice recognition software and may include unintentional dictation errors.   Ernest Ronal BRAVO, MD 06/18/24 0000

## 2024-06-17 NOTE — Discharge Instructions (Addendum)
 Return to the ER if you develop worsening symptoms or any other concerns.  You need to follow-up for your incidental findings on CT imaging.  I will give you a number for a neurologist given you have had a prior old stroke as well as your primary care doctor due to some enlarged lymph nodes.  Drinking alcohol like this can be dangerous.  Some for you try to cut down.  Do not stop all the sudden though because that can be life-threatening   IMPRESSION: Old right frontal infarct.   Atrophy, chronic microvascular disease.   No acute intracranial abnormality.      Multilevel degenerative changes.    No acute bony abnormality.    Stable prominent cervical lymph nodes. Recommend follow-up  clinically.    Your chest xray showed no pneumonia.  Your COVID, RSV and flu were negative.

## 2024-06-18 ENCOUNTER — Emergency Department

## 2024-06-18 LAB — RESP PANEL BY RT-PCR (RSV, FLU A&B, COVID)  RVPGX2
Influenza A by PCR: NEGATIVE
Influenza B by PCR: NEGATIVE
Resp Syncytial Virus by PCR: NEGATIVE
SARS Coronavirus 2 by RT PCR: NEGATIVE

## 2024-06-18 LAB — ETHANOL: Alcohol, Ethyl (B): 223 mg/dL — ABNORMAL HIGH (ref <15)

## 2024-06-18 MED ORDER — DEXTROMETHORPHAN POLISTIREX ER 30 MG/5ML PO SUER
30.0000 mg | Freq: Once | ORAL | Status: AC
  Administered 2024-06-18: 30 mg via ORAL
  Filled 2024-06-18: qty 5

## 2024-06-18 MED ORDER — THIAMINE HCL 100 MG/ML IJ SOLN
100.0000 mg | Freq: Once | INTRAMUSCULAR | Status: AC
  Administered 2024-06-18: 100 mg via INTRAVENOUS
  Filled 2024-06-18: qty 2

## 2024-06-18 NOTE — ED Notes (Signed)
 Ward, MD ambulated pt and deems it safe for pt to return home due to his steady gate upon

## 2024-06-18 NOTE — ED Notes (Signed)
 Pt ambulated with EDT Danajai who reported an extremely unsteady gait

## 2024-06-18 NOTE — ED Notes (Signed)
 This RN went over d/c instructions and provided pt with Bus pas. Pt requested to see spouse that's in CPOD. Spouse verefied it was okay. Pt brought to room 37 for a visit.

## 2024-06-18 NOTE — ED Notes (Signed)
 This RN received report from night shift RN Dorothyann

## 2024-06-18 NOTE — ED Provider Notes (Signed)
 12:20 AM  Assumed care at shift change.  Patient here for alcohol intoxication, agitation requiring Haldol .  Now sleeping.  Will need to be reassessed once clinically sober.  3:00 AM  Pt more awake and alert.  Now coughing and complaining of being short of breath.  Lungs clear to auscultation on reassessment.  No hypoxia.  Repeat chest x-ray obtained and reviewed/interpreted by myself and the radiologist and shows no acute abnormality.  COVID, flu and RSV swabs pending.  Given Delsym  and patient is now resting comfortably again.  4:34 AM  COVID, flu, RSV negative.  7:29 AM  I was able to get patient up without difficulty.  He reports chronic vertigo ongoing for months that is unchanged.  He was able to stand on his own and ambulate.  He does have an antalgic gait from a chronic knee injury but states he feels steady walking.  He will take a bus home.  He has no acute complaints.  No signs of alcohol withdrawal.  States he does not drink every day.   At this time, I do not feel there is any life-threatening condition present. I reviewed all nursing notes, vitals, pertinent previous records.  All lab and urine results, EKGs, imaging ordered have been independently reviewed and interpreted by myself.  I reviewed all available radiology reports from any imaging ordered this visit.  Based on my assessment, I feel the patient is safe to be discharged home without further emergent workup and can continue workup as an outpatient as needed. Discussed all findings, treatment plan as well as usual and customary return precautions.  They verbalize understanding and are comfortable with this plan.  Outpatient follow-up has been provided as needed.  All questions have been answered.    Rush Salce, Josette SAILOR, DO 06/18/24 0730

## 2024-06-21 ENCOUNTER — Encounter: Payer: Self-pay | Admitting: *Deleted

## 2024-06-21 ENCOUNTER — Emergency Department
Admission: EM | Admit: 2024-06-21 | Discharge: 2024-06-21 | Disposition: A | Discharge status: Home / Self Care | Attending: Emergency Medicine | Admitting: Emergency Medicine

## 2024-06-21 ENCOUNTER — Other Ambulatory Visit: Payer: Self-pay

## 2024-06-21 DIAGNOSIS — F10920 Alcohol use, unspecified with intoxication, uncomplicated: Secondary | ICD-10-CM

## 2024-06-21 DIAGNOSIS — F1090 Alcohol use, unspecified, uncomplicated: Secondary | ICD-10-CM | POA: Diagnosis present

## 2024-06-21 DIAGNOSIS — F10120 Alcohol abuse with intoxication, uncomplicated: Secondary | ICD-10-CM | POA: Diagnosis not present

## 2024-06-21 DIAGNOSIS — Y908 Blood alcohol level of 240 mg/100 ml or more: Secondary | ICD-10-CM | POA: Insufficient documentation

## 2024-06-21 LAB — BASIC METABOLIC PANEL WITH GFR
Anion gap: 12 (ref 5–15)
BUN: 10 mg/dL (ref 8–23)
CO2: 20 mmol/L — ABNORMAL LOW (ref 22–32)
Calcium: 8.1 mg/dL — ABNORMAL LOW (ref 8.9–10.3)
Chloride: 105 mmol/L (ref 98–111)
Creatinine, Ser: 1.16 mg/dL (ref 0.61–1.24)
GFR, Estimated: >60 mL/min (ref >60)
Glucose, Bld: 115 mg/dL — ABNORMAL HIGH (ref 70–99)
Potassium: 3.1 mmol/L — ABNORMAL LOW (ref 3.5–5.1)
Sodium: 137 mmol/L (ref 135–145)

## 2024-06-21 LAB — CBC
HCT: 41.6 % (ref 39.0–52.0)
Hemoglobin: 14 g/dL (ref 13.0–17.0)
MCH: 35.3 pg — ABNORMAL HIGH (ref 26.0–34.0)
MCHC: 33.7 g/dL (ref 30.0–36.0)
MCV: 104.8 fL — ABNORMAL HIGH (ref 80.0–100.0)
Platelets: 111 K/uL — ABNORMAL LOW (ref 150–400)
RBC: 3.97 MIL/uL — ABNORMAL LOW (ref 4.22–5.81)
RDW: 12.3 % (ref 11.5–15.5)
WBC: 11.2 K/uL — ABNORMAL HIGH (ref 4.0–10.5)
nRBC: 0 % (ref 0.0–0.2)

## 2024-06-21 LAB — ETHANOL: Alcohol, Ethyl (B): 299 mg/dL — ABNORMAL HIGH (ref <15)

## 2024-06-21 LAB — TROPONIN I (HIGH SENSITIVITY): Troponin I (High Sensitivity): 6 ng/L (ref <18)

## 2024-06-21 MED ORDER — POTASSIUM CHLORIDE CRYS ER 20 MEQ PO TBCR
40.0000 meq | EXTENDED_RELEASE_TABLET | Freq: Once | ORAL | Status: AC
  Administered 2024-06-21: 40 meq via ORAL
  Filled 2024-06-21: qty 2

## 2024-06-21 NOTE — ED Notes (Signed)
 Pt eating dinner meal.   Pt on phone with sister for car ride home

## 2024-06-21 NOTE — ED Notes (Signed)
 Crackers and ale given to pt.

## 2024-06-21 NOTE — ED Notes (Signed)
 Pt walked outside smoking a cigarette.  Md aware.  Security with pt.

## 2024-06-21 NOTE — ED Provider Notes (Signed)
 [facility] Provider Note    Event Date/Time   First MD Initiated Contact with Patient 06/21/24 1930     (approximate)   History   Alcohol Intoxication   HPI  Randall Camino. is a 63 y.o. male who presents to the emergency department today intoxicated with concern for possible heart stopping.  Patient states that he was in his normal state of health today.  Had been doing his normal activities when he felt like his heart stopped beating.  He did thump himself in the chest which he says worked.  The time my exam he states he feels better.     Physical Exam   Triage Vital Signs: ED Triage Vitals  Encounter Vitals Group     BP 06/21/24 1943 128/77     Girls Systolic BP Percentile --      Girls Diastolic BP Percentile --      Boys Systolic BP Percentile --      Boys Diastolic BP Percentile --      Pulse Rate 06/21/24 1943 88     Resp 06/21/24 1943 16     Temp 06/21/24 1943 (!) 97.1 F (36.2 C)     Temp Source 06/21/24 1943 Oral     SpO2 06/21/24 1943 97 %     Weight 06/21/24 1940 160 lb (72.6 kg)     Height 06/21/24 1940 5' 7 (1.702 m)     Head Circumference --      Peak Flow --      Pain Score 06/21/24 1940 0     Pain Loc --      Pain Education --      Exclude from Growth Chart --     Most recent vital signs: Vitals:   06/21/24 1943  BP: 128/77  Pulse: 88  Resp: 16  Temp: (!) 97.1 F (36.2 C)  SpO2: 97%   General: Awake, alert, intoxicated CV:  Good peripheral perfusion. Regular rate and rhythm. Resp:  Normal effort. Lungs clear. Abd:  No distention.    ED Results / Procedures / Treatments   Labs (all labs ordered are listed, but only abnormal results are displayed) Labs Reviewed  BASIC METABOLIC PANEL WITH GFR - Abnormal; Notable for the following components:      Result Value   Potassium 3.1 (*)    CO2 20 (*)    Glucose, Bld 115 (*)    Calcium 8.1 (*)    All other components within normal limits  CBC - Abnormal;  Notable for the following components:   WBC 11.2 (*)    RBC 3.97 (*)    MCV 104.8 (*)    MCH 35.3 (*)    Platelets 111 (*)    All other components within normal limits  ETHANOL - Abnormal; Notable for the following components:   Alcohol, Ethyl (B) 299 (*)    All other components within normal limits  URINE DRUG SCREEN, QUALITATIVE ([facility] ONLY)  TROPONIN I (HIGH SENSITIVITY)     EKG  I, Guadalupe Eagles, attending physician, personally viewed and interpreted this EKG  EKG Time: 1947 Rate: 83 Rhythm: normal sinus rhythm Axis: normal Intervals: qtc 462 QRS: RBBB ST changes: no st elevation Impression: abnormal ekg  RADIOLOGY None   PROCEDURES:  Critical Care performed: No   MEDICATIONS ORDERED IN ED: Medications  potassium chloride  SA (KLOR-CON  M) CR tablet 40 mEq (has no administration in time range)     IMPRESSION / MDM /  ASSESSMENT AND PLAN / ED COURSE  I reviewed the triage vital signs and the nursing notes.                              Differential diagnosis includes, but is not limited to, alcohol intoxication, ACS, arrhythmia  Patient's presentation is most consistent with acute presentation with potential threat to life or bodily function.   Patient presented to the emergency department today intoxicated with concern that his heart had stopped beating.  Patient's EKG here without concerning arrhythmia.  Troponin was negative.  Patient does have significantly elevated ethanol.  He is feeling better at the time my exam.  At this time I have low concern for significant arrhythmia.  Patient can be picked up by family member.      FINAL CLINICAL IMPRESSION(S) / ED DIAGNOSES   Final diagnoses:  Alcoholic intoxication without complication     Note:  This document was prepared using Dragon voice recognition software and may include unintentional dictation errors.    Floy Roberts, MD 06/21/24 2059

## 2024-06-21 NOTE — ED Triage Notes (Signed)
 Pt brought in via ems from a sidewalk in front of convenience store.  Pt reports his heart stopped and had to do a thump to get it start beating again.  Pt reports chest pain and drinking alcohol today.  No sob.  Pt alert, in hallway bed.

## 2024-06-21 NOTE — ED Notes (Signed)
 Pt is intoxicated and will not leave equipment on him.  Pt pulled stickers off.  Pt sitting up on side of bed.

## 2024-06-21 NOTE — ED Notes (Signed)
 Patient provided with a [place] sandwich tray, and currently sitting up in bed eating.

## 2024-06-28 ENCOUNTER — Telehealth: Payer: Self-pay

## 2024-06-28 DIAGNOSIS — F10920 Alcohol use, unspecified with intoxication, uncomplicated: Secondary | ICD-10-CM

## 2024-06-28 NOTE — Progress Notes (Signed)
 Complex Care Management Note Care Guide Note  06/28/2024 Name: Randall Harvey. MRN: [id number] DOB: 01/28/1961   Complex Care Management Outreach Attempts: An unsuccessful telephone outreach was attempted today to offer the patient information about available complex care management services.  Follow Up Plan:  Additional outreach attempts will be made to offer the patient complex care management information and services.   Encounter Outcome:  Patient Request to Call Back  Dreama [facility]  [facility], [facility] VBCI Assistant Direct Dial: [phone]  Fax: [phone]

## 2024-07-01 NOTE — Progress Notes (Signed)
 Complex Care Management Note Care Guide Note  07/01/2024 Name: Randall Harvey. MRN: [id number] DOB: 05-11-1961   Complex Care Management Outreach Attempts: A second unsuccessful outreach was attempted today to offer the patient with information about available complex care management services.  Follow Up Plan:  Additional outreach attempts will be made to offer the patient complex care management information and services.   Encounter Outcome:  No Answer  Dreama [facility]  [facility], [facility] VBCI Assistant Direct Dial: [phone]  Fax: [phone]

## 2024-07-04 NOTE — Progress Notes (Signed)
 Complex Care Management Note Care Guide Note  07/04/2024 Name: Randall Harvey. MRN: [id number] DOB: November 02, 1960   Complex Care Management Outreach Attempts: A third unsuccessful outreach was attempted today to offer the patient with information about available complex care management services.  Follow Up Plan:  No further outreach attempts will be made at this time. We have been unable to contact the patient to offer or enroll patient in complex care management services.  Encounter Outcome:  No Answer  Dreama [facility]  [facility], [facility] VBCI Assistant Direct Dial: [phone]  Fax: [phone]

## 2024-07-06 ENCOUNTER — Emergency Department

## 2024-07-06 ENCOUNTER — Emergency Department
Admission: EM | Admit: 2024-07-06 | Discharge: 2024-07-06 | Disposition: A | Discharge status: Home / Self Care | Attending: Emergency Medicine | Admitting: Emergency Medicine

## 2024-07-06 ENCOUNTER — Other Ambulatory Visit: Payer: Self-pay

## 2024-07-06 DIAGNOSIS — S0990XA Unspecified injury of head, initial encounter: Secondary | ICD-10-CM | POA: Insufficient documentation

## 2024-07-06 DIAGNOSIS — Y909 Presence of alcohol in blood, level not specified: Secondary | ICD-10-CM | POA: Diagnosis not present

## 2024-07-06 DIAGNOSIS — W01198A Fall on same level from slipping, tripping and stumbling with subsequent striking against other object, initial encounter: Secondary | ICD-10-CM | POA: Diagnosis not present

## 2024-07-06 DIAGNOSIS — F10129 Alcohol abuse with intoxication, unspecified: Secondary | ICD-10-CM | POA: Insufficient documentation

## 2024-07-06 DIAGNOSIS — F10920 Alcohol use, unspecified with intoxication, uncomplicated: Secondary | ICD-10-CM

## 2024-07-06 NOTE — ED Triage Notes (Signed)
 Here via EMS. Found supine on side of road. Non-combative, easily irritated. C/O of dizziness.

## 2024-07-06 NOTE — ED Provider Notes (Signed)
 [facility] Provider Note    Event Date/Time   First MD Initiated Contact with Patient 07/06/24 0023     (approximate)   History   Alcohol Intoxication   HPI  Randall Harvey. is a 63 y.o. male who comes in the emergency department today after being found down.  Patient does not appear to be intoxicated.  He does state that he fell few days ago and hit his head.  He denies any acute complaints today.  Says he is not sure why they brought him in.     Physical Exam   Triage Vital Signs: ED Triage Vitals  Encounter Vitals Group     BP 07/06/24 0022 128/80     Girls Systolic BP Percentile --      Girls Diastolic BP Percentile --      Boys Systolic BP Percentile --      Boys Diastolic BP Percentile --      Pulse Rate 07/06/24 0022 80     Resp 07/06/24 0022 12     Temp 07/06/24 0022 98.3 F (36.8 C)     Temp Source 07/06/24 0022 Oral     SpO2 07/06/24 0022 97 %     Weight 07/06/24 0024 163 lb (73.9 kg)     Height 07/06/24 0024 5' 7 (1.702 m)     Head Circumference --      Peak Flow --      Pain Score 07/06/24 0020 5     Pain Loc --      Pain Education --      Exclude from Growth Chart --     Most recent vital signs: Vitals:   07/06/24 0030 07/06/24 0149  BP: 126/80 132/84  Pulse: 77 76  Resp: 16 (!) 25  Temp:    SpO2: 97% 96%   General: Awake, alert, not completely oriented. CV:  Good peripheral perfusion.  Resp:  Normal effort.  Abd:  No distention.    ED Results / Procedures / Treatments   Labs (all labs ordered are listed, but only abnormal results are displayed) Labs Reviewed - No data to display   EKG  None   RADIOLOGY I independently interpreted and visualized the CT head/cervical spine. My interpretation: No ICH/ no fracture Radiology interpretation:  IMPRESSION:  CT of the head: Chronic changes without acute abnormality.    CT of the cervical spine: Multilevel degenerative change without  acute abnormality.      PROCEDURES:  Critical Care performed: No   MEDICATIONS ORDERED IN ED: Medications - No data to display   IMPRESSION / MDM / ASSESSMENT AND PLAN / ED COURSE  I reviewed the triage vital signs and the nursing notes.                              Differential diagnosis includes, but is not limited to, alcohol intoxication, ICH  Patient's presentation is most consistent with acute presentation with potential threat to life or bodily function.  Patient presented to the emergency department today after being found down.  Patient does appear intoxicated here.  Did obtain a CT scan of his head and neck which did not show any acute findings.  At this time do think would be safe for patient be discharged to sober up at home.      FINAL CLINICAL IMPRESSION(S) / ED DIAGNOSES   Final diagnoses:  Alcoholic intoxication without complication  Note:  This document was prepared using Dragon voice recognition software and may include unintentional dictation errors.    Floy Roberts, MD 07/06/24 [phone]

## 2024-09-17 ENCOUNTER — Emergency Department
Admission: EM | Admit: 2024-09-17 | Discharge: 2024-09-18 | Disposition: A | Attending: Emergency Medicine | Admitting: Emergency Medicine

## 2024-09-17 ENCOUNTER — Encounter: Payer: Self-pay | Admitting: Emergency Medicine

## 2024-09-17 DIAGNOSIS — E119 Type 2 diabetes mellitus without complications: Secondary | ICD-10-CM | POA: Insufficient documentation

## 2024-09-17 DIAGNOSIS — R058 Other specified cough: Secondary | ICD-10-CM | POA: Diagnosis not present

## 2024-09-17 DIAGNOSIS — R0789 Other chest pain: Secondary | ICD-10-CM | POA: Diagnosis present

## 2024-09-17 DIAGNOSIS — F10920 Alcohol use, unspecified with intoxication, uncomplicated: Secondary | ICD-10-CM | POA: Diagnosis not present

## 2024-09-17 DIAGNOSIS — Z86718 Personal history of other venous thrombosis and embolism: Secondary | ICD-10-CM | POA: Insufficient documentation

## 2024-09-17 DIAGNOSIS — Z86711 Personal history of pulmonary embolism: Secondary | ICD-10-CM | POA: Diagnosis not present

## 2024-09-17 DIAGNOSIS — J40 Bronchitis, not specified as acute or chronic: Secondary | ICD-10-CM | POA: Diagnosis not present

## 2024-09-17 DIAGNOSIS — J449 Chronic obstructive pulmonary disease, unspecified: Secondary | ICD-10-CM | POA: Diagnosis not present

## 2024-09-17 DIAGNOSIS — I4510 Unspecified right bundle-branch block: Secondary | ICD-10-CM | POA: Diagnosis not present

## 2024-09-17 NOTE — ED Triage Notes (Signed)
 Pt BIB [facility] for ETOH Detox. Pt reports drinking a bottle of wine today and desire to get help. Pt endorses some SI thoughts with EMS, none at this time. Pt endorsing some CP and ongoing bilateral knee pain. Pt well appearing. ABCs intact.   Past Medical History:  Diagnosis Date   Alcohol intoxication    Anticoagulant long-term use    lifetime use, coumadin therapy, then Xarelto    Anxiety    Arthritis    Cancer (HCC)    renal   Carotid atherosclerosis    <50% left and right   Clotting disorder    COPD (chronic obstructive pulmonary disease) (HCC)    Depression    Elevated liver enzymes 11/2013   Family history of cancer    Fatty liver    H/O drug abuse (HCC)    H/O ETOH abuse    Hepatitis    History of traumatic brain injury    Hypertension    Lymphadenopathy    Obesity    Personal history of DVT (deep vein thrombosis) 06/2013   Pre-diabetes    Primary osteoarthritis of left knee    Pulmonary embolism and infarction (HCC) 06/2013   Pulmonary embolism and infarction (HCC) 11/2013   Pulmonary embolus with infarction [facility])    Renal cell carcinoma (HCC)    Renal mass, left 06/2013   with ureteral obstruction   Stroke (HCC)    Subarachnoid hemorrhage following injury (HCC)    Thrombocythemia    Thrombocytopenia    Tobacco abuse

## 2024-09-18 ENCOUNTER — Emergency Department

## 2024-09-18 LAB — RESP PANEL BY RT-PCR (RSV, FLU A&B, COVID)  RVPGX2
Influenza A by PCR: NEGATIVE
Influenza B by PCR: NEGATIVE
Resp Syncytial Virus by PCR: NEGATIVE
SARS Coronavirus 2 by RT PCR: NEGATIVE

## 2024-09-18 LAB — TROPONIN T, HIGH SENSITIVITY: Troponin T High Sensitivity: <15 ng/L (ref 0–19)

## 2024-09-18 LAB — COMPREHENSIVE METABOLIC PANEL WITH GFR
ALT: 42 U/L (ref 0–44)
AST: 66 U/L — ABNORMAL HIGH (ref 15–41)
Albumin: 4.8 g/dL (ref 3.5–5.0)
Alkaline Phosphatase: 111 U/L (ref 38–126)
Anion gap: 17 — ABNORMAL HIGH (ref 5–15)
BUN: 8 mg/dL (ref 8–23)
CO2: 28 mmol/L (ref 22–32)
Calcium: 9.5 mg/dL (ref 8.9–10.3)
Chloride: 103 mmol/L (ref 98–111)
Creatinine, Ser: 0.87 mg/dL (ref 0.61–1.24)
GFR, Estimated: >60 mL/min
Glucose, Bld: 127 mg/dL — ABNORMAL HIGH (ref 70–99)
Potassium: 3.3 mmol/L — ABNORMAL LOW (ref 3.5–5.1)
Sodium: 148 mmol/L — ABNORMAL HIGH (ref 135–145)
Total Bilirubin: 1.1 mg/dL (ref 0.0–1.2)
Total Protein: 8.1 g/dL (ref 6.5–8.1)

## 2024-09-18 LAB — CBC WITH DIFFERENTIAL/PLATELET
Abs Immature Granulocytes: 0.02 K/uL (ref 0.00–0.07)
Basophils Absolute: 0 K/uL (ref 0.0–0.1)
Basophils Relative: 0 %
Eosinophils Absolute: 0.1 K/uL (ref 0.0–0.5)
Eosinophils Relative: 1 %
HCT: 49.3 % (ref 39.0–52.0)
Hemoglobin: 16.4 g/dL (ref 13.0–17.0)
Immature Granulocytes: 0 %
Lymphocytes Relative: 48 %
Lymphs Abs: 5 K/uL — ABNORMAL HIGH (ref 0.7–4.0)
MCH: 33.3 pg (ref 26.0–34.0)
MCHC: 33.3 g/dL (ref 30.0–36.0)
MCV: 100 fL (ref 80.0–100.0)
Monocytes Absolute: 0.5 K/uL (ref 0.1–1.0)
Monocytes Relative: 5 %
Neutro Abs: 4.7 K/uL (ref 1.7–7.7)
Neutrophils Relative %: 46 %
Platelets: 127 K/uL — ABNORMAL LOW (ref 150–400)
RBC: 4.93 MIL/uL (ref 4.22–5.81)
RDW: 12 % (ref 11.5–15.5)
Smear Review: NORMAL
WBC: 10.3 K/uL (ref 4.0–10.5)
nRBC: 0 % (ref 0.0–0.2)

## 2024-09-18 LAB — MAGNESIUM: Magnesium: 2.3 mg/dL (ref 1.7–2.4)

## 2024-09-18 MED ORDER — POTASSIUM CHLORIDE CRYS ER 20 MEQ PO TBCR
40.0000 meq | EXTENDED_RELEASE_TABLET | Freq: Once | ORAL | Status: AC
  Administered 2024-09-18: 40 meq via ORAL
  Filled 2024-09-18: qty 2

## 2024-09-18 NOTE — ED Notes (Signed)
 Pt discharged to ED waiting room at this time and left with all belongings. Pt waiting for buses to start running. Pt ABCs intact. RR even and unlabored. Pt in NAD. Pt denies further needs from this RN.

## 2024-09-18 NOTE — ED Notes (Signed)
 Pt provided with ginger ale. Pt ABCs intact. RR even and unlabored. Pt in NAD. Bed in lowest locked position.

## 2024-09-18 NOTE — ED Notes (Signed)
 Pt back from X-ray.

## 2024-09-18 NOTE — ED Provider Notes (Signed)
 "  [facility] Provider Note    Event Date/Time   First MD Initiated Contact with Patient 09/17/24 2341     (approximate)   History   Chief Complaint Alcohol Intoxication and Chest Pain   HPI  Randall Harvey. is a 63 y.o. male with past medical history of diabetes, COPD, DVT/PE, alcohol abuse, and polysubstance abuse who presents to the ED complaining of chest pain.  Patient reports that he has been dealing with sharp pain in the left side of his chest throughout the day today associated with a cough productive of yellowish sputum as well as some difficulty breathing.  He denies any fevers, nausea, or vomiting but does endorse recent diarrhea.  He states his girlfriend has been ill with similar symptoms.  He also admits to consuming a pint of liquor today, currently denies any thoughts of harming himself or others.     Physical Exam   Triage Vital Signs: ED Triage Vitals  Encounter Vitals Group     BP 09/17/24 2329 (!) 156/89     Girls Systolic BP Percentile --      Girls Diastolic BP Percentile --      Boys Systolic BP Percentile --      Boys Diastolic BP Percentile --      Pulse Rate 09/17/24 2329 76     Resp 09/17/24 2330 19     Temp 09/17/24 2330 98.7 F (37.1 C)     Temp Source 09/17/24 2330 Oral     SpO2 09/17/24 2330 97 %     Weight 09/17/24 2330 160 lb (72.6 kg)     Height 09/17/24 2330 5' 7 (1.702 m)     Head Circumference --      Peak Flow --      Pain Score 09/17/24 2336 5     Pain Loc --      Pain Education --      Exclude from Growth Chart --     Most recent vital signs: Vitals:   09/17/24 2329 09/17/24 2330  BP: (!) 156/89 (!) 156/89  Pulse: 76 76  Resp:  19  Temp:  98.7 F (37.1 C)  SpO2:  97%    Constitutional: Alert and oriented. Eyes: Conjunctivae are normal. Head: Atraumatic. Nose: No congestion/rhinnorhea. Mouth/Throat: Mucous membranes are moist.  Cardiovascular: Normal rate, regular rhythm. Grossly normal  heart sounds.  2+ radial pulses bilaterally. Respiratory: Normal respiratory effort.  No retractions. Lungs CTAB. Gastrointestinal: Soft and nontender. No distention. Musculoskeletal: No lower extremity tenderness nor edema.  Neurologic:  Normal speech and language. No gross focal neurologic deficits are appreciated.    ED Results / Procedures / Treatments   Labs (all labs ordered are listed, but only abnormal results are displayed) Labs Reviewed  CBC WITH DIFFERENTIAL/PLATELET - Abnormal; Notable for the following components:      Result Value   Platelets 127 (*)    All other components within normal limits  COMPREHENSIVE METABOLIC PANEL WITH GFR - Abnormal; Notable for the following components:   Sodium 148 (*)    Potassium 3.3 (*)    Glucose, Bld 127 (*)    AST 66 (*)    Anion gap 17 (*)    All other components within normal limits  RESP PANEL BY RT-PCR (RSV, FLU A&B, COVID)  RVPGX2  MAGNESIUM   TROPONIN T, HIGH SENSITIVITY     EKG  ED ECG REPORT I, Carlin Palin, the attending physician, personally viewed and interpreted  this ECG.   Date: 09/18/2024  EKG Time: 1:43  Rate: 92  Rhythm: normal sinus rhythm  Axis: Normal  Intervals:right bundle branch block  ST&T Change: None  RADIOLOGY Chest x-ray reviewed and interpreted by me with no infiltrate, edema, or effusion.  PROCEDURES:  Critical Care performed: No  Procedures   MEDICATIONS ORDERED IN ED: Medications  potassium chloride  SA (KLOR-CON  M) CR tablet 40 mEq (40 mEq Oral Given 09/18/24 0233)     IMPRESSION / MDM / ASSESSMENT AND PLAN / ED COURSE  I reviewed the triage vital signs and the nursing notes.                              63 y.o. male with past medical history of diabetes, COPD, DVT/PE, alcohol abuse, and polysubstance use who presents to the ED with 24 hours of cough, congestion, chest pain, and shortness of breath.  Patient's presentation is most consistent with acute presentation with  potential threat to life or bodily function.  Differential diagnosis includes, but is not limited to, ACS, PE, pneumonia, pneumothorax, musculoskeletal pain, GERD, anxiety, anemia, electrolyte abnormality, AKI, alcohol intoxication.  Patient nontoxic-appearing and in no acute distress, vital signs are unremarkable.  Patient initially presented requesting help with alcohol abuse, but is now stating that he is here for his chest discomfort and difficulty breathing.  Symptoms seem most likely to be infectious in etiology, chest x-ray and viral panel are pending.  He currently denies any suicidal or homicidal ideation, no indication for IVC.  Chest x-ray is unremarkable, troponin within normal limits.  COVID and flu testing is negative, suspect alternative viral etiology as the cause of his chest discomfort and productive cough.  Low suspicion for PE at this time and remainder of labs are reassuring without significant anemia, leukocytosis, electrolyte abnormality, or AKI.  Patient appropriate for discharge home with outpatient follow-up, was counseled to return to the ED for new or worsening symptoms.  He appears to be clinically sober and is ambulating with a steady gait.  Patient agrees with plan.      FINAL CLINICAL IMPRESSION(S) / ED DIAGNOSES   Final diagnoses:  Atypical chest pain  Bronchitis  Alcoholic intoxication without complication     Rx / DC Orders   ED Discharge Orders     None        Note:  This document was prepared using Dragon voice recognition software and may include unintentional dictation errors.   Willo Dunnings, MD 09/18/24 0236  "

## 2024-09-18 NOTE — ED Notes (Addendum)
 Pt finished eating snack. No signs of withdrawal from pt. Pt well appearing, Pt ABCs intact. RR even and unlabored. Pt in NAD. Bed in lowest locked position.

## 2024-09-23 ENCOUNTER — Encounter: Payer: Self-pay | Admitting: Emergency Medicine

## 2024-09-23 ENCOUNTER — Emergency Department
Admission: EM | Admit: 2024-09-23 | Discharge: 2024-09-24 | Disposition: A | Discharge status: Home / Self Care | Attending: Emergency Medicine | Admitting: Emergency Medicine

## 2024-09-23 ENCOUNTER — Emergency Department

## 2024-09-23 ENCOUNTER — Other Ambulatory Visit: Payer: Self-pay

## 2024-09-23 DIAGNOSIS — S0990XA Unspecified injury of head, initial encounter: Secondary | ICD-10-CM | POA: Insufficient documentation

## 2024-09-23 DIAGNOSIS — M25562 Pain in left knee: Secondary | ICD-10-CM | POA: Diagnosis not present

## 2024-09-23 DIAGNOSIS — R590 Localized enlarged lymph nodes: Secondary | ICD-10-CM | POA: Insufficient documentation

## 2024-09-23 DIAGNOSIS — R599 Enlarged lymph nodes, unspecified: Secondary | ICD-10-CM

## 2024-09-23 DIAGNOSIS — W19XXXA Unspecified fall, initial encounter: Secondary | ICD-10-CM | POA: Insufficient documentation

## 2024-09-23 NOTE — ED Triage Notes (Signed)
 Patient to [facility] via EMS.  Per EMS patient reported that he had crossed the street and was stepping up onto curb when he fell.  Patient complaining of left knee pain.  Patient also admitting to having 2 pints of alcohol.  Patient with noted areas over left lower leg that appears as possible bug bites.

## 2024-09-23 NOTE — ED Provider Notes (Signed)
 "  [facility] Provider Note    Event Date/Time   First MD Initiated Contact with Patient 09/23/24 2122     (approximate)   History   Fall and Alcohol Intoxication   HPI  Randall Harvey. is a 63 y.o. male who presents to the emergency department today with primary complaint of left knee pain.  Patient states he was trying to get up onto a curb when he felt a pop in his knee and he fell.  He then fell onto his left knee.  He states he has history of arthritis of in that knee.  Has had pain in that knee before.  Also states that he hit his head when he fell.  Patient admits to drinking alcohol tonight.     Physical Exam   Triage Vital Signs: ED Triage Vitals  Encounter Vitals Group     BP 09/23/24 2133 (!) 147/77     Girls Systolic BP Percentile --      Girls Diastolic BP Percentile --      Boys Systolic BP Percentile --      Boys Diastolic BP Percentile --      Pulse Rate 09/23/24 2133 81     Resp 09/23/24 2133 17     Temp 09/23/24 2133 97.7 F (36.5 C)     Temp Source 09/23/24 2133 Oral     SpO2 09/23/24 2133 93 %     Weight 09/23/24 2133 162 lb (73.5 kg)     Height 09/23/24 2133 5' 7 (1.702 m)     Head Circumference --      Peak Flow --      Pain Score 09/23/24 2132 9     Pain Loc --      Pain Education --      Exclude from Growth Chart --     Most recent vital signs: Vitals:   09/23/24 2133  BP: (!) 147/77  Pulse: 81  Resp: 17  Temp: 97.7 F (36.5 C)  SpO2: 93%   General: Awake, alert, oriented. CV:  Good peripheral perfusion. Regular rate and rhythm. Resp:  Normal effort. Lungs clear. Abd:  No distention.  Ext:  Left knee with no deformity or skin break. Tender to palpation on exam.    ED Results / Procedures / Treatments   Labs (all labs ordered are listed, but only abnormal results are displayed) Labs Reviewed - No data to display   EKG  None   RADIOLOGY I independently interpreted and visualized the left knee. My  interpretation: No fracture Radiology interpretation:  IMPRESSION:  1. No acute fracture or dislocation.  2. Small suprapatellar bursal effusion.  3. Osteoarthritis, and At least 2 small loose bodies in the posterior joint  space.    I independently interpreted and visualized the CT head. My interpretation: No ICH Radiology interpretation:  IMPRESSION:  1. No acute intracranial abnormality.   I independently interpreted and visualized the CT cervical spine. My interpretation: No fracture Radiology interpretation:  IMPRESSION:  1. No evidence of acute traumatic injury.  2. Interval increase in size of innumerable enlarged bilateral cervical lymph  nodes, including a right level 2B node measuring 1.1 cm (previously 0.8 cm).  Finding concerning for slow growing lymphoma/lymphoproliferative disorder.  Recommend outpatient work up.  3. Emphysema of the bilateral lung apices; pulmonary emphysema is an  independent risk factor for lung cancer and consideration is recommended for  evaluation for a low-dose CT lung cancer screening program.  PROCEDURES:  Critical Care performed: No    MEDICATIONS ORDERED IN ED: Medications - No data to display   IMPRESSION / MDM / ASSESSMENT AND PLAN / ED COURSE  I reviewed the triage vital signs and the nursing notes.                              Differential diagnosis includes, but is not limited to, fracture, dislocation, contusion  Patient's presentation is most consistent with acute presentation with potential threat to life or bodily function.   Patient presents to the emergency department today because concerns for left knee pain after a fall.  Patient states he also hit his head.  On exam there is some tenderness to the left knee.  Imaging fortunately does not show any fracture or dislocation of the knee.  Additionally CT head and cervical spine without concerning traumatic findings.  However there is some concerns for enlarged  lymph nodes.  Will refer to hematology oncology.     FINAL CLINICAL IMPRESSION(S) / ED DIAGNOSES   Final diagnoses:  Fall, initial encounter  Lymph node enlargement        Rx / DC Orders   ED Discharge Orders     None        Note:  This document was prepared using Dragon voice recognition software and may include unintentional dictation errors.    Floy Roberts, MD 09/23/24 [phone]  "

## 2024-09-23 NOTE — ED Notes (Signed)
 Patient returned

## 2024-09-23 NOTE — ED Notes (Signed)
 Patient to CT via wheelchair.

## 2024-10-27 ENCOUNTER — Emergency Department
Admission: EM | Admit: 2024-10-27 | Discharge: 2024-10-28 | Disposition: A | Discharge status: Other Acute Inpatient Hospital

## 2024-10-27 DIAGNOSIS — F1094 Alcohol use, unspecified with alcohol-induced mood disorder: Secondary | ICD-10-CM | POA: Diagnosis present

## 2024-10-27 DIAGNOSIS — F1914 Other psychoactive substance abuse with psychoactive substance-induced mood disorder: Secondary | ICD-10-CM | POA: Insufficient documentation

## 2024-10-27 DIAGNOSIS — Y908 Blood alcohol level of 240 mg/100 ml or more: Secondary | ICD-10-CM | POA: Insufficient documentation

## 2024-10-27 DIAGNOSIS — F10920 Alcohol use, unspecified with intoxication, uncomplicated: Secondary | ICD-10-CM | POA: Insufficient documentation

## 2024-10-27 DIAGNOSIS — R45850 Homicidal ideations: Secondary | ICD-10-CM | POA: Insufficient documentation

## 2024-10-27 DIAGNOSIS — F10929 Alcohol use, unspecified with intoxication, unspecified: Secondary | ICD-10-CM

## 2024-10-27 DIAGNOSIS — F1994 Other psychoactive substance use, unspecified with psychoactive substance-induced mood disorder: Secondary | ICD-10-CM | POA: Diagnosis present

## 2024-10-27 LAB — BASIC METABOLIC PANEL WITH GFR
Anion gap: 15 (ref 5–15)
BUN: 10 mg/dL (ref 8–23)
CO2: 22 mmol/L (ref 22–32)
Calcium: 8.5 mg/dL — ABNORMAL LOW (ref 8.9–10.3)
Chloride: 106 mmol/L (ref 98–111)
Creatinine, Ser: 0.88 mg/dL (ref 0.61–1.24)
GFR, Estimated: >60 mL/min
Glucose, Bld: 154 mg/dL — ABNORMAL HIGH (ref 70–99)
Potassium: 3.3 mmol/L — ABNORMAL LOW (ref 3.5–5.1)
Sodium: 143 mmol/L (ref 135–145)

## 2024-10-27 LAB — CBC
HCT: 43.4 % (ref 39.0–52.0)
Hemoglobin: 14.1 g/dL (ref 13.0–17.0)
MCH: 32.9 pg (ref 26.0–34.0)
MCHC: 32.5 g/dL (ref 30.0–36.0)
MCV: 101.2 fL — ABNORMAL HIGH (ref 80.0–100.0)
Platelets: 141 10*3/uL — ABNORMAL LOW (ref 150–400)
RBC: 4.29 MIL/uL (ref 4.22–5.81)
RDW: 12.7 % (ref 11.5–15.5)
WBC: 10.4 10*3/uL (ref 4.0–10.5)
nRBC: 0 % (ref 0.0–0.2)

## 2024-10-27 LAB — ETHANOL: Alcohol, Ethyl (B): 290 mg/dL — ABNORMAL HIGH

## 2024-10-27 MED ORDER — NICOTINE 21 MG/24HR TD PT24
21.0000 mg | MEDICATED_PATCH | Freq: Every day | TRANSDERMAL | Status: DC
  Administered 2024-10-28: 21 mg via TRANSDERMAL
  Filled 2024-10-27: qty 1

## 2024-10-27 MED ORDER — LORAZEPAM 2 MG/ML IJ SOLN: 0.0000 mg | Freq: Two times a day (BID) | INTRAMUSCULAR | Status: DC

## 2024-10-27 MED ORDER — LORAZEPAM 2 MG PO TABS: 0.0000 mg | ORAL_TABLET | Freq: Four times a day (QID) | ORAL | Status: DC

## 2024-10-27 MED ORDER — THIAMINE MONONITRATE 100 MG PO TABS
100.0000 mg | ORAL_TABLET | Freq: Every day | ORAL | Status: DC
  Administered 2024-10-28: 100 mg via ORAL
  Filled 2024-10-27: qty 1

## 2024-10-27 MED ORDER — HALOPERIDOL LACTATE 5 MG/ML IJ SOLN
5.0000 mg | Freq: Once | INTRAMUSCULAR | Status: AC
  Administered 2024-10-27: 5 mg via INTRAMUSCULAR
  Filled 2024-10-27: qty 1

## 2024-10-27 MED ORDER — THIAMINE HCL 100 MG/ML IJ SOLN: 100.0000 mg | Freq: Every day | INTRAMUSCULAR | Status: DC

## 2024-10-27 MED ORDER — LORAZEPAM 2 MG PO TABS: 0.0000 mg | ORAL_TABLET | Freq: Two times a day (BID) | ORAL | Status: DC

## 2024-10-27 MED ORDER — LORAZEPAM 2 MG/ML IJ SOLN: 0.0000 mg | Freq: Four times a day (QID) | INTRAMUSCULAR | Status: DC

## 2024-10-27 MED ORDER — LORAZEPAM 2 MG/ML IJ SOLN
2.0000 mg | Freq: Once | INTRAMUSCULAR | Status: AC
  Administered 2024-10-27: 2 mg via INTRAMUSCULAR
  Filled 2024-10-27: qty 1

## 2024-10-27 NOTE — ED Notes (Signed)
 Pt sleep, will obtain vitals once pt wakes up.

## 2024-10-27 NOTE — ED Triage Notes (Signed)
 BIBEMS grossly intoxicated and endorsing HI towards everyone. Agitated and yelling on arrival. Bedbugs noted to be on pt by EMS. Changed into hospital scrubs and deconned.   Past Medical History:  Diagnosis Date   Alcohol intoxication    Anticoagulant long-term use    lifetime use, coumadin therapy, then Xarelto    Anxiety    Arthritis    Cancer (HCC)    renal   Carotid atherosclerosis    <50% left and right   Clotting disorder    COPD (chronic obstructive pulmonary disease) (HCC)    Depression    Elevated liver enzymes 11/2013   Family history of cancer    Fatty liver    H/O drug abuse (HCC)    H/O ETOH abuse    Hepatitis    History of traumatic brain injury    Hypertension    Lymphadenopathy    Obesity    Personal history of DVT (deep vein thrombosis) 06/2013   Pre-diabetes    Primary osteoarthritis of left knee    Pulmonary embolism and infarction (HCC) 06/2013   Pulmonary embolism and infarction (HCC) 11/2013   Pulmonary embolus with infarction [facility])    Renal cell carcinoma (HCC)    Renal mass, left 06/2013   with ureteral obstruction   Stroke (HCC)    Subarachnoid hemorrhage following injury (HCC)    Thrombocythemia    Thrombocytopenia    Tobacco abuse

## 2024-10-27 NOTE — BH Assessment (Signed)
 TTS unable to complete consult due to patient being too impaired to participate in the interview. Patient labs haven't resulted at this time.

## 2024-10-27 NOTE — ED Notes (Signed)
 IVC PENDING  CONSULT ?

## 2024-10-27 NOTE — ED Provider Notes (Signed)
 "  [facility] Provider Note    Event Date/Time   First MD Initiated Contact with Patient 10/27/24 1643     (approximate)   History   Alcohol Intoxication and Homicidal   HPI  Randall Harvey. is a 64 y.o. male presenting with concern of homicidal statements.  Found by bystander making threats to kill everybody and brought in by EMS.  When asked why he is brought in he tells me initially I do not know , then tells me I think I may have stopped breathing, he not able to articulate much further on this, he then informs me that he wants to go and shoot up every body.  He states that he does have access to a gun and he plans to shooting people in the ear.  When asked if there is anyone in particular he is not able to verbalize this.  I reviewed his chart has had multiple visits for acute alcohol intoxication and appears to be intoxicated as well today.     Physical Exam   Triage Vital Signs: ED Triage Vitals  Encounter Vitals Group     BP      Girls Systolic BP Percentile      Girls Diastolic BP Percentile      Boys Systolic BP Percentile      Boys Diastolic BP Percentile      Pulse      Resp      Temp      Temp src      SpO2      Weight      Height      Head Circumference      Peak Flow      Pain Score      Pain Loc      Pain Education      Exclude from Growth Chart     Most recent vital signs: There were no vitals filed for this visit.   General: Awake, no distress.  CV:  Good peripheral perfusion.  Resp:  Normal effort.  Abd:  No distention.  Psych:  Acutely intoxicated, unable to walk in a straight line, no noted slurred speech, does verbalize homicidality Other:     ED Results / Procedures / Treatments   Labs (all labs ordered are listed, but only abnormal results are displayed) Labs Reviewed  CBC - Abnormal; Notable for the following components:      Result Value   MCV 101.2 (*)    Platelets 141 (*)    All other components  within normal limits  BASIC METABOLIC PANEL WITH GFR - Abnormal; Notable for the following components:   Potassium 3.3 (*)    Glucose, Bld 154 (*)    Calcium 8.5 (*)    All other components within normal limits  ETHANOL - Abnormal; Notable for the following components:   Alcohol, Ethyl (B) 290 (*)    All other components within normal limits  URINE DRUG SCREEN     EKG     RADIOLOGY   PROCEDURES:  Critical Care performed: No  Procedures   MEDICATIONS ORDERED IN ED: Medications  LORazepam  (ATIVAN ) injection 0-4 mg ( Intravenous Not Given 10/27/24 1731)    Or  LORazepam  (ATIVAN ) tablet 0-4 mg ( Oral See Alternative 10/27/24 1731)  LORazepam  (ATIVAN ) injection 0-4 mg (has no administration in time range)    Or  LORazepam  (ATIVAN ) tablet 0-4 mg (has no administration in time range)  thiamine  (VITAMIN B1)  tablet 100 mg (100 mg Oral Not Given 10/27/24 1732)    Or  thiamine  (VITAMIN B1) injection 100 mg ( Intravenous See Alternative 10/27/24 1732)  nicotine  (NICODERM CQ  - dosed in mg/24 hours) patch 21 mg (21 mg Transdermal Not Given 10/27/24 1732)  LORazepam  (ATIVAN ) injection 2 mg (2 mg Intramuscular Given 10/27/24 1721)  haloperidol  lactate (HALDOL ) injection 5 mg (5 mg Intramuscular Given 10/27/24 1722)     IMPRESSION / MDM / ASSESSMENT AND PLAN / ED COURSE  I reviewed the triage vital signs and the nursing notes.                               Patient's presentation is most consistent with acute presentation with potential threat to life or bodily function.  64 year old male who is brought in due to concern of homicidal statements.  He continues to verbalize thoughts of wanting to shoot everybody, unclear if this is secondary to intoxication or actual plan to hurt other people.  He appears to be intoxicated at this time, and has had multiple visits for alcohol intoxication.  Will obtain labs here, unfortunately he needs to have IVC initiated given his statements.  Attempted  to leave so we will have to chemically restrain him for now.  The patient has been placed in psychiatric observation due to the need to provide a safe environment for the patient while obtaining psychiatric consultation and evaluation, as well as ongoing medical and medication management to treat the patient's condition.  The patient has been placed under full IVC at this time.    Clinical Course as of 10/27/24 1938  Thu Oct 27, 2024  1932 Ethanol will clear at about 1am. [SK]    Clinical Course User Index [SK] Fernand Rossie HERO, MD     FINAL CLINICAL IMPRESSION(S) / ED DIAGNOSES   Final diagnoses:  Alcoholic intoxication with complication  Homicidal behavior     Rx / DC Orders   ED Discharge Orders     None        Note:  This document was prepared using Dragon voice recognition software and may include unintentional dictation errors.   Fernand Rossie HERO, MD 10/27/24 [phone]  "

## 2024-10-27 NOTE — BH Assessment (Signed)
 TTS requested [facility] Consult via phone call and secure chat.

## 2024-10-28 ENCOUNTER — Encounter: Payer: Self-pay | Admitting: Psychiatry

## 2024-10-28 ENCOUNTER — Inpatient Hospital Stay
Admission: RE | Admit: 2024-10-28 | Discharge: 2024-11-03 | Disposition: A | Source: Intra-hospital | Attending: Psychiatry | Admitting: Psychiatry

## 2024-10-28 ENCOUNTER — Other Ambulatory Visit: Payer: Self-pay

## 2024-10-28 DIAGNOSIS — F332 Major depressive disorder, recurrent severe without psychotic features: Principal | ICD-10-CM | POA: Diagnosis present

## 2024-10-28 MED ORDER — THIAMINE HCL 100 MG/ML IJ SOLN: 100.0000 mg | Freq: Every day | INTRAMUSCULAR | Status: DC

## 2024-10-28 MED ORDER — HALOPERIDOL LACTATE 5 MG/ML IJ SOLN: 5.0000 mg | Freq: Three times a day (TID) | INTRAMUSCULAR | Status: DC | PRN

## 2024-10-28 MED ORDER — HYDROXYZINE HCL 25 MG PO TABS
25.0000 mg | ORAL_TABLET | Freq: Four times a day (QID) | ORAL | Status: DC | PRN
  Administered 2024-10-28 – 2024-11-02 (×3): 25 mg via ORAL
  Filled 2024-10-28 (×3): qty 1

## 2024-10-28 MED ORDER — TRAZODONE HCL 50 MG PO TABS
50.0000 mg | ORAL_TABLET | Freq: Every evening | ORAL | Status: DC | PRN
  Administered 2024-10-28 – 2024-11-02 (×5): 50 mg via ORAL
  Filled 2024-10-28 (×5): qty 1

## 2024-10-28 MED ORDER — LORAZEPAM 2 MG PO TABS: 0.0000 mg | ORAL_TABLET | Freq: Two times a day (BID) | ORAL | Status: DC

## 2024-10-28 MED ORDER — HALOPERIDOL 5 MG PO TABS
5.0000 mg | ORAL_TABLET | Freq: Three times a day (TID) | ORAL | Status: DC | PRN
  Administered 2024-10-30: 5 mg via ORAL
  Filled 2024-10-28: qty 1

## 2024-10-28 MED ORDER — PNEUMOCOCCAL 20-VAL CONJ VACC 0.5 ML IM SUSY
0.5000 mL | PREFILLED_SYRINGE | INTRAMUSCULAR | Status: DC
  Filled 2024-10-28: qty 0.5

## 2024-10-28 MED ORDER — LORAZEPAM 2 MG PO TABS
0.0000 mg | ORAL_TABLET | Freq: Four times a day (QID) | ORAL | Status: DC
  Administered 2024-10-28: 1 mg via ORAL
  Filled 2024-10-28 (×2): qty 1

## 2024-10-28 MED ORDER — LORAZEPAM 2 MG/ML IJ SOLN: 0.0000 mg | Freq: Two times a day (BID) | INTRAMUSCULAR | Status: DC

## 2024-10-28 MED ORDER — THIAMINE MONONITRATE 100 MG PO TABS
100.0000 mg | ORAL_TABLET | Freq: Every day | ORAL | Status: DC
  Administered 2024-10-29 – 2024-11-03 (×6): 100 mg via ORAL
  Filled 2024-10-28 (×6): qty 1

## 2024-10-28 MED ORDER — LORAZEPAM 2 MG/ML IJ SOLN: 0.0000 mg | Freq: Four times a day (QID) | INTRAMUSCULAR | Status: DC

## 2024-10-28 MED ORDER — LORAZEPAM 2 MG/ML IJ SOLN: 2.0000 mg | Freq: Three times a day (TID) | INTRAMUSCULAR | Status: DC | PRN

## 2024-10-28 MED ORDER — INFLUENZA VAC SPLIT HIGH-DOSE 0.5 ML IM SUSY
0.5000 mL | PREFILLED_SYRINGE | INTRAMUSCULAR | Status: DC
  Filled 2024-10-28: qty 0.5

## 2024-10-28 MED ORDER — ACETAMINOPHEN 325 MG PO TABS
650.0000 mg | ORAL_TABLET | Freq: Four times a day (QID) | ORAL | Status: DC | PRN
  Administered 2024-10-29 – 2024-11-03 (×5): 650 mg via ORAL
  Filled 2024-10-28 (×6): qty 2

## 2024-10-28 MED ORDER — ALUM & MAG HYDROXIDE-SIMETH 200-200-20 MG/5ML PO SUSP: 30.0000 mL | ORAL | Status: DC | PRN

## 2024-10-28 MED ORDER — DIPHENHYDRAMINE HCL 25 MG PO CAPS: 25.0000 mg | ORAL_CAPSULE | Freq: Four times a day (QID) | ORAL | Status: DC | PRN

## 2024-10-28 MED ORDER — NICOTINE 21 MG/24HR TD PT24
21.0000 mg | MEDICATED_PATCH | Freq: Every day | TRANSDERMAL | Status: DC
  Administered 2024-10-28 – 2024-11-03 (×7): 21 mg via TRANSDERMAL
  Filled 2024-10-28 (×7): qty 1

## 2024-10-28 MED ORDER — DIPHENHYDRAMINE HCL 50 MG/ML IJ SOLN: 50.0000 mg | Freq: Three times a day (TID) | INTRAMUSCULAR | Status: DC | PRN

## 2024-10-28 MED ORDER — MAGNESIUM HYDROXIDE 400 MG/5ML PO SUSP: 30.0000 mL | Freq: Every day | ORAL | Status: DC | PRN

## 2024-10-28 MED ORDER — HALOPERIDOL LACTATE 5 MG/ML IJ SOLN: 10.0000 mg | Freq: Three times a day (TID) | INTRAMUSCULAR | Status: DC | PRN

## 2024-10-28 MED ORDER — NICOTINE 21 MG/24HR TD PT24: 21.0000 mg | MEDICATED_PATCH | Freq: Every day | TRANSDERMAL | Status: DC

## 2024-10-28 MED ORDER — DIPHENHYDRAMINE HCL 25 MG PO CAPS
50.0000 mg | ORAL_CAPSULE | Freq: Three times a day (TID) | ORAL | Status: DC | PRN
  Administered 2024-10-30: 50 mg via ORAL
  Filled 2024-10-28: qty 2

## 2024-10-28 NOTE — Group Note (Signed)
 Recreation Therapy Group Note   Group Topic:Leisure Education  Group Date: 10/28/2024 Start Time: 1530 End Time: 1620 Facilitators: Celestia Jeoffrey FORBES ARTICE, CTRS Location: Craft Room  Group Description: Leisure. Patients were given the option to choose from journaling, coloring, drawing, making origami, playing with playdoh, listening to music or singing karaoke. LRT and pts discussed the meaning of leisure, the importance of participating in leisure during their free time/when they're outside of the hospital, as well as how our leisure interests can also serve as coping skills.   Goal Area(s) Addressed:  Patient will identify a current leisure interest.  Patient will learn the definition of leisure. Patient will practice making a positive decision. Patient will have the opportunity to try a new leisure activity. Patient will communicate with peers and LRT.    Affect/Mood: N/A   Participation Level: Did not attend    Clinical Observations/Individualized Feedback: Patient did not attend due to being a new admission.   Plan: Continue to engage patient in RT group sessions 2-3x/week.   Jeoffrey FORBES Celestia, LRT, CTRS 10/28/2024 5:07 PM

## 2024-10-28 NOTE — ED Notes (Signed)
ivc/psych consult ordered/pending.Marland Kitchen

## 2024-10-28 NOTE — ED Notes (Signed)
 Meal provided

## 2024-10-28 NOTE — Plan of Care (Signed)
 " Problem: Education: Goal: Knowledge of [place] [facility] Education information/materials will improve 10/28/2024 2027 by Trudy Arland CROME, RN Outcome: Progressing 10/28/2024 2026 by Trudy Arland CROME, RN Outcome: Progressing Goal: Emotional status will improve 10/28/2024 2027 by Trudy Arland CROME, RN Outcome: Progressing 10/28/2024 2026 by Trudy Arland CROME, RN Outcome: Progressing Goal: Mental status will improve 10/28/2024 2027 by Trudy Arland CROME, RN Outcome: Progressing 10/28/2024 2026 by Trudy Arland CROME, RN Outcome: Not Met (add Reason) Goal: Verbalization of understanding the information provided will improve 10/28/2024 2027 by Trudy Arland CROME, RN Outcome: Progressing 10/28/2024 2026 by Trudy Arland CROME, RN Outcome: Not Met (add Reason)   Problem: Activity: Goal: Interest or engagement in activities will improve 10/28/2024 2027 by Trudy Arland CROME, RN Outcome: Progressing 10/28/2024 2026 by Trudy Arland CROME, RN Outcome: Not Met (add Reason) Goal: Sleeping patterns will improve 10/28/2024 2027 by Trudy Arland CROME, RN Outcome: Progressing 10/28/2024 2026 by Trudy Arland CROME, RN Outcome: Not Met (add Reason)   Problem: Coping: Goal: Ability to verbalize frustrations and anger appropriately will improve 10/28/2024 2027 by Trudy Arland CROME, RN Outcome: Progressing 10/28/2024 2026 by Trudy Arland CROME, RN Outcome: Not Met (add Reason) Goal: Ability to demonstrate self-control will improve 10/28/2024 2027 by Trudy Arland CROME, RN Outcome: Progressing 10/28/2024 2026 by Trudy Arland CROME, RN Outcome: Not Met (add Reason)   Problem: Health Behavior/Discharge Planning: Goal: Identification of resources available to assist in meeting health care needs will improve 10/28/2024 2027 by Trudy Arland CROME, RN Outcome: Progressing 10/28/2024 2026 by Trudy Arland CROME, RN Outcome: Not Met (add Reason) Goal: Compliance with treatment plan for underlying cause of condition will  improve 10/28/2024 2027 by Trudy Arland CROME, RN Outcome: Progressing 10/28/2024 2026 by Trudy Arland CROME, RN Outcome: Not Met (add Reason)   Problem: Physical Regulation: Goal: Ability to maintain clinical measurements within normal limits will improve 10/28/2024 2027 by Trudy Arland CROME, RN Outcome: Progressing 10/28/2024 2026 by Trudy Arland CROME, RN Outcome: Not Met (add Reason)   Problem: Safety: Goal: Periods of time without injury will increase 10/28/2024 2027 by Trudy Arland CROME, RN Outcome: Progressing 10/28/2024 2026 by Trudy Arland CROME, RN Outcome: Not Met (add Reason)   Problem: Education: Goal: Utilization of techniques to improve thought processes will improve 10/28/2024 2027 by Trudy Arland CROME, RN Outcome: Progressing 10/28/2024 2026 by Trudy Arland CROME, RN Outcome: Not Met (add Reason) Goal: Knowledge of the prescribed therapeutic regimen will improve 10/28/2024 2027 by Trudy Arland CROME, RN Outcome: Progressing 10/28/2024 2026 by Trudy Arland CROME, RN Outcome: Not Met (add Reason)   Problem: Activity: Goal: Interest or engagement in leisure activities will improve 10/28/2024 2027 by Trudy Arland CROME, RN Outcome: Progressing 10/28/2024 2026 by Trudy Arland CROME, RN Outcome: Not Met (add Reason) Goal: Imbalance in normal sleep/wake cycle will improve 10/28/2024 2027 by Trudy Arland CROME, RN Outcome: Progressing 10/28/2024 2026 by Trudy Arland CROME, RN Outcome: Not Met (add Reason)   Problem: Coping: Goal: Coping ability will improve 10/28/2024 2027 by Trudy Arland CROME, RN Outcome: Progressing 10/28/2024 2026 by Trudy Arland CROME, RN Outcome: Not Met (add Reason) Goal: Will verbalize feelings 10/28/2024 2027 by Trudy Arland CROME, RN Outcome: Progressing 10/28/2024 2026 by Trudy Arland CROME, RN Outcome: Not Met (add Reason)   Problem: Health Behavior/Discharge Planning: Goal: Ability to make decisions will improve 10/28/2024 2027 by Trudy Arland CROME,  RN Outcome: Progressing 10/28/2024 2026 by Trudy Arland CROME, RN Outcome: Not Met (add Reason) Goal: Compliance with therapeutic regimen  will improve 10/28/2024 2027 by Trudy Arland CROME, RN Outcome: Progressing 10/28/2024 2026 by Trudy Arland CROME, RN Outcome: Not Met (add Reason)   Problem: Role Relationship: Goal: Will demonstrate positive changes in social behaviors and relationships 10/28/2024 2027 by Trudy Arland CROME, RN Outcome: Progressing 10/28/2024 2026 by Trudy Arland CROME, RN Outcome: Not Met (add Reason)   Problem: Safety: Goal: Ability to disclose and discuss suicidal ideas will improve 10/28/2024 2027 by Trudy Arland CROME, RN Outcome: Progressing 10/28/2024 2026 by Trudy Arland CROME, RN Outcome: Not Met (add Reason) Goal: Ability to identify and utilize support systems that promote safety will improve 10/28/2024 2027 by Trudy Arland CROME, RN Outcome: Progressing 10/28/2024 2026 by Trudy Arland CROME, RN Outcome: Not Met (add Reason)   Problem: Self-Concept: Goal: Will verbalize positive feelings about self 10/28/2024 2027 by Trudy Arland CROME, RN Outcome: Progressing 10/28/2024 2026 by Trudy Arland CROME, RN Outcome: Not Met (add Reason) Goal: Level of anxiety will decrease 10/28/2024 2027 by Trudy Arland CROME, RN Outcome: Progressing 10/28/2024 2026 by Trudy Arland CROME, RN Outcome: Not Met (add Reason)   Problem: Education: Goal: Ability to make informed decisions regarding treatment will improve 10/28/2024 2027 by Trudy Arland CROME, RN Outcome: Progressing 10/28/2024 2026 by Trudy Arland CROME, RN Outcome: Not Met (add Reason)   Problem: Coping: Goal: Coping ability will improve 10/28/2024 2027 by Trudy Arland CROME, RN Outcome: Progressing 10/28/2024 2026 by Trudy Arland CROME, RN Outcome: Not Met (add Reason)   Problem: Health Behavior/Discharge Planning: Goal: Identification of resources available to assist in meeting health care needs will improve 10/28/2024  2027 by Trudy Arland CROME, RN Outcome: Progressing 10/28/2024 2026 by Trudy Arland CROME, RN Outcome: Not Met (add Reason)   Problem: Medication: Goal: Compliance with prescribed medication regimen will improve 10/28/2024 2027 by Trudy Arland CROME, RN Outcome: Progressing 10/28/2024 2026 by Trudy Arland CROME, RN Outcome: Not Met (add Reason)   Problem: Self-Concept: Goal: Ability to disclose and discuss suicidal ideas will improve 10/28/2024 2027 by Trudy Arland CROME, RN Outcome: Progressing 10/28/2024 2026 by Trudy Arland CROME, RN Outcome: Not Met (add Reason)   Problem: Education: Goal: Ability to state activities that reduce stress will improve 10/28/2024 2027 by Trudy Arland CROME, RN Outcome: Progressing 10/28/2024 2026 by Trudy Arland CROME, RN Outcome: Not Met (add Reason)   Problem: Coping: Goal: Ability to identify and develop effective coping behavior will improve 10/28/2024 2027 by Trudy Arland CROME, RN Outcome: Progressing 10/28/2024 2026 by Trudy Arland CROME, RN Outcome: Not Met (add Reason)   Problem: Self-Concept: Goal: Ability to identify factors that promote anxiety will improve 10/28/2024 2027 by Trudy Arland CROME, RN Outcome: Progressing 10/28/2024 2026 by Trudy Arland CROME, RN Outcome: Not Met (add Reason) Goal: Level of anxiety will decrease 10/28/2024 2027 by Trudy Arland CROME, RN Outcome: Progressing 10/28/2024 2026 by Trudy Arland CROME, RN Outcome: Not Met (add Reason) Goal: Ability to modify response to factors that promote anxiety will improve 10/28/2024 2027 by Trudy Arland CROME, RN Outcome: Progressing 10/28/2024 2026 by Trudy Arland CROME, RN Outcome: Not Met (add Reason)   "

## 2024-10-28 NOTE — Progress Notes (Signed)
 Patient admitted to the BMU unit on 10/28/24 under involuntary status. Patient presents with complaints of anxiety and depression. It is noted from admission report from ED that patient had arrived to the ED intoxicated; in admission interview patient denies drinking every day but does have multiple occasions where he drinks a pint or a fifth.  Patient denied withdrawal symptoms at this time.    On arrival, patient appears disheveled with fair eye contact and a sad facial expression. Affect is sad and congruent with stated mood. Speech is soft, and motor activity is slow. Patient is cooperative with staff and demonstrates assertive interaction.  Thought process is concrete, and thought content is notable for preoccupation. No delusions or hallucinations are reported or observed. Perception is within defined limits. Patient denies confusion but displays impaired judgment. Patient denies suicidal ideation and reports no intent to harm self. No danger to others is reported or observed.  Patient oriented to unit, room, and safety procedures. Belongings secured per protocol. Patient encouraged to communicate needs to staff and participate in treatment planning. Will continue to monitor mood, behavior, and safety per unit standards.  10/28/24 1700  Psych Admission Type (Psych Patients Only)  Admission Status Involuntary  Psychosocial Assessment  Patient Complaints Anxiety;Depression  Eye Contact Fair  Facial Expression Sad  Affect Sad  Speech Soft  Interaction Assertive  Motor Activity Slow  Appearance/Hygiene Disheveled  Behavior Characteristics Cooperative  Mood Sad  Thought Process  Coherency Concrete thinking  Content Preoccupation  Delusions None reported or observed  Perception WDL  Hallucination None reported or observed  Judgment Impaired  Confusion None  Danger to Self  Current suicidal ideation? Denies  Danger to Others  Danger to Others None reported or observed

## 2024-10-28 NOTE — ED Notes (Signed)
 Patient sleeping

## 2024-10-28 NOTE — ED Provider Notes (Signed)
 Emergency Medicine Observation Re-evaluation Note   There were no vitals taken for this visit.   ED Course / MDM   No reported events during my shift at the time of this note.   Pt is awaiting dispo from consultants   Ginnie Shams MD    Shams Ginnie, MD 10/28/24 [phone]
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
## 2024-10-28 NOTE — ED Notes (Signed)
 PATIENT TALKING WITH TTS.

## 2024-10-28 NOTE — Progress Notes (Signed)
 Patient has been accepted to [facility] BMU 10/28/24. Patient assigned to room 311 Accepting physician is Dr. Jadapalle. Call report to [phone]. Representative was [facility].   ER Staff is aware of it: Luann, ER Secretary Dr. Ernest, ER MD Andrea, RN Patient's Nurse

## 2024-10-28 NOTE — Group Note (Signed)
 Date:  10/28/2024 Time:  10:11 PM  Additional Comments:  Didn't Attend  Randall Harvey 10/28/2024, 10:11 PM

## 2024-10-28 NOTE — Plan of Care (Signed)
" °  Problem: Education: Goal: Knowledge of [place] [facility] Education information/materials will improve Outcome: Not Applicable   "

## 2024-10-28 NOTE — Plan of Care (Signed)
   Problem: Education: Goal: Emotional status will improve Outcome: Progressing

## 2024-10-28 NOTE — ED Notes (Signed)
Meal tray and warm blankets provided.  

## 2024-10-28 NOTE — ED Notes (Signed)
 Patient sitting on side of bed

## 2024-10-29 LAB — LIPID PANEL
Cholesterol: 151 mg/dL (ref 0–200)
HDL: 52 mg/dL
LDL Cholesterol: 73 mg/dL (ref 0–99)
Total CHOL/HDL Ratio: 2.9 ratio
Triglycerides: 130 mg/dL
VLDL: 26 mg/dL (ref 0–40)

## 2024-10-29 LAB — HEMOGLOBIN A1C
Hgb A1c MFr Bld: 5.5 % (ref 4.8–5.6)
Mean Plasma Glucose: 111.15 mg/dL

## 2024-10-29 MED ORDER — CHLORDIAZEPOXIDE HCL 25 MG PO CAPS
25.0000 mg | ORAL_CAPSULE | Freq: Four times a day (QID) | ORAL | Status: AC | PRN
  Administered 2024-11-01: 25 mg via ORAL
  Filled 2024-10-29: qty 1

## 2024-10-29 MED ORDER — ONDANSETRON 4 MG PO TBDP: 4.0000 mg | ORAL_TABLET | Freq: Four times a day (QID) | ORAL | Status: AC | PRN

## 2024-10-29 MED ORDER — LOPERAMIDE HCL 2 MG PO CAPS: 2.0000 mg | ORAL_CAPSULE | ORAL | Status: AC | PRN

## 2024-10-29 MED ORDER — CHLORDIAZEPOXIDE HCL 25 MG PO CAPS
25.0000 mg | ORAL_CAPSULE | ORAL | Status: AC
  Administered 2024-10-31 – 2024-11-01 (×2): 25 mg via ORAL
  Filled 2024-10-29 (×2): qty 1

## 2024-10-29 MED ORDER — CHLORDIAZEPOXIDE HCL 25 MG PO CAPS
25.0000 mg | ORAL_CAPSULE | Freq: Every day | ORAL | Status: AC
  Administered 2024-11-02: 25 mg via ORAL
  Filled 2024-10-29: qty 1

## 2024-10-29 MED ORDER — ESCITALOPRAM OXALATE 10 MG PO TABS
5.0000 mg | ORAL_TABLET | Freq: Every day | ORAL | Status: DC
  Administered 2024-10-30 – 2024-10-31 (×2): 5 mg via ORAL
  Filled 2024-10-29 (×2): qty 1

## 2024-10-29 MED ORDER — CHLORDIAZEPOXIDE HCL 25 MG PO CAPS
25.0000 mg | ORAL_CAPSULE | Freq: Three times a day (TID) | ORAL | Status: AC
  Administered 2024-10-30 – 2024-10-31 (×3): 25 mg via ORAL
  Filled 2024-10-29 (×3): qty 1

## 2024-10-29 MED ORDER — ADULT MULTIVITAMIN W/MINERALS CH
1.0000 | ORAL_TABLET | Freq: Every day | ORAL | Status: DC
  Administered 2024-10-30 – 2024-11-03 (×5): 1 via ORAL
  Filled 2024-10-29 (×5): qty 1

## 2024-10-29 MED ORDER — CHLORDIAZEPOXIDE HCL 25 MG PO CAPS
25.0000 mg | ORAL_CAPSULE | Freq: Four times a day (QID) | ORAL | Status: AC
  Administered 2024-10-29 – 2024-10-30 (×4): 25 mg via ORAL
  Filled 2024-10-29 (×4): qty 1

## 2024-10-29 NOTE — Plan of Care (Signed)
   Problem: Education: Goal: Emotional status will improve Outcome: Progressing Goal: Mental status will improve Outcome: Progressing

## 2024-10-29 NOTE — Progress Notes (Signed)
 SPIRITUAL CARE AND COUNSELING CONSULT NOTE   VISIT SUMMARY Chaplain visited patient and listened as he shared about his grief and addiction.  Chaplain prayed with patient at his request.    SPIRITUAL ENCOUNTER                                                                                                                                                                      Type of Visit: Initial Care provided to:: Patient Conversation partners present during encounter: Nurse Referral source: Patient request Reason for visit: Routine spiritual support OnCall Visit: Yes   SPIRITUAL FRAMEWORK  Presenting Themes: Impactful experiences and emotions, Coping tools, Courage hope and growth Community/Connection: Family, Other (comment) (Girlfriend) Patient Stress Factors: Health changes, Other (Comment), Loss (Intimate relationship) Family Stress Factors: None identified   GOALS       INTERVENTIONS   Spiritual Care Interventions Made: Compassionate presence, Normalization of emotions, Reflective listening    INTERVENTION OUTCOMES      SPIRITUAL CARE PLAN   Recommendations for Clinical Staff: Perhaps patient would benefit from group support like AA.    If immediate needs arise, please contact [facility] 24 hour on call [phone].   Hart Moats, Chaplain  10/29/2024 12:10 PM

## 2024-10-29 NOTE — Group Note (Signed)
 Date:  10/29/2024 Time:  1:42 PM  Group Topic/Focus:  Stages of Change:   The focus of this group is to explain the stages of change and help patients identify changes they want to make upon discharge.    Participation Level:  Active  Participation Quality:  Appropriate  Affect:  Appropriate  Cognitive:  Appropriate  Insight: Appropriate  Engagement in Group:  Engaged  Modes of Intervention:  Activity  Additional Comments:    Randall Harvey 10/29/2024, 1:42 PM

## 2024-10-29 NOTE — Group Note (Signed)
 Date:  10/29/2024 Time:  10:31 PM  Group Topic/Focus:  Overcoming Stress:   The focus of this group is to define stress and help patients assess their triggers.    Participation Level:  Active  Participation Quality:  Appropriate  Affect:  Appropriate  Cognitive:  Appropriate  Insight: Appropriate  Engagement in Group:  Engaged  Modes of Intervention:  Activity  Additional Comments:    Camellia HERO Gwenna Fuston 10/29/2024, 10:31 PM

## 2024-10-29 NOTE — BHH Suicide Risk Assessment (Incomplete)
 [facility] Admission Suicide Risk Assessment   Nursing information obtained from:  Patient Demographic factors:  Age 64 or older, Caucasian, Male, Low socioeconomic status, Divorced or widowed, Unemployed Current Mental Status:  NA, Suicidal ideation indicated by others Loss Factors:  Decline in physical health, Financial problems / change in socioeconomic status Historical Factors:  Family history of mental illness or substance abuse, NA Risk Reduction Factors:  Living with another person, especially a relative, Positive social support  Total Time spent with patient: {Time; 15 min - 8 hours:17441} Principal Problem: MDD (major depressive disorder), recurrent severe, without psychosis (HCC) Diagnosis:  Principal Problem:   MDD (major depressive disorder), recurrent severe, without psychosis (HCC)  Subjective Data: ***  Continued Clinical Symptoms:  Alcohol Use Disorder Identification Test Final Score (AUDIT): 9 The Alcohol Use Disorders Identification Test, Guidelines for Use in Primary Care, Second Edition.  World Science Writer [facility]). Score between 0-7:  no or low risk or alcohol related problems. Score between 8-15:  moderate risk of alcohol related problems. Score between 16-19:  high risk of alcohol related problems. Score 20 or above:  warrants further diagnostic evaluation for alcohol dependence and treatment.   CLINICAL FACTORS:   {Clinical Factors:22706}   Musculoskeletal: Strength & Muscle Tone: {desc; muscle tone:32375} Gait & Station: {PE GAIT ED WJUO:77474} Patient leans: {Patient Leans:21022755}  Psychiatric Specialty Exam:  Presentation  General Appearance:  Appropriate for Environment; Casual  Eye Contact: Fair  Speech: Clear and Coherent  Speech Volume: Normal  Handedness: Right   Mood and Affect  Mood: Euthymic  Affect: Appropriate   Thought Process  Thought Processes: Coherent  Descriptions of Associations:Intact  Orientation:Full  (Time, Place and Person)  Thought Content:Logical  History of Schizophrenia/Schizoaffective disorder:No  Duration of Psychotic Symptoms:No data recorded Hallucinations:No data recorded Ideas of Reference:None  Suicidal Thoughts:No data recorded Homicidal Thoughts:No data recorded  Sensorium  Memory: Immediate Fair; Recent Fair; Remote Fair  Judgment: Fair  Insight: Fair   Art Therapist  Concentration: Fair  Attention Span: Fair  Recall: [employer] of Knowledge: Fair  Language: Fair   Psychomotor Activity  Psychomotor Activity:No data recorded  Assets  Assets: Communication Skills; Desire for Improvement; Resilience; Social Support   Sleep  Sleep:No data recorded   Physical Exam: Physical Exam ROS Blood pressure (!) 166/96, pulse 99, temperature 98.3 F (36.8 C), temperature source Oral, resp. rate 18, height 5' 7 (1.702 m), weight 73.5 kg, SpO2 98%. Body mass index is 25.37 kg/m.   COGNITIVE FEATURES THAT CONTRIBUTE TO RISK:  {Cognitive Features:304700251}    SUICIDE RISK:   {BHH SUICIDE RISK:22704}  PLAN OF CARE: ***  I certify that inpatient services furnished can reasonably be expected to improve the patient's condition.   Randall Rinehart, NP 10/29/2024, 12:26 PM

## 2024-10-29 NOTE — H&P (Signed)
 " Psychiatric Admission Assessment Adult  Patient Identification: Randall Harvey. MRN:  [id number] Date of Evaluation:  10/29/2024 Chief Complaint:  MDD (major depressive disorder), recurrent severe, without psychosis (HCC) [F33.2]   History of Present Illness: ***  Associated Signs/Symptoms: Depression Symptoms:  {DEPRESSION SYMPTOMS:20000} (Hypo) Manic Symptoms:  {BHH MANIC SYMPTOMS:22872} Anxiety Symptoms:  {BHH ANXIETY SYMPTOMS:22873} Psychotic Symptoms:  {BHH PSYCHOTIC SYMPTOMS:22874} PTSD Symptoms: {BHH PTSD SYMPTOMS:22875}  Did the patient present with any abnormal findings indicating the need for additional neurological or psychological testing?  {Yes/No:304960894}  Total Time spent with patient: {Time; 15 min - 8 hours:17441}  Psychiatric History:  Information collected from patient/chart  Prev Dx/Sx: *** Current Psych Provider: *** Home Meds (current): *** Previous Med Trials: *** Therapy: ***  Prior Psych Hospitalization: ***  Prior Self Harm: *** Prior Violence: ***  Family Psych History: *** Family Hx suicide: ***  Social History:  Educational Hx: *** Occupational Hx: *** Legal Hx: *** Living Situation: *** Spiritual Hx: *** Access to weapons/lethal means: ***   Substance History Alcohol: ***  Tobacco: *** Illicit drugs: *** Prescription drug abuse: *** Rehab hx: ***  Is the patient at risk to self? {yes no:314532}  Has the patient been a risk to self in the past 6 months? {yes no:314532}  Has the patient been a risk to self within the distant past? {yes no:314532}  Is the patient a risk to others? {yes no:314532}  Has the patient been a risk to others in the past 6 months? {yes no:314532}  Has the patient been a risk to others within the distant past? {yes no:314532}   Columbia Scale:  Flowsheet Row Admission (Current) from 10/28/2024 in [facility] INPATIENT BE[facility]AL [facility]ICINE ED from 10/27/2024 in [facility] Emergency Department at [facility]  ED from 09/23/2024 in [facility] Emergency Department at [facility]  C-SSRS RISK CATEGORY High Risk High Risk No Risk     Past Medical History:  Past Medical History:  Diagnosis Date   Alcohol intoxication    Anticoagulant long-term use    lifetime use, coumadin therapy, then Xarelto    Anxiety    Arthritis    Cancer (HCC)    renal   Carotid atherosclerosis    <50% left and right   Clotting disorder    COPD (chronic obstructive pulmonary disease) (HCC)    Depression    Elevated liver enzymes 11/2013   Family history of cancer    Fatty liver    H/O drug abuse (HCC)    H/O ETOH abuse    Hepatitis    History of traumatic brain injury    Hypertension    Lymphadenopathy    Obesity    Personal history of DVT (deep vein thrombosis) 06/2013   Pre-diabetes    Primary osteoarthritis of left knee    Pulmonary embolism and infarction (HCC) 06/2013   Pulmonary embolism and infarction (HCC) 11/2013   Pulmonary embolus with infarction [facility])    Renal cell carcinoma (HCC)    Renal mass, left 06/2013   with ureteral obstruction   Stroke (HCC)    Subarachnoid hemorrhage following injury (HCC)    Thrombocythemia    Thrombocytopenia    Tobacco abuse     Past Surgical History:  Procedure Laterality Date   ANKLE FRACTURE SURGERY Right 12/2012   ANKLE SURGERY     EXCISION METACARPAL MASS Right 09/18/2016   Procedure: EXCISION METACARPAL MASS;  Surgeon: Ozell Flake, MD;  Location: [facility] ORS;  Service: Orthopedics;  Laterality: Right;  5th digit    HERNIA REPAIR     Umbilical Hernia   ORBITAL FRACTURE SURGERY Left    ROBOTIC ASSITED PARTIAL NEPHRECTOMY Left Oct 2014   Family History:  Family History  Problem Relation Age of Onset   Pancreatic cancer Mother    Colon cancer Father    Diabetes Father    Colon cancer Sister    COPD Sister    Seizures Sister    Ovarian cancer Sister    Cancer Paternal Grandmother        bone   Stroke Paternal Grandfather    Heart disease  Paternal Grandfather    Hypertension Neg Hx     Social History:  Social History   Substance and Sexual Activity  Alcohol Use Yes   Alcohol/week: 12.0 standard drinks of alcohol   Types: 12 Cans of beer per week   Comment: alcohol abuse     Social History   Substance and Sexual Activity  Drug Use Not Currently   Types: Marijuana   Comment: occ      Allergies:  Allergies[1] Lab Results:  Results for orders placed or performed during the hospital encounter of 10/28/24 (from the past 48 hours)  Lipid panel     Status: None   Collection Time: 10/29/24  9:45 AM  Result Value Ref Range   Cholesterol 151 0 - 200 mg/dL    Comment:        ATP III CLASSIFICATION:  <200     mg/dL   Desirable  799-760  mg/dL   Borderline High  >=759    mg/dL   High           Triglycerides 130 <150 mg/dL   HDL 52 >59 mg/dL   Total CHOL/HDL Ratio 2.9 RATIO   VLDL 26 0 - 40 mg/dL   LDL Cholesterol 73 0 - 99 mg/dL    Comment:        Total Cholesterol/HDL:CHD Risk Coronary Heart Disease Risk Table                     Men   Women  1/2 Average Risk   3.4   3.3  Average Risk       5.0   4.4  2 X Average Risk   9.6   7.1  3 X Average Risk  23.4   11.0        Use the calculated Patient Ratio above and the CHD Risk Table to determine the patient's CHD Risk.        ATP III CLASSIFICATION (LDL):  <100     mg/dL   Optimal  899-870  mg/dL   Near or Above                    Optimal  130-159  mg/dL   Borderline  839-810  mg/dL   High  >809     mg/dL   Very High Performed at [facility], [address] [place], [place] [address]     Blood Alcohol level:  Lab Results  Component Value Date   ETH 290 (H) 10/27/2024   ETH 299 (H) 06/21/2024    Metabolic Disorder Labs:  Lab Results  Component Value Date   HGBA1C 5.5 04/04/2024   MPG 111.15 04/04/2024   MPG 114 08/20/2021   No results found for: PROLACTIN Lab Results  Component Value Date   CHOL 151 10/29/2024   TRIG 130  10/29/2024  HDL 52 10/29/2024   CHOLHDL 2.9 10/29/2024   VLDL 26 10/29/2024   LDLCALC 73 10/29/2024   LDLCALC 83 04/04/2024    Current Medications: Current Facility-Administered Medications  Medication Dose Route Frequency Provider Last Rate Last Admin   acetaminophen  (TYLENOL ) tablet 650 mg  650 mg Oral Q6H PRN Jadapalle, Sree, MD   650 mg at 10/29/24 0803   alum & mag hydroxide-simeth (MAALOX/MYLANTA) 200-200-20 MG/5ML suspension 30 mL  30 mL Oral Q4H PRN Jadapalle, Sree, MD       chlordiazePOXIDE  (LIBRIUM ) capsule 25 mg  25 mg Oral Q6H PRN Valerie Fredin, NP       chlordiazePOXIDE  (LIBRIUM ) capsule 25 mg  25 mg Oral QID Lyriq Jarchow, NP   25 mg at 10/29/24 1218   Followed by   NOREEN ON 10/30/2024] chlordiazePOXIDE  (LIBRIUM ) capsule 25 mg  25 mg Oral TID Jaxn Chiquito, NP       Followed by   NOREEN ON 10/31/2024] chlordiazePOXIDE  (LIBRIUM ) capsule 25 mg  25 mg Oral BH-qamhs Shiv Shuey, NP       Followed by   NOREEN ON 11/02/2024] chlordiazePOXIDE  (LIBRIUM ) capsule 25 mg  25 mg Oral Daily Jovon Winterhalter, NP       diphenhydrAMINE  (BENADRYL ) capsule 25 mg  25 mg Oral Q6H PRN Jadapalle, Sree, MD       haloperidol  (HALDOL ) tablet 5 mg  5 mg Oral TID PRN Jadapalle, Sree, MD       And   diphenhydrAMINE  (BENADRYL ) capsule 50 mg  50 mg Oral TID PRN Jadapalle, Sree, MD       haloperidol  lactate (HALDOL ) injection 5 mg  5 mg Intramuscular TID PRN Jadapalle, Sree, MD       And   diphenhydrAMINE  (BENADRYL ) injection 50 mg  50 mg Intramuscular TID PRN Jadapalle, Sree, MD       And   LORazepam  (ATIVAN ) injection 2 mg  2 mg Intramuscular TID PRN Jadapalle, Sree, MD       haloperidol  lactate (HALDOL ) injection 10 mg  10 mg Intramuscular TID PRN Jadapalle, Sree, MD       And   diphenhydrAMINE  (BENADRYL ) injection 50 mg  50 mg Intramuscular TID PRN Jadapalle, Sree, MD       And   LORazepam  (ATIVAN ) injection 2 mg  2 mg Intramuscular TID PRN Jadapalle, Sree, MD       hydrOXYzine  (ATARAX ) tablet 25 mg  25 mg Oral Q6H  PRN Jadapalle, Sree, MD   25 mg at 10/28/24 2032   Influenza vac split trivalent PF (FLUZONE  HIGH-DOSE) injection 0.5 mL  0.5 mL Intramuscular Tomorrow-1000 Donnelly Mellow, MD       loperamide  (IMODIUM ) capsule 2-4 mg  2-4 mg Oral PRN Beronica Lansdale, NP       magnesium  hydroxide (MILK OF MAGNESIA) suspension 30 mL  30 mL Oral Daily PRN Donnelly Mellow, MD       multivitamin with minerals tablet 1 tablet  1 tablet Oral Daily Marlaine Arey, NP       nicotine  (NICODERM CQ  - dosed in mg/24 hours) patch 21 mg  21 mg Transdermal Daily Jadapalle, Sree, MD   21 mg at 10/29/24 9195   ondansetron  (ZOFRAN -ODT) disintegrating tablet 4 mg  4 mg Oral Q6H PRN Semiyah Newgent, NP       pneumococcal 20-valent conjugate vaccine (PREVNAR 20 ) injection 0.5 mL  0.5 mL Intramuscular Tomorrow-1000 Donnelly Mellow, MD       thiamine  (VITAMIN B1) tablet 100 mg  100 mg Oral  Daily Jadapalle, Sree, MD   100 mg at 10/29/24 0800   Or   thiamine  (VITAMIN B1) injection 100 mg  100 mg Intravenous Daily Jadapalle, Sree, MD       traZODone  (DESYREL ) tablet 50 mg  50 mg Oral QHS PRN Jadapalle, Sree, MD   50 mg at 10/28/24 2032   PTA Medications: Medications Prior to Admission  Medication Sig Dispense Refill Last Dose/Taking   DULoxetine  (CYMBALTA ) 30 MG capsule Take 1 capsule (30 mg total) by mouth 2 (two) times daily. (Patient not taking: Reported on 10/27/2024) 60 capsule 0    mirtazapine  (REMERON ) 7.5 MG tablet Take 1 tablet (7.5 mg total) by mouth at bedtime. (Patient not taking: Reported on 10/27/2024) 30 tablet 0    nicotine  (NICODERM CQ  - DOSED IN MG/24 HOURS) 14 mg/24hr patch Place 1 patch (14 mg total) onto the skin daily. (Patient not taking: Reported on 10/27/2024) 28 patch 0    thiamine  (VITAMIN B-1) 100 MG tablet Take 1 tablet (100 mg total) by mouth daily. (Patient not taking: Reported on 10/27/2024) 30 tablet 0    traZODone  (DESYREL ) 50 MG tablet Take 1 tablet (50 mg total) by mouth at bedtime as needed for sleep. (Patient not  taking: Reported on 10/27/2024) 30 tablet 0     Psychiatric Specialty Exam:  Presentation  General Appearance:  Appropriate for Environment; Casual  Eye Contact: Fair  Speech: Clear and Coherent  Speech Volume: Normal    Mood and Affect  Mood: Euthymic  Affect: Appropriate   Thought Process  Thought Processes: Coherent  Descriptions of Associations:Intact  Orientation:Full (Time, Place and Person)  Thought Content:Logical  Hallucinations:No data recorded Ideas of Reference:None  Suicidal Thoughts:No data recorded Homicidal Thoughts:No data recorded  Sensorium  Memory: Immediate Fair; Recent Fair; Remote Fair  Judgment: Fair  Insight: Fair   Art Therapist  Concentration: Fair  Attention Span: Fair  Recall: [employer] of Knowledge: Fair  Language: Fair   Psychomotor Activity  Psychomotor Activity:No data recorded  Assets  Assets: Communication Skills; Desire for Improvement; Resilience; Social Support    Musculoskeletal: Strength & Muscle Tone: {desc; muscle tone:32375} Gait & Station: {PE GAIT ED WJUO:77474}  Physical Exam: Physical Exam ROS Blood pressure (!) 166/96, pulse 99, temperature 98.3 F (36.8 C), temperature source Oral, resp. rate 18, height 5' 7 (1.702 m), weight 73.5 kg, SpO2 98%. Body mass index is 25.37 kg/m.  Principal Diagnosis: MDD (major depressive disorder), recurrent severe, without psychosis (HCC) Diagnosis:  Principal Problem:   MDD (major depressive disorder), recurrent severe, without psychosis (HCC)   Clinical Decision Making:  Treatment Plan Summary:  Safety and Monitoring:             -- Voluntary admission to inpatient psychiatric unit for safety, stabilization and treatment             -- Daily contact with patient to assess and evaluate symptoms and progress in treatment             -- Patient's case to be discussed in multi-disciplinary team meeting             -- Observation  Level: q15 minute checks             -- Vital signs:  q12 hours             -- Precautions: suicide, elopement, and assault   2. Psychiatric Diagnoses and Treatment:                   --  The risks/benefits/side-effects/alternatives to this medication were discussed in detail with the patient and time was given for questions. The patient consents to medication trial.                -- Metabolic profile and EKG monitoring obtained while on an atypical antipsychotic (BMI: Lipid Panel: HbgA1c: QTc:)              -- Encouraged patient to participate in unit milieu and in scheduled group therapies                            3. Medical Issues Being Addressed:      4. Discharge Planning:              -- Social work and case management to assist with discharge planning and identification of hospital follow-up needs prior to discharge             -- Estimated LOS: 5-7 days             -- Discharge Concerns: Need to establish a safety plan; Medication compliance and effectiveness             -- Discharge Goals: Return home with outpatient referrals follow ups  Physician Treatment Plan for Primary Diagnosis: MDD (major depressive disorder), recurrent severe, without psychosis (HCC) Long Term Goal(s): {BHH MD Tx Plan Long Term Goals:30414007::Improvement in symptoms so as ready for discharge}  Short Term Goals: [facility] MD Tx Plan Short Term Hnjod:69585996}  Physician Treatment Plan for Secondary Diagnosis: Principal Problem:   MDD (major depressive disorder), recurrent severe, without psychosis (HCC)  Long Term Goal(s): {BHH MD Tx Plan Long Term Goals:30414007::Improvement in symptoms so as ready for discharge}  Short Term Goals: [facility] MD Tx Plan Short Term Hnjod:69585996}  I certify that inpatient services furnished can reasonably be expected to improve the patient's condition.    Markee Remlinger, NP 1/31/202612:26 PM     [1] No Known Allergies  "

## 2024-10-29 NOTE — Group Note (Unsigned)
 Date:  10/30/2024 Time:  2:04 AM  Group Topic/Focus:  Recovery Goals:   The focus of this group is to identify appropriate goals for recovery and establish a plan to achieve them. Wrap-Up Group:   The focus of this group is to help patients review their daily goal of treatment and discuss progress on daily workbooks.    Participation Level:  Active  Participation Quality:  Appropriate and Attentive  Affect:  Appropriate  Cognitive:  Alert, Appropriate, and Oriented  Insight: Appropriate and Good  Engagement in Group:  Engaged  Modes of Intervention:  Discussion and Support  Additional Comments:  N/A  Randall Harvey 10/30/2024, 2:04 AM

## 2024-10-29 NOTE — Progress Notes (Signed)
" °   10/29/24 0800  Psych Admission Type (Psych Patients Only)  Admission Status Involuntary  Psychosocial Assessment  Patient Complaints Anxiety  Eye Contact Fair  Facial Expression Sad  Affect Sad  Speech Soft  Interaction Assertive  Motor Activity Slow  Appearance/Hygiene In scrubs;Disheveled  Behavior Characteristics Cooperative  Mood Sad  Thought Administrator, Sports thinking  Content Preoccupation  Delusions None reported or observed  Perception WDL  Hallucination None reported or observed  Judgment Impaired  Confusion None  Danger to Self  Current suicidal ideation? Denies  Danger to Others  Danger to Others None reported or observed    "

## 2024-10-30 NOTE — Group Note (Signed)
 Date:  10/30/2024 Time:  8:58 PM  Group Topic/Focus:  Managing Feelings:   The focus of this group is to identify what feelings patients have difficulty handling and develop a plan to handle them in a healthier way upon discharge. Self Care:   The focus of this group is to help patients understand the importance of self-care in order to improve or restore emotional, physical, spiritual, interpersonal, and financial health. Wrap-Up Group:   The focus of this group is to help patients review their daily goal of treatment and discuss progress on daily workbooks.    Participation Level:  Minimal  Participation Quality:  Appropriate  Affect:  Appropriate  Cognitive:  Appropriate and Oriented  Insight: Appropriate and Limited  Engagement in Group:  Limited  Modes of Intervention:  Discussion and Support  Additional Comments:  N/A  Butler LITTIE Gelineau 10/30/2024, 8:58 PM

## 2024-10-30 NOTE — Progress Notes (Signed)
 [facility] MD Progress Note  10/30/2024 8:07 PM Randall Harvey.  MRN:  [id number]   Subjective:  Chart reviewed, case discussed in multidisciplinary meeting, patient seen during rounds.   2/1: Writer met with patient in his room during rounds. He reports he is doing good with no complaints or concerns. We discuss the lexapro  and he denies any side effects. He continues to be on the librium  taper as well. Patient continues to express desire for medication that takes care of his depression right now and alludes to wanting a fast acting medication. Discussed first line medications and the therapeutic time frame. Patient denies SI, HI, and AVH. He becomes intermittently tearful when discussing his girlfriend again, requiring redirection from her back to himself and his current presentation. He endorses desire to quit drinking, but does not want rehab or substance abuse treatment. He continues to have poor insight into reasons for presentation and impact of ongoing alcohol abuse.   Past Psychiatric History: see h&P Family History:  Family History  Problem Relation Age of Onset   Pancreatic cancer Mother    Colon cancer Father    Diabetes Father    Colon cancer Sister    COPD Sister    Seizures Sister    Ovarian cancer Sister    Cancer Paternal Grandmother        bone   Stroke Paternal Grandfather    Heart disease Paternal Grandfather    Hypertension Neg Hx    Social History:  Social History   Substance and Sexual Activity  Alcohol Use Yes   Alcohol/week: 12.0 standard drinks of alcohol   Types: 12 Cans of beer per week   Comment: alcohol abuse     Social History   Substance and Sexual Activity  Drug Use Not Currently   Types: Marijuana   Comment: occ    Social History   Socioeconomic History   Marital status: Divorced    Spouse name: Not on file   Number of children: Not on file   Years of education: Not on file   Highest education level: Not on file  Occupational History    Not on file  Tobacco Use   Smoking status: Every Day    Current packs/day: 0.50    Average packs/day: 0.5 packs/day for 56.1 years (28.0 ttl pk-yrs)    Types: Cigarettes    Start date: 1970   Smokeless tobacco: Never  Vaping Use   Vaping status: Never Used  Substance and Sexual Activity   Alcohol use: Yes    Alcohol/week: 12.0 standard drinks of alcohol    Types: 12 Cans of beer per week    Comment: alcohol abuse   Drug use: Not Currently    Types: Marijuana    Comment: occ   Sexual activity: Yes  Other Topics Concern   Not on file  Social History Narrative   ** Merged History Encounter **       Social Drivers of Health   Tobacco Use: High Risk (10/28/2024)   Patient History    Smoking Tobacco Use: Every Day    Smokeless Tobacco Use: Never    Passive Exposure: Not on file  Financial Resource Strain: Not on file  Food Insecurity: Food Insecurity Present (10/28/2024)   Epic    Worried About Programme Researcher, Broadcasting/film/video in the Last Year: Sometimes true    Ran Out of Food in the Last Year: Sometimes true  Transportation Needs: Unmet Transportation Needs (10/28/2024)   Epic  Lack of Transportation (Medical): Yes    Lack of Transportation (Non-Medical): Yes  Physical Activity: Not on file  Stress: Not on file  Social Connections: Patient Declined (10/28/2024)   Social Connection and Isolation Panel    Frequency of Communication with Friends and Family: Patient declined    Frequency of Social Gatherings with Friends and Family: Patient declined    Attends Religious Services: Patient declined    Active Member of Clubs or Organizations: Patient declined    Attends Banker Meetings: Patient declined    Marital Status: Patient declined  Depression (PHQ2-9): Not on file  Alcohol Screen: Medium Risk (10/28/2024)   Alcohol Screen    Last Alcohol Screening Score (AUDIT): 9  Housing: Low Risk (10/28/2024)   Epic    Unable to Pay for Housing in the Last Year: No    Number of  Times Moved in the Last Year: 0    Homeless in the Last Year: No  Utilities: Not At Risk (10/28/2024)   Epic    Threatened with loss of utilities: No  Health Literacy: Not on file   Past Medical History:  Past Medical History:  Diagnosis Date   Alcohol intoxication    Anticoagulant long-term use    lifetime use, coumadin therapy, then Xarelto    Anxiety    Arthritis    Cancer (HCC)    renal   Carotid atherosclerosis    <50% left and right   Clotting disorder    COPD (chronic obstructive pulmonary disease) (HCC)    Depression    Elevated liver enzymes 11/2013   Family history of cancer    Fatty liver    H/O drug abuse (HCC)    H/O ETOH abuse    Hepatitis    History of traumatic brain injury    Hypertension    Lymphadenopathy    Obesity    Personal history of DVT (deep vein thrombosis) 06/2013   Pre-diabetes    Primary osteoarthritis of left knee    Pulmonary embolism and infarction (HCC) 06/2013   Pulmonary embolism and infarction (HCC) 11/2013   Pulmonary embolus with infarction [facility])    Renal cell carcinoma (HCC)    Renal mass, left 06/2013   with ureteral obstruction   Stroke (HCC)    Subarachnoid hemorrhage following injury (HCC)    Thrombocythemia    Thrombocytopenia    Tobacco abuse     Past Surgical History:  Procedure Laterality Date   ANKLE FRACTURE SURGERY Right 12/2012   ANKLE SURGERY     EXCISION METACARPAL MASS Right 09/18/2016   Procedure: EXCISION METACARPAL MASS;  Surgeon: Ozell Flake, MD;  Location: [facility] ORS;  Service: Orthopedics;  Laterality: Right;  5th digit    HERNIA REPAIR     Umbilical Hernia   ORBITAL FRACTURE SURGERY Left    ROBOTIC ASSITED PARTIAL NEPHRECTOMY Left Oct 2014    Current Medications: Current Facility-Administered Medications  Medication Dose Route Frequency Provider Last Rate Last Admin   acetaminophen  (TYLENOL ) tablet 650 mg  650 mg Oral Q6H PRN Jadapalle, Sree, MD   650 mg at 10/29/24 0803   alum & mag  hydroxide-simeth (MAALOX/MYLANTA) 200-200-20 MG/5ML suspension 30 mL  30 mL Oral Q4H PRN Jadapalle, Sree, MD       chlordiazePOXIDE  (LIBRIUM ) capsule 25 mg  25 mg Oral Q6H PRN Florentine Diekman, NP       chlordiazePOXIDE  (LIBRIUM ) capsule 25 mg  25 mg Oral TID Star Cheese, NP   25 mg at 10/30/24  1140   Followed by   NOREEN ON 10/31/2024] chlordiazePOXIDE  (LIBRIUM ) capsule 25 mg  25 mg Oral BH-qamhs Errol Ala, NP       Followed by   NOREEN ON 11/02/2024] chlordiazePOXIDE  (LIBRIUM ) capsule 25 mg  25 mg Oral Daily Avree Szczygiel, NP       diphenhydrAMINE  (BENADRYL ) capsule 25 mg  25 mg Oral Q6H PRN Jadapalle, Sree, MD       haloperidol  (HALDOL ) tablet 5 mg  5 mg Oral TID PRN Jadapalle, Sree, MD   5 mg at 10/30/24 1320   And   diphenhydrAMINE  (BENADRYL ) capsule 50 mg  50 mg Oral TID PRN Jadapalle, Sree, MD   50 mg at 10/30/24 1320   haloperidol  lactate (HALDOL ) injection 5 mg  5 mg Intramuscular TID PRN Jadapalle, Sree, MD       And   diphenhydrAMINE  (BENADRYL ) injection 50 mg  50 mg Intramuscular TID PRN Jadapalle, Sree, MD       And   LORazepam  (ATIVAN ) injection 2 mg  2 mg Intramuscular TID PRN Jadapalle, Sree, MD       haloperidol  lactate (HALDOL ) injection 10 mg  10 mg Intramuscular TID PRN Jadapalle, Sree, MD       And   diphenhydrAMINE  (BENADRYL ) injection 50 mg  50 mg Intramuscular TID PRN Jadapalle, Sree, MD       And   LORazepam  (ATIVAN ) injection 2 mg  2 mg Intramuscular TID PRN Jadapalle, Sree, MD       escitalopram  (LEXAPRO ) tablet 5 mg  5 mg Oral Daily Persia Lintner, NP   5 mg at 10/30/24 9097   hydrOXYzine  (ATARAX ) tablet 25 mg  25 mg Oral Q6H PRN Jadapalle, Sree, MD   25 mg at 10/28/24 2032   Influenza vac split trivalent PF (FLUZONE  HIGH-DOSE) injection 0.5 mL  0.5 mL Intramuscular Tomorrow-1000 Donnelly Mellow, MD       loperamide  (IMODIUM ) capsule 2-4 mg  2-4 mg Oral PRN Dwyne Hasegawa, NP       magnesium  hydroxide (MILK OF MAGNESIA) suspension 30 mL  30 mL Oral Daily PRN Donnelly Mellow, MD        multivitamin with minerals tablet 1 tablet  1 tablet Oral Daily Armenta Erskin, NP   1 tablet at 10/30/24 9097   nicotine  (NICODERM CQ  - dosed in mg/24 hours) patch 21 mg  21 mg Transdermal Daily Jadapalle, Sree, MD   21 mg at 10/30/24 9095   ondansetron  (ZOFRAN -ODT) disintegrating tablet 4 mg  4 mg Oral Q6H PRN Mendel Binsfeld, NP       pneumococcal 20-valent conjugate vaccine (PREVNAR 20 ) injection 0.5 mL  0.5 mL Intramuscular Tomorrow-1000 Donnelly Mellow, MD       thiamine  (VITAMIN B1) tablet 100 mg  100 mg Oral Daily Jadapalle, Sree, MD   100 mg at 10/30/24 9096   Or   thiamine  (VITAMIN B1) injection 100 mg  100 mg Intravenous Daily Jadapalle, Sree, MD       traZODone  (DESYREL ) tablet 50 mg  50 mg Oral QHS PRN Jadapalle, Sree, MD   50 mg at 10/29/24 2133    Lab Results:  Results for orders placed or performed during the hospital encounter of 10/28/24 (from the past 48 hours)  Hemoglobin A1c     Status: None   Collection Time: 10/29/24  9:45 AM  Result Value Ref Range   Hgb A1c MFr Bld 5.5 4.8 - 5.6 %    Comment: (NOTE) Diagnosis of Diabetes The following HbA1c ranges  recommended by the American Diabetes Association (ADA) Elleanor Guyett be used as an aid in the diagnosis of diabetes mellitus.  Hemoglobin             Suggested A1C NGSP%              Diagnosis  <5.7                   Non Diabetic  5.7-6.4                Pre-Diabetic  >6.4                   Diabetic  <7.0                   Glycemic control for                       adults with diabetes.     Mean Plasma Glucose 111.15 mg/dL    Comment: Performed at [facility], [address] [place], [place] [address]  Lipid panel     Status: None   Collection Time: 10/29/24  9:45 AM  Result Value Ref Range   Cholesterol 151 0 - 200 mg/dL    Comment:        ATP III CLASSIFICATION:  <200     mg/dL   Desirable  799-760  mg/dL   Borderline High  >=759    mg/dL   High           Triglycerides 130 <150 mg/dL   HDL 52 >59 mg/dL    Total CHOL/HDL Ratio 2.9 RATIO   VLDL 26 0 - 40 mg/dL   LDL Cholesterol 73 0 - 99 mg/dL    Comment:        Total Cholesterol/HDL:CHD Risk Coronary Heart Disease Risk Table                     Men   Women  1/2 Average Risk   3.4   3.3  Average Risk       5.0   4.4  2 X Average Risk   9.6   7.1  3 X Average Risk  23.4   11.0        Use the calculated Patient Ratio above and the CHD Risk Table to determine the patient's CHD Risk.        ATP III CLASSIFICATION (LDL):  <100     mg/dL   Optimal  899-870  mg/dL   Near or Above                    Optimal  130-159  mg/dL   Borderline  839-810  mg/dL   High  >809     mg/dL   Very High Performed at [facility], [address] [place], [place] [address]     Blood Alcohol level:  Lab Results  Component Value Date   ETH 290 (H) 10/27/2024   ETH 299 (H) 06/21/2024    Metabolic Disorder Labs: Lab Results  Component Value Date   HGBA1C 5.5 10/29/2024   MPG 111.15 10/29/2024   MPG 111.15 04/04/2024   No results found for: PROLACTIN Lab Results  Component Value Date   CHOL 151 10/29/2024   TRIG 130 10/29/2024   HDL 52 10/29/2024   CHOLHDL 2.9 10/29/2024   VLDL 26 10/29/2024   LDLCALC 73 10/29/2024   LDLCALC 83 04/04/2024  Physical Findings: AIMS:  , ,  ,  ,    CIWA:  CIWA-Ar Total: 0 COWS:      Psychiatric Specialty Exam:  Presentation  General Appearance:  Appropriate for Environment; Casual  Eye Contact: Fair  Speech: Clear and Coherent  Speech Volume: Normal    Mood and Affect  Mood: Labile  Affect: Labile   Thought Process  Thought Processes: Coherent  Orientation:Full (Time, Place and Person)  Thought Content:Logical  Hallucinations:Hallucinations: None  Ideas of Reference:None  Suicidal Thoughts:Suicidal Thoughts: No  Homicidal Thoughts:Homicidal Thoughts: No   Sensorium  Memory: Immediate Fair; Recent Fair; Remote  Fair  Judgment: Poor  Insight: Poor   Executive Functions  Concentration: Fair  Attention Span: Fair  Recall: Fair  Fund of Knowledge: Fair  Language: Fair   Psychomotor Activity  Psychomotor Activity: Psychomotor Activity: Normal  Musculoskeletal: Strength & Muscle Tone: within normal limits Gait & Station: Uses walker Assets  Assets: Communication Skills; Desire for Improvement    Physical Exam: Physical Exam Vitals and nursing note reviewed.  Constitutional:      Appearance: Normal appearance.  Pulmonary:     Effort: Pulmonary effort is normal.  Neurological:     Mental Status: He is alert and oriented to person, place, and time.  Psychiatric:        Behavior: Behavior normal.    Review of Systems  Respiratory:  Negative for shortness of breath.   Cardiovascular:  Negative for chest pain.  Gastrointestinal:  Negative for diarrhea, nausea and vomiting.  Psychiatric/Behavioral:  Positive for depression and substance abuse. Negative for hallucinations and suicidal ideas. The patient is nervous/anxious.   All other systems reviewed and are negative.  Blood pressure 126/75, pulse 89, temperature 98.2 F (36.8 C), temperature source Oral, resp. rate 18, height 5' 7 (1.702 m), weight 73.5 kg, SpO2 98%. Body mass index is 25.37 kg/m.  Diagnosis: Principal Problem:   MDD (major depressive disorder), recurrent severe, without psychosis (HCC)   PLAN: Safety and Monitoring:  -- Involuntary admission to inpatient psychiatric unit for safety, stabilization and treatment  -- Daily contact with patient to assess and evaluate symptoms and progress in treatment  -- Patient's case to be discussed in multi-disciplinary team meeting  -- Observation Level : q15 minute checks  -- Vital signs:  q12 hours  -- Precautions: suicide, elopement, and assault -- Encouraged patient to participate in unit milieu and in scheduled group therapies   2. Psychiatric  Treatment:  Scheduled Medications: Lexapro  5 mg daily NRT   -- The risks/benefits/side-effects/alternatives to this medication were discussed in detail with the patient and time was given for questions. The patient consents to medication trial.  3. Medical Issues Being Addressed:  Librium  protocol - detox    4. Discharge Planning:   -- Social work and case management to assist with discharge planning and identification of hospital follow-up needs prior to discharge  -- Estimated LOS: 3-4 days  Lequita Meadowcroft, NP 10/30/2024, 8:07 PM

## 2024-10-30 NOTE — Plan of Care (Signed)
  Problem: Education: Goal: Knowledge of [facility] Education information/materials will improve Outcome: Progressing Goal: Emotional status will improve Outcome: Progressing Goal: Mental status will improve Outcome: Progressing Goal: Verbalization of understanding the information provided will improve Outcome: Progressing   Problem: Activity: Goal: Interest or engagement in activities will improve Outcome: Progressing Goal: Sleeping patterns will improve Outcome: Progressing   Problem: Coping: Goal: Ability to verbalize frustrations and anger appropriately will improve Outcome: Progressing Goal: Ability to demonstrate self-control will improve Outcome: Progressing   Problem: Health Behavior/Discharge Planning: Goal: Identification of resources available to assist in meeting health care needs will improve Outcome: Progressing Goal: Compliance with treatment plan for underlying cause of condition will improve Outcome: Progressing   Problem: Physical Regulation: Goal: Ability to maintain clinical measurements within normal limits will improve Outcome: Progressing   Problem: Safety: Goal: Periods of time without injury will increase Outcome: Progressing   Problem: Education: Goal: Utilization of techniques to improve thought processes will improve Outcome: Progressing Goal: Knowledge of the prescribed therapeutic regimen will improve Outcome: Progressing   Problem: Activity: Goal: Interest or engagement in leisure activities will improve Outcome: Progressing Goal: Imbalance in normal sleep/wake cycle will improve Outcome: Progressing   Problem: Coping: Goal: Coping ability will improve Outcome: Progressing Goal: Will verbalize feelings Outcome: Progressing   Problem: Health Behavior/Discharge Planning: Goal: Ability to make decisions will improve Outcome: Progressing Goal: Compliance with therapeutic regimen will improve Outcome: Progressing    Problem: Role Relationship: Goal: Will demonstrate positive changes in social behaviors and relationships Outcome: Progressing   Problem: Safety: Goal: Ability to disclose and discuss suicidal ideas will improve Outcome: Progressing Goal: Ability to identify and utilize support systems that promote safety will improve Outcome: Progressing   Problem: Self-Concept: Goal: Will verbalize positive feelings about self Outcome: Progressing Goal: Level of anxiety will decrease Outcome: Progressing   Problem: Education: Goal: Ability to make informed decisions regarding treatment will improve Outcome: Progressing   Problem: Coping: Goal: Coping ability will improve Outcome: Progressing   Problem: Health Behavior/Discharge Planning: Goal: Identification of resources available to assist in meeting health care needs will improve Outcome: Progressing   Problem: Medication: Goal: Compliance with prescribed medication regimen will improve Outcome: Progressing   Problem: Self-Concept: Goal: Ability to disclose and discuss suicidal ideas will improve Outcome: Progressing   Problem: Education: Goal: Ability to state activities that reduce stress will improve Outcome: Progressing   Problem: Coping: Goal: Ability to identify and develop effective coping behavior will improve Outcome: Progressing   Problem: Self-Concept: Goal: Ability to identify factors that promote anxiety will improve Outcome: Progressing Goal: Level of anxiety will decrease Outcome: Progressing Goal: Ability to modify response to factors that promote anxiety will improve Outcome: Progressing

## 2024-10-30 NOTE — Plan of Care (Signed)
   Problem: Education: Goal: Knowledge of [facility] Education information/materials will improve Outcome: Progressing   Problem: Education: Goal: Emotional status will improve Outcome: Progressing   Problem: Education: Goal: Mental status will improve Outcome: Progressing   Problem: Education: Goal: Verbalization of understanding the information provided will improve Outcome: Progressing
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
## 2024-10-30 NOTE — Group Note (Signed)
 Date:  10/30/2024 Time:  7:16 PM  Group Topic/Focus:  Coping With Mental Health Crisis:   The purpose of this group is to help patients identify strategies for coping with mental health crisis.  Group discusses possible causes of crisis and ways to manage them effectively. Healthy Communication:   The focus of this group is to discuss communication, barriers to communication, as well as healthy ways to communicate with others.  A group session was conducted with patients utilizing the activity Two Truths and One Lie to promote peer interaction and allow both patients and facilitator to become more familiar with one another. Following this activity, patients participated in an arts and crafts intervention involving the creation of viral nursing glove dolls. Patients demonstrated enjoyment and engagement throughout the activity. This intervention provided an opportunity for patients to use a simple and accessible coping mechanism to redirect focus and promote mindfulness. Materials used were minimal, including one glove, a mask, and a marker. Patients appeared excited during the process and expressed a sense of accomplishment upon completing the activity.  Participation Level:  Active  Participation Quality:  Appropriate  Affect:  Appropriate  Cognitive:  Appropriate  Insight: Appropriate  Engagement in Group:  None  Modes of Intervention:  Activity and Discussion  Additional Comments:    Randall Harvey 10/30/2024, 7:16 PM

## 2024-10-30 NOTE — Progress Notes (Signed)
" °   10/29/24 2000  Psych Admission Type (Psych Patients Only)  Admission Status Involuntary  Psychosocial Assessment  Patient Complaints Anxiety  Eye Contact Fair  Facial Expression Anxious  Affect Apprehensive  Speech Logical/coherent  Interaction Assertive  Motor Activity Slow  Appearance/Hygiene Improved  Behavior Characteristics Cooperative;Appropriate to situation  Mood Anxious  Thought Process  Coherency WDL  Content Preoccupation  Delusions None reported or observed  Perception WDL  Hallucination None reported or observed  Judgment Impaired  Confusion None  Danger to Self  Current suicidal ideation? Denies  Danger to Others  Danger to Others None reported or observed   Patient alert and oriented x 4, affect is flat but brightens upon approach, he denies SI/HI/AVH, thoughts are  organized and coherent, he's interacting appropriately with peers and staff.  "

## 2024-10-30 NOTE — Progress Notes (Signed)
" °   10/30/24 1320  Complaints & Interventions  Complains of Anxiety;Other (Comment) (depression and crying spells)  Interventions Medication (see MAR)   Patient came into the medication room asking specifically for Ativan . This clinical research associate asked patient why he needs Ativan  and he stated I have so much pain in my head and my heart, and I just can't take it anymore. This clinical research associate offered patient PRN Haldol  and Benadryl  to help relax/calm him down. Patient tolerated medication administration well, without any issues. Patient remains safe on the unit and will continue to be monitored for safety. "

## 2024-10-30 NOTE — Progress Notes (Signed)
" °   10/30/24 1700  Psych Admission Type (Psych Patients Only)  Admission Status Involuntary  Psychosocial Assessment  Patient Complaints Anxiety;Depression  Eye Contact Fair  Facial Expression Anxious  Affect Apprehensive  Speech Logical/coherent  Interaction Assertive  Motor Activity Slow  Appearance/Hygiene In scrubs  Behavior Characteristics Cooperative;Appropriate to situation  Mood Anxious  Thought Process  Coherency WDL  Content Preoccupation  Delusions None reported or observed  Perception WDL  Hallucination None reported or observed  Judgment Impaired  Confusion None  Danger to Self  Current suicidal ideation? Denies  Danger to Others  Danger to Others None reported or observed    "

## 2024-10-31 MED ORDER — ESCITALOPRAM OXALATE 10 MG PO TABS
10.0000 mg | ORAL_TABLET | Freq: Every day | ORAL | Status: DC
  Administered 2024-11-01 – 2024-11-03 (×3): 10 mg via ORAL
  Filled 2024-10-31 (×3): qty 1

## 2024-10-31 NOTE — Group Note (Signed)
 Recreation Therapy Group Note   Group Topic:General Recreation  Group Date: 10/31/2024 Start Time: 1530 End Time: 1620 Facilitators: Celestia Jeoffrey FORBES ARTICE, CTRS Location: Craft Room  Group Description: Bingo. Patients played multiple rounds of bingo. LRT and pts discussed the definition of leisure, things they do in their free time outside of the hospital, and how bingo is also a leisure activity. Pts received a coloring book, word search book, or journal as a prize.    Goal Area(s) Addressed:  Patient will identify a current leisure interest.  Patient will learn the definition of leisure. Patient will have the opportunity to try a new leisure activity. Patient will communicate with peers and LRT.   Affect/Mood: Appropriate and Blunted   Participation Level: Active and Engaged   Participation Quality: Independent   Behavior: Calm and Cooperative   Speech/Thought Process: Coherent   Insight: Fair   Judgement: Fair    Modes of Intervention: Competitive Play, Music, and Socialization   Patient Response to Interventions:  Engaged and Receptive   Education Outcome:  In group clarification offered    Clinical Observations/Individualized Feedback: Randall Harvey was active in their participation of session activities and group discussion. Pt interacted well with LRT and peers duration of session.    Plan: Continue to engage patient in RT group sessions 2-3x/week.   Jeoffrey FORBES Celestia, LRT, CTRS 10/31/2024 5:15 PM

## 2024-10-31 NOTE — BH IP Treatment Plan (Signed)
 Interdisciplinary Treatment and Diagnostic Plan Update  10/31/2024 Time of Session: 10:00AM Randall Harvey. MRN: [id number]  Principal Diagnosis: MDD (major depressive disorder), recurrent severe, without psychosis (HCC)  Secondary Diagnoses: Principal Problem:   MDD (major depressive disorder), recurrent severe, without psychosis (HCC)   Current Medications:  Current Facility-Administered Medications  Medication Dose Route Frequency Provider Last Rate Last Admin   acetaminophen  (TYLENOL ) tablet 650 mg  650 mg Oral Q6H PRN Jadapalle, Sree, MD   650 mg at 10/29/24 0803   alum & mag hydroxide-simeth (MAALOX/MYLANTA) 200-200-20 MG/5ML suspension 30 mL  30 mL Oral Q4H PRN Jadapalle, Sree, MD       chlordiazePOXIDE  (LIBRIUM ) capsule 25 mg  25 mg Oral Q6H PRN May, Tanya, NP       chlordiazePOXIDE  (LIBRIUM ) capsule 25 mg  25 mg Oral BH-qamhs May, Tanya, NP       Followed by   NOREEN ON 11/02/2024] chlordiazePOXIDE  (LIBRIUM ) capsule 25 mg  25 mg Oral Daily May, Tanya, NP       diphenhydrAMINE  (BENADRYL ) capsule 25 mg  25 mg Oral Q6H PRN Jadapalle, Sree, MD       haloperidol  (HALDOL ) tablet 5 mg  5 mg Oral TID PRN Jadapalle, Sree, MD   5 mg at 10/30/24 1320   And   diphenhydrAMINE  (BENADRYL ) capsule 50 mg  50 mg Oral TID PRN Jadapalle, Sree, MD   50 mg at 10/30/24 1320   haloperidol  lactate (HALDOL ) injection 5 mg  5 mg Intramuscular TID PRN Jadapalle, Sree, MD       And   diphenhydrAMINE  (BENADRYL ) injection 50 mg  50 mg Intramuscular TID PRN Jadapalle, Sree, MD       And   LORazepam  (ATIVAN ) injection 2 mg  2 mg Intramuscular TID PRN Jadapalle, Sree, MD       haloperidol  lactate (HALDOL ) injection 10 mg  10 mg Intramuscular TID PRN Jadapalle, Sree, MD       And   diphenhydrAMINE  (BENADRYL ) injection 50 mg  50 mg Intramuscular TID PRN Jadapalle, Sree, MD       And   LORazepam  (ATIVAN ) injection 2 mg  2 mg Intramuscular TID PRN Jadapalle, Sree, MD       escitalopram  (LEXAPRO ) tablet 5 mg  5 mg  Oral Daily May, Tanya, NP   5 mg at 10/31/24 9171   hydrOXYzine  (ATARAX ) tablet 25 mg  25 mg Oral Q6H PRN Jadapalle, Sree, MD   25 mg at 10/28/24 2032   Influenza vac split trivalent PF (FLUZONE  HIGH-DOSE) injection 0.5 mL  0.5 mL Intramuscular Tomorrow-1000 Donnelly Mellow, MD       loperamide  (IMODIUM ) capsule 2-4 mg  2-4 mg Oral PRN May, Tanya, NP       magnesium  hydroxide (MILK OF MAGNESIA) suspension 30 mL  30 mL Oral Daily PRN Donnelly Mellow, MD       multivitamin with minerals tablet 1 tablet  1 tablet Oral Daily May, Tanya, NP   1 tablet at 10/31/24 9171   nicotine  (NICODERM CQ  - dosed in mg/24 hours) patch 21 mg  21 mg Transdermal Daily Jadapalle, Sree, MD   21 mg at 10/31/24 9171   ondansetron  (ZOFRAN -ODT) disintegrating tablet 4 mg  4 mg Oral Q6H PRN May, Tanya, NP       pneumococcal 20-valent conjugate vaccine (PREVNAR 20 ) injection 0.5 mL  0.5 mL Intramuscular Tomorrow-1000 Donnelly Mellow, MD       thiamine  (VITAMIN B1) tablet 100 mg  100 mg Oral Daily  Jadapalle, Sree, MD   100 mg at 10/31/24 9171   Or   thiamine  (VITAMIN B1) injection 100 mg  100 mg Intravenous Daily Jadapalle, Sree, MD       traZODone  (DESYREL ) tablet 50 mg  50 mg Oral QHS PRN Jadapalle, Sree, MD   50 mg at 10/30/24 2121   PTA Medications: Medications Prior to Admission  Medication Sig Dispense Refill Last Dose/Taking   DULoxetine  (CYMBALTA ) 30 MG capsule Take 1 capsule (30 mg total) by mouth 2 (two) times daily. (Patient not taking: Reported on 10/27/2024) 60 capsule 0    mirtazapine  (REMERON ) 7.5 MG tablet Take 1 tablet (7.5 mg total) by mouth at bedtime. (Patient not taking: Reported on 10/27/2024) 30 tablet 0    nicotine  (NICODERM CQ  - DOSED IN MG/24 HOURS) 14 mg/24hr patch Place 1 patch (14 mg total) onto the skin daily. (Patient not taking: Reported on 10/27/2024) 28 patch 0    thiamine  (VITAMIN B-1) 100 MG tablet Take 1 tablet (100 mg total) by mouth daily. (Patient not taking: Reported on 10/27/2024) 30  tablet 0    traZODone  (DESYREL ) 50 MG tablet Take 1 tablet (50 mg total) by mouth at bedtime as needed for sleep. (Patient not taking: Reported on 10/27/2024) 30 tablet 0     Patient Stressors:    Patient Strengths:    Treatment Modalities: Medication Management, Group therapy, Case management,  1 to 1 session with clinician, Psychoeducation, Recreational therapy.   Physician Treatment Plan for Primary Diagnosis: MDD (major depressive disorder), recurrent severe, without psychosis (HCC) Long Term Goal(s): Improvement in symptoms so as ready for discharge   Short Term Goals: Ability to identify changes in lifestyle to reduce recurrence of condition will improve Ability to verbalize feelings will improve Ability to demonstrate self-control will improve Ability to identify and develop effective coping behaviors will improve Ability to maintain clinical measurements within normal limits will improve Compliance with prescribed medications will improve Ability to identify triggers associated with substance abuse/mental health issues will improve  Medication Management: Evaluate patient's response, side effects, and tolerance of medication regimen.  Therapeutic Interventions: 1 to 1 sessions, Unit Group sessions and Medication administration.  Evaluation of Outcomes: Progressing  Physician Treatment Plan for Secondary Diagnosis: Principal Problem:   MDD (major depressive disorder), recurrent severe, without psychosis (HCC)  Long Term Goal(s): Improvement in symptoms so as ready for discharge   Short Term Goals: Ability to identify changes in lifestyle to reduce recurrence of condition will improve Ability to verbalize feelings will improve Ability to demonstrate self-control will improve Ability to identify and develop effective coping behaviors will improve Ability to maintain clinical measurements within normal limits will improve Compliance with prescribed medications will  improve Ability to identify triggers associated with substance abuse/mental health issues will improve     Medication Management: Evaluate patient's response, side effects, and tolerance of medication regimen.  Therapeutic Interventions: 1 to 1 sessions, Unit Group sessions and Medication administration.  Evaluation of Outcomes: Progressing   RN Treatment Plan for Primary Diagnosis: MDD (major depressive disorder), recurrent severe, without psychosis (HCC) Long Term Goal(s): Knowledge of disease and therapeutic regimen to maintain health will improve  Short Term Goals: Ability to demonstrate self-control, Ability to participate in decision making will improve, Ability to verbalize feelings will improve, Ability to disclose and discuss suicidal ideas, Ability to identify and develop effective coping behaviors will improve, and Compliance with prescribed medications will improve  Medication Management: RN will administer medications as ordered by provider,  will assess and evaluate patient's response and provide education to patient for prescribed medication. RN will report any adverse and/or side effects to prescribing provider.  Therapeutic Interventions: 1 on 1 counseling sessions, Psychoeducation, Medication administration, Evaluate responses to treatment, Monitor vital signs and CBGs as ordered, Perform/monitor CIWA, COWS, AIMS and Fall Risk screenings as ordered, Perform wound care treatments as ordered.  Evaluation of Outcomes: Progressing   LCSW Treatment Plan for Primary Diagnosis: MDD (major depressive disorder), recurrent severe, without psychosis (HCC) Long Term Goal(s): Safe transition to appropriate next level of care at discharge, Engage patient in therapeutic group addressing interpersonal concerns.  Short Term Goals: Engage patient in aftercare planning with referrals and resources, Increase social support, Increase ability to appropriately verbalize feelings, Increase  emotional regulation, Facilitate acceptance of mental health diagnosis and concerns, Facilitate patient progression through stages of change regarding substance use diagnoses and concerns, Identify triggers associated with mental health/substance abuse issues, and Increase skills for wellness and recovery  Therapeutic Interventions: Assess for all discharge needs, 1 to 1 time with Social worker, Explore available resources and support systems, Assess for adequacy in community support network, Educate family and significant other(s) on suicide prevention, Complete Psychosocial Assessment, Interpersonal group therapy.  Evaluation of Outcomes: Progressing   Progress in Treatment: Attending groups: No. Participating in groups: No. Taking medication as prescribed: Yes. Toleration medication: Yes. Family/Significant other contact made: No, will contact:  once permission has been granted Patient understands diagnosis: Yes. Discussing patient identified problems/goals with staff: Yes. Medical problems stabilized or resolved: Yes. Denies suicidal/homicidal ideation: Yes. Issues/concerns per patient self-inventory: No. Other: none  New problem(s) identified: No, Describe:  none  New Short Term/Long Term Goal(s): detox, elimination of symptoms of psychosis, medication management for mood stabilization; elimination of SI thoughts; development of comprehensive mental wellness/sobriety plan.   Patient Goals:  get better so I can go home  Discharge Plan or Barriers:  CSW to assist in the development of appropriate discharge plans.   Reason for Continuation of Hospitalization: Aggression Anxiety Depression Medication stabilization Suicidal ideation  Estimated Length of Stay:  1-7 days  Last 3 [place] Suicide Severity Risk Score: Flowsheet Row Admission (Current) from 10/28/2024 in [facility] ED from 10/27/2024 in [facility]  from 09/23/2024 in [facility] Emergency Department at [facility]  C-SSRS RISK CATEGORY High Risk High Risk No Risk    Last PHQ 2/9 Scores:    09/24/2021   11:36 AM 09/19/2021    1:07 PM 08/20/2021   11:35 AM  Depression screen PHQ 2/9  Decreased Interest 3 0 0  Down, Depressed, Hopeless 3 0 0  PHQ - 2 Score 6 0 0  Altered sleeping 1 0 0  Tired, decreased energy 1 0 0  Change in appetite 0 0 0  Feeling bad or failure about yourself  3 0 0  Trouble concentrating 0 0 0  Moving slowly or fidgety/restless 0 0 0  Suicidal thoughts 1 0 0  PHQ-9 Score 12  0  0   Difficult doing work/chores Extremely dIfficult Not difficult at all Not difficult at all     Data saved with a previous flowsheet row definition    Scribe for Treatment Team: Sherryle JINNY Margo, KEN 10/31/2024 3:35 PM

## 2024-10-31 NOTE — Group Note (Signed)
 Recreation Therapy Group Note   Group Topic:Self-Esteem  Group Date: 10/31/2024 Start Time: 1100 End Time: 1140 Facilitators: Celestia Jeoffrey BRAVO, LRT, CTRS Location: [facility]  Group Description: Positive Affirmation Worksheet. Patients and LRT discussed the importance of self-love/self-esteem and things that cause it to fluctuate, including our mental health. Patients completed a worksheet that helps them identify 24 different strengths and qualities about themselves. Pt encouraged to read aloud at least 3 off their sheet to the group. LRT and pts discussed how this can be applied to daily life post-discharge.   Goal Area(s) Addressed: Patient will identify positive qualities about themselves. Patient will learn new positive affirmations.  Patient will recite positive qualities and affirmations aloud to the group.  Patient will practice positive self-talk.  Patient will increase communication.   Affect/Mood: Appropriate   Participation Level: Moderate    Clinical Observations/Individualized Feedback: Randall Harvey was somewhat active in their participation of session activities and group discussion. Pt identified I am good at making people laugh. What I like about my appearance is that I got a hair cut.    Plan: Continue to engage patient in RT group sessions 2-3x/week.   Jeoffrey BRAVO Celestia, LRT, CTRS 10/31/2024 11:59 AM

## 2024-10-31 NOTE — Plan of Care (Signed)
   Problem: Education: Goal: Knowledge of [facility] Education information/materials will improve Outcome: Progressing Goal: Emotional status will improve Outcome: Progressing Goal: Mental status will improve Outcome: Progressing Goal: Verbalization of understanding the information provided will improve Outcome: Progressing

## 2024-10-31 NOTE — Progress Notes (Signed)
" °   10/31/24 0800  Psych Admission Type (Psych Patients Only)  Admission Status Involuntary  Psychosocial Assessment  Patient Complaints Anxiety;Depression  Eye Contact Fair  Facial Expression Anxious  Affect Apprehensive  Speech Logical/coherent  Interaction Assertive  Motor Activity Slow  Appearance/Hygiene In scrubs  Behavior Characteristics Cooperative  Mood Anxious  Thought Process  Coherency WDL  Content Preoccupation  Delusions None reported or observed  Perception WDL  Hallucination None reported or observed  Judgment Impaired  Confusion None  Danger to Self  Current suicidal ideation? Denies  Danger to Others  Danger to Others None reported or observed    "

## 2024-10-31 NOTE — Plan of Care (Signed)
  Problem: Education: Goal: Emotional status will improve Outcome: Progressing   Problem: Education: Goal: Mental status will improve Outcome: Progressing   Problem: Education: Goal: Verbalization of understanding the information provided will improve Outcome: Progressing   

## 2024-10-31 NOTE — Group Note (Signed)
"                                                 [facility] LCSW Group Therapy Note    Group Date: 10/31/2024 Start Time: 1300 End Time: 1345  Type of Therapy and Topic:  Group Therapy:  Overcoming Obstacles  Participation Level:  [facility] PARTICIPATION LEVEL: Active  Description of Group:   In this group patients will be encouraged to explore what they see as obstacles to their own wellness and recovery. They will be guided to discuss their thoughts, feelings, and behaviors related to these obstacles. The group will process together ways to cope with barriers, with attention given to specific choices patients can make. Each patient will be challenged to identify changes they are motivated to make in order to overcome their obstacles. This group will be process-oriented, with patients participating in exploration of their own experiences as well as giving and receiving support and challenge from other group members.  Therapeutic Goals: 1. Patient will identify personal and current obstacles as they relate to admission. 2. Patient will identify barriers that currently interfere with their wellness or overcoming obstacles.  3. Patient will identify feelings, thought process and behaviors related to these barriers. 4. Patient will identify two changes they are willing to make to overcome these obstacles:    Summary of Patient Progress Patient was present for the part of the group process. He was actively engaged in the discussion and his comments helped further the conversation. Insight into himself and the topic appeared questionable. He appeared open and receptive to feedback/comments from both his peers and the facilitator.  Therapeutic Modalities:   Cognitive Behavioral Therapy Solution Focused Therapy Motivational Interviewing Relapse Prevention Therapy   [name], LCSW "

## 2024-11-01 NOTE — Progress Notes (Signed)
" °   11/01/24 0930  Psych Admission Type (Psych Patients Only)  Admission Status Involuntary  Psychosocial Assessment  Patient Complaints Anxiety;Depression  Eye Contact Fair  Facial Expression Anxious  Affect Apprehensive  Speech Logical/coherent  Interaction Assertive  Motor Activity Slow  Appearance/Hygiene In scrubs  Behavior Characteristics Cooperative  Mood Anxious  Thought Process  Coherency WDL  Content Preoccupation  Delusions None reported or observed  Perception WDL  Hallucination None reported or observed  Judgment Impaired  Confusion None  Danger to Self  Current suicidal ideation? Denies  Danger to Others  Danger to Others None reported or observed    "

## 2024-11-01 NOTE — Plan of Care (Signed)
  Problem: Education: Goal: Knowledge of [facility] Education information/materials will improve Outcome: Progressing Goal: Emotional status will improve Outcome: Progressing Goal: Mental status will improve Outcome: Progressing Goal: Verbalization of understanding the information provided will improve Outcome: Progressing   Problem: Activity: Goal: Interest or engagement in activities will improve Outcome: Progressing Goal: Sleeping patterns will improve Outcome: Progressing   Problem: Coping: Goal: Ability to verbalize frustrations and anger appropriately will improve Outcome: Progressing Goal: Ability to demonstrate self-control will improve Outcome: Progressing   Problem: Health Behavior/Discharge Planning: Goal: Identification of resources available to assist in meeting health care needs will improve Outcome: Progressing Goal: Compliance with treatment plan for underlying cause of condition will improve Outcome: Progressing   Problem: Physical Regulation: Goal: Ability to maintain clinical measurements within normal limits will improve Outcome: Progressing   Problem: Safety: Goal: Periods of time without injury will increase Outcome: Progressing   Problem: Education: Goal: Utilization of techniques to improve thought processes will improve Outcome: Progressing Goal: Knowledge of the prescribed therapeutic regimen will improve Outcome: Progressing   Problem: Activity: Goal: Interest or engagement in leisure activities will improve Outcome: Progressing Goal: Imbalance in normal sleep/wake cycle will improve Outcome: Progressing   Problem: Coping: Goal: Coping ability will improve Outcome: Progressing Goal: Will verbalize feelings Outcome: Progressing   Problem: Health Behavior/Discharge Planning: Goal: Ability to make decisions will improve Outcome: Progressing Goal: Compliance with therapeutic regimen will improve Outcome: Progressing    Problem: Role Relationship: Goal: Will demonstrate positive changes in social behaviors and relationships Outcome: Progressing   Problem: Safety: Goal: Ability to disclose and discuss suicidal ideas will improve Outcome: Progressing Goal: Ability to identify and utilize support systems that promote safety will improve Outcome: Progressing   Problem: Self-Concept: Goal: Will verbalize positive feelings about self Outcome: Progressing Goal: Level of anxiety will decrease Outcome: Progressing   Problem: Education: Goal: Ability to make informed decisions regarding treatment will improve Outcome: Progressing   Problem: Coping: Goal: Coping ability will improve Outcome: Progressing   Problem: Health Behavior/Discharge Planning: Goal: Identification of resources available to assist in meeting health care needs will improve Outcome: Progressing   Problem: Medication: Goal: Compliance with prescribed medication regimen will improve Outcome: Progressing   Problem: Self-Concept: Goal: Ability to disclose and discuss suicidal ideas will improve Outcome: Progressing   Problem: Education: Goal: Ability to state activities that reduce stress will improve Outcome: Progressing   Problem: Coping: Goal: Ability to identify and develop effective coping behavior will improve Outcome: Progressing   Problem: Self-Concept: Goal: Ability to identify factors that promote anxiety will improve Outcome: Progressing Goal: Level of anxiety will decrease Outcome: Progressing Goal: Ability to modify response to factors that promote anxiety will improve Outcome: Progressing

## 2024-11-01 NOTE — [facility] Counselor (Signed)
 CSW met with the patient to discuss aftercare.  Patient declined referrals for inpatient or residential, stating he wanted outpatient.    CSW followed up on transportation and communication device barriers to appointments.   CSW reported that she will follow up with the pts sister to see if she can provide transportation and telephone number for appointments.  Pt agreed.   CSW called the patient's sister, however had to leave HIPAA compliant voicemail.  Sherryle Margo, MSW, LCSW 11/01/2024 3:38 PM
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
## 2024-11-01 NOTE — Plan of Care (Signed)
   Problem: Education: Goal: Emotional status will improve Outcome: Progressing Goal: Mental status will improve Outcome: Progressing

## 2024-11-01 NOTE — Group Note (Signed)
 Date:  11/01/2024 Time:  8:36 PM  Group Topic/Focus:  Wrap-Up Group:   The focus of this group is to help patients review their daily goal of treatment and discuss progress on daily workbooks.    Participation Level:  Active  Participation Quality:  Appropriate and Attentive  Affect:  Appropriate  Cognitive:  Alert and Appropriate  Insight: Appropriate  Engagement in Group:  Engaged  Modes of Intervention:  Discussion and Support  Additional Comments:    Ginny JONETTA Galeazzi 11/01/2024, 8:36 PM

## 2024-11-01 NOTE — Progress Notes (Signed)
" °   10/31/24 2300  Psych Admission Type (Psych Patients Only)  Admission Status Involuntary  Psychosocial Assessment  Patient Complaints Anxiety;Depression  Eye Contact Fair  Facial Expression Anxious  Affect Apprehensive  Speech Logical/coherent  Interaction Assertive  Motor Activity Slow  Appearance/Hygiene In scrubs  Behavior Characteristics Cooperative  Mood Anxious  Aggressive Behavior  Effect No apparent injury  Thought Process  Coherency WDL  Content Preoccupation  Delusions None reported or observed  Perception WDL  Hallucination None reported or observed  Judgment Impaired  Confusion None  Danger to Self  Current suicidal ideation? Denies  Danger to Others  Danger to Others None reported or observed    "

## 2024-11-02 MED ORDER — ESCITALOPRAM OXALATE 10 MG PO TABS: 10.0000 mg | ORAL_TABLET | Freq: Every day | ORAL | 0 refills | Status: AC

## 2024-11-02 MED ORDER — NICOTINE 21 MG/24HR TD PT24: 21.0000 mg | MEDICATED_PATCH | Freq: Every day | TRANSDERMAL | 0 refills | Status: AC

## 2024-11-02 NOTE — [facility] Suicide Risk Assessment (Cosign Needed)
 [facility] Discharge Suicide Risk Assessment   Principal Problem: MDD (major depressive disorder), recurrent severe, without psychosis (HCC) Discharge Diagnoses: Principal Problem:   MDD (major depressive disorder), recurrent severe, without psychosis (HCC)   Total Time spent with patient: 1 hour  Musculoskeletal: Strength & Muscle Tone: within normal limits Gait & Station: uses walker Patient leans: N/A  Psychiatric Specialty Exam  Presentation  General Appearance:  Appropriate for Environment; Casual  Eye Contact: Good  Speech: Clear and Coherent; Normal Rate  Speech Volume: Normal  Handedness: Right   Mood and Affect  Mood: Euthymic  Duration of Depression Symptoms: N/A  Affect: Appropriate; Congruent   Thought Process  Thought Processes: Coherent; Linear  Descriptions of Associations:Intact  Orientation:Full (Time, Place and Person)  Thought Content:Logical  History of Schizophrenia/Schizoaffective disorder:No  Duration of Psychotic Symptoms:No data recorded Hallucinations:Hallucinations: None  Ideas of Reference:None  Suicidal Thoughts:Suicidal Thoughts: No  Homicidal Thoughts:Homicidal Thoughts: No   Sensorium  Memory: Immediate Fair; Recent Fair; Remote Fair  Judgment: Fair  Insight: Fair   Art Therapist  Concentration: Fair  Attention Span: Fair  Recall: [employer] of Knowledge: Fair  Language: Fair   Psychomotor Activity  Psychomotor Activity: Psychomotor Activity: Normal   Assets  Assets: Communication Skills; Desire for Improvement   Sleep  Sleep: Sleep: Fair  Estimated Sleeping Duration (Last 24 Hours): 6.00-8.25 hours  Physical Exam: Physical Exam Vitals and nursing note reviewed.  Constitutional:      Appearance: Normal appearance.  Pulmonary:     Effort: Pulmonary effort is normal.  Neurological:     Mental Status: He is alert and oriented to person, place, and time.  Psychiatric:         Mood and Affect: Mood normal.        Behavior: Behavior normal.        Thought Content: Thought content normal.    Review of Systems  Respiratory:  Negative for shortness of breath.   Cardiovascular:  Negative for chest pain.  Gastrointestinal:  Negative for diarrhea, nausea and vomiting.  Psychiatric/Behavioral:  Positive for substance abuse. Negative for depression, hallucinations and suicidal ideas. The patient is not nervous/anxious.   All other systems reviewed and are negative.  Blood pressure 131/86, pulse 92, temperature 98 F (36.7 C), temperature source Oral, resp. rate 18, height 5' 7 (1.702 m), weight 73.5 kg, SpO2 97%. Body mass index is 25.37 kg/m.  Mental Status Per Nursing Assessment::   On Admission:  NA, Suicidal ideation indicated by others  Demographic Factors:  Male, Age 25 or older, Divorced or widowed, Caucasian, Low socioeconomic status, and Unemployed  Loss Factors: NA  Historical Factors: Impulsivity  Risk Reduction Factors:   Living with another person, especially a relative  Continued Clinical Symptoms:  Alcohol/Substance Abuse/Dependencies Previous Psychiatric Diagnoses and Treatments  Cognitive Features That Contribute To Risk:  None    Suicide Risk:  Minimal: No identifiable suicidal ideation.  Patients presenting with no risk factors but with morbid ruminations; Donnesha Karg be classified as minimal risk based on the severity of the depressive symptoms   Follow-up Information     [facility] Follow up.   Why: In person assessment for therapy and psychiatry is 11/07/24 at 9 AM. Contact information: [address] [place] [address] [phone]                 Plan Of Care/Follow-up recommendations:  Follow up with appointments listed above.   Helton Oleson, NP 11/02/2024, 10:36 PM

## 2024-11-02 NOTE — Group Note (Signed)
 Date:  11/02/2024 Time:  8:56 PM  Group Topic/Focus:  Wrap-Up Group:   The focus of this group is to help patients review their daily goal of treatment and discuss progress on daily workbooks.    Participation Level:  Active  Participation Quality:  Appropriate  Affect:  Appropriate  Cognitive:  Appropriate  Insight: Good  Engagement in Group:  Engaged  Modes of Intervention:  Activity   [name] Getting 11/02/2024, 8:56 PM

## 2024-11-02 NOTE — Plan of Care (Signed)
   Problem: Education: Goal: Emotional status will improve Outcome: Progressing Goal: Mental status will improve Outcome: Progressing Goal: Verbalization of understanding the information provided will improve Outcome: Progressing   Problem: Activity: Goal: Interest or engagement in activities will improve Outcome: Progressing

## 2024-11-02 NOTE — Plan of Care (Signed)
  Problem: Education: Goal: Knowledge of [facility] Education information/materials will improve Outcome: Progressing Goal: Emotional status will improve Outcome: Progressing Goal: Mental status will improve Outcome: Progressing Goal: Verbalization of understanding the information provided will improve Outcome: Progressing   Problem: Activity: Goal: Interest or engagement in activities will improve Outcome: Progressing Goal: Sleeping patterns will improve Outcome: Progressing   Problem: Coping: Goal: Ability to verbalize frustrations and anger appropriately will improve Outcome: Progressing Goal: Ability to demonstrate self-control will improve Outcome: Progressing   Problem: Health Behavior/Discharge Planning: Goal: Identification of resources available to assist in meeting health care needs will improve Outcome: Progressing Goal: Compliance with treatment plan for underlying cause of condition will improve Outcome: Progressing   Problem: Physical Regulation: Goal: Ability to maintain clinical measurements within normal limits will improve Outcome: Progressing   Problem: Safety: Goal: Periods of time without injury will increase Outcome: Progressing   Problem: Education: Goal: Utilization of techniques to improve thought processes will improve Outcome: Progressing Goal: Knowledge of the prescribed therapeutic regimen will improve Outcome: Progressing   Problem: Activity: Goal: Interest or engagement in leisure activities will improve Outcome: Progressing Goal: Imbalance in normal sleep/wake cycle will improve Outcome: Progressing   Problem: Coping: Goal: Coping ability will improve Outcome: Progressing Goal: Will verbalize feelings Outcome: Progressing   Problem: Health Behavior/Discharge Planning: Goal: Ability to make decisions will improve Outcome: Progressing Goal: Compliance with therapeutic regimen will improve Outcome: Progressing    Problem: Role Relationship: Goal: Will demonstrate positive changes in social behaviors and relationships Outcome: Progressing   Problem: Safety: Goal: Ability to disclose and discuss suicidal ideas will improve Outcome: Progressing Goal: Ability to identify and utilize support systems that promote safety will improve Outcome: Progressing   Problem: Self-Concept: Goal: Will verbalize positive feelings about self Outcome: Progressing Goal: Level of anxiety will decrease Outcome: Progressing   Problem: Education: Goal: Ability to make informed decisions regarding treatment will improve Outcome: Progressing   Problem: Coping: Goal: Coping ability will improve Outcome: Progressing   Problem: Health Behavior/Discharge Planning: Goal: Identification of resources available to assist in meeting health care needs will improve Outcome: Progressing   Problem: Medication: Goal: Compliance with prescribed medication regimen will improve Outcome: Progressing   Problem: Self-Concept: Goal: Ability to disclose and discuss suicidal ideas will improve Outcome: Progressing   Problem: Education: Goal: Ability to state activities that reduce stress will improve Outcome: Progressing   Problem: Coping: Goal: Ability to identify and develop effective coping behavior will improve Outcome: Progressing   Problem: Self-Concept: Goal: Ability to identify factors that promote anxiety will improve Outcome: Progressing Goal: Level of anxiety will decrease Outcome: Progressing Goal: Ability to modify response to factors that promote anxiety will improve Outcome: Progressing

## 2024-11-02 NOTE — Group Note (Signed)
 [facility] LCSW Group Therapy Note   Group Date: 11/02/2024 Start Time: 1300 End Time: 1400   Type of Therapy/Topic:  Group Therapy:  Emotion Regulation  Participation Level:  None   Mood:  Description of Group:    The purpose of this group is to assist patients in learning to regulate negative emotions and experience positive emotions. Patients will be guided to discuss ways in which they have been vulnerable to their negative emotions. These vulnerabilities will be juxtaposed with experiences of positive emotions or situations, and patients challenged to use positive emotions to combat negative ones. Special emphasis will be placed on coping with negative emotions in conflict situations, and patients will process healthy conflict resolution skills.  Therapeutic Goals: Patient will identify two positive emotions or experiences to reflect on in order to balance out negative emotions:  Patient will label two or more emotions that they find the most difficult to experience:  Patient will be able to demonstrate positive conflict resolution skills through discussion or role plays:   Summary of Patient Progress: Patient attended group.  Patient was interruptive in group and required redirection. Patient was inappropriate throughout.  Comments made by patient were not therapeutic in nature but were related to the subject.  Insight was poor.  Therapeutic Modalities:   Cognitive Behavioral Therapy Feelings Identification Dialectical Behavioral Therapy   Sherryle JINNY Margo, LCSW

## 2024-11-02 NOTE — [facility] Counselor (Signed)
 CSW touched base with Arland Qua, sister, [phone] to engage in safe discharge planning and work to coordinate transportation to patient's appointment.   Sister reported that she is unwilling to provide transportation at discharge or to appointment as I'm sick as well and he never follows through.  Sister reported that the patient is know to not keep his appointments.  CSW made patient appointment with [facility] and will assess if [facility] can help with transportation.   CSW to continue to assess.   Richardson Dubree, MSW, LCSWA 11/02/2024 11:00 AM

## 2024-11-02 NOTE — BH Assessment (Signed)
 Patient is pleasant. Ciwa- 0. Patient denies si,hi and avh.

## 2024-11-02 NOTE — Discharge Summary (Incomplete)
 " Physician Discharge Summary Note  Patient:  Randall Harvey. is an 64 y.o., male MRN:  [id number] DOB:  April 10, 1961 Patient phone:  [phone] (home)  Patient address:   [address] [place] [address],   Total time spent: 40 min Date of Admission:  10/28/2024 Date of Discharge: ***  Reason for Admission:  ***  Principal Problem: MDD (major depressive disorder), recurrent severe, without psychosis (HCC) Discharge Diagnoses: Principal Problem:   MDD (major depressive disorder), recurrent severe, without psychosis (HCC)   Past Psychiatric History: see h&p  Family Psychiatric  History: see h&p Social History:  Social History   Substance and Sexual Activity  Alcohol Use Yes   Alcohol/week: 12.0 standard drinks of alcohol   Types: 12 Cans of beer per week   Comment: alcohol abuse     Social History   Substance and Sexual Activity  Drug Use Not Currently   Types: Marijuana   Comment: occ    Social History   Socioeconomic History   Marital status: Divorced    Spouse name: Not on file   Number of children: Not on file   Years of education: Not on file   Highest education level: Not on file  Occupational History   Not on file  Tobacco Use   Smoking status: Every Day    Current packs/day: 0.50    Average packs/day: 0.5 packs/day for 56.1 years (28.0 ttl pk-yrs)    Types: Cigarettes    Start date: 1970   Smokeless tobacco: Never  Vaping Use   Vaping status: Never Used  Substance and Sexual Activity   Alcohol use: Yes    Alcohol/week: 12.0 standard drinks of alcohol    Types: 12 Cans of beer per week    Comment: alcohol abuse   Drug use: Not Currently    Types: Marijuana    Comment: occ   Sexual activity: Yes  Other Topics Concern   Not on file  Social History Narrative   ** Merged History Encounter **       Social Drivers of Health   Tobacco Use: High Risk (10/28/2024)   Patient History    Smoking Tobacco Use: Every Day    Smokeless Tobacco Use:  Never    Passive Exposure: Not on file  Financial Resource Strain: Not on file  Food Insecurity: Food Insecurity Present (10/28/2024)   Epic    Worried About Programme Researcher, Broadcasting/film/video in the Last Year: Sometimes true    Barista in the Last Year: Sometimes true  Transportation Needs: Unmet Transportation Needs (10/28/2024)   Epic    Lack of Transportation (Medical): Yes    Lack of Transportation (Non-Medical): Yes  Physical Activity: Not on file  Stress: Not on file  Social Connections: Patient Declined (10/28/2024)   Social Connection and Isolation Panel    Frequency of Communication with Friends and Family: Patient declined    Frequency of Social Gatherings with Friends and Family: Patient declined    Attends Religious Services: Patient declined    Active Member of Clubs or Organizations: Patient declined    Attends Banker Meetings: Patient declined    Marital Status: Patient declined  Depression (PHQ2-9): Not on file  Alcohol Screen: Medium Risk (10/28/2024)   Alcohol Screen    Last Alcohol Screening Score (AUDIT): 9  Housing: Low Risk (10/28/2024)   Epic    Unable to Pay for Housing in the Last Year: No    Number of  Times Moved in the Last Year: 0    Homeless in the Last Year: No  Utilities: Not At Risk (10/28/2024)   Epic    Threatened with loss of utilities: No  Health Literacy: Not on file   Past Medical History:  Past Medical History:  Diagnosis Date   Alcohol intoxication    Anticoagulant long-term use    lifetime use, coumadin therapy, then Xarelto    Anxiety    Arthritis    Cancer (HCC)    renal   Carotid atherosclerosis    <50% left and right   Clotting disorder    COPD (chronic obstructive pulmonary disease) (HCC)    Depression    Elevated liver enzymes 11/2013   Family history of cancer    Fatty liver    H/O drug abuse (HCC)    H/O ETOH abuse    Hepatitis    History of traumatic brain injury    Hypertension    Lymphadenopathy    Obesity     Personal history of DVT (deep vein thrombosis) 06/2013   Pre-diabetes    Primary osteoarthritis of left knee    Pulmonary embolism and infarction (HCC) 06/2013   Pulmonary embolism and infarction (HCC) 11/2013   Pulmonary embolus with infarction [facility])    Renal cell carcinoma (HCC)    Renal mass, left 06/2013   with ureteral obstruction   Stroke (HCC)    Subarachnoid hemorrhage following injury (HCC)    Thrombocythemia    Thrombocytopenia    Tobacco abuse     Past Surgical History:  Procedure Laterality Date   ANKLE FRACTURE SURGERY Right 12/2012   ANKLE SURGERY     EXCISION METACARPAL MASS Right 09/18/2016   Procedure: EXCISION METACARPAL MASS;  Surgeon: Ozell Flake, MD;  Location: [facility] ORS;  Service: Orthopedics;  Laterality: Right;  5th digit    HERNIA REPAIR     Umbilical Hernia   ORBITAL FRACTURE SURGERY Left    ROBOTIC ASSITED PARTIAL NEPHRECTOMY Left Oct 2014   Family History:  Family History  Problem Relation Age of Onset   Pancreatic cancer Mother    Colon cancer Father    Diabetes Father    Colon cancer Sister    COPD Sister    Seizures Sister    Ovarian cancer Sister    Cancer Paternal Grandmother        bone   Stroke Paternal Grandfather    Heart disease Paternal Grandfather    Hypertension Neg Hx     Hospital Course:  ***  On admission,  Detailed risk assessment is complete based on clinical exam and individual risk factors and acute suicide risk is low and acute violence risk is low.    On the day of discharge, patient denies SI/HI/plan and denies hallucinations.  Patient remains future oriented and is willing to participate in outpatient mental health services.  Currently, all modifiable risk of harm to self/harm to others have been addressed and patient is no longer appropriate for the acute inpatient setting and is able to continue treatment for mental health needs in the community with the supports as indicated below.  Patient is educated and  verbalized understanding of discharge plan of care including medications, follow-up appointments, mental health resources and further crisis services in the community.  He is instructed to call 911 or present to the nearest emergency room should he experience any decompensation in mood, disturbance of bowel or return of suicidal/homicidal ideations.  Patient verbalizes understanding of this education and  agrees to this plan of care  Physical Findings: AIMS:  , ,  ,  ,    CIWA:  CIWA-Ar Total: 1 COWS:      Psychiatric Specialty Exam:  Presentation  General Appearance:  Appropriate for Environment; Casual  Eye Contact: Good  Speech: Clear and Coherent; Normal Rate  Speech Volume: Normal    Mood and Affect  Mood: Euthymic  Affect: Appropriate; Congruent   Thought Process  Thought Processes: Coherent; Linear  Descriptions of Associations:Intact  Orientation:Full (Time, Place and Person)  Thought Content:Logical  Hallucinations:Hallucinations: None  Ideas of Reference:None  Suicidal Thoughts:Suicidal Thoughts: No  Homicidal Thoughts:Homicidal Thoughts: No   Sensorium  Memory: Immediate Fair; Recent Fair; Remote Fair  Judgment: Poor  Insight: Fair   Chartered Certified Accountant: Fair  Attention Span: Fair  Recall: [employer] of Knowledge: Fair  Language: Fair   Psychomotor Activity  Psychomotor Activity: Psychomotor Activity: Normal  Musculoskeletal: Strength & Muscle Tone: {desc; muscle tone:32375} Gait & Station: {PE GAIT ED WJUO:77474} Assets  Assets: Communication Skills; Desire for Improvement   Sleep  Sleep: Sleep: Fair    Physical Exam: Physical Exam ROS Blood pressure 131/86, pulse 92, temperature 98 F (36.7 C), temperature source Oral, resp. rate 18, height 5' 7 (1.702 m), weight 73.5 kg, SpO2 97%. Body mass index is 25.37 kg/m.   Tobacco Use History[1] Tobacco Cessation:  {Discharge tobacco  cessation prescription:304700209}   Blood Alcohol level:  Lab Results  Component Value Date   ETH 290 (H) 10/27/2024   ETH 299 (H) 06/21/2024    Metabolic Disorder Labs:  Lab Results  Component Value Date   HGBA1C 5.5 10/29/2024   MPG 111.15 10/29/2024   MPG 111.15 04/04/2024   No results found for: PROLACTIN Lab Results  Component Value Date   CHOL 151 10/29/2024   TRIG 130 10/29/2024   HDL 52 10/29/2024   CHOLHDL 2.9 10/29/2024   VLDL 26 10/29/2024   LDLCALC 73 10/29/2024   LDLCALC 83 04/04/2024    See Psychiatric Specialty Exam and Suicide Risk Assessment completed by Attending Physician prior to discharge.  Discharge destination:  {DISCHARGE DESTINATION:22616}  Is patient on multiple antipsychotic therapies at discharge:  {RECOMMEND TAPERING:22617}   Has Patient had three or more failed trials of antipsychotic monotherapy by history:  {[facility] ANTIPSYCHOTIC:22903}  Recommended Plan for Multiple Antipsychotic Therapies: {[facility] MULTIPLE ANTIPSYCHOTIC THERAPIES:22905}  Discharge Instructions     Increase activity slowly   Complete by: As directed       Allergies as of 11/02/2024   No Known Allergies      Medication List     STOP taking these medications    DULoxetine  30 MG capsule Commonly known as: CYMBALTA    mirtazapine  7.5 MG tablet Commonly known as: REMERON    nicotine  14 mg/24hr patch Commonly known as: NICODERM CQ  - dosed in mg/24 hours Replaced by: nicotine  21 mg/24hr patch   thiamine  100 MG tablet Commonly known as: Vitamin B-1   traZODone  50 MG tablet Commonly known as: DESYREL        TAKE these medications      Indication  escitalopram  10 MG tablet Commonly known as: LEXAPRO  Take 1 tablet (10 mg total) by mouth daily. Start taking on: November 03, 2024  Indication: Major Depressive Disorder   nicotine  21 mg/24hr patch Commonly known as: NICODERM CQ  - dosed in mg/24 hours Place 1 patch (21 mg total) onto the skin daily. Start  taking on: November 03, 2024 Replaces: nicotine  14 mg/24hr  patch  Indication: Nicotine  Addiction        Follow-up Information     [facility] Follow up.   Why: In person assessment for therapy and psychiatry is 11/07/24 at 9 AM. Contact information: [address] [place] [address] [phone]                 Follow-up recommendations:  {[facility] DC FU RECOMMENDATIONS:22620}    Signed: Wynona Duhamel, NP 11/02/2024, 10:38 PM           [1]  Social History Tobacco Use  Smoking Status Every Day   Current packs/day: 0.50   Average packs/day: 0.5 packs/day for 56.1 years (28.0 ttl pk-yrs)   Types: Cigarettes   Start date: 1970  Smokeless Tobacco Never   "

## 2024-11-02 NOTE — Group Note (Signed)
 Date:  11/02/2024 Time:  11:10 AM  Group Topic/Focus:  Goals Group:   The focus of this group is to help patients establish daily goals to achieve during treatment and discuss how the patient can incorporate goal setting into their daily lives to aide in recovery.    Participation Level:  Active  Participation Quality:  Appropriate  Affect:  Appropriate  Cognitive:  Alert  Insight: Appropriate  Engagement in Group:  Engaged  Modes of Intervention:  Activity, Discussion, and Education  Additional Comments:    Randall Harvey 11/02/2024, 11:10 AM

## 2024-11-02 NOTE — Progress Notes (Signed)
" °   11/01/24 2300  Psych Admission Type (Psych Patients Only)  Admission Status Involuntary  Psychosocial Assessment  Patient Complaints Anxiety;Depression  Eye Contact Fair  Facial Expression Anxious  Affect Appropriate to circumstance;Apprehensive  Speech Logical/coherent  Interaction Assertive  Motor Activity Slow  Appearance/Hygiene In scrubs  Behavior Characteristics Cooperative  Mood Anxious  Thought Process  Coherency WDL  Content Preoccupation  Delusions None reported or observed  Perception WDL  Hallucination None reported or observed  Judgment Impaired  Confusion None  Danger to Self  Current suicidal ideation? Denies    "

## 2024-11-02 NOTE — Progress Notes (Signed)
 Pt presents calm and pleasant during assessment denying SI/HI/AVH. Pt observed by this clinical research associate interacting appropriately with staff and peers on the unit. Pt compliant with medication administration per MD orders. Pt given education, support, and encouragement to be active in his treatment plan. Pt being monitored Q 15 minutes for safety per unit protocol, remains safe on the unit

## 2024-11-02 NOTE — BHH Counselor (Signed)
 CSW contacted [facility] at [facility] and spoke with Megan [phone]). CSW was informed that pt had not been there since 2023 and would need a new referral. CSW was transferred to the referral line and this process was explained to him. CSW made aware that pt had not shown up to any of his appointments and would need an order for ambulatory referral to hematology and oncology and that it would need to be for the [facility]. No other concerns expressed. Contact ended without incident.   Provider notified via secure chat.   Nadara SAUNDERS. Chaim, MSW, LCSW, LCAS 11/02/2024 4:44 PM

## 2024-11-03 NOTE — Plan of Care (Signed)
   Problem: Education: Goal: Emotional status will improve Outcome: Progressing Goal: Mental status will improve Outcome: Progressing

## 2024-11-03 NOTE — Plan of Care (Signed)
" °  Problem: Education: Goal: Knowledge of [facility] Education information/materials will improve Outcome: Adequate for Discharge Goal: Emotional status will improve Outcome: Adequate for Discharge Goal: Mental status will improve Outcome: Adequate for Discharge Goal: Verbalization of understanding the information provided will improve Outcome: Adequate for Discharge   Problem: Activity: Goal: Interest or engagement in activities will improve Outcome: Adequate for Discharge Goal: Sleeping patterns will improve Outcome: Adequate for Discharge   Problem: Coping: Goal: Ability to verbalize frustrations and anger appropriately will improve Outcome: Adequate for Discharge Goal: Ability to demonstrate self-control will improve Outcome: Adequate for Discharge   Problem: Health Behavior/Discharge Planning: Goal: Identification of resources available to assist in meeting health care needs will improve Outcome: Adequate for Discharge Goal: Compliance with treatment plan for underlying cause of condition will improve Outcome: Adequate for Discharge   Problem: Physical Regulation: Goal: Ability to maintain clinical measurements within normal limits will improve Outcome: Adequate for Discharge   Problem: Safety: Goal: Periods of time without injury will increase Outcome: Adequate for Discharge   Problem: Education: Goal: Utilization of techniques to improve thought processes will improve Outcome: Adequate for Discharge Goal: Knowledge of the prescribed therapeutic regimen will improve Outcome: Adequate for Discharge   Problem: Activity: Goal: Interest or engagement in leisure activities will improve Outcome: Adequate for Discharge Goal: Imbalance in normal sleep/wake cycle will improve Outcome: Adequate for Discharge   Problem: Coping: Goal: Coping ability will improve Outcome: Adequate for Discharge Goal: Will verbalize feelings Outcome: Adequate for Discharge    Problem: Health Behavior/Discharge Planning: Goal: Ability to make decisions will improve Outcome: Adequate for Discharge Goal: Compliance with therapeutic regimen will improve Outcome: Adequate for Discharge   Problem: Role Relationship: Goal: Will demonstrate positive changes in social behaviors and relationships Outcome: Adequate for Discharge   Problem: Safety: Goal: Ability to disclose and discuss suicidal ideas will improve Outcome: Adequate for Discharge Goal: Ability to identify and utilize support systems that promote safety will improve Outcome: Adequate for Discharge   Problem: Self-Concept: Goal: Will verbalize positive feelings about self Outcome: Adequate for Discharge Goal: Level of anxiety will decrease Outcome: Adequate for Discharge   Problem: Education: Goal: Ability to make informed decisions regarding treatment will improve Outcome: Adequate for Discharge   Problem: Coping: Goal: Coping ability will improve Outcome: Adequate for Discharge   Problem: Health Behavior/Discharge Planning: Goal: Identification of resources available to assist in meeting health care needs will improve Outcome: Adequate for Discharge   Problem: Medication: Goal: Compliance with prescribed medication regimen will improve Outcome: Adequate for Discharge   Problem: Self-Concept: Goal: Ability to disclose and discuss suicidal ideas will improve Outcome: Adequate for Discharge   Problem: Education: Goal: Ability to state activities that reduce stress will improve Outcome: Adequate for Discharge   Problem: Coping: Goal: Ability to identify and develop effective coping behavior will improve Outcome: Adequate for Discharge   Problem: Self-Concept: Goal: Ability to identify factors that promote anxiety will improve Outcome: Adequate for Discharge Goal: Level of anxiety will decrease Outcome: Adequate for Discharge Goal: Ability to modify response to factors that promote  anxiety will improve Outcome: Adequate for Discharge   "

## 2024-11-03 NOTE — Progress Notes (Signed)
" °   11/03/24 1000  Psych Admission Type (Psych Patients Only)  Admission Status Involuntary  Psychosocial Assessment  Patient Complaints Anxiety;Depression (patient stated I still got that, but didn't go into detail as to why he's feeling this way.)  Eye Contact Fair;Watchful  Facial Expression Worried  Affect Anxious;Preoccupied (preoccupied with his belongings and when he'll discharge)  Speech Soft  Interaction Assertive  Motor Activity Slow  Appearance/Hygiene Unremarkable  Behavior Characteristics Cooperative;Appropriate to situation  Mood Pleasant  Aggressive Behavior  Effect No apparent injury  Thought Process  Coherency WDL  Content Preoccupation  Delusions None reported or observed  Perception WDL  Hallucination None reported or observed  Judgment WDL  Confusion None  Danger to Self  Current suicidal ideation? Denies  Danger to Others  Danger to Others None reported or observed   Patient's goal for today, per his self-inventory is not drinking, in which he will think positive in order to achieve his goal. "

## 2024-11-03 NOTE — Progress Notes (Signed)
" °  [facility] Adult Case Management Discharge Plan :  Will you be returning to the same living situation after discharge:  Yes,  pt plans to return home upon discharge.  At discharge, do you have transportation home?: Yes,  pt received taxi voucher.  Do you have the ability to pay for your medications: Yes,  MEDICARE / MEDICARE PART A AND B.  Release of information consent forms completed and in the chart;  Patient's signature needed at discharge.  Patient to Follow up at:  Follow-up Information     [facility] Follow up.   Why: In person assessment for therapy and psychiatry is 11/07/24 at 9 AM. Contact information: [address] [place] [address] [phone]                 Next level of care provider has access to [facility] Link:no  Safety Planning and Suicide Prevention discussed: Yes,  SPE completed with sister, Arland Qua.     Has patient been referred to the Quitline?: Patient refused referral for treatment  Patient has been referred for addiction treatment: Yes, the patient will follow up with an outpatient provider for substance use disorder. Psychiatrist/APP: appointment made and Therapist: appointment made  [name], LCSW 11/03/2024, 9:32 AM "

## 2024-11-03 NOTE — Care Management Important Message (Signed)
 Important Message  Patient Details  Name: Randall Harvey. MRN: [id number] Date of Birth: [birth year]   Medicare Important Message Given:  Yes - Medicare IM     [name], LCSW 11/03/2024, 10:25 AM

## 2024-11-03 NOTE — Progress Notes (Signed)
 Patient ID: Randall Harvey., male   DOB: September 26, 1961, 64 y.o.   MRN: [id number]  Discharge Note:  Patient denies SI/HI/AVH at this time. Discharge instructions, AVS, prescriptions, and transition record gone over with patient. Patient declined on filling out a Suicide Safety Plan. Patient agrees to comply with medication management, follow-up visit, and outpatient therapy. Patient belongings returned to patient. Patient questions and concerns addressed and answered. Patient ambulatory wheeled unit. Patient discharged to home via [employer] services.
